# Patient Record
Sex: Female | Born: 1945 | Race: White | Hispanic: No | Marital: Married | Smoking: Never smoker
Health system: Southern US, Community
[De-identification: ages and names within clinical notes are randomized; demographics above are authoritative.]

## PROBLEM LIST (undated history)

## (undated) DIAGNOSIS — M1990 Unspecified osteoarthritis, unspecified site: Secondary | ICD-10-CM

## (undated) DIAGNOSIS — C801 Malignant (primary) neoplasm, unspecified: Secondary | ICD-10-CM

## (undated) DIAGNOSIS — I6529 Occlusion and stenosis of unspecified carotid artery: Secondary | ICD-10-CM

## (undated) DIAGNOSIS — Z8489 Family history of other specified conditions: Secondary | ICD-10-CM

## (undated) DIAGNOSIS — G4733 Obstructive sleep apnea (adult) (pediatric): Secondary | ICD-10-CM

## (undated) DIAGNOSIS — E876 Hypokalemia: Secondary | ICD-10-CM

## (undated) DIAGNOSIS — I10 Essential (primary) hypertension: Secondary | ICD-10-CM

## (undated) DIAGNOSIS — E662 Morbid (severe) obesity with alveolar hypoventilation: Secondary | ICD-10-CM

## (undated) DIAGNOSIS — F419 Anxiety disorder, unspecified: Secondary | ICD-10-CM

## (undated) DIAGNOSIS — E119 Type 2 diabetes mellitus without complications: Secondary | ICD-10-CM

## (undated) DIAGNOSIS — S0990XA Unspecified injury of head, initial encounter: Secondary | ICD-10-CM

## (undated) DIAGNOSIS — I509 Heart failure, unspecified: Secondary | ICD-10-CM

## (undated) DIAGNOSIS — R112 Nausea with vomiting, unspecified: Secondary | ICD-10-CM

## (undated) DIAGNOSIS — T8859XA Other complications of anesthesia, initial encounter: Secondary | ICD-10-CM

## (undated) DIAGNOSIS — M869 Osteomyelitis, unspecified: Secondary | ICD-10-CM

## (undated) DIAGNOSIS — I80209 Phlebitis and thrombophlebitis of unspecified deep vessels of unspecified lower extremity: Secondary | ICD-10-CM

## (undated) DIAGNOSIS — Z9289 Personal history of other medical treatment: Secondary | ICD-10-CM

## (undated) DIAGNOSIS — I272 Pulmonary hypertension, unspecified: Secondary | ICD-10-CM

## (undated) DIAGNOSIS — Z9889 Other specified postprocedural states: Secondary | ICD-10-CM

## (undated) DIAGNOSIS — T4145XA Adverse effect of unspecified anesthetic, initial encounter: Secondary | ICD-10-CM

## (undated) DIAGNOSIS — M4800 Spinal stenosis, site unspecified: Secondary | ICD-10-CM

## (undated) HISTORY — DX: Heart failure, unspecified: I50.9

## (undated) HISTORY — PX: COLONOSCOPY: SHX174

## (undated) HISTORY — DX: Obstructive sleep apnea (adult) (pediatric): G47.33

## (undated) HISTORY — DX: Occlusion and stenosis of unspecified carotid artery: I65.29

## (undated) HISTORY — DX: Essential (primary) hypertension: I10

## (undated) HISTORY — DX: Hypokalemia: E87.6

## (undated) HISTORY — DX: Pulmonary hypertension, unspecified: I27.20

## (undated) HISTORY — DX: Phlebitis and thrombophlebitis of unspecified deep vessels of unspecified lower extremity: I80.209

## (undated) HISTORY — DX: Morbid (severe) obesity due to excess calories: E66.01

## (undated) HISTORY — DX: Spinal stenosis, site unspecified: M48.00

## (undated) HISTORY — PX: EYE SURGERY: SHX253

## (undated) HISTORY — DX: Type 2 diabetes mellitus without complications: E11.9

## (undated) HISTORY — DX: Morbid (severe) obesity with alveolar hypoventilation: E66.2

## (undated) HISTORY — DX: Osteomyelitis, unspecified: M86.9

---

## 1981-07-20 HISTORY — PX: VAGINAL HYSTERECTOMY: SUR661

## 1998-01-23 ENCOUNTER — Other Ambulatory Visit: Admission: RE | Admit: 1998-01-23 | Discharge: 1998-01-23 | Payer: Self-pay | Admitting: Gynecology

## 1998-07-20 HISTORY — PX: OTHER SURGICAL HISTORY: SHX169

## 1998-09-18 ENCOUNTER — Encounter (HOSPITAL_COMMUNITY): Admission: RE | Admit: 1998-09-18 | Discharge: 1998-12-17 | Payer: Self-pay | Admitting: Internal Medicine

## 1998-11-06 ENCOUNTER — Inpatient Hospital Stay (HOSPITAL_COMMUNITY): Admission: EM | Admit: 1998-11-06 | Discharge: 1998-11-12 | Payer: Self-pay | Admitting: Gastroenterology

## 1998-11-06 ENCOUNTER — Encounter: Payer: Self-pay | Admitting: Gastroenterology

## 1999-02-21 ENCOUNTER — Encounter: Payer: Self-pay | Admitting: General Surgery

## 1999-02-21 ENCOUNTER — Ambulatory Visit (HOSPITAL_COMMUNITY): Admission: RE | Admit: 1999-02-21 | Discharge: 1999-02-21 | Payer: Self-pay | Admitting: General Surgery

## 1999-04-29 ENCOUNTER — Other Ambulatory Visit: Admission: RE | Admit: 1999-04-29 | Discharge: 1999-04-29 | Payer: Self-pay | Admitting: Gynecology

## 1999-04-29 ENCOUNTER — Encounter (INDEPENDENT_AMBULATORY_CARE_PROVIDER_SITE_OTHER): Payer: Self-pay | Admitting: Specialist

## 1999-06-19 ENCOUNTER — Encounter: Admission: RE | Admit: 1999-06-19 | Discharge: 1999-06-19 | Payer: Self-pay | Admitting: Gynecology

## 1999-06-19 ENCOUNTER — Encounter: Payer: Self-pay | Admitting: Gynecology

## 1999-06-23 ENCOUNTER — Encounter: Payer: Self-pay | Admitting: Gynecology

## 1999-06-23 ENCOUNTER — Encounter: Admission: RE | Admit: 1999-06-23 | Discharge: 1999-06-23 | Payer: Self-pay | Admitting: Gynecology

## 1999-07-10 ENCOUNTER — Encounter: Admission: RE | Admit: 1999-07-10 | Discharge: 1999-07-10 | Payer: Self-pay | Admitting: Hematology and Oncology

## 1999-07-10 ENCOUNTER — Encounter: Payer: Self-pay | Admitting: Hematology and Oncology

## 1999-07-17 ENCOUNTER — Ambulatory Visit (HOSPITAL_COMMUNITY): Admission: RE | Admit: 1999-07-17 | Discharge: 1999-07-17 | Payer: Self-pay | Admitting: General Surgery

## 1999-08-04 ENCOUNTER — Encounter: Admission: RE | Admit: 1999-08-04 | Discharge: 1999-08-04 | Payer: Self-pay | Admitting: General Surgery

## 1999-08-04 ENCOUNTER — Encounter: Payer: Self-pay | Admitting: General Surgery

## 1999-12-03 ENCOUNTER — Encounter: Admission: RE | Admit: 1999-12-03 | Discharge: 2000-03-02 | Payer: Self-pay | Admitting: Cardiology

## 2000-02-02 ENCOUNTER — Ambulatory Visit (HOSPITAL_COMMUNITY): Admission: RE | Admit: 2000-02-02 | Discharge: 2000-02-02 | Payer: Self-pay | Admitting: Gastroenterology

## 2000-03-31 ENCOUNTER — Encounter: Payer: Self-pay | Admitting: Hematology and Oncology

## 2000-03-31 ENCOUNTER — Encounter: Admission: RE | Admit: 2000-03-31 | Discharge: 2000-03-31 | Payer: Self-pay | Admitting: Hematology and Oncology

## 2000-04-29 ENCOUNTER — Other Ambulatory Visit: Admission: RE | Admit: 2000-04-29 | Discharge: 2000-04-29 | Payer: Self-pay | Admitting: Gynecology

## 2000-06-23 ENCOUNTER — Encounter: Payer: Self-pay | Admitting: Gynecology

## 2000-06-23 ENCOUNTER — Encounter: Admission: RE | Admit: 2000-06-23 | Discharge: 2000-06-23 | Payer: Self-pay | Admitting: Gynecology

## 2000-07-20 HISTORY — PX: CHOLECYSTECTOMY: SHX55

## 2000-09-14 ENCOUNTER — Encounter: Admission: RE | Admit: 2000-09-14 | Discharge: 2000-09-14 | Payer: Self-pay | Admitting: Hematology and Oncology

## 2000-09-14 ENCOUNTER — Encounter: Payer: Self-pay | Admitting: Hematology and Oncology

## 2001-04-04 ENCOUNTER — Ambulatory Visit (HOSPITAL_COMMUNITY): Admission: RE | Admit: 2001-04-04 | Discharge: 2001-04-04 | Payer: Self-pay | Admitting: Gastroenterology

## 2001-04-04 ENCOUNTER — Encounter: Payer: Self-pay | Admitting: Gastroenterology

## 2001-04-07 ENCOUNTER — Encounter (INDEPENDENT_AMBULATORY_CARE_PROVIDER_SITE_OTHER): Payer: Self-pay | Admitting: Specialist

## 2001-04-07 ENCOUNTER — Encounter: Payer: Self-pay | Admitting: Gastroenterology

## 2001-04-08 ENCOUNTER — Inpatient Hospital Stay (HOSPITAL_COMMUNITY): Admission: AD | Admit: 2001-04-08 | Discharge: 2001-04-11 | Payer: Self-pay | Admitting: Gastroenterology

## 2001-04-08 ENCOUNTER — Ambulatory Visit (HOSPITAL_COMMUNITY): Admission: RE | Admit: 2001-04-08 | Discharge: 2001-04-08 | Payer: Self-pay | Admitting: General Surgery

## 2001-04-08 ENCOUNTER — Encounter: Payer: Self-pay | Admitting: General Surgery

## 2001-06-08 ENCOUNTER — Other Ambulatory Visit: Admission: RE | Admit: 2001-06-08 | Discharge: 2001-06-08 | Payer: Self-pay | Admitting: Gynecology

## 2001-06-27 ENCOUNTER — Encounter: Admission: RE | Admit: 2001-06-27 | Discharge: 2001-06-27 | Payer: Self-pay | Admitting: Gynecology

## 2001-06-27 ENCOUNTER — Encounter: Payer: Self-pay | Admitting: Gynecology

## 2002-06-13 ENCOUNTER — Other Ambulatory Visit: Admission: RE | Admit: 2002-06-13 | Discharge: 2002-06-13 | Payer: Self-pay | Admitting: Gynecology

## 2002-06-28 ENCOUNTER — Encounter: Payer: Self-pay | Admitting: Gynecology

## 2002-06-28 ENCOUNTER — Encounter: Admission: RE | Admit: 2002-06-28 | Discharge: 2002-06-28 | Payer: Self-pay | Admitting: Gynecology

## 2003-03-16 ENCOUNTER — Ambulatory Visit (HOSPITAL_COMMUNITY): Admission: RE | Admit: 2003-03-16 | Discharge: 2003-03-16 | Payer: Self-pay | Admitting: Gastroenterology

## 2003-07-02 ENCOUNTER — Encounter: Admission: RE | Admit: 2003-07-02 | Discharge: 2003-07-02 | Payer: Self-pay | Admitting: Gynecology

## 2003-07-10 ENCOUNTER — Encounter: Admission: RE | Admit: 2003-07-10 | Discharge: 2003-07-10 | Payer: Self-pay | Admitting: Gynecology

## 2003-12-20 ENCOUNTER — Encounter: Admission: RE | Admit: 2003-12-20 | Discharge: 2003-12-20 | Payer: Self-pay | Admitting: Gynecology

## 2004-06-25 ENCOUNTER — Other Ambulatory Visit: Admission: RE | Admit: 2004-06-25 | Discharge: 2004-06-25 | Payer: Self-pay | Admitting: Gynecology

## 2004-07-15 ENCOUNTER — Encounter: Admission: RE | Admit: 2004-07-15 | Discharge: 2004-07-15 | Payer: Self-pay | Admitting: Gynecology

## 2004-10-14 ENCOUNTER — Encounter: Admission: RE | Admit: 2004-10-14 | Discharge: 2005-01-12 | Payer: Self-pay | Admitting: Family Medicine

## 2005-08-25 ENCOUNTER — Encounter: Admission: RE | Admit: 2005-08-25 | Discharge: 2005-08-25 | Payer: Self-pay | Admitting: Gynecology

## 2006-06-30 ENCOUNTER — Ambulatory Visit (HOSPITAL_BASED_OUTPATIENT_CLINIC_OR_DEPARTMENT_OTHER): Admission: RE | Admit: 2006-06-30 | Discharge: 2006-06-30 | Payer: Self-pay | Admitting: Family Medicine

## 2006-08-26 ENCOUNTER — Encounter: Admission: RE | Admit: 2006-08-26 | Discharge: 2006-08-26 | Payer: Self-pay | Admitting: Gynecology

## 2006-09-15 ENCOUNTER — Ambulatory Visit: Payer: Self-pay | Admitting: Pulmonary Disease

## 2006-10-06 ENCOUNTER — Ambulatory Visit: Payer: Self-pay | Admitting: Pulmonary Disease

## 2006-10-06 ENCOUNTER — Ambulatory Visit: Admission: RE | Admit: 2006-10-06 | Discharge: 2006-10-06 | Payer: Self-pay | Admitting: Pulmonary Disease

## 2006-10-06 LAB — CONVERTED CEMR LAB
Calcium: 9.4 mg/dL (ref 8.4–10.5)
Chloride: 99 meq/L (ref 96–112)
GFR calc non Af Amer: 48 mL/min
Glucose, Bld: 299 mg/dL — ABNORMAL HIGH (ref 70–99)

## 2006-10-19 ENCOUNTER — Ambulatory Visit: Admission: RE | Admit: 2006-10-19 | Discharge: 2006-10-19 | Payer: Self-pay | Admitting: Pulmonary Disease

## 2006-10-19 ENCOUNTER — Encounter (INDEPENDENT_AMBULATORY_CARE_PROVIDER_SITE_OTHER): Payer: Self-pay | Admitting: Cardiology

## 2006-12-14 ENCOUNTER — Ambulatory Visit: Payer: Self-pay | Admitting: Pulmonary Disease

## 2006-12-16 ENCOUNTER — Ambulatory Visit: Payer: Self-pay | Admitting: Internal Medicine

## 2006-12-17 ENCOUNTER — Ambulatory Visit: Payer: Self-pay | Admitting: Internal Medicine

## 2006-12-17 LAB — CONVERTED CEMR LAB
Basophils Relative: 0.1 % (ref 0.0–1.0)
CO2: 36 meq/L — ABNORMAL HIGH (ref 19–32)
Chloride: 109 meq/L (ref 96–112)
Creatinine, Ser: 0.9 mg/dL (ref 0.4–1.2)
Eosinophils Relative: 4.1 % (ref 0.0–5.0)
Glucose, Bld: 83 mg/dL (ref 70–99)
HCT: 38.3 % (ref 36.0–46.0)
INR: 1.2 (ref 0.9–2.0)
MCV: 87.8 fL (ref 78.0–100.0)
Neutrophils Relative %: 69.9 % (ref 43.0–77.0)
RBC: 4.36 M/uL (ref 3.87–5.11)
RDW: 15.9 % — ABNORMAL HIGH (ref 11.5–14.6)
Sodium: 149 meq/L — ABNORMAL HIGH (ref 135–145)
WBC: 10.2 10*3/uL (ref 4.5–10.5)

## 2006-12-20 ENCOUNTER — Ambulatory Visit: Payer: Self-pay | Admitting: Internal Medicine

## 2006-12-20 ENCOUNTER — Inpatient Hospital Stay (HOSPITAL_BASED_OUTPATIENT_CLINIC_OR_DEPARTMENT_OTHER): Admission: RE | Admit: 2006-12-20 | Discharge: 2006-12-20 | Payer: Self-pay | Admitting: Cardiology

## 2007-01-04 ENCOUNTER — Ambulatory Visit: Payer: Self-pay | Admitting: Cardiology

## 2007-02-08 ENCOUNTER — Ambulatory Visit: Payer: Self-pay | Admitting: Pulmonary Disease

## 2007-02-08 LAB — CONVERTED CEMR LAB
Chloride: 97 meq/L (ref 96–112)
Creatinine, Ser: 1 mg/dL (ref 0.4–1.2)
Glucose, Bld: 106 mg/dL — ABNORMAL HIGH (ref 70–99)
Sodium: 138 meq/L (ref 135–145)

## 2007-04-05 ENCOUNTER — Ambulatory Visit: Payer: Self-pay | Admitting: Internal Medicine

## 2007-04-25 ENCOUNTER — Ambulatory Visit: Payer: Self-pay

## 2007-04-25 ENCOUNTER — Encounter: Payer: Self-pay | Admitting: Internal Medicine

## 2007-05-02 ENCOUNTER — Ambulatory Visit: Payer: Self-pay | Admitting: Pulmonary Disease

## 2007-05-02 LAB — CONVERTED CEMR LAB
BUN: 28 mg/dL — ABNORMAL HIGH (ref 6–23)
CO2: 30 meq/L (ref 19–32)
Chloride: 98 meq/L (ref 96–112)
Creatinine, Ser: 1.2 mg/dL (ref 0.4–1.2)
Potassium: 3.3 meq/L — ABNORMAL LOW (ref 3.5–5.1)
Sodium: 139 meq/L (ref 135–145)

## 2007-05-05 DIAGNOSIS — I82409 Acute embolism and thrombosis of unspecified deep veins of unspecified lower extremity: Secondary | ICD-10-CM | POA: Insufficient documentation

## 2007-05-05 DIAGNOSIS — G4733 Obstructive sleep apnea (adult) (pediatric): Secondary | ICD-10-CM

## 2007-05-05 DIAGNOSIS — I509 Heart failure, unspecified: Secondary | ICD-10-CM | POA: Insufficient documentation

## 2007-05-05 DIAGNOSIS — I10 Essential (primary) hypertension: Secondary | ICD-10-CM | POA: Insufficient documentation

## 2007-05-05 DIAGNOSIS — E119 Type 2 diabetes mellitus without complications: Secondary | ICD-10-CM | POA: Insufficient documentation

## 2007-06-06 ENCOUNTER — Ambulatory Visit: Payer: Self-pay | Admitting: Cardiovascular Disease

## 2007-07-21 HISTORY — PX: CARPAL TUNNEL RELEASE: SHX101

## 2007-08-10 ENCOUNTER — Ambulatory Visit (HOSPITAL_BASED_OUTPATIENT_CLINIC_OR_DEPARTMENT_OTHER): Admission: RE | Admit: 2007-08-10 | Discharge: 2007-08-10 | Payer: Self-pay | Admitting: *Deleted

## 2007-08-29 ENCOUNTER — Encounter: Admission: RE | Admit: 2007-08-29 | Discharge: 2007-08-29 | Payer: Self-pay | Admitting: Gynecology

## 2007-09-13 ENCOUNTER — Ambulatory Visit: Payer: Self-pay | Admitting: Internal Medicine

## 2007-09-28 ENCOUNTER — Ambulatory Visit: Payer: Self-pay

## 2007-09-28 ENCOUNTER — Ambulatory Visit: Payer: Self-pay | Admitting: Internal Medicine

## 2007-09-28 ENCOUNTER — Encounter: Payer: Self-pay | Admitting: Internal Medicine

## 2007-09-28 LAB — CONVERTED CEMR LAB
CO2: 29 meq/L (ref 19–32)
Calcium: 9.8 mg/dL (ref 8.4–10.5)
Creatinine, Ser: 1.5 mg/dL — ABNORMAL HIGH (ref 0.4–1.2)
GFR calc Af Amer: 45 mL/min
Glucose, Bld: 154 mg/dL — ABNORMAL HIGH (ref 70–99)

## 2007-10-20 ENCOUNTER — Ambulatory Visit: Payer: Self-pay | Admitting: Internal Medicine

## 2007-10-20 LAB — CONVERTED CEMR LAB
BUN: 41 mg/dL — ABNORMAL HIGH (ref 6–23)
Chloride: 100 meq/L (ref 96–112)
Creatinine, Ser: 1.5 mg/dL — ABNORMAL HIGH (ref 0.4–1.2)
GFR calc non Af Amer: 37 mL/min
Glucose, Bld: 139 mg/dL — ABNORMAL HIGH (ref 70–99)

## 2007-10-31 ENCOUNTER — Ambulatory Visit: Payer: Self-pay | Admitting: Pulmonary Disease

## 2007-10-31 DIAGNOSIS — E662 Morbid (severe) obesity with alveolar hypoventilation: Secondary | ICD-10-CM

## 2007-11-24 ENCOUNTER — Ambulatory Visit: Payer: Self-pay | Admitting: Internal Medicine

## 2007-11-24 ENCOUNTER — Ambulatory Visit: Payer: Self-pay

## 2007-11-24 LAB — CONVERTED CEMR LAB
BUN: 25 mg/dL — ABNORMAL HIGH (ref 6–23)
CO2: 31 meq/L (ref 19–32)
Calcium: 9.6 mg/dL (ref 8.4–10.5)
Creatinine, Ser: 1.1 mg/dL (ref 0.4–1.2)
Glucose, Bld: 151 mg/dL — ABNORMAL HIGH (ref 70–99)

## 2007-12-05 ENCOUNTER — Ambulatory Visit: Payer: Self-pay | Admitting: Internal Medicine

## 2007-12-05 LAB — CONVERTED CEMR LAB
BUN: 20 mg/dL (ref 6–23)
CO2: 32 meq/L (ref 19–32)
Calcium: 9.3 mg/dL (ref 8.4–10.5)
Creatinine, Ser: 1 mg/dL (ref 0.4–1.2)
GFR calc Af Amer: 72 mL/min
Glucose, Bld: 198 mg/dL — ABNORMAL HIGH (ref 70–99)
Sodium: 144 meq/L (ref 135–145)

## 2007-12-06 ENCOUNTER — Encounter: Payer: Self-pay | Admitting: Pulmonary Disease

## 2007-12-30 ENCOUNTER — Ambulatory Visit: Payer: Self-pay | Admitting: Internal Medicine

## 2007-12-30 LAB — CONVERTED CEMR LAB
BUN: 25 mg/dL — ABNORMAL HIGH (ref 6–23)
Calcium: 9.3 mg/dL (ref 8.4–10.5)
Creatinine, Ser: 0.9 mg/dL (ref 0.4–1.2)
GFR calc Af Amer: 81 mL/min
GFR calc non Af Amer: 67 mL/min
Glucose, Bld: 102 mg/dL — ABNORMAL HIGH (ref 70–99)

## 2008-02-28 ENCOUNTER — Ambulatory Visit: Payer: Self-pay | Admitting: Pulmonary Disease

## 2008-03-05 LAB — CONVERTED CEMR LAB
BUN: 23 mg/dL (ref 6–23)
Calcium: 9.7 mg/dL (ref 8.4–10.5)
Creatinine, Ser: 1.1 mg/dL (ref 0.4–1.2)
GFR calc non Af Amer: 53 mL/min

## 2008-03-29 ENCOUNTER — Ambulatory Visit: Payer: Self-pay | Admitting: Internal Medicine

## 2008-04-09 ENCOUNTER — Ambulatory Visit: Payer: Self-pay | Admitting: Internal Medicine

## 2008-04-09 LAB — CONVERTED CEMR LAB
CO2: 32 meq/L (ref 19–32)
Calcium: 10 mg/dL (ref 8.4–10.5)
Creatinine, Ser: 1 mg/dL (ref 0.4–1.2)
GFR calc Af Amer: 72 mL/min
Glucose, Bld: 160 mg/dL — ABNORMAL HIGH (ref 70–99)
Sodium: 142 meq/L (ref 135–145)

## 2008-04-12 ENCOUNTER — Telehealth: Payer: Self-pay | Admitting: Pulmonary Disease

## 2008-04-18 ENCOUNTER — Encounter: Payer: Self-pay | Admitting: Pulmonary Disease

## 2008-04-18 ENCOUNTER — Ambulatory Visit: Payer: Self-pay | Admitting: Internal Medicine

## 2008-04-18 LAB — CONVERTED CEMR LAB
BUN: 29 mg/dL — ABNORMAL HIGH (ref 6–23)
Calcium: 9.2 mg/dL (ref 8.4–10.5)
GFR calc Af Amer: 72 mL/min
GFR calc non Af Amer: 60 mL/min
Glucose, Bld: 197 mg/dL — ABNORMAL HIGH (ref 70–99)
Potassium: 4 meq/L (ref 3.5–5.1)
Sodium: 140 meq/L (ref 135–145)

## 2008-05-01 ENCOUNTER — Ambulatory Visit (HOSPITAL_COMMUNITY): Admission: RE | Admit: 2008-05-01 | Discharge: 2008-05-01 | Payer: Self-pay | Admitting: Gastroenterology

## 2008-08-28 ENCOUNTER — Ambulatory Visit: Payer: Self-pay | Admitting: Pulmonary Disease

## 2008-08-29 ENCOUNTER — Encounter: Admission: RE | Admit: 2008-08-29 | Discharge: 2008-08-29 | Payer: Self-pay | Admitting: Family Medicine

## 2008-10-29 ENCOUNTER — Telehealth (INDEPENDENT_AMBULATORY_CARE_PROVIDER_SITE_OTHER): Payer: Self-pay | Admitting: *Deleted

## 2008-10-31 ENCOUNTER — Telehealth: Payer: Self-pay | Admitting: Internal Medicine

## 2008-11-02 ENCOUNTER — Telehealth (INDEPENDENT_AMBULATORY_CARE_PROVIDER_SITE_OTHER): Payer: Self-pay | Admitting: *Deleted

## 2008-12-05 ENCOUNTER — Telehealth: Payer: Self-pay | Admitting: Internal Medicine

## 2008-12-31 ENCOUNTER — Ambulatory Visit: Payer: Self-pay | Admitting: Internal Medicine

## 2008-12-31 DIAGNOSIS — E7800 Pure hypercholesterolemia, unspecified: Secondary | ICD-10-CM

## 2009-01-01 LAB — CONVERTED CEMR LAB
ALT: 24 units/L (ref 0–35)
AST: 24 units/L (ref 0–37)
Albumin: 3.5 g/dL (ref 3.5–5.2)
Alkaline Phosphatase: 52 units/L (ref 39–117)
BUN: 31 mg/dL — ABNORMAL HIGH (ref 6–23)
Bilirubin, Direct: 0.1 mg/dL (ref 0.0–0.3)
Chloride: 111 meq/L (ref 96–112)
Cholesterol: 142 mg/dL (ref 0–200)
Creatinine, Ser: 1.1 mg/dL (ref 0.4–1.2)
GFR calc non Af Amer: 53.08 mL/min (ref >60)
Total Protein: 6.9 g/dL (ref 6.0–8.3)
Triglycerides: 89 mg/dL (ref 0.0–149.0)

## 2009-01-02 ENCOUNTER — Encounter: Payer: Self-pay | Admitting: Internal Medicine

## 2009-01-09 ENCOUNTER — Ambulatory Visit: Payer: Self-pay | Admitting: Internal Medicine

## 2009-01-09 DIAGNOSIS — R0989 Other specified symptoms and signs involving the circulatory and respiratory systems: Secondary | ICD-10-CM

## 2009-01-09 DIAGNOSIS — I428 Other cardiomyopathies: Secondary | ICD-10-CM

## 2009-01-09 DIAGNOSIS — I2789 Other specified pulmonary heart diseases: Secondary | ICD-10-CM

## 2009-01-14 ENCOUNTER — Encounter: Payer: Self-pay | Admitting: Internal Medicine

## 2009-01-14 ENCOUNTER — Ambulatory Visit: Payer: Self-pay

## 2009-03-14 ENCOUNTER — Ambulatory Visit (HOSPITAL_COMMUNITY): Admission: RE | Admit: 2009-03-14 | Discharge: 2009-03-14 | Payer: Self-pay | Admitting: Cardiology

## 2009-05-25 ENCOUNTER — Encounter: Payer: Self-pay | Admitting: Pulmonary Disease

## 2009-06-10 ENCOUNTER — Ambulatory Visit: Payer: Self-pay | Admitting: Pulmonary Disease

## 2009-07-17 ENCOUNTER — Encounter: Payer: Self-pay | Admitting: Internal Medicine

## 2009-09-02 ENCOUNTER — Encounter: Admission: RE | Admit: 2009-09-02 | Discharge: 2009-09-02 | Payer: Self-pay | Admitting: Family Medicine

## 2009-09-07 ENCOUNTER — Encounter: Admission: RE | Admit: 2009-09-07 | Discharge: 2009-09-07 | Payer: Self-pay | Admitting: Family Medicine

## 2009-09-16 ENCOUNTER — Encounter: Payer: Self-pay | Admitting: Internal Medicine

## 2009-11-14 ENCOUNTER — Telehealth: Payer: Self-pay | Admitting: Pulmonary Disease

## 2009-11-21 ENCOUNTER — Encounter: Payer: Self-pay | Admitting: Pulmonary Disease

## 2009-11-28 ENCOUNTER — Telehealth: Payer: Self-pay | Admitting: Pulmonary Disease

## 2009-11-28 ENCOUNTER — Telehealth (INDEPENDENT_AMBULATORY_CARE_PROVIDER_SITE_OTHER): Payer: Self-pay | Admitting: *Deleted

## 2009-12-09 ENCOUNTER — Ambulatory Visit: Payer: Self-pay | Admitting: Pulmonary Disease

## 2009-12-24 ENCOUNTER — Ambulatory Visit: Payer: Self-pay | Admitting: Internal Medicine

## 2009-12-24 DIAGNOSIS — I6529 Occlusion and stenosis of unspecified carotid artery: Secondary | ICD-10-CM

## 2009-12-29 ENCOUNTER — Telehealth: Payer: Self-pay | Admitting: Nurse Practitioner

## 2009-12-29 ENCOUNTER — Emergency Department (HOSPITAL_COMMUNITY): Admission: EM | Admit: 2009-12-29 | Discharge: 2009-12-29 | Payer: Self-pay | Admitting: Emergency Medicine

## 2010-01-06 ENCOUNTER — Encounter: Payer: Self-pay | Admitting: Internal Medicine

## 2010-01-10 ENCOUNTER — Ambulatory Visit: Payer: Self-pay

## 2010-01-10 ENCOUNTER — Encounter: Payer: Self-pay | Admitting: Internal Medicine

## 2010-01-11 ENCOUNTER — Encounter: Payer: Self-pay | Admitting: Pulmonary Disease

## 2010-02-21 ENCOUNTER — Telehealth: Payer: Self-pay | Admitting: Internal Medicine

## 2010-03-13 ENCOUNTER — Encounter: Payer: Self-pay | Admitting: Internal Medicine

## 2010-03-13 ENCOUNTER — Telehealth: Payer: Self-pay | Admitting: Internal Medicine

## 2010-03-14 ENCOUNTER — Ambulatory Visit: Payer: Self-pay | Admitting: Cardiology

## 2010-03-14 DIAGNOSIS — I499 Cardiac arrhythmia, unspecified: Secondary | ICD-10-CM | POA: Insufficient documentation

## 2010-03-17 ENCOUNTER — Telehealth: Payer: Self-pay | Admitting: Internal Medicine

## 2010-03-26 ENCOUNTER — Encounter: Payer: Self-pay | Admitting: Internal Medicine

## 2010-03-26 ENCOUNTER — Ambulatory Visit: Payer: Self-pay | Admitting: Cardiology

## 2010-04-22 ENCOUNTER — Ambulatory Visit: Payer: Self-pay | Admitting: Internal Medicine

## 2010-04-22 DIAGNOSIS — R002 Palpitations: Secondary | ICD-10-CM

## 2010-06-05 ENCOUNTER — Telehealth (INDEPENDENT_AMBULATORY_CARE_PROVIDER_SITE_OTHER): Payer: Self-pay | Admitting: *Deleted

## 2010-07-23 ENCOUNTER — Ambulatory Visit
Admission: RE | Admit: 2010-07-23 | Discharge: 2010-07-23 | Payer: Self-pay | Source: Home / Self Care | Attending: Pulmonary Disease | Admitting: Pulmonary Disease

## 2010-08-09 ENCOUNTER — Encounter: Payer: Self-pay | Admitting: Gynecology

## 2010-08-19 ENCOUNTER — Other Ambulatory Visit: Payer: Self-pay | Admitting: Family Medicine

## 2010-08-19 DIAGNOSIS — Z1239 Encounter for other screening for malignant neoplasm of breast: Secondary | ICD-10-CM

## 2010-08-19 NOTE — Progress Notes (Signed)
Summary: calling regarding a heart monitor  Phone Note Call from Patient Call back at Home Phone [phone]   Caller: Patient Summary of Call: Pt sister calling about getting heart monitor Initial call taken by: Judie Grieve,  March 17, 2010 10:54 AM  Follow-up for Phone Call        spoke w/pt she is concerned b/c she hasn't heard about getting monitor and she is going out of town on [place], Windell Moulding contacted she will call pt and arrange Meredith Staggers, RN  March 17, 2010 4:07 PM

## 2010-08-19 NOTE — Consult Note (Signed)
Summary: [facility] Neurosurgical Consult   [facility] Neurosurgical Consult   Imported By: Roderic Ovens 02/12/2010 09:32:16  _____________________________________________________________________  External Attachment:    Type:   Image     Comment:   External Document

## 2010-08-19 NOTE — Assessment & Plan Note (Signed)
Summary: rov for ohs   Primary Provider/Referring Provider:  Aida Puffer  CC:  f/u 6 mth.-c/o nasal congestion, cough-white, green in am after waking, no sob or wheezing, no fcs, uses bipap, and wears mask-doing well.  History of Present Illness: the pt comes in today for f/u of her OHS.  She is wearing bipap compliantly, and having no issues with mask fit or pressure.  Her only complaint is that of nasal dryness with thick nonpurulent mucus in the am's.  It turns out she never got a heated humidifier for her bipap machine!!  She feels that she is sleeping well, and has adequate alertness during the day.  She has been keeping her LE edema under control, and is trying to work on weight loss.    Current Medications (verified): 1)  Januvia 100 Mg  Tabs (Sitagliptin Phosphate) .... Take One Tab By Mouth Once Daily 2)  Lorazepam 1 Mg  Tabs (Lorazepam) .... Take One Tab By Mouth Two Times A Day 3)  Coreg 3.125 Mg  Tabs (Carvedilol) .... Take One Tab By Mouth Two Times A Day 4)  Aceon 8 Mg  Tabs (Perindopril Erbumine) .... Take One Tab By Mouth Once Daily 5)  Zoloft 100 Mg  Tabs (Sertraline Hcl) .... Take 1/2 Tab By Mouth Once Daily 6)  Bayer Aspirin 325 Mg  Tabs (Aspirin) .... Take One Tab By Mouth Once Daily 7)  Lantus 100 Unit/ml  Soln (Insulin Glargine) .... Use As Directed 8)  Furosemide 40 Mg  Tabs (Furosemide) .... Take One Tab By Mouth Once Daily 9)  Apidra Opticlik 100 Unit/ml [place] Soln (Insulin Glulisine) .... Two Times A Day 10)  Hydrochlorothiazide 25 Mg Tabs (Hydrochlorothiazide) .... Take 1 Tablet By Mouth Once A Day 11)  Spironolactone 25 Mg Tabs (Spironolactone) .... Take 1 Tablet By Mouth Once A Day 12)  Simvastatin 20 Mg Tabs (Simvastatin) .... Take One Tablet By Mouth Daily At Bedtime 13)  Vitamin D (Ergocalciferol) 50000 Unit Caps (Ergocalciferol) .... Take 2 Capsules Weekly 14)  Cyanocobalamin 1000 Mcg/ml Soln (Cyanocobalamin) .... Monthly 15)  Ferrous Sulfate 83 Mg Tabs  (Ferrous Sulfate) .... Take 1 Tablet Two Times A Day By Mouth  Allergies (verified): 1)  ! Glucophage 2)  Demerol 3)  Codeine  Past History:  Family History: Last updated: 10/25/2008 Family History of Coronary Artery Disease: father   Social History: Last updated: 10/25/2008 Disabled  Married, has 3 children Tobacco Use - No.   Risk Factors: Smoking Status: never (10/25/2008)  Review of Systems      See HPI  Vital Signs:  Patient profile:   65 year old female Height:      63 inches Weight:      276 pounds O2 Sat:      93 % on Room air Temp:     97.9 degrees F oral Pulse rate:   84 / minute BP sitting:   114 / 60  (left arm) Cuff size:   large  Vitals Entered By: Elray Buba RN (Dec 09, 2009 10:22 AM)  O2 Flow:  Room air CC: f/u 6 mth.-c/o nasal congestion,cough-white,green in am after waking,no sob or wheezing, no fcs,uses bipap,wears mask-doing well Is Patient Diabetic? Yes Comments Medications reviewed with patient  Elray Buba RN  Dec 09, 2009 10:22 AM    Physical Exam  General:  obese female in nad Nose:  no skin breakdown or pressure necrosis from cpap mask Lungs:  totally clear to auscultation Heart:  rrr Extremities:  no edema or cyanosis varicosities present Neurologic:  alert and not sleepy, moves all 4.   Impression & Recommendations:  Problem # 1:  OBESITY HYPOVENTILATION SYNDROME (ICD-278.8) the patient is doing about as well as can be expected with her obesity hypoventilation syndrome. She is wearing BiPAP compliantly, and feels that she sleeps well with the device. She does need a heated humidifier however, and this will help dramatically with her nasal dryness. I have asked her to continue working aggressively on weight reduction, and to keep track of her fluid balance very closely. She will followup with me in 6 months or sooner if there are issues.  Medications Added to Medication List This Visit: 1)  Vitamin D (ergocalciferol) 50000  Unit Caps (Ergocalciferol) .... Take 2 capsules weekly 2)  Ferrous Sulfate 83 Mg Tabs (Ferrous sulfate) .... Take 1 tablet two times a day by mouth  Other Orders: Est. Patient Level III (16109) DME Referral (DME)  Patient Instructions: 1)  will get you a heated humidifier for your bipap machine 2)  continue to work on weight loss 3)  followup with me in 6mos.   Immunization History:  Influenza Immunization History:    Influenza:  historical (04/19/2009)

## 2010-08-19 NOTE — Progress Notes (Signed)
Summary: talo to nurse  Phone Note From Other Clinic   Summary of Call: Per April from Dr Aida Puffer ofc. pt came in ofc today c/o syncope/ dizziness/abn ekg/low BP. Wants to see if pt can be seen sooner that 9/7. ofc  [phone]  Initial call taken by: Edman Circle,  March 13, 2010 1:27 PM  Follow-up for Phone Call        Spoke with April at Dr. Fredirick Maudlin office who said that Dr. Clarene Duke felt the pt needed to see Dr. Gala Romney earlier than her next appt. Office is going to fax the EKG and office note from the patient's appt. to the office for [place] to review before making an earlier appt. Whitney Maeola Sarah RN  March 13, 2010 3:34 PM  Received the fax from Dr. Fredirick Maudlin office which shows the pt. is in a new onset of afib and is currently not on any blood thinner. Appt. made with Dr. Antoine Poche tomorrow (D0D) so patient could be seen asap.  Whitney Maeola Sarah RN  March 13, 2010 3:38 PM

## 2010-08-19 NOTE — Assessment & Plan Note (Signed)
Summary: yearly/sl   Primary Provider:  Aida [name]  CC:  yearly. Pt has many changes she wants to speak with physician about.  History of Present Illness: [place] is a delightful 65 year old with history of morbid obesity complicated by obesity hypoventilation syndrome, hypertension, sleep apnea, diabetes, and mild secondary pulmonary hypertension as well as hypokalemia.   Initially, she had an echocardiogram read as severe LV dysfunction with an EF of 15-20%.  She underwent catheterization in 2008 that showed minimal nonobstructive coronary artery disease with EF 55%.  She had mild pulmonary hypertension with both pulmonary venous and pulmonary arterial components.  Her PVR was 5.1 Wood units. Echo in 3/09 EF 60%.  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 Also found to have spinal stenosis. Has problems walking and standing up. Better if she leans forward. Has cut calorie intake down but not losing weight. Unable to exercise due to back.   Doing well from a cardio-pulmonary standpoint. Using BiPAP regularly. Denies CP or SOB. No edema.   Cardiovascular Risk History:      Positive major cardiovascular risk factors include female age 69 years old or older, diabetes, hyperlipidemia, and hypertension.  Negative major cardiovascular risk factors include non-tobacco-user status.        Positive history for target organ damage include cardiac end organ damage (either CHF or LVH).     Current Medications (verified): 1)  Januvia 100 Mg  Tabs (Sitagliptin Phosphate) .... Take One Tab By Mouth Once Daily 2)  Lorazepam 1 Mg  Tabs (Lorazepam) .... Take One Tab By Mouth Two Times A Day 3)  Coreg 3.125 Mg  Tabs (Carvedilol) .... Take One Tab By Mouth Two Times A Day 4)  Aceon 8 Mg  Tabs (Perindopril Erbumine) .... Take One Tab By Mouth Once Daily 5)  Zoloft 100 Mg  Tabs (Sertraline Hcl) .... Take 1/2 Tab By Mouth Once Daily 6)  Bayer Aspirin 325 Mg  Tabs (Aspirin) .... Take One Tab By Mouth  Once Daily 7)  Lantus 100 Unit/ml  Soln (Insulin Glargine) .... Use As Directed 8)  Furosemide 40 Mg  Tabs (Furosemide) .... Take One Tab By Mouth Once Daily 9)  Apidra Opticlik 100 Unit/ml [place] Soln (Insulin Glulisine) .... Two Times A Day 10)  Hydrochlorothiazide 25 Mg Tabs (Hydrochlorothiazide) .... Take 1 Tablet By Mouth Once A Day 11)  Spironolactone 25 Mg Tabs (Spironolactone) .... Take 1 Tablet By Mouth Once A Day 12)  Simvastatin 20 Mg Tabs (Simvastatin) .... Take One Tablet By Mouth Daily At Bedtime 13)  Vitamin D (Ergocalciferol) 50000 Unit Caps (Ergocalciferol) .... Take 2 Capsules Weekly 14)  Cyanocobalamin 1000 Mcg/ml Soln (Cyanocobalamin) .... Monthly 15)  Ferrous Sulfate 83 Mg Tabs (Ferrous Sulfate) .... Take 1 Tablet Two Times A Day By Mouth  Allergies (verified): 1)  ! 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   Vital Signs:  Patient profile:   65 year old female Height:      63 inches Weight:      278 pounds BMI:     49.42 Pulse rate:   80 / minute Pulse rhythm:   regular BP sitting:   122 / 64  (left arm) Cuff size:   large  Vitals Entered By: Judithe Modest CMA (December 24, 2009 11:09 AM)  Physical Exam  General:  well appearing. no resp difficulty HEENT: normal Neck: supple thick. no obvious JVD. Carotids 2+  + bilat bruit R>L. No lymphadenopathy or thryomegaly appreciated. Cor: PMI nondisplaced. Regular rate  & rhythm. No rubs, gallops. 2/6 sem RSB and LSB Lungs: clear Abdomen: obese. soft, nontender, nondistended. No bruits or masses. Good bowel sounds. Extremities: no cyanosis, clubbing, rash, edema Neuro: alert & orientedx3, cranial nerves grossly intact. moves all 4 extremities w/o difficulty. 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 Compliant with BIPAP. Volume status looks good. Reiterated need for weight loss. If can't walk suggested water aerobices or stationary bike exercises. may need to consider physician supervised weight loss programs.  Problem # 2:  CAROTID ARTERY STENOSIS (ICD-433.10) Due for f/u ultrasound. Asymptomatic. Continue ASA and statin.  Problem # 3:  OBESITY HYPOVENTILATION SYNDROME (ICD-278.8) See pulm HTN section.  Problem # 4:  Spinal stenosis Will refer to Neurosurgery to evaluate.   Other Orders: EKG w/ Interpretation (93000) Carotid Duplex (Carotid Duplex) Orthopedic Referral (Ortho)  Cardiovascular Risk Assessment/Plan:      The patient's hypertensive risk group is category C: Target organ damage and/or diabetes.  Her calculated 10 year risk of coronary heart disease is 15 %.  Today's blood pressure is 122/64.    Patient Instructions: 1)  Your physician has requested that you have a carotid duplex. This test is an ultrasound of the carotid arteries in your neck. It looks at blood flow through these arteries that supply the brain with blood. Allow one hour for this exam. There are no restrictions or special instructions. 2)  You have been referred to [facility] 3)  Your physician wants you to follow-up in:   1 YEAR.  You will receive a reminder letter in the mail two months in advance. If you don't receive a letter, please call our office to schedule the follow-up appointment.  Appended Document: ORDERS    Clinical Lists Changes  Orders: Added new Referral order of Misc.  Referral (Misc. Ref) - Signed

## 2010-08-19 NOTE — Procedures (Signed)
Summary: Summary Report  Summary Report   Imported By: Erle Crocker 04/23/2010 14:30:01  _____________________________________________________________________  External Attachment:    Type:   Image     Comment:   External Document

## 2010-08-19 NOTE — Letter (Signed)
Summary: CMN/[facility]  CMN/[facility]   Imported By: Lester Greensburg 01/16/2010 10:56:27  _____________________________________________________________________  External Attachment:    Type:   Image     Comment:   External Document

## 2010-08-19 NOTE — Progress Notes (Signed)
Summary: letter request//lmtcb [name] .  spoke with [facility] who will call pt  Phone Note Call from Patient Call back at Home Phone [phone]   Caller: Patient Call For: clance Summary of Call: pt states that [facility] requests a letter from [facility] stating that pt is still using her O2 concentrator and bipap. (this is so pt can continue getting supplies/ mask, etc). pt states that she uses these q night. note: pt has a rov scheduled for 07/23/10.  Initial call taken by: Tivis Ringer, CNA,  June 05, 2010 4:08 PM  Follow-up for Phone Call        Pt states that [name] is waiting on [facility] to sign CMN for pt's bipap supplies.  Follow-up by: Vernie Murders,  June 05, 2010 4:31 PM  Additional Follow-up for Phone Call Additional follow up Details #1::        Alida can you find this cmn and f/u with [facility] Additional Follow-up by: Philipp Deputy CMA,  June 05, 2010 5:13 PM    Additional Follow-up for Phone Call Additional follow up Details #2::    Spoke with [facility] after looking for CMN, we do not have requested to have it refaxed. Kandice Hams CMA  June 06, 2010 11:16 AM  called and left msg with [place], we have not received fax yet.Kandice Hams CMA  June 10, 2010 11:53 AM Spoke with Lissa Hoard  they have no request for this.  Sharyn Creamer  @ [phone] where last DME was sent 01/01/10. s/w Darel Hong who has no outstanding request, she will call pt at home to get a clear understanding of her  request.  Last cmn was signed 01/01/10 by Dr Shelle Iron.     Follow-up by: Kandice Hams CMA,  June 11, 2010 2:09 PM

## 2010-08-19 NOTE — Letter (Signed)
Summary: CMN  CMN   Imported By: Lehman Prom 11/21/2009 09:48:48  _____________________________________________________________________  External Attachment:    Type:   Image     Comment:   External Document

## 2010-08-19 NOTE — Assessment & Plan Note (Signed)
Summary: rov   Visit Type:  Follow-up Primary Provider:  Aida Puffer  CC:  arrhythmia/hypotension.  History of Present Illness: The patient presents as an add on to my schedule for the above. She was seen by her primary provider yesterday and felt to be in atrial fibrillation. She has had hypotension and some dizziness. She says her blood pressure often runs in the 90s systolic and the diastolic is in the 30s. She actually has been breathing quite well. She's not had any PND or orthopnea. She's not had any syncope. She's had no chest pressure, neck or arm discomfort. She's had no lower extremity swelling. She actually has lost quite a bit of weight trying to diet. She is down 16 pounds over several months.  I reviewed the EKG from her primary provider and there is frequent atrial ectopy.  However, I did not see atrial fibrillation. She is not feeling any tachycardia palpitations.  Current Medications (verified): 1)  Januvia 100 Mg  Tabs (Sitagliptin Phosphate) .... Take One Tab By Mouth Once Daily 2)  Lorazepam 1 Mg  Tabs (Lorazepam) .... Take One Tab By Mouth Two Times A Day 3)  Coreg 3.125 Mg  Tabs (Carvedilol) .Marland Kitchen.. 1 By Mouth Qam Only 4)  Aceon 8 Mg  Tabs (Perindopril Erbumine) .... Take One Tab By Mouth Once Daily 5)  Zoloft 100 Mg  Tabs (Sertraline Hcl) .... Take 1/2 Tab By Mouth Once Daily 6)  Bayer Aspirin 325 Mg  Tabs (Aspirin) .... Take One Tab By Mouth Once Daily 7)  Lantus 100 Unit/ml  Soln (Insulin Glargine) .... Use As Directed 8)  Furosemide 40 Mg  Tabs (Furosemide) .... Take One Tab By Mouth Once Daily 9)  Apidra Opticlik 100 Unit/ml [place] Soln (Insulin Glulisine) .... Two Times A Day 10)  Hydrochlorothiazide 25 Mg Tabs (Hydrochlorothiazide) .... Take 1 Tablet By Mouth Once A Day 11)  Spironolactone 25 Mg Tabs (Spironolactone) .... Take 1 Tablet By Mouth Once A Day 12)  Simvastatin 40 Mg Tabs (Simvastatin) .... Take One Tablet By Mouth Daily At Bedtime 13)  Vitamin D  (Ergocalciferol) 50000 Unit Caps (Ergocalciferol) .... Take 2 Capsules Weekly 14)  Cyanocobalamin 1000 Mcg/ml Soln (Cyanocobalamin) .... Monthly 15)  Ferrous Sulfate 83 Mg Tabs (Ferrous Sulfate) .... Take 1 Tablet Two Times A Day By Mouth 16)  Victoza 18 Mg/74ml Soln (Liraglutide) .... As Directed  Allergies (verified): 1)  ! Glucophage 2)  Demerol 3)  Codeine  Past History:  Past Medical History: Reviewed history from 12/24/2009 and no changes required. 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 Carpal Tunnel surgery  1/09 Colon Cancer w/resection in 2000 Cholecystectomy 2002 Hysterectomy 1983   Review of Systems       As stated in the HPI and negative for all other systems.   Vital Signs:  Patient profile:   65 year old female Height:      63 inches Weight:      265 pounds BMI:     47.11 Pulse rate:   74 /  minute Pulse (ortho):   68 / minute Resp:     16 per minute BP sitting:   112 / 60  (right arm) BP standing:   108 / 50  Vitals Entered By: Marrion Coy, CNA (March 14, 2010 10:18 AM)  Serial Vital Signs/Assessments:  Time      Position  BP       Pulse  Resp  Temp     By 1120      Lying RA  112/50   60                    Sherri Rad, RN, BSN 1120      Sitting   106/50   64                    Sherri Rad, RN, BSN 1120      Standing  108/50   68                    Sherri Rad, Charity fundraiser, BSN  Comments: standing (2 min)- 108/50 HR- 68 standing (5 min)- 106/50 HR- 68 By: Sherri Rad, RN,  BSN    Physical Exam  General:  Well developed, well nourished, in no acute distress. Head:  normocephalic and atraumatic Mouth:  Teeth, gums and palate normal. Oral mucosa normal. Neck:  Neck supple, no JVD. No masses, thyromegaly or abnormal cervical nodes. Chest Wall:  no deformities or breast masses noted Lungs:  Clear bilaterally to auscultation and percussion. Abdomen:  Bowel sounds positive; abdomen soft and non-tender without masses, organomegaly, or hernias noted. No hepatosplenomegaly, obese Msk:  Back normal, normal gait. Muscle strength and tone normal. Extremities:  No clubbing or cyanosis. Neurologic:  Alert and oriented x 3. Skin:  Intact without lesions or rashes. Cervical Nodes:  no significant adenopathy Inguinal Nodes:  no significant adenopathy Psych:  Normal affect.   Detailed Cardiovascular Exam  Neck    Carotids: Carotids full and equal bilaterally without bruits.      Neck Veins: Normal, no JVD.    Heart    Inspection: no deformities or lifts noted.      Palpation: normal PMI with no thrills palpable.      Auscultation: regular rate and rhythm, S1, S2 without murmurs, rubs, gallops, or clicks.    Vascular    Abdominal Aorta: no palpable masses, pulsations, or audible bruits.      Pedal Pulses: diminished right dorsalis pedis pulse, diminished right posterior tibial pulse, diminished left dorsalis pedis pulse, and diminished left posterior tibial pulse.      Radial Pulses: normal radial pulses bilaterally.      Peripheral Circulation: no clubbing, cyanosis, or edema noted with normal capillary refill.     Impression & Recommendations:  Problem # 1:  ABNORMAL HEART RHYTHMS (ICD-427.9)  She is having sinus rhythm with frequent PACs. I will place a 24-hour Holter monitor to make sure there is no other dysrhythmia. Since she's not particularly symptomatic with this at this point I will not change therapies.  Orders: Holter (Holter)  Problem # 2:   CONGESTIVE HEART FAILURE (ICD-428.0) She seems to be euvolemic. However, she is lightheaded and hypotensive. I will gingerly back off on her medications by reducing her hydrochlorothiazide to 12.5 mg daily.  Problem # 3:  OBESITY HYPOVENTILATION SYNDROME (ICD-278.8) I greatly applied her weight loss and encouraged more of the same.  Patient Instructions: 1)  Your physician recommends that you schedule a  follow-up appointment in: 1 month with Dr. Gala Romney. 2)  Decrease HCTZ to 25mg  1/2 tab by mouth once daily. 3)  Your physician has recommended that you wear a  24 hour holter monitor.  Holter monitors are medical devices that record the heart's electrical activity. Doctors most often use these monitors to diagnose arrhythmias. Arrhythmias are problems with the speed or rhythm of the heartbeat. The monitor is a small, portable device. You can wear one while you do your normal daily activities. This is usually used to diagnose what is causing palpitations/syncope (passing out).

## 2010-08-19 NOTE — Progress Notes (Signed)
Summary: nos appt  Phone Note Call from Patient   Caller: [email or web address]  Call For: [name] Summary of Call: LMTCB x2 to rsc nos from 5/11. Initial call taken by: Darletta Moll,  Nov 28, 2009 4:19 PM     Appended Document: nos appt this is a Clance pt...  SN

## 2010-08-19 NOTE — Progress Notes (Signed)
Summary: Fax CMN  Phone Note Other Incoming Call back at [phone]   Caller: [facility] Summary of Call: Please fax CMN to [facility] Fax # [phone]  Attn: Darel Hong Initial call taken by: Eugene Gavia,  Nov 28, 2009 3:37 PM  Follow-up for Phone Call        Faxed CMN.//Juanita Follow-up by: Darletta Moll,  Nov 28, 2009 3:47 PM

## 2010-08-19 NOTE — Letter (Signed)
Summary: [facility]  [facility]   Imported By: Marylou Mccoy 03/31/2010 14:36:30  _____________________________________________________________________  External Attachment:    Type:   Image     Comment:   External Document

## 2010-08-19 NOTE — Assessment & Plan Note (Signed)
Summary: 1 month @ 9:15 rov from check out 03-14-10/sl   Visit Type:  Follow-up Primary Provider:  Aida Puffer  CC:  no complaints.  History of Present Illness: [name] is a delightful 65 y/o with history of morbid obesity complicated by obesity hypoventilation syndrome, hypertension, sleep apnea, diabetes, and mild secondary pulmonary hypertension as well as hypokalemia.   Initially, she had an echocardiogram read as severe LV dysfunction with an EF of 15-20%.  She underwent catheterization in 2008 that showed minimal nonobstructive coronary artery disease with EF 55%.  She had mild pulmonary hypertension with both pulmonary venous and pulmonary arterial components.  Her PVR was 5.1 Wood units. Echo in 3/09 EF 60%.  Recently saw Dr. Antoine Poche over question of possible AF. ECG with PACs. 24 monitor NSR with occ PACs and PVCS. No AF. BP low and HCTZ and Coreg decreased. No feels better. No palpitations or syncope. No CP. Simva recently increased due to carotid disease which progressed mildly. now 40-59% bilaterally.  Current Medications (verified): 1)  Januvia 100 Mg  Tabs (Sitagliptin Phosphate) .... Take One Tab By Mouth Once Daily 2)  Lorazepam 1 Mg  Tabs (Lorazepam) .... Take One Tab By Mouth Two Times A Day 3)  Coreg 3.125 Mg  Tabs (Carvedilol) .Marland Kitchen.. 1 By Mouth Qam Only 4)  Aceon 8 Mg  Tabs (Perindopril Erbumine) .... Take One Tab By Mouth Once Daily 5)  Zoloft 100 Mg  Tabs (Sertraline Hcl) .... Take 1/2 Tab By Mouth Once Daily 6)  Bayer Aspirin 325 Mg  Tabs (Aspirin) .... Take One Tab By Mouth Once Daily 7)  Lantus 100 Unit/ml  Soln (Insulin Glargine) .... Use As Directed 8)  Furosemide 40 Mg  Tabs (Furosemide) .... Take One Tab By Mouth Once Daily 9)  Apidra Opticlik 100 Unit/ml [place] Soln (Insulin Glulisine) .... Two Times A Day 10)  Hydrochlorothiazide 25 Mg Tabs (Hydrochlorothiazide) .... Take 1/2  Tablet By Mouth Once A Day 11)  Spironolactone 25 Mg Tabs (Spironolactone) .... Take 1  Tablet By Mouth Once A Day 12)  Simvastatin 40 Mg Tabs (Simvastatin) .... Take One Tablet By Mouth Daily At Bedtime 13)  Vitamin D (Ergocalciferol) 50000 Unit Caps (Ergocalciferol) .... Take 2 Capsules Weekly 14)  Cyanocobalamin 1000 Mcg/ml Soln (Cyanocobalamin) .... Monthly 15)  Ferrous Sulfate 83 Mg Tabs (Ferrous Sulfate) .... Take 1 Tablet Two Times A Day By Mouth 16)  Victoza 18 Mg/9ml Soln (Liraglutide) .... As Directed  Allergies (verified): 1)  ! Glucophage 2)  Demerol 3)  Codeine  Review of Systems       As per HPI and past medical history; otherwise all systems negative.   Vital Signs:  Patient profile:   65 year old female Height:      63 inches Weight:      272 pounds BMI:     48.36 Pulse rate:   85 / minute BP sitting:   118 / 66  (left arm) Cuff size:   regular  Vitals Entered By: Hardin Negus, RMA (April 22, 2010 9:45 AM)  Physical Exam  General:  Gen: well appearing. no resp difficulty HEENT: normal Neck: supple. no JVD. Carotids 2+ bilat; no bruits. No lymphadenopathy or thryomegaly appreciated. Cor: PMI nondisplaced. Regular rate & rhythm. No rubs, gallops, murmur. Lungs: clear Abdomen: soft, nontender, nondistended. No hepatosplenomegaly. No bruits or masses. Good bowel sounds. Extremities: no cyanosis, clubbing, rash, edema Neuro: alert & orientedx3, cranial nerves grossly intact. moves all 4 extremities w/o difficulty. affect  pleasant    Impression & Recommendations:  Problem # 1:  PALPITATIONS (ICD-785.1) No evidence of atrial fib on monitor. Reassured her. Will continue low dose b-blocker for PACs and symptomatic relief.   Problem # 2:  CAROTID ARTERY STENOSIS (ICD-433.10) Mildly increased. Asymptomatic. Will repeat u/s in 6  months. Continue ASA and statin. Goal LDL < 70.

## 2010-08-19 NOTE — Progress Notes (Signed)
Summary: cmn  Phone Note Call from Patient Call back at [phone] ex [phone]   Caller: [facility]  judy Call For: clance Summary of Call: calling about cmn that was sent on april 7th for oxygen Initial call taken by: Rickard Patience,  November 14, 2009 9:37 AM  Follow-up for Phone Call        [place], can you tell me if [facility] has received this one? Thanks! Vernie Murders  November 14, 2009 9:42 AM   Additional Follow-up for Phone Call Additional follow up Details #1::        CMN is attached to pt's chart and is on [facility] dictation desk.  Arman Filter LPN  November 14, 2009 2:58 PM  Additional Follow-up by: Barbaraann Share MD,  November 14, 2009 4:45 PM

## 2010-08-19 NOTE — Progress Notes (Signed)
Summary: c/o lightheadness, b/p 98/47  Phone Note Call from Patient Call back at [facility] Phone [phone]   Caller: Other Relative- EMMA Andrey Campanile  [phone] Reason for Call: Talk to Nurse Details for Reason: c/o lightheadness, cloudy eyes, b/p 98/47. no chestpain, no sob.  Summary of Call: would have her stop lasix and only take as needed. would have her see PCP for BMET, CBC Initial call taken by: Lorne Skeens,  February 21, 2010 3:40 PM  Follow-up for Phone Call        Spoke with pt. Patient c/o of being ligntheaded and having blurred vision when standing, for the last 2 to 3 days. Pt's  blood pressure today is 98/47. On the last office visit pt's B/P was 122/64. Pt.  Patient states she has been on a High protein low carbs diet. She has lost  close to 20 lbs in 2 and a 1/2 weeks, and her blood sugar is doing well. Pt. would like to know if she can hold her Coreg 3.125 mg when her b/p IS LOW.  Follow-up by: Ollen Gross, RN, BSN,  February 21, 2010 4:54 PM  Additional Follow-up for Phone Call Additional follow up Details #1::        Dr. Excell Seltzer DOD okay for pt. to hold the coreg  3.125 mg when her B/P is low. RN  Advice pt. to take her B/P prior taken medication tonight. Pt. verbalized understanding. Ollen Gross, RN, BSN  February 21, 2010 5:13 PM

## 2010-08-19 NOTE — Progress Notes (Signed)
Summary: Cardiology Phone Note - n,v,fever,rigors  Phone Note Call from Patient   Caller: sister Summary of Call: received call from ms. Severt's sister.  ms. Harps has been febrile w/ temps to 103F assoc. with rigors, n, v, malaise.  i recommended they present to [facility] for eval and prob. IM admission. Initial call taken by: Creig Hines, ANP-BC,  December 29, 2009 10:43 AM

## 2010-08-21 NOTE — Assessment & Plan Note (Signed)
Summary: rov for OHS   Visit Type:  Follow-up Primary Provider/Referring Provider:  Aida Puffer ([place])  CC:  follow up. pt states she uses her bipap machine everynight x 7-8 hrs a night. pt states she is having no problems out her machine. pt c/o cough w/ yellow to green phlem, pt feels head is stopped up, feet swelling, and a little sore throat x1 week. Marland Kitchen  History of Present Illness: the pt comes in today for f/u of her known OHS.  She is wearing bipap compliantly, and having no issues with her machine or pressure.  She is having mask issues, and is due for a replacement.  She feels she is sleeping well with the device, and that her daytime alertness is satisfactory.  She is c/o head and ear congestion, but no purulence from nares.  She is using her humidifier on the cpap.  Current Medications (verified): 1)  Januvia 100 Mg  Tabs (Sitagliptin Phosphate) .... Take One Tab By Mouth Once Daily 2)  Lorazepam 1 Mg  Tabs (Lorazepam) .... Take One Tab By Mouth Two Times A Day 3)  Coreg 3.125 Mg  Tabs (Carvedilol) .Marland Kitchen.. 1 By Mouth Qam Only 4)  Aceon 8 Mg  Tabs (Perindopril Erbumine) .... Take One Tab By Mouth Once Daily 5)  Zoloft 100 Mg  Tabs (Sertraline Hcl) .... Take 1/2 Tab By Mouth Once Daily 6)  Bayer Aspirin 325 Mg  Tabs (Aspirin) .... Take One Tab By Mouth Once Daily 7)  Lantus 100 Unit/ml  Soln (Insulin Glargine) .... Use As Directed 8)  Furosemide 40 Mg  Tabs (Furosemide) .... Take One Tab By Mouth Once Daily 9)  Apidra Opticlik 100 Unit/ml [place] Soln (Insulin Glulisine) .Marland Kitchen.. 10 Units Three Times A Day 10)  Hydrochlorothiazide 25 Mg Tabs (Hydrochlorothiazide) .... Take 1/2  Tablet By Mouth Once A Day 11)  Spironolactone 25 Mg Tabs (Spironolactone) .... Take 1 Tablet By Mouth Once A Day 12)  Simvastatin 40 Mg Tabs (Simvastatin) .... Take One Tablet By Mouth Daily At Bedtime 13)  Vitamin D (Ergocalciferol) 50000 Unit Caps (Ergocalciferol) .... Take 2 Capsules Weekly 14)  Cyanocobalamin 1000  Mcg/ml Soln (Cyanocobalamin) .... Monthly 15)  Ferrous Sulfate 83 Mg Tabs (Ferrous Sulfate) .... Take 1 Tablet Two Times A Day By Mouth 16)  Victoza 18 Mg/39ml Soln (Liraglutide) .... As Directed  Allergies (verified): 1)  ! Glucophage 2)  Demerol 3)  Codeine  Social History: Disabled  Married, has 3 children Tobacco Use - No.  never smoker  Review of Systems       The patient complains of productive cough, weight change, sore throat, headaches, nasal congestion/difficulty breathing through nose, hand/feet swelling, and joint stiffness or pain.  The patient denies shortness of breath with activity, shortness of breath at rest, non-productive cough, coughing up blood, chest pain, irregular heartbeats, acid heartburn, indigestion, loss of appetite, abdominal pain, difficulty swallowing, tooth/dental problems, sneezing, itching, ear ache, anxiety, depression, rash, change in color of mucus, and fever.    Vital Signs:  Patient profile:   65 year old female Height:      63 inches Weight:      279.13 pounds BMI:     49.62 O2 Sat:      97 % on Room air Temp:     97.5 degrees F oral Pulse rate:   91 / minute BP sitting:   136 / 68  (left arm) Cuff size:   regular  Vitals Entered By: Carver Fila (July 23, 2010 10:30 AM)  O2 Flow:  Room air  CC: follow up. pt states she uses her bipap machine everynight x 7-8 hrs a night. pt states she is having no problems out her machine. pt c/o cough w/ yellow to green phlem, pt feels head is stopped up, feet swelling, a little sore throat x1 week.  Comments meds and allergies updated Phone number updated Mindy Edward Jolly  July 23, 2010 10:31 AM    Physical Exam  General:  obese female in nad Nose:  no skin breakdown or pressure necrosis from cpap mask Lungs:  clear to auscultation  Heart:  sounds regular rate and rhythm Extremities:  2+ edema bilat, no cyanosis  Neurologic:  alert and oriented, does not appear sleepy, moves all  4.   Impression & Recommendations:  Problem # 1:  OBESITY HYPOVENTILATION SYNDROME (ICD-278.8) the pt is doing well with cpap, but will need a new mask.  I have stressed to her again the need to work aggressively on weight loss.  It sounds like she may be having eustachian tube dysfunction, although I can't exclude an early sinus infection.  I have asked her to take a mucolytic, and to try neilmed sinus rinses.    Medications Added to Medication List This Visit: 1)  Apidra Opticlik 100 Unit/ml [place] Soln (Insulin glulisine) .Marland Kitchen.. 10 units three times a day  Other Orders: Est. Patient Level III (04540) DME Referral (DME)  Patient Instructions: 1)  use neilmed sinus rinses am and pm to see if will unplug your ears. 2)  mucinex dm extra strength  one in am and pm until better. 3)  please call if your cough and mucus perists. 4)  will send an order to [facility] to get you a new mask 5)  work on weight reduction. 6)  followup with me in 6mos.   Immunization History:  Influenza Immunization History:    Influenza:  historical (04/19/2010)

## 2010-09-03 ENCOUNTER — Encounter: Payer: Self-pay | Admitting: Pulmonary Disease

## 2010-09-12 ENCOUNTER — Encounter: Payer: Self-pay | Admitting: Pulmonary Disease

## 2010-09-16 ENCOUNTER — Ambulatory Visit: Payer: Self-pay

## 2010-09-16 NOTE — Letter (Signed)
Summary: CMN For Oxygen / [facility]  CMN For Oxygen / [facility]   Imported By: Lennie Odor 09/12/2010 12:08:46  _____________________________________________________________________  External Attachment:    Type:   Image     Comment:   External Document

## 2010-09-16 NOTE — Letter (Signed)
Summary: CMN For Supplies / [employer]  CMN For Supplies / [facility]   Imported By: Lennie Odor 09/12/2010 12:06:40  _____________________________________________________________________  External Attachment:    Type:   Image     Comment:   External Document

## 2010-09-16 NOTE — Letter (Signed)
Summary: CMN For Supplies / [employer]  CMN For Supplies / [facility]   Imported By: Lennie Odor 09/12/2010 12:11:04  _____________________________________________________________________  External Attachment:    Type:   Image     Comment:   External Document

## 2010-09-29 ENCOUNTER — Telehealth: Payer: Self-pay | Admitting: Pulmonary Disease

## 2010-10-02 ENCOUNTER — Ambulatory Visit
Admission: RE | Admit: 2010-10-02 | Discharge: 2010-10-02 | Disposition: A | Discharge status: Home / Self Care | Payer: PRIVATE HEALTH INSURANCE | Source: Ambulatory Visit | Attending: Family Medicine | Admitting: Family Medicine

## 2010-10-02 DIAGNOSIS — Z1239 Encounter for other screening for malignant neoplasm of breast: Secondary | ICD-10-CM

## 2010-10-06 LAB — CULTURE, BLOOD (ROUTINE X 2): Culture: NO GROWTH

## 2010-10-06 LAB — CBC
Hemoglobin: 10.6 g/dL — ABNORMAL LOW (ref 12.0–15.0)
MCHC: 33.7 g/dL (ref 30.0–36.0)
RBC: 3.46 MIL/uL — ABNORMAL LOW (ref 3.87–5.11)
WBC: 10.5 10*3/uL (ref 4.0–10.5)

## 2010-10-06 LAB — COMPREHENSIVE METABOLIC PANEL
ALT: 25 U/L (ref 0–35)
AST: 22 U/L (ref 0–37)
Alkaline Phosphatase: 51 U/L (ref 39–117)
CO2: 24 mEq/L (ref 19–32)
Chloride: 105 mEq/L (ref 96–112)
GFR calc Af Amer: 39 mL/min — ABNORMAL LOW (ref >60)
GFR calc non Af Amer: 32 mL/min — ABNORMAL LOW (ref >60)
Glucose, Bld: 101 mg/dL — ABNORMAL HIGH (ref 70–99)
Potassium: 4.2 mEq/L (ref 3.5–5.1)
Sodium: 138 mEq/L (ref 135–145)
Total Bilirubin: 0.5 mg/dL (ref 0.3–1.2)

## 2010-10-06 LAB — DIFFERENTIAL
Basophils Relative: 0 % (ref 0–1)
Eosinophils Absolute: 0 10*3/uL (ref 0.0–0.7)
Eosinophils Relative: 0 % (ref 0–5)
Lymphs Abs: 1 10*3/uL (ref 0.7–4.0)
Neutrophils Relative %: 81 % — ABNORMAL HIGH (ref 43–77)

## 2010-10-06 LAB — URINALYSIS, ROUTINE W REFLEX MICROSCOPIC
Glucose, UA: NEGATIVE mg/dL
Ketones, ur: NEGATIVE mg/dL
Protein, ur: 30 mg/dL — AB
pH: 5 (ref 5.0–8.0)

## 2010-10-06 LAB — RETICULOCYTES
RBC.: 3.48 MIL/uL — ABNORMAL LOW (ref 3.87–5.11)
Retic Ct Pct: 2.5 % (ref 0.4–3.1)

## 2010-10-06 LAB — URINE CULTURE

## 2010-10-06 LAB — URINE MICROSCOPIC-ADD ON

## 2010-10-07 NOTE — Progress Notes (Signed)
Summary: 02 pt  Phone Note Call from Patient   Caller: [email or web address]  Call For: Kym Fenter Summary of Call: dr Juanell Saffo we checked your dic from the last ov in 1/12 and there was no mention of her on supplemental 02 where as before you mentioned bipap with 02 hs ,pt is now medicare and this is a requirement of medicare for the 02 use to be included in ov note well pt is not due back until 7/12 and her 02 will not be covered by medicare until you doc 02 use do you want to do an append to the 1/12 note or do you want pt to come back in earlier?  we are just acting on the decision YOU  made to let YOU know when these things need to be addressed about your 02 pts thanks for your promptness in this matter Initial call taken by: Oneita Jolly,  September 29, 2010 12:07 PM  Follow-up for Phone Call        I do not need to do either.  It is none of the dme's business what we put in our charts.DME's should have no access to the chart.  We just need to send one of those forms we have documenting to DME that she is on oxygen.  the dme has no input of what we put in our charts...medicare can audit our charts, the dme cannot.  So we will just document to them (dme) in a note the pt wears oxygen at hs. Follow-up by: Barbaraann Share MD,  September 29, 2010 2:45 PM

## 2010-10-27 ENCOUNTER — Other Ambulatory Visit: Payer: Self-pay | Admitting: Internal Medicine

## 2010-10-27 NOTE — Telephone Encounter (Signed)
Pt states she has been taking 1 tab po qd will  Refill this medication until next visit

## 2010-11-15 ENCOUNTER — Encounter: Payer: Self-pay | Admitting: Internal Medicine

## 2010-11-21 ENCOUNTER — Ambulatory Visit: Payer: Self-pay | Admitting: Internal Medicine

## 2010-12-02 NOTE — Assessment & Plan Note (Signed)
[facility]                            CARDIOLOGY OFFICE NOTE   [name], [name]                      MRN:          [id number]  DATE:03/29/2008                            DOB:          09/25/1945    PRIMARY CARE PHYSICIAN:  Dr. Aida [name].   PULMONOLOGIST:  Dana Share, MD, FCCP   INTERVAL HISTORY:  [name] is a delightful 65 year old with history of  morbid obesity complicated by obesity hypoventilation syndrome,  hypertension, sleep apnea, diabetes, and mild secondary pulmonary  hypertension as well as hypokalemia.   Initially, she had an echocardiogram read as severe LV dysfunction with  an EF of 15-20%.  She underwent catheterization last year that showed  minimal nonobstructive coronary artery disease.  She had mild pulmonary  hypertension with both pulmonary venous and pulmonary arterial  components.  Her PVR was 5.1 Wood units.   She returns today for followup.  She states she is doing great.  Denies  any chest pain or significant dyspnea.  She is no longer wearing oxygen  during the day, just BiPAP at night.  She has occasional lower extremity  edema, but no orthopnea or PND.  We have been struggling with her  hypokalemia, and she is currently on high-dose potassium replacement.   Review of systems is negative except for as above.   CURRENT MEDICATIONS:  1. Januvia.  2. Hydrochlorothiazide 50 mg a day.  3. Lorazepam 1 mg b.i.d.  4. Coreg 3.125 b.i.d.  5. Aceon 8 mg a day.  6. Zoloft 50 mg a day.  7. Aspirin 325 a day.  8. Lantus insulin.  9. Lasix 40 a day.  10.BiPAP at night.  11.Potassium 60 mEq b.i.d.   PHYSICAL EXAMINATION:  GENERAL:  She is an obese woman in no acute  distress, ambulatory around the clinic, without any respiratory  difficulty.  VITAL SIGNS:  Blood pressure is 130/86, heart rate 73, weight is 268.  HEENT:  Normal.  NECK:  Supple and thick, hard to assess JVD, does not appear markedly  elevated.   Carotids are 2+ bilaterally without bruits.  There is no  lymphadenopathy or thyromegaly.  CARDIAC:  PMI is nonpalpable.  She has distant heart sounds.  She is  regular with S4, no murmur.  LUNGS:  Clear.  ABDOMEN:  Obese, nontender, nondistended, unable to evaluate for  hepatosplenomegaly, no bruits, no masses.  EXTREMITIES:  Warm with marked calf hypertrophy.  There is no cyanosis  or clubbing.  She has trace edema.  No rash.  NEURO:  Alert and oriented x3.  Cranial nerves II through XII intact.  Moves all 4 extremities without difficulty.  Affect is pleasant.   EKG shows sinus rhythm with PACs.  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  Her T-wave voltage is quite low.   ASSESSMENT AND PLAN:  1. Dyspnea.  This is much improved.  She does have mild-to-moderate      pulmonary hypertension, which is secondary to her obesity and      hypoventilation syndrome.  She is followed by Dr. Shelle Iron.  2. Hyperkalemia.  We will decrease her hydrochlorothiazide to 25 mg a      day, start her on spironolactone 25 a day, and decrease her      potassium to 40 b.i.d.  We will follow up with BMET in 1 week and 3      weeks.  She will follow up with her primary care physician.     Dana Buckles. Bensimhon, MD  Electronically Signed    DRB/MedQ  DD: 03/29/2008  DT: 03/30/2008  Job #: [id number]   cc:   [name]  Dana Share, MD,FCCP

## 2010-12-02 NOTE — Op Note (Signed)
NAME:  [name], [name]               ACCOUNT NO.:  [id number]   MEDICAL RECORD NO.:  [id number]          PATIENT TYPE:  AMB   LOCATION:  ENDO                         FACILITY:  [facility]   PHYSICIAN:  John C. Madilyn Fireman, M.D.    DATE OF BIRTH:  Feb 10, 1946   DATE OF PROCEDURE:  05/01/2008  DATE OF DISCHARGE:                               OPERATIVE REPORT   INDICATIONS FOR PROCEDURE:  History of colon cancer in 2000, last  colonoscopy 2004.   PROCEDURE:  The patient was placed in the left lateral decubitus  position and placed on the pulse monitor with continuous low-flow oxygen  delivered by nasal cannula.  She was sedated with 50 mcg IV fentanyl and  6 mg IV Versed.  The Olympus video colonoscope was inserted into the  rectum and advanced to the ileocolonic anastomosis in the right lower  quadrant and the terminal ileum explored for several centimeters.  The  prep was good.  The terminal ileum as well as the anastomosis appeared  normal with no suggestion of a recurrent neoplasm.  The remaining  proximal colon as well as the descending, sigmoid and rectum appeared  normal with exception of some small sigmoid diverticuli.  Scope was then  withdrawn and the patient returned to the recovery room in stable  condition.  She tolerated the procedure well.  There were no immediate  complications.   IMPRESSION:  Few diverticuli, otherwise normal study, status post right  hemicolectomy.   PLAN:  Repeat colonoscopy in 5 years.           ______________________________  Everardo All Madilyn Fireman, M.D.     JCH/MEDQ  D:  05/01/2008  T:  05/01/2008  Job:  [id number]   cc:   Aida Puffer  Fax: [phone]   Barbaraann Share, MD,FCCP  [address]  [place]  [place] [address]   [name], MD  [address], [place] [address]

## 2010-12-02 NOTE — Assessment & Plan Note (Signed)
[facility]                            CARDIOLOGY OFFICE NOTE   [name], [name]                      MRN:          [id number]  DATE:01/04/2007                            DOB:          1945-11-14    PRIMARY CARE PHYSICIAN:  Aida Puffer, M.D.   PULMONOLOGIST:  Barbaraann Share, M.D.   PRIMARY CARDIOLOGIST:  [name], M.D.   PATIENT PROFILE:  This 65 year old Caucasian female who recently  underwent a right and left cardiac catheterization presents for  followup.   PROBLEM:  1. Pulmonary hypertension      a.     Status post right and left cardiac catheterization on December 20, 2006, revealing an RA of 9, RV of 74/5, PA 70/23 (45), PCWP 21       with a V-wave of 27.  LVEDP was 18.  Cardiac output was 4.7 liters       per minute with a cardiac index of 2.2 liters per minute per sq m.       PVR was 5.1 Wood units.  Coronary angiography revealed non-       obstructive coronary artery disease with an ejection fraction of       55%, with question of mild distal anterior wall hypokinesis.  2. Hypoxemia.  3. Hypertension.  4. History of congestive heart failure.  5. History of superficial lower extremity deep venous thrombosis.  6. Severe sleep apnea, on CPAP.  7. History of colon carcinoma, status post resection.  8. Type 2 diabetes mellitus, diagnosed 10 years ago.   HISTORY OF PRESENT ILLNESS:  This 65 year old Caucasian female recently  seen in the clinic by Dr. Gala Romney on Dec 16, 2006, and subsequently  set up for a right and left cardiac catheterization, which took place on  December 20, 2006.  This revealed elevated right heart pressures with only  minimally elevated left-sided filling pressures and normal coronary  arteries.  She presents for followup and a groin check.  She has not had  any chest pain or significant dyspnea since her catheterization and  reports that her groin has been doing well.  She seems fairly dedicated  to wanting to lose weight and plans on joining [employer].   HOME MEDICATIONS:  1. Januvia 100 mg daily.  2. Hydrochlorothiazide 50 mg daily.  3. K-Dur 10 mEq 2 tabs daily.  4. Lorazepam 1 mg b.i.d.  5. Coreg 3.125 mg b.i.d.  6. Aceon 4 mg daily.  7. Zoloft 100 mg 1/2 tab daily.  8. Aspirin 325 mg daily.  9. CPAP nightly.  10.Lantus as directed.  11.Apringia insulin as directed.  12.NovoLog FlexPen as directed.  13.Lasix 40 mg daily.   PHYSICAL EXAMINATION:  VITAL SIGNS:  Blood pressure 154/70, heart rate  65, respirations 16, weight 261 pounds which is up 2 pounds since her  last visit.  GENERAL:  A pleasant white female, in no acute distress, alert and  oriented x3.  NECK:  No bruits or jugular venous distention.  LUNGS:  Respirations regular and unlabored.  Clear  to auscultation.  HEART:  Regular S1 and S2.  No S3 or S4, no murmurs.  ABDOMEN:  Obese, soft, nontender, non-distended.  Bowel sounds present  x4.  EXTREMITIES:  Warm and dry and pink with 1+ bilateral lower extremity  edema.  She has large calf muscles bilaterally.  The right groin which  was used for the catheterization is clear of bleeding, bruising or  hematoma.  Dorsalis pedis and posterior tibial pulses 2+ and equal  bilaterally.   ACCESSORY CLINICAL FINDINGS:  None.   ASSESSMENT/PLAN:  1. Coronary artery disease, non-obstructive by cardiac      catheterization:  She remains on medical therapy.  2. Pulmonary hypertension:  Followed by Dr. Shelle Iron.  She remains on      treatment for sleep apnea and is potentially a candidate for      Tracleer.  3. Hypertension:  The blood pressure remains elevated today.  We have      increased her Aceon to 8 mg daily.  4. Lower extremity edema, somewhat chronic and likely related to her      elevated right heart pressures.  Her Lasix has been changed from 40      mg q.o.d. to daily by Dr. Shelle Iron.  She has minimal edema today.   DISPOSITION:  The patient will  follow up with Dr. Gala Romney in  approximately three months.      [name], ANP  Electronically Signed      Madolyn Frieze. Jens Som, MD, [facility]  Electronically Signed   CB/MedQ  DD: 01/04/2007  DT: 01/05/2007  Job #: [id number]

## 2010-12-02 NOTE — Assessment & Plan Note (Signed)
[facility]                             PULMONARY OFFICE NOTE   HAANI, BAKULA                      MRN:          [id number]  DATE:12/14/2006                            DOB:          01/11/1946    SUBJECTIVE:  [name] comes in today for followup of her chronic  respiratory failure secondary to sleep apnea, obesity hypoventilation as  well as cor pulmonale.  She has had a recent VQ scan to rule out chronic  thromboembolic disease with totally normal perfusion being noted.  The  patient also underwent an echocardiogram that showed a left ventricular  ejection fraction estimated at 15-25% and diffuse left ventricular  hypokinesis.  There was also moderate dilatation of the right ventricle  and decreased right ventricular systolic function.  The patient overall  has been compliant with her BiPAP ST and has done very, very well with  this.  She has also been compliant on her oxygen.  She has had overnight  oximetry on the BiPAP ST as well as 2 L and still had O2 desaturation as  low as 77%.  She is now on 3 L at bedtime with her BiPAP machine.  Her  fluid status has responded fairly well to the diuretic and she is down 8  pounds from my initial visit with her the end of February.   OBJECTIVE:  GENERAL:  She is an obese, white female in no acute  distress.  VITAL SIGNS:  Blood pressure 140/72, pulse 72, temperature 98, weight  261 pounds, O2 saturations on 2 L 94%.  CHEST:  Fairly clear except for bibasilar crackles.  CARDIAC:  Regular rate and rhythm.  EXTREMITIES:  Lower extremities show no significant edema, but severe  varicosities.   IMPRESSION:  1. Chronic respiratory failure secondary to obesity hypoventilation      syndrome as well as obstructive sleep apnea and cor pulmonale.  She      is clearly better with diuresis.  At this point in time, she needs      to stay on her BiPAP ST as well as her oxygen compliantly.  2. Newly diagnosed  cardiomyopathy.  It is unclear whether this is an      idiopathic cardiomyopathy or whether she could have coronary      disease explaining this.  I really think she needs to have a      cardiology consult at this time to consider coronary disease as      well as medication adjustments for her heart failure.  I have asked      her if she has ever seen a cardiologist before and she has told me      no.  She also states this was the first echocardiogram that she has      had.   PLAN:  1. Will refer to cardiology for evaluation of cardiomyopathy.  2. No change in her current medications.  3. Stay on BiPAP as well as the O2.  4. The patient will follow up in 3-4 months or sooner if there are  problems.     Barbaraann Share, MD,FCCP  Electronically Signed    [facility]/MedQ  DD: 12/14/2006  DT: 12/14/2006  Job #: [id number]   cc:   Aida Puffer

## 2010-12-02 NOTE — Op Note (Signed)
NAME:  [name], [name]               ACCOUNT NO.:  [id number]   MEDICAL RECORD NO.:  [id number]          PATIENT TYPE:  AMB   LOCATION:  DSC                          FACILITY:  [facility]   PHYSICIAN:  Lowell Bouton, M.D.DATE OF BIRTH:  August 31, 1945   DATE OF PROCEDURE:  08/10/2007  DATE OF DISCHARGE:                               OPERATIVE REPORT   PREOPERATIVE DIAGNOSIS:  Right carpal tunnel syndrome.   POSTOPERATIVE DIAGNOSIS:  Right carpal tunnel syndrome.   PROCEDURE:  Decompression, median nerve, right carpal tunnel.   SURGEON:  Lowell Bouton, M.D.   ANESTHESIA:  0.5% Marcaine local with sedation.   OPERATIVE FINDINGS:  The patient had no masses in the carpal canal.  The  motor branch of the nerve was intact.  The nerve did appear to be  significantly compressed.   PROCEDURE:  Under 0.5% Marcaine local anesthesia with a tourniquet on  the right arm, the right hand was prepped and draped in the usual  fashion.  After exsanguinating the limb, the tourniquet was inflated to  250 mmHg.  A 3-cm longitudinal incision was made in the palm just ulnar  to the thenar crease and carried through the subcutaneous tissues.  Bleeding points were coagulated.  Blunt dissection was carried down  through the superficial palmar fascia distal to the transverse carpal  ligament.  A hemostat was then placed in the carpal canal up against the  hook of the hamate.  The transverse carpal ligament was divided on the  ulnar border of the median nerve sharply.  The proximal end of the  ligament was divided with a scissors after dissecting the nerve away  from the undersurface of the ligament.  The carpal canal was then  palpated and was found to be adequately decompressed.  The nerve was  examined and the motor branch was identified.  The wound was then  irrigated with saline.  The skin was closed with 4-0 nylon sutures.  Sterile dressings were applied, followed by a volar wrist splint.   The  patient tolerated the procedure well and went to the recovery room awake  and stable, in good condition.      Lowell Bouton, M.D.  Electronically Signed     EMM/MEDQ  D:  08/10/2007  T:  08/10/2007  Job:  [id number]

## 2010-12-02 NOTE — Assessment & Plan Note (Signed)
[facility]                            CARDIOLOGY OFFICE NOTE   [name], [name]                      MRN:          [id number]  DATE:04/05/2007                            DOB:          1959-10-10    PRIMARY CARE PHYSICIAN:  Aida Puffer, M.D.   PULMONOLOGIST:  Barbaraann Share, M.D., [facility].   INTERVAL HISTORY:  [name] is a very pleasant 65 year old woman with a  history of morbid obesity as well as hypertension, sleep apnea, and  diabetes.  We saw her back in May 2008 for evaluation of an abnormal  echocardiogram which showed an ejection fraction of 15%-20%, and with  evidence of pulmonary hypertension.  She underwent right and left heart  catheterization which showed minimal nonobstructive coronary artery  disease.  PA pressure was 70/23, with a mean of 45.  Her mean wedge  pressure was 21.  LVEDP was 18.  Cardiac output was 4.7 liters/minute,  to give her a PVR, pulmonary vascular resistance make that of 5.1 Woods  Units.  Ejection fraction was 55% by left ventriculogram.  Subsequently,  she was started on increased diuretics, and also she has been focusing  on being compliant with her BiPAP.  She says she feels much better.  She  does have home oxygen.  She says she is able to ambulate without much  difficulty and is really limited more by leg pain.  She denies orthopnea  or PND.  Does have some mild lower extremity edema at times.  No chest  pain.   CURRENT MEDICATIONS:  1. Januvia 100 mg a day.  2. Hydrochlorothiazide 50 a day.  3. Lorazepam 1 mg b.i.d.  4. Coreg 3.125 b.i.d.  5. Aceon 8 mg a day.  6. Zoloft 50 mg a day.  7. Aspirin 325 a day.  8. BiPAP.  9. Lantus insulin.  10.Lasix 40 a day.  11.Potassium 30 b.i.d.   PHYSICAL EXAMINATION:  GENERAL:  She is able to ambulate around the  clinic without any significant respiratory difficulty.  I actually  walked her around one big loop of the clinic.  She is able to walk  fairly  quickly, without any difficulty.  O2 saturations start out at 94%  and are 91% at the end of our walk.  At one point, we did get a reading  of 89%, but I am not sure if this is correct.  She was not on her  oxygen.  VITAL SIGNS:  Blood pressure is 128/60, heart rate is 89, weight is 266.  HEENT:  Normal.  NECK:  Supple.  No obvious JVD.  Carotids are 2+ bilaterally, without  bruits.  There is no lymphadenopathy or thyromegaly.  CARDIAC:  PMI is nonpalpable.  She has a regular rate and rhythm.  I do  not feel an RV lift.  She has a 2/6 systolic ejection murmur at the left  sternal border.  LUNGS:  Clear.  ABDOMEN:  Obese, nontender, nondistended.  No hepatosplenomegaly.  No  bruits.  No masses.  Good bowel sounds.  EXTREMITIES:  Warm, with no cyanosis,  clubbing, or edema.  She has very  prominent calf muscles.  No rash.  NEUROLOGIC:  Alert and oriented x3.  Cranial nerves II-XII are intact.  Moves all four extremities without difficulty.  Affect is very pleasant.   EKG:  Shows normal sinus rhythm at a rage of 89, with a left anterior  fascicular block, and a borderline first-degree AV block, with a PR  interval of 200 msec.   ASSESSMENT:  1. Pulmonary hypertension.  She does have evidence of pulmonary      arterial hypertension.  Her mean transpulmonary gradient is 24,      with a pulmonary vascular resistance of about 5 Woods Units.  I      suspect, as does Dr. Shelle Iron, that much of this is due to her      obesity and sleep apnea.  She does also have concomitant diastolic      heart failure, which seems to be better controlled.  Now that she      is better diuresed, we will go ahead and get an echocardiogram.  If      her pulmonary pressures are still up, I do think she might benefit      from a pulmonary artery vasodilator such as Revatio or Tracleer.  I      will review this with Dr. Shelle Iron.  2. Apparent history of left ventricular dysfunction.  I have actually      never seen her  echocardiogram that reported an EF of 15% or 20%.      At catheterization, her EF appeared normal, with maybe some      anterior wall hypokinesis.  We will get another echocardiogram to      check.  Should there be any evidence of LV dysfunction, I do think      it would be reasonable to continue to titrate her ACE inhibitor and      beta blocker.  3. Hypertension.  It is controlled at this point.  4. Exertional leg pain.  I am not sure if this is just due to fatigue      or if she may have a component of claudication.  We will check ABIs      and lower extremity ultrasound.   DISPOSITION:  We will await the results of her echocardiogram, and I  will discuss with Dr. Shelle Iron.     [name], MD  Electronically Signed    DRB/MedQ  DD: 04/05/2007  DT: 04/06/2007  Job #: [id number]   cc:   Barbaraann Share, MD,FCCP  Aida Puffer

## 2010-12-02 NOTE — Assessment & Plan Note (Signed)
[facility]                             PULMONARY OFFICE NOTE   CASHAE, WEICH                      MRN:          [id number]  DATE:02/08/2007                            DOB:          January 31, 1946    SUBJECTIVE:  [name] comes in today for follow-up of her obesity  hypoventilation syndrome which is being treated with oxygen as well as  BiPAP therapy.  She was referred for a recent heart catheterization  after finding on echocardiogram an ejection fraction of 15-25%.  This  was done by Dr. Gala Romney on December 20, 2006 and showed a pulmonary artery  occlusion pressure of 21 with a PAD of 23.  PPR by my calculations is  approximately 3.8 Wood units.  What is surprising, however, is that when  she had a ventriculogram done this showed an EF of 55% with only a  question of a mild distal anterior wall hypokinesis where the echo  showed diffuse hypokinesis.  The patient returns today where she feels  that she is breathing much better since being on consistent diuretic  therapy as well as her BiPAP.  Her weight is being maintained, although  I told her she needs to try to work on losing weight.   PHYSICAL EXAMINATION:  GENERAL:  She is a morbidly obese female in no  acute distress.  Blood pressure is 140/80, pulse 70, temperature is 97.8, weight is 264  pounds.  Desaturation on O2 is 91-92%.  CHEST:  Reveals minimal basilar crackles, otherwise is clear.  CARDIAC:  Reveals regular rate and rhythm.  EXTREMITIES:  Lower extremities with trace edema.   IMPRESSION:  1. Chronic respiratory failure secondary to obesity hypoventilation      syndrome.  The patient has had a very good response to oxygen,      diuretic therapy, as well as BiPAP.  She is sleeping well through      the night and feels fairly rested in the mornings and also during      the day.  We are at least maintaining her O2 saturations      consistently, both day and night in an optimal range.  2. Questionable cardiomyopathy.  The data currently is somewhat      conflicted with an echo showing a depressed ejection fraction with      wall motion abnormalities and a  ventriculogram which shows an EF      of 55%.  The patient's pulmonary artery occlusion pressure was      fairly elevated at 21 at the time of right heart catheterization,      therefore, this would give some credence to depressed LV function.      At this point in time I am unsure what to make of the conflicting      data and would like to discuss the case again with Dr. Gala Romney.  3. 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  The patient has almost no pulmonary artery diastolic      to pulmonary artery occlusion pressure gradient, and  the PAOP is      definitely in a very elevated range.  The patient does have an      elevated pulmonary vascular resistance; however, I would not even      consider vasodilator therapy until her left-sided pressures have      been decreased appropriately.  She also needs to continue on her      oxygen and BiPAP for a longer period of time with the hope of      improving things.   PLAN:  1. Continue on diuretics, oxygen, and BiPAP q.h.s.  2. Will check BMET today.  3. Will discuss patient's case further with Dr. Gala Romney regarding      her LV function.  4. The patient will follow up three months or sooner if there are      problems.     Barbaraann Share, MD,FCCP  Electronically Signed    [facility]/MedQ  DD: 02/08/2007  DT: 02/08/2007  Job #: [id number]   cc:   Cleone Slim. Bensimhon, MD

## 2010-12-02 NOTE — Cardiovascular Report (Signed)
NAME:  [name], [name]               ACCOUNT NO.:  [id number]   MEDICAL RECORD NO.:  [id number]          PATIENT TYPE:  OIB   LOCATION:  1961                         FACILITY:  [facility]   PHYSICIAN:  [name], MDDATE OF BIRTH:  1945-09-02   DATE OF PROCEDURE:  12/20/2006  DATE OF DISCHARGE:                            CARDIAC CATHETERIZATION   IDENTIFICATION:  [name] is a delightful 65 year old woman with morbid  obesity as well as hypertension, diabetes, severe obstructive sleep  apnea.  She recently underwent a workup for hypoxemia.  Echocardiogram  showed a question of left ventricular dysfunction.  She is referred for  diagnostic right and left heart catheterization.   PROCEDURES PERFORMED:  1. Right heart cath.  2. Left heart cath.  3. Left ventriculogram.  4. Selective coronary angiography.   DESCRIPTION OF PROCEDURE:  The risks and benefits of catheterization  were explained.  Consent was signed and placed on the chart.  A 4-French  arterial sheath was placed in the right femoral artery using the  modified Seldinger technique.  Standard catheters including a JL-4, 3-  DRC and angled pigtail were used for the procedure.  All catheter  exchanges were made over wire with no apparent complications.  A 7-  French venous sheath was placed in the right femoral vein, and a  standard Swan-Ganz catheter was used for the procedure.   HEMODYNAMICS:  Right atrial pressure mean of 9, RV pressure 74/5, PA  pressure 70/23 with a mean of 45.  Pulmonary capillary wedge pressure  with a mean of 21 with a V-wave to 27.  Central aortic pressure is  196/78 with a mean of 128.  LV pressure was 179/80 with an EDP of 18.  Fick cardiac output was 4.7 liters per minute with a Fick cardiac index  of 2.2 liters per minute per meter squared.  Pulmonary vascular  resistance was 5.1 Woods units.   CORONARY ANATOMY:  Left main - angiographically normal.   LAD coursed distally to the apex.  It  gave off a moderate-sized first  diagonal and a very long second diagonal.  There is a 30% tubular  stenosis in the proximal LAD.  In the ostium of the first diagonal was a  40-50% lesion.   The left circumflex was a moderate-sized vessel.  It gave off a small  ramus branch, a small OM-1, a small OM-2, a large OM-3 and a small  posterolateral.  There was a 30% lesion osteal and a 50% lesion in the  mid section of the AV groove circumflex.   The coronary artery disease was a large dominant vessel.  It gave off an  RV branch, a large PDA, a large first and second posterolateral and a  small third posterolateral.  There was a 30% proximal stenosis in the  RCA and a 30% stenosis around the distal bend, as well as a 30% stenosis  in the distal portion after the takeoff of the PDA.   Left ventriculogram done in the RAO position showed an EF of  approximately 55% with a question of mild distal anterior wall  hypokinesis, though it was difficult to tell due to significant  ventricular ectopy.   ASSESSMENT:  1. Mild nonobstructive coronary artery disease as described above.  2. Normal left ventricular function with question of distal anterior      wall hypokinesis.  3. Severe pulmonary arterial hypertension with only minimally elevated      left sided filling pressures.  4. Severe systemic hypertension.   PLAN/DISCUSSION:  [name] will need aggressive medical and behavioral  therapy for her severe pulmonary hypertension to include weight loss,  oxygen supplementation, treatment of her obstructive sleep apnea with  CPAP and possibly Tracleer.  She will follow up with Dr. Shelle Iron to  address this further, and Dr. Delton Coombes and I will be happy to help in the  pulmonary hypertension clinic as needed.  She will also need aggressive  treatment of her blood pressure.      [name], MD  Electronically Signed     DRB/MEDQ  D:  12/20/2006  T:  12/20/2006  Job:  [id number]   cc:   Barbaraann Share, MD,FCCP

## 2010-12-02 NOTE — Assessment & Plan Note (Signed)
[name]                            CARDIOLOGY OFFICE NOTE   Dana, [name]                      MRN:          [id number]  DATE:12/16/2006                            DOB:          [birth year]    PRIMARY CARE PHYSICIAN:  Dr. Aida Puffer.   PULMONOLOGIST AND REFERRING PHYSICIAN:  Dr. Marcelyn Bruins.   REASON FOR CONSULTATION:  LV dysfunction.   HISTORY OF PRESENT ILLNESS:  [name] is a delightful 65 year old woman  with a history of obesity, diabetes, severe obstructive sleep apnea,  hypertension, and previous colon cancer who is referred by Dr. Shelle Iron  for evaluation of LV dysfunction.   She tells me that last year she was diagnosed with congestive heart  failure. She had an episode of leg swelling and severe abdominal  distension, and she was told that she had heart failure. She denies any  shortness of breath at that time. It does not appear that she had an  echocardiogram then, but I do not have those records. She went to see  Dr. Clarene Duke several months ago and felt very fatigued it was noted. He  checked her oxygen saturations and she had some hypoxemia. She was  subsequently referred to Dr. Shelle Iron. Interestingly, she really denies a  sense of dyspnea, but says that she has noticed a significant difference  in her exercise tolerance since starting the home oxygen. She underwent  VQ scan which showed evidence of air trapping, but no evidence of  pulmonary embolus. An echocardiogram was ordered and this was  interpreted as severe LV dysfunction with an EF of 15-20%. It is also  noted that she had some RV and right atrial enlargement.   She denies any chest pain. She does say that she gets fatigued after  just walking a few blocks. She has had some problems with lower  extremity edema, but this comes and goes. She does have sleep apnea and  is compliant with her CPAP. She denies orthopnea or PND.   REVIEW OF SYSTEMS:  Notable for  fatigue, anxiety, and arthritis pain.  The remainder of the review of systems is as per HPI and problem list.   PROBLEM LIST:  1. Hypoxemia, as described above.  2. Hypertension.  3. History of congestive heart failure.  4. History of superficial lower extremity DVT.  5. Severe sleep apnea on CPAP.  6. History of colon cancer, status post resection.  7. Diabetes x10 years.   CURRENT MEDICATIONS:  1. Januvia 100 mg daily.  2. Hydrochlorothiazide 50 mg daily.  3. K-Dur 20 mEq daily.  4. Lorazepam 1 b.i.d.  5. Coreg 3.125 b.i.d.  6. Aceon 4 mg daily.  7. Zoloft 50 mg daily.  8. Aspirin 325 mg daily.  9. Lantus insulin as needed.  10.Lasix 40 mg daily.   ALLERGIES:  DEMEROL and CODEINE.   SOCIAL HISTORY:  She is married and has three children. She has never  smoked. She is currently on disability.   FAMILY HISTORY:  Mother is alive at 3 and is actually present today in  the office. Father  died at 43 due to an acute myocardial infarction.  Sister is alive and well at 34. She is also here. Two brothers, 92 and  33, with no coronary artery disease.   PHYSICAL EXAMINATION:  GENERAL:  She is a morbidly obese in no acute  distress. She ambulates around the clinic without any respiratory  difficulty. She does have her oxygen.  VITAL SIGNS:  Blood pressure 140/78, heart rate 74, weight 259.  HEENT:  Normal.  NECK:  Thick. Difficult to assess for JVD, but appears to be about 7 cm  of water. There is no lymphadenopathy or thyromegaly.  CARDIAC:  Unable to palpate her PMI. She has a regular rate and rhythm,  no murmurs, rubs, or gallops appreciated.  LUNGS:  Decreased breath sounds throughout, but no wheezes or rales.  ABDOMEN:  Obese, nontender, nondistended, no evidence of  hepatosplenomegaly appreciated. No bruits, no masses, good bowel sounds.  EXTREMITIES:  Warm with no cyanosis or clubbing, there is 1+ edema  bilaterally, no cords, she does have marked hypertrophy of her calf   muscles.  NEUROLOGIC:  Awake, alert, and oriented x3, cranial nerves II-XII are  intact, she can use all four extremities without difficulty, affect is  pleasant.   EKG shows normal sinus rhythm with a left anterior fascicular block,  rate of 74 beats-per-minute. There are no significant STT wave  abnormalities.   I have reviewed her echocardiogram personally. This is a very difficult  study to interpret given the poor image quality. However, ejection  fraction appears to be in the 30-35% range and not in 15-20%. There is  evidence of significant right sided enlargement.   ASSESSMENT AND PLAN:  1. Chronic respiratory failure. I agree with Dr. Shelle Iron that given her      hypoxemia, the primary issue here appears to be due to her obesity      hypoventilation syndrome, sleep apnea, and possibly a component of      pulmonary hypertension. We will proceed with right heart cath in      order to evaluate her pressures.  2. Congestive heart failure secondary to LV dysfunction, as above, I      think that her EF is probably more in the 30-35% range. Given her      risk factors of coronary artery disease, I have suggested      proceeding with left heart catheterization, as well as right heart      catheterization. We have discussed the risks and benefits in great      detail and we have agreed to proceed. We will also need to see her      back to continue to titrate her ACE inhibitor and beta blocker as      tolerated. Currently, her volume status looks okay.  3. Hypertension. This is borderline controlled. She will need      aggressive management of this as well.   DISPOSITION:  Pending the results of her catheterization, we will see  her back in several weeks.     [name], MD  Electronically Signed    DRB/MedQ  DD: 12/16/2006  DT: 12/17/2006  Job #: [id number]

## 2010-12-02 NOTE — Letter (Signed)
June 06, 2007    [name], MD  [address], [place] [address]   RE:  SHIANNA, BALLY  MRN:  [id number]  /  DOB:  [birth year]   Dear Jesusita Oka:   Thanks for asking me to evaluate Ms. [place] Dias for her lower  extremity peripheral arterial disease.  Ms. Bruyere is a delightful 65-year-  old woman with morbid obesity, diabetes, hypertension and obstructive  sleep apnea.  She has a nonischemic cardiomyopathy but there is some  question about the degree of LV dysfunction that she had.  She has  complained of exertional leg pain and a lower extremity Duplex scan with  ABIs was performed.  Her left ABI was not calculated due to medial  calcification.  The right ABI appeared normal at 1.  Total brachial  indices are abnormal but they are adequate for healing.  Her Doppler  study showed brisk triphasic flow throughout both lower extremities.  There was a focal area of left popliteal artery stenosis.  Otherwise,  there are no areas of focal stenosis.   From a symptomatic standpoint, Ms. Othman complains of several years of  bilateral leg pain with walking.  She first noticed this 4-5 years ago  and describes her pain as progressive over the time period.  The pain  equally affects the right and left leg.  She describes the pain as sharp  and located over the anterior aspect of the lower legs as well as the  thighs and calves.  She has occasional pain at rest.  She has not had  any ulcerations or areas of skin breakdown.  She has no history of  stroke.   PAST MEDICAL HISTORY:  Pertinent for  1. Essential hypertension.  2. History of congestive heart failure.  3. Diabetes.  4. Left leg DVT.  5. Severe obstructive sleep apnea.  6. Colon cancer status post surgery.  7. Cholecystectomy.  8. Hysterectomy.  9. Appendectomy.   CURRENT MEDICATIONS:  1. Januvia 100 mg daily.  2. Hydrochlorothiazide 50 mg daily.  3. Lorazepam 1 mg twice daily.  4. Coreg 3.125 mg twice  daily.  5. Aceon 8 mg daily.  6. Zoloft 100 mg one half daily.  7. Aspirin 325 mg daily.  8. CPAP at bedtime.  9. Lantus insulin as directed.  10.NovoLog as directed.  11.Lasix 40 mg daily.  12.K-Dur 10 mEq four twice daily.  13.BiPAP used at bedtime.   ALLERGIES:  DEMEROL and CODEINE.   SOCIAL HISTORY:  The patient is married with children.  She is a  nonsmoker.  She is on disability.   FAMILY HISTORY:  Mother is alive and while her father died at 49 of an  acute MI.  She has several siblings who have no significant coronary or  vascular disease.   REVIEW OF SYSTEMS:  A complete 12-point review of systems was performed.  Pertinent positives included generalized fatigue, arthritis pain and  anxiety.   PHYSICAL EXAMINATION:  VITAL SIGNS:  Weight 265, blood pressure 150/60  in the right arm, 152/62 in the left arm.  Heart rate 80, respiratory  rate 16.  GENERAL APPEARANCE:  The patient is alert and oriented, she is in no  acute distress.  She has marked obesity.  HEENT:  Normal.  NECK:  Normal carotid upstrokes without bruits.  Jugular venous pressure  normal. There is no thyromegaly or thyroid nodules.  LUNGS:  Clear to auscultation bilaterally.  CARDIOVASCULAR:  The apex is  not palpable.  Heart is regular rate and  rhythm without murmurs or gallops.  ABDOMEN:  Soft, obese, nontender, no bruits.  EXTREMITIES:  No clubbing, cyanosis, or edema.  Dorsalis pedis pulses  are 2+ bilaterally.  Femoral pulses are 2+ bilaterally without bruits.  SKIN:  Warm and dry without rash.  There are some ant bites on the  dorsum of the right foot.  There is no evidence of surrounding erythema  or drainage.  There are no ischemic ulcerations.  NEUROLOGIC:  Cranial nerves II-XII intact.  Strength intact and equal.   ASSESSMENT:  Ms. Ala Dach is a 65 year old diabetic woman with peripheral  arterial disease as detailed above.  She has predominantly atypical  symptoms.  It does not appear that she  has any focal disease involving  the right leg and her arterial wave forms are brisk in both legs.  She  does have severe focal stenosis in the left popliteal region but with  equal symptoms in both legs, it is difficult to implicate this as a  cause of her symptoms.  I think she would be best served by continued  aggressive medical therapy and secondary risk reduction measures.  I  cannot find a copy of her lipids but she tells me they are closely  followed by her primary care physician.  Her goal LDL would clearly be  less than 100 and ideally less than 70 in the setting of diabetes and  vascular disease.  I do not think she requires any specific follow-up  regarding her peripheral arterial disease but if she develops any change  in symptoms, I would be happy to see her back in clinic.  With  predominantly atypical symptoms, I deferred treatment with Pletal,  especially in the setting of her history of cardiomyopathy even though  her left ventricular function is now normalized.   Jesusita Oka, thanks again for asking me to see Ms. Varnum.  Please feel free to  contact me at any time with questions regarding her care.    Sincerely,      Veverly Fells. Excell Seltzer, MD  Electronically Signed    MDC/MedQ  DD: 06/06/2007  DT: 06/07/2007  Job #: [id number]   CC:   Barbaraann Share, MD,FCCP  Aida Puffer

## 2010-12-02 NOTE — Assessment & Plan Note (Signed)
[facility]                            CARDIOLOGY OFFICE NOTE   CLASSIE, WENG                      MRN:          [id number]  DATE:09/13/2007                            DOB:          01/28/1946    PRIMARY CARE PHYSICIAN:  Dr. Aida Puffer.   PULMONOLOGIST:  Dr. Marcelyn Bruins.   INTERVAL HISTORY:  [name] is a delightful 65 year old woman with a  history of morbid obesity as well as obesity/hypoventilation syndrome,  hypertension, sleep apnea and diabetes.   We saw her last year with a question of LV dysfunction.  Echocardiogram  read as an EF of 15-20%.  She underwent cardiac catheterization which  showed minimal nonobstructive coronary disease with a normal EF. She has  components of both pulmonary venous and pulmonary arterial hypertension  with a pulmonary vascular resistance of 5.1 Woods units.   She returns today for routine follow-up.  She says she is doing great.  She is doing her gardening and other things around the house without any  significant dyspnea.  She feels much better.  She has able to come off  her oxygen at rest during the day but still wears it for exertion and  with her CPAP at night.  She has been very faithful with this.  Unfortunately,  she has not able to lose weight.  She denies any  orthopnea, PND.  She has had some mild lower extremity edema.   She had an echocardiogram in October to reassess her pulmonary  pressures.  EF was 50-55%.  The right side was normal.  There was no  significant tricuspid regurgitation to assess her pulmonary pressures.   CURRENT MEDICATIONS:  1. Januvia 100 a day  2. Hydrochlorothiazide 50 a day  3. Lorazepam 1 mg a day  4. Coreg 3.125 b.i.d.  5. Aceon 8 mg a day  6. Zoloft 50 mg  7. Aspirin 325  8. CPAP  9. Insulin  10.Lasix 40 a day  11.Potassium 40 b.i.d.   PHYSICAL EXAM:  She is an obese woman, in no acute distress, ambulates  around the clinic without any respiratory  difficulty.  Blood pressure is  122/62, heart 74, weight is 270.  HEENT is normal.  NECK is supple and thick, somewhat hard to assess JVD but appears flat.  Carotids are 2+ bilaterally without  bruits.  There is no  lymphadenopathy or thyromegaly.  CARDIAC:  She has distant heart sounds.  She is regular, she has an S4,  no murmur.  PMI is hard to palpate  LUNGS:  Clear.  ABDOMEN is obese, nontender, nondistended.  Unable to evaluate for  hepatosplenomegaly, no bruits, no masses.  EXTREMITIES:  Warm with a marked calf hypertrophy.  There is no cyanosis  or clubbing.  She has 2+ a pitting edema bilaterally.  No rash.  NEURO:  Alert and oriented x3.  Cranial nerves II-XII are intact.  Moves  all four extremities without difficulty.  Affect is pleasant.   EKG shows normal sinus rhythm with right superior axis rate of 74.  No  significant ST-T wave abnormalities.  She does have a interventricular  conduction delay at 114 milliseconds.   ASSESSMENT/PLAN:  1. Pulmonary hypertension.  I suspect this is due primarily to her      obesity/hypoventilation syndrome.  It would be nice to reassess her      pressures at some point, however, I do not think we absolutely need      to go back to the cath at this time.  Will go ahead and get another      echocardiogram.  I do think her wedge pressure is probably still a      bit elevated.  We will add spironolactone 25 mg a day.  We will      also decrease her potassium to 40 in the morning and 20 at night.      Next week she will get the lab work including a BMET and a BNP to      further assess.  I did talk to her about the possibility of      repeating her right heart catheterization some time in the future      to evaluate.  It do not think she is a candidate for pulmonary      vasodilators at this point.  2. Hypertension, well-controlled.  3. Obesity. I once again reinforced the need for a weight loss which      would be crucial to managing her  pulmonary hypertension as well.   DISPOSITION:  We will see her back in 6 months for followup.  I will  discuss with Dr. Shelle Iron.     [name], MD  Electronically Signed    DRB/MedQ  DD: 09/13/2007  DT: 09/13/2007  Job #: [id number]   cc:   Barbaraann Share, MD,FCCP  Aida Puffer

## 2010-12-05 NOTE — H&P (Signed)
[place]  Patient:    [name], [name] Visit Number: [id number] MRN: [id number]          Service Type: END Location: ENDO Attending Physician:  Louie Bun Dictated by:   Everardo All Madilyn Fireman, M.D. Admit Date:  04/07/2001   CC:         Gita Kudo, M.D.   History and Physical  CHIEF COMPLAINT:  Abdominal pain, nausea and vomiting.  HISTORY OF PRESENT ILLNESS:  The patient is a 65 year old white female admitted for elective cholecystectomy and preoperative ERCP after presenting a week and a half ago with an episode of abdominal pain, nausea and vomiting, accompanied by jaundice and elevated liver function tests with a peak bilirubin of 7.2.  Ultrasound showed small gallstones, no dilated duct, no gallbladder wall thickening or pericholecystic fluid. She has had resolution of her presenting symptoms and gradual fall in liver function tests with last bilirubin 2.0 yesterday and all other liver function tests improving.  Amylase and lipase were normal.  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 She is admitted for cholecystectomy and operative cholangiogram tomorrow.  PAST MEDICAL HISTORY: 1. Colon cancer, status post right hemicolectomy two years ago. 2. Diabetes. 3. Anxiety. 4. Hypertension.  PAST SURGICAL HISTORY:  Deep venous thrombosis with thrombectomy in 1991. Hysterectomy in 1981.  ALLERGIES:  DEMEROL and CODEINE.  FAMILY HISTORY:  Negative for GI malignancy.  SOCIAL HISTORY:  The patient is a bookkeeper.  She is married.  She denies alcohol or tobacco use.  PHYSICAL EXAMINATION:  GENERAL APPEARANCE:  A well-developed, well-nourished, moderately overweight white female in no acute distress.  LUNGS:  Clear.  CARDIOVASCULAR:  Regular rate and rhythm without murmur.  ABDOMEN:  Soft, nondistended with normal active bowel sounds, no hepatosplenomegaly, mass  or guarding. There is a well-healed midline surgical scar without herniation.  IMPRESSION:  Symptomatic gallstones with possible common bile duct stones.  PLAN:  Admit, recheck liver function tests tomorrow, cholecystectomy either laparoscopically or open tomorrow, depending on status of adhesions from previous surgery and presence or absence of common bile duct stones. Dictated by:   Everardo All Madilyn Fireman, M.D. Attending Physician:  Louie Bun DD:  04/07/01 TD:  04/07/01 Job: [id number]

## 2010-12-05 NOTE — Procedures (Signed)
NAME:  [name], [name]               ACCOUNT NO.:  [id number]   MEDICAL RECORD NO.:  [id number]          PATIENT TYPE:  OUT   LOCATION:  SLEEP CENTER                 FACILITY:  [facility]   PHYSICIAN:  Clinton D. Maple Hudson, MD, FCCP, FACPDATE OF BIRTH:  July 03, 1945   DATE OF STUDY:  06/30/2006                            NOCTURNAL POLYSOMNOGRAM   INDICATIONS FOR PROCEDURE:  Hypersomnia with sleep apnea.   EPWORTH SLEEPINESS SCORE:  9/24. BMI 42.6. Weight 250 pounds.   HOME MEDICATIONS:  List is reviewed. The patient uses oxygen at 2 liters  per minute at night and came with signed note, specifying the diagnostic  study to be done with 2 liters of oxygen. The technician interpreted  this as an order for a diagnostic study.   SLEEP ARCHITECTURE:  Short total sleep time 273 minutes with sleep  efficiency 72%. Stage 1 was 8%; Stage 2 was 92%; Stage 3, 4, and REM  were absent. Sleep latency 51 minutes. Awake after sleep onset 53  minutes. Arousal index increased at 66.2, indicating increased sleep  fragmentation. No bedtime medication was reported. The patient described  difficulty sleeping supine, preferring to sleep on her sides.   RESPIRATORY DATA:  Diagnostic NP SG is requested, apnea hypopnea index  (AHI, RDI) 118.7 obstructive events per hour, indicating severe  obstructive sleep apnea/hypopnea syndrome. There were 202 obstructive  apnea's, 338 hypopnea's. The events were not positional, equally  associated with left and right sleep positions. REM AHI NA.   OXYGEN DATA:  The patient slept wearing oxygen at 2 liters per minute.  There was moderate to loud snoring with oxygen desaturation to a nadir  of 80%. The oxygen saturation through the study was 91% on 2 liter nasal  prongs.   CARDIAC DATA:  Sinus rhythm with frequent PVC's.   MOVEMENT/PARASOMNIA:  No significant limb jerk or unusual movement.   IMPRESSION/RECOMMENDATION:  1. Short fragmented sleep time, deficient in REM, probably  reflecting      sleep disturbance from sleep apnea.  2. Severe obstructive sleep apnea/hypopnea syndrome, AHI 118.7 per      hour with significant events in both lateral sleep positions. Loud      snoring with oxygen desaturation to a nadir of 80% despite wearing      2 liter prongs through the study.  3. The technician performed this study as a diagnostic NP SG, based on      her interpretation of the written request. Consider return for CPAP      titration or evaluate for alternative therapies as appropriate.      Clinton D. Maple Hudson, MD, [facility], FACP  Diplomate, Biomedical engineer of Sleep Medicine  Electronically Signed     CDY/MEDQ  D:  07/04/2006 11:02:33  T:  07/04/2006 20:11:25  Job:  [id number]

## 2010-12-05 NOTE — Discharge Summary (Signed)
[name]. [facility]  Patient:    [name], [name] Visit Number: [id number] MRN: [id number]          Service Type: SUR Location: [id number] Attending Physician:  Louie Bun Dictated by:   Gita Kudo, M.D. Admit Date:  04/07/2001 Discharge Date: 04/11/2001   CC:         John C. Madilyn Fireman, M.D.   Discharge Summary  CHIEF COMPLAINT:  Abdominal pain, jaundice.  HISTORY OF PRESENT ILLNESS:  This 65 year old female is admitted following ERCP.  She underwent colon resection for a right colon cancer by myself approximately two years ago.  At a recent trip to [facility] she became jaundiced and had abdominal pain, and was seen in their ER and admitted.  The workup of gallbladder ultrasound did not reveal any abnormalities and her liver function studies and bilirubin were markedly elevated in the range of 7.2.  She came back to [facility] and Dr. Madilyn Fireman worked her up, and did an ERCP on September 19 and was unable to get into the duct because of a large duodenal diverticulum.  LABORATORY STUDIES:  Pathology - report not back, gallbladder subacutely inflamed.  An operative cholangiogram showed filling of the cystic duct with a little extravasation.  The common bile duct is partially filled.  I did not fill the intrahepatic ducts.  There was a diverticulum of the duodenum, although could have been a choledochal cyst of the common bile duct.  There was good contrast entering the duodenum and no common duct stones.  Chest x-ray showed no evidence of acute disease.  Ultrasound showed multiple small gallstones in the gallbladder.  EKG was interpreted as normal rhythm with a possible left anterior block.  Laboratory studies also showed initial enzymes of SGOT 127 that dropped to 76 and then to 56.  SGPT 297, dropping to 294 and then down to 145.  Alk phos initially 297, dropping to 249 and then down to 130.  Bilirubin originally 3.5 then dropped  down to 2.0 and 2.1.  HOSPITAL COURSE:  After the patients ERCP, she was admitted overnight and the next day underwent surgery.  I attempted laparoscopic approach but this was not successful because of the previous surgery and she was converted to an open cholecystectomy.  An operative cholangiogram was also performed.  She tolerated the procedure quite well.  Postoperatively she had no complications. She was ambulatory, her wound looked fine, she regained bowel function, and she was discharged on postoperative day #3.  She was allowed home on a regular diet - with diabetic precautions, she is prescribed Maxidone for pain, and will be followed in the office in seven to ten days for staple removal.  DISCHARGE DIAGNOSIS:  Cholecystitis, passage of common duct stone.  OPERATIONS: 1. Cholecystectomy with operative cholangiogram on April 08, 2001. 2. On April 07, 2001 ERCP - Dr. Dorena Cookey.  COMPLICATIONS, INFECTIONS, CONSULTATIONS:  None.  CONDITION AT DISCHARGE:  Good. Dictated by:   Gita Kudo, M.D. Attending Physician:  Louie Bun DD:  04/11/01 TD:  04/11/01 Job: [id number]

## 2010-12-05 NOTE — Op Note (Signed)
   NAME:  [name], [name]                         ACCOUNT NO.:  [id number]   MEDICAL RECORD NO.:  [id number]                   PATIENT TYPE:  AMB   LOCATION:  ENDO                                 FACILITY:  [facility]   PHYSICIAN:  John C. Madilyn Fireman, M.D.                 DATE OF BIRTH:  July 13, 1946   DATE OF PROCEDURE:  03/16/2003  DATE OF DISCHARGE:                                 OPERATIVE REPORT   PROCEDURE:  Colonoscopy.   INDICATIONS FOR PROCEDURE:  History of cecal cancer status post right  hemicolectomy in 2000 with unrevealing surveillance colonoscopy in 2001.   DESCRIPTION OF PROCEDURE:  The patient was placed in the left lateral  decubitus position then placed on the pulse monitor with continuous low flow  oxygen delivered by nasal cannula. She was sedated with 50 mcg IV fentanyl  and 5 mg IV Versed. The Olympus video colonoscope was inserted into the  rectum and advanced to the ileocolonic anastomosis. There was no stenosis at  the anastomosis and no visible suspicion of neoplasm. No biopsies were taken  of the remaining transverse colon. Distally the anastomosis appeared normal  with no masses, polyps, diverticula or other mucosal abnormalities.  In the  descending and sigmoid colon, there were several scattered diverticula and  no other abnormalities. The rectum appeared normal and retroflexed view of  the anus revealed no obviously enlarged internal hemorrhoids. The scope was  then withdrawn and the patient returned to the recovery room in stable  condition. She tolerated the procedure well and there were no immediate  complications.   IMPRESSION:  Diverticulosis otherwise normal colonoscopy status post right  hemicolectomy.   PLAN:  Repeat study in five years.                                               John C. Madilyn Fireman, M.D.    JCH/MEDQ  D:  03/16/2003  T:  03/16/2003  Job:  [id number]   cc:   Gita Kudo, M.D.  [address]  [place]  [place] [address]  Fax: [phone]   Rose Phi. Myna Hidalgo, M.D.  [address] [facility]  [place], [place] [address]  Fax: [phone]   Othelia Pulling, M.D.  [address]  [place]  [place] [address]  Fax: [phone]   Gretta Cool, M.D.  [address] [place]  [place] [address]  Fax: [phone]

## 2010-12-05 NOTE — Procedures (Signed)
[facility]  Patient:    [name], [name]                      MRN: [id number] Proc. Date: 02/02/00 Adm. Date:  [id number] Attending:  Louie Bun CC:         Miquel Dunn. Catha Gosselin, M.D.             Gita Kudo, M.D.             Antony Madura, M.D.                           Procedure Report  PROCEDURE:  Colonoscopy.  INDICATION FOR PROCEDURE:  Cecal cancer removed one year ago.  DESCRIPTION OF PROCEDURE:  The patient was placed in the left lateral decubitus position and placed on the pulse monitor with continuous low-flow oxygen delivered by nasal cannula.  She was sedated with 62.5 mcg IV fentanyl and 8 mg IV Versed.  The Olympus video colonoscope was inserted into the rectum and advanced to the ileocolonic anastomosis, confirmed by suture material.  The prep was generally good but was suboptimal in the proximal colon and in the rectum, and I could not rule out small lesions less than 1 or 2 cm in diameter.  Otherwise, the area of the ileocolonic anastomosis as well as the remaining transverse and descending colon appeared normal with no masses, polyps, diverticula, or other mucosal abnormalities.  There were scattered diverticula seen in the sigmoid colon and no other abnormalities noted.  The rectum appeared normal, and the retroflexed view of the anus revealed no obvious internal hemorrhoids.  The colonoscope was then withdrawn and the patient returned to the recovery room in stable condition.  She tolerated the procedure well, and there were no immediate complications.  IMPRESSION:  Diverticulosis, otherwise normal colon status post right hemicolectomy, with slightly suboptimal prep in areas.  PLAN:  Continue surveillance by [facility] and will plan on repeating colonoscopy in three years. DD:  02/02/00 TD:  02/02/00 Job: [id number]

## 2010-12-05 NOTE — Procedures (Signed)
[place]. [facility]  Patient:    [name], VINAL Visit Number: [id number] MRN: [id number]          Service Type: END Location: ENDO Attending Physician:  Louie Bun Dictated by:   Everardo All Madilyn Fireman, M.D. Proc. Date: 04/07/01 Admit Date:  04/07/2001   CC:         Gita Kudo, M.D.   Procedure Report  PROCEDURE PERFORMED:  Attempted endoscopic retrograde cholangiopancreatography.  ENDOSCOPIST:  Everardo All. Madilyn Fireman, M.D.  INDICATIONS FOR PROCEDURE:  Suspected common bile duct stone with small gallstones seen on ultrasound.  Recent attack of abdominal pain, nausea and vomiting, associated with elevated liver function tests.  DESCRIPTION OF PROCEDURE:  The patient was placed in the prone position and placed on the pulse monitor with continuous low-flow oxygen delivered by nasal cannula.  She was sedated with 100 mcg of IV fentanyl and 10 mg IV Versed. The Olympus side-viewing endoscope was advanced blindly into the oropharynx, esophagus and stomach.  Cursory inspection of the stomach revealed no abnormality. The pylorus was traversed and the ampulla of Vater located on the medial duodenal wall.  It was recessed inside a deep diverticulum and the papilla was angled toward the right and I could not achieved a good en face approach with the side-viewing scope.  I attempted to cannulate the ampulla with a Montez Morita sphinctertome but due to difficult lie was unable to do so, I tried manipulating the patients position, put her in the left lateral decubitus position and supine position, tried abdominal pressure maneuvers and tried using a reversed sphinctertome but could not achieve an adequate line up for cannulation.  After multiple attempts were made, it was decided to abort the procedure.  The scope was then withdrawn and the patient returned to the recovery room in stable condition.  She tolerated the procedure well and there were no immediate  complications.  IMPRESSION: 1. Unsuccessful endoscopic retrograde cholangiopancreatography attempt due to    anatomic aberration with papilla recessed within the deep diverticulum and    angled to one side.  PLAN:  Admission for laparoscopic cholecystectomy as planned with intraoperative cholangiogram to rule out common bile duct stones. Dictated by:   Everardo All Madilyn Fireman, M.D. Attending Physician:  Louie Bun DD:  04/07/01 TD:  04/07/01 Job: [id number]

## 2010-12-05 NOTE — Op Note (Signed)
[place]  Patient:    [name], [name] Visit Number: [id number] MRN: [id number]          Service Type: SUR Location: [id number] Attending Physician:  Louie Bun Dictated by:   Gita Kudo, M.D. Proc. Date: 04/08/01 Admit Date:  04/07/2001   CC:         John C. Madilyn Fireman, M.D.  Dr. Claudie Revering, Department of Surgery, [facility], [place], [place]                           Operative Report  PROCEDURE:  Cholecystectomy with operative cholangiogram.  SURGEON:  Gita Kudo, M.D.  ASSISTANT:  Nelida Meuse, M.D.  ANESTHESIA:  General endotracheal anesthesia.  PREOPERATIVE DIAGNOSIS:  Cholecystitis, cholelithiasis, passed stones through common duct.  POSTOPERATIVE DIAGNOSIS:  Cholecystitis, cholelithiasis, passed stones through common duct.  CLINICAL SUMMARY:  The patient is a 65 year old female approximately 2 weeks after acute episode of right upper quadrant abdominal pain.  Two years ago she had right colectomy for cancer and her CEAs and CAT scans had been normal following that.  In [facility], she was admitted with an elevated bilirubin of approximately 7, and abnormal liver function studies.  Ultrasound did not show any abnormalities of the liver and did not show any stones.  She came back to [facility] for further treatment.  She was evaluated by Dr. Dorena Cookey and a repeat ultrasound here confirmed the presence of small stones but no evidence of an acute problem.  Her liver function studies began dropping and her bilirubin dropped slowly down to 2.1.  On April 07, 2001, a ERCP was done to clear the common duct preoperatively and it was unsuccessful because of technical difficulties with the patient having a duodenal diverticulum.  OPERATIVE FINDINGS:  A attempted laparoscopic approach was aborted when I could not obtain good access through an umbilical port.  The gallbladder  was distended and had patches of chronic inflammation.  There were several tiny stones in her cystic duct.  The operative cholangiogram showed no obstruction.  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  OPERATIVE PROCEDURE:  Under satisfactory general endotracheal anesthesia, having received 1.0 gram Ancef preoperative, the patients abdomen was prepped and draped in a standard fashion.  A transverse incision made above the umbilicus and the midline opened carefully into the peritoneum.  Finger dissection was used to be sure that I could feel the undersurface of the peritoneal cavity and operating Hasson port inserted, secured, and CO2 pneumoperitoneum established.  Upon attempting to place the camera, I was unable despite multiple manipulations, to find a free space.  The port was removed and gentle finger dissection used again to try and develop a plane. The port was replaced and again the camera placed, and I did not feel that we had accomplished anything and that it would be unwise to try pursue this further.  Accordingly CO2 was released and the midline closed with the figure-of-eight sutures and a second widely placed interrupted 0 Vicryl suture.  The skin was stapled.  A right subcostal incision was made from the midline to the AAL.  The patient was quite obese and I needed a long incision.  This was carried down into the peritoneal cavity, dividing the rectus muscle.  Bleeders were coagulated or tied with 2-0 silk.  Then the gallbladder was identified and the bowel packed away.  The superior portion  of the peritoneal surface was free but there was some adhesions to the inferior portion and these were freed to allow closure later and also for good exposure.  A self-retaining retractor was placed and packing used for good exposure. Because of the length of the gallbladder, I felt that it would be best to start from below upward and did so by placing a clamp on the  gallbladder and incising the peritoneum over the gallbladder and dissecting gallbladder from the liver bed sharply, with finger dissection and cautery.  In so doing, clips were placed on vessels as we encountered them, and then the cystic artery was identified, and controlled with multiple metal clips and divided.  By so doing, we gradually delineated the cyst duct as being the only structure left.  A clip was placed close to the gallbladder and a small incision made in the cystic duct.  Several tiny fragments of stone were milked out.  Then it was noted that the duct was quite small but a cook cholangiocath was placed and secured and good films obtained showing good emptying of the common duct into the duodenum and a diverticulum.  I could not really see the bifurcation proximally but I could see the common duct, cystic duct - hepatic duct junction, and felt it would not be necessary to try to do further study. The catheter was withdrawn and the gallbladder amputated.  The cystic duct stump was controlled with metal clips.  Then the abdomen was lavaged with saline.  The liver bed was checked for hemostasis by cautery.  The wound was closed without drainage in layers with running number 1 PDS.  Marcaine was used to infiltrate the subcutaneous and muscle and the skin edges approximated with staples. Sterile absorbent compressive dressings were applied.  The patient tolerated the procedure well.  Blood loss was negligible.  Total operation time was approximately 1 hour and 45 minutes. She went to the recovery room from the operating room in good condition. Dictated by:   Gita Kudo, M.D. Attending Physician:  Louie Bun DD:  04/08/01 TD:  04/09/01 Job: [id number]

## 2010-12-05 NOTE — Assessment & Plan Note (Signed)
[facility]                             PULMONARY OFFICE NOTE   Dana, [name]                      MRN:          [id number]  DATE:09/15/2006                            DOB:          1946/02/24    HISTORY OF PRESENT ILLNESS:  The patient is a very pleasant 65 year old  female who I have been asked to see for hypoxemia. The patient was found  to have decreasing O2 saturations for the last one year, and has noted  that it has been more difficult to control recently. She has been  diagnosed with very severe obstructive sleep apnea with a respiratory  disturbance index of 118 events per hour and her CPAP at night. She has  gotten to the point where she has less than one block dyspnea on  exertion and will get very winded making a bed or even bringing in one  bag of groceries from the car. She also has a history of congestive  heart failure, especially over the last 2 to 3 months with increasing  lower extremity edema. She feels that she has gained a lot of fluid  weight. She denies any chest pains and has had no shortness of breath at  rest. She has had no recent cardiac workup. Of note, she has never  smoked.   PAST MEDICAL HISTORY:  Significant for:  1. Hypertension.  2. History of congestive heart failure.  3. History of diabetes.  4. History of superficial left lower extremity deep vein thrombosis by      her description.  5. History of very severe obstructive sleep apnea for which she is on      CPAP.  6. Status post cholecystectomy, hysterectomy, appendectomy, and tubal      ligation.  7. History of colon surgery for cancer.   CURRENT MEDICATIONS:  1. Januvia 100 mg daily.  2. Hydrochlorothiazide 50 mg daily.  3. K-Dur 10 mEq two daily.  4. Lorazepam 1 mg b.i.d.  5. Coreg 3.125 b.i.d.  6. Aceon 4 mg daily.  7. Zoloft 100 mg one-half daily.  8. Aspirin 325 daily.  9. Lantus insulin as needed.  10.Furosemide 40 mg daily p.r.n.  only.   ALLERGIES:  DEMEROL AND CODEINE.   SOCIAL HISTORY:  She is married and has children. She has never smoked.  She is currently on disability.   FAMILY HISTORY:  Is non-contributory in first-degree relatives.   REVIEW OF SYSTEMS:  As per History of Present Illness and also see  Patient Intake Form documented in the chart.   PHYSICAL EXAMINATION:  In general, she is a morbidly obese white female  in no acute distress. Blood pressure is 142/82, pulse 70, temperature  97.7, weight 269 pounds. O2 saturation on room air is 88%; on 2 liters  it is 96%.  HEENT: Pupils equal, round and reactive to light and accommodation.  Extraocular muscles are intact. Nares: She has mild septal deviation to  the left. Oropharynx does show significant soft-tissue redundancy with  elongation of the soft palate and  uvula.  NECK: Is large and difficult to  assess for  JVD or lymphadenopathy.  There is no thyromegaly.  CHEST: Reveals decreased breath sounds, but no wheezes or crackles.  CARDIAC: Reveals regular rate and rhythm.  ABDOMEN: Is large, but soft and nontender with good bowel sounds.  GENITAL, RECTAL, BREASTS: Was not done and not indicated.  LOWER EXTREMITIES: Shows 1+ edema bilaterally. Pulses intact distally,  but decreased.  NEUROLOGIC: She was alert and oriented with no obvious deficits noted.   IMPRESSION:  Chronic respiratory failure that I suspect is secondary to  her obesity hypoventilation syndrome and element of sleep apnea and core  pulmonale. With her history of questionable DVT, I certainly cannot  exclude the possibility of chronic thromboembolic disease and this will  need to be worked out. Also, need to see if she is a chronic retainer  and whether or not there may be some superimposed airflow obstruction.  Obviously, she is going to need more aggressive diuresis.   PLAN:  1. Will obtain Helios portable oxygen through [facility] so that she      can wear it 24 hours a  day.  2. Will get her to take Lasix 40 mg q a.m. She is to have a BMET with      Dr. Clarene Duke next week and then followup with me approximately 2 to 3      weeks thereafter so we can make sure renal function is normal and      her potassium is not worsening.  3. Schedule for full PFTs as well as arterial blood gas. She is      ultimately going to need a VQ scan as well as an echo for further      workup.     Barbaraann Share, MD,FCCP  Electronically Signed    [facility]  DD: 10/13/2006  DT: 10/13/2006  Job #: [id number]   cc:   Aida Puffer

## 2010-12-22 ENCOUNTER — Other Ambulatory Visit: Payer: Self-pay | Admitting: Internal Medicine

## 2010-12-29 ENCOUNTER — Ambulatory Visit (INDEPENDENT_AMBULATORY_CARE_PROVIDER_SITE_OTHER): Payer: Medicare Other | Admitting: Internal Medicine

## 2010-12-29 ENCOUNTER — Encounter: Payer: Self-pay | Admitting: Internal Medicine

## 2010-12-29 VITALS — BP 136/80 | HR 71 | Ht 63.0 in | Wt 288.4 lb

## 2010-12-29 DIAGNOSIS — I6529 Occlusion and stenosis of unspecified carotid artery: Secondary | ICD-10-CM

## 2010-12-29 DIAGNOSIS — R609 Edema, unspecified: Secondary | ICD-10-CM | POA: Insufficient documentation

## 2010-12-29 DIAGNOSIS — R0602 Shortness of breath: Secondary | ICD-10-CM

## 2010-12-29 LAB — BASIC METABOLIC PANEL
CO2: 28 mEq/L (ref 19–32)
Calcium: 9.3 mg/dL (ref 8.4–10.5)
GFR: 57.56 mL/min — ABNORMAL LOW (ref >60.00)
Potassium: 5 mEq/L (ref 3.5–5.1)
Sodium: 140 mEq/L (ref 135–145)

## 2010-12-29 MED ORDER — POTASSIUM CHLORIDE CRYS ER 20 MEQ PO TBCR: 20.0000 meq | EXTENDED_RELEASE_TABLET | Freq: Every day | ORAL | Status: DC

## 2010-12-29 MED ORDER — METOLAZONE 2.5 MG PO TABS: 2.5000 mg | ORAL_TABLET | Freq: Every day | ORAL | Status: DC

## 2010-12-29 NOTE — Patient Instructions (Signed)
Zaroxolyn 2.5 mg take 1 tab today and 1 tom and then as needed Take Potassium 20 meq when you take EchoStar today and in 1 week (bmet, bnp 782.3) Your physician has requested that you have an echocardiogram. Echocardiography is a painless test that uses sound waves to create images of your heart. It provides your doctor with information about the size and shape of your heart and how well your heart's chambers and valves are working. This procedure takes approximately one hour. There are no restrictions for this procedure. Your physician has requested that you have a carotid duplex. This test is an ultrasound of the carotid arteries in your neck. It looks at blood flow through these arteries that supply the brain with blood. Allow one hour for this exam. There are no restrictions or special instructions. Your physician recommends that you schedule a follow-up appointment in: 3-4 weeks with Lorin Picket

## 2010-12-29 NOTE — Assessment & Plan Note (Signed)
Asymptomatic. Due for repeat u/s.  

## 2010-12-29 NOTE — Progress Notes (Signed)
HPI:  [place] is a delightful 65 y/o with history of morbid obesity complicated by obesity hypoventilation syndrome, hypertension, sleep apnea, diabetes, CRI and mild secondary pulmonary hypertension as well as hypokalemia.Carotid stenosis 40-59% bilaterally.   Initially, she had an echocardiogram read as severe LV dysfunction with an EF of 15-20%.  She underwent catheterization in 2008 that showed minimal nonobstructive coronary artery disease with EF 55%.  She had mild pulmonary hypertension with both pulmonary venous and pulmonary arterial components.  Her PVR was 5.1 Wood units.Echo in 3/09 EF 60%.  Saw Dr. Antoine Poche in 8/11 over question of possible AF. ECG with PACs. 24 monitor NSR with occ PACs and PVCS. No AF.  Here for routine f/u. Saw Dr. Clarene Duke 3 weeks ago with increasing edema and weight gain. Weight up 10-15 pounds. Lasix doubled to 40 bid without much benefit. Having problems with her balance. No neuro sx.  ROS: All systems negative except as listed in HPI, PMH and Problem List.  Past Medical History  Diagnosis Date  . OSA (obstructive sleep apnea)   . Thrombophlebitis of deep vein of leg   . DM (diabetes mellitus)   . CHF (congestive heart failure)     hx of EF 15-20% in 4/08,  echo 3/09 EF 60-65%  . Carotid stenosis     u/s 6/10: R 0-39%Ll 40-59%  . HTN (hypertension)   . Morbid obesity   . Pulmonary HTN     mild,  cath 6/08 with PVR 5.1  . Hypokalemia   . 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 Spinal stenosis     Current Outpatient Prescriptions  Medication Sig Dispense Refill  . amoxicillin (AMOXIL) 500 MG capsule Take 500 mg by mouth 2 (two) times daily. 3 days left       . aspirin (BAYER ASPIRIN) 325 MG tablet Take 325 mg by mouth daily.        . carvedilol (COREG) 3.125 MG tablet 1 by mouth qam only       . cyanocobalamin (,VITAMIN B-12,) 1000 MCG/ML injection Inject 1,000 mcg into the muscle once.        . Ferrous Sulfate 83 MG TABS  Take 1 tablet by mouth 2 (two) times daily.        . furosemide (LASIX) 40 MG tablet Take 40 mg by mouth 2 (two) times daily.       . hydrochlorothiazide 25 MG tablet Take 0.5 tablets (12.5 mg total) by mouth daily.  15 tablet  6  . insulin glargine (LANTUS) 100 UNIT/ML injection Use as directed 42 UNITS in the morning and 80 units at night      . insulin glulisine (APIDRA OPTICLIK) 100 UNIT/ML injection Inject 10 Units into the skin 3 (three) times daily before meals.        Marland Kitchen LORazepam (ATIVAN) 1 MG tablet Take 1 mg by mouth 2 (two) times daily.        . perindopril (ACEON) 8 MG tablet Take 8 mg by mouth daily.        . sertraline (ZOLOFT) 100 MG tablet Take 1/2 tab by mouth once daily       . simvastatin (ZOCOR) 40 MG tablet Take 40 mg by mouth at bedtime.        Marland Kitchen spironolactone (ALDACTONE) 25 MG tablet Take 25 mg by mouth daily.        . Vitamin D, Ergocalciferol, (DRISDOL) 50000 UNITS CAPS Take by  mouth. Take 2 capsules weekly       . DISCONTD: Cyanocobalamin 1000 MCG CAPS Take by mouth. monthly       . DISCONTD: Liraglutide (VICTOZA) 18 MG/3ML SOLN Inject into the skin. Use as directed       . DISCONTD: sitaGLIPtan (JANUVIA) 100 MG tablet Take 100 mg by mouth daily.           PHYSICAL EXAM: Filed Vitals:   12/29/10 1119  BP: 136/80  Pulse: 71   Gen: well appearing. no resp difficulty HEENT: normal Neck: supple. no JVD. Carotids 2+ bilat; no bruits. No lymphadenopathy or thryomegaly appreciated. Cor: PMI nondisplaced. Regular rate & rhythm. No rubs, gallops, murmur. Lungs: clear Abdomen: soft, nontender, nondistended. No hepatosplenomegaly. No bruits or masses. Good bowel sounds. Extremities: no cyanosis, clubbing, rash, 3+ edema R > L Neuro: alert & orientedx3, cranial nerves grossly intact. moves all 4 extremities w/o difficulty. affect pleasant   ECG: Sinus 71 + PACs iRBBB. Septal Qs. No ST-T wave abnormalities.     ASSESSMENT & PLAN:

## 2010-12-29 NOTE — Assessment & Plan Note (Signed)
Has significant LE edema. Probably multifactorial. Will add metolazone 2.5 for today and tomorrow and then prn. Check labs today and next week. Counseled her on not to overuse metolazone. Need to keep legs elevated. Doesn't think she can use compression hose. Repeat echo.

## 2011-01-06 ENCOUNTER — Other Ambulatory Visit (INDEPENDENT_AMBULATORY_CARE_PROVIDER_SITE_OTHER): Payer: Medicare Other | Admitting: *Deleted

## 2011-01-06 DIAGNOSIS — R0602 Shortness of breath: Secondary | ICD-10-CM

## 2011-01-06 DIAGNOSIS — R609 Edema, unspecified: Secondary | ICD-10-CM

## 2011-01-06 LAB — BASIC METABOLIC PANEL
CO2: 29 mEq/L (ref 19–32)
Calcium: 9.2 mg/dL (ref 8.4–10.5)
Chloride: 105 mEq/L (ref 96–112)
Creatinine, Ser: 1.1 mg/dL (ref 0.4–1.2)
Glucose, Bld: 225 mg/dL — ABNORMAL HIGH (ref 70–99)
Sodium: 140 mEq/L (ref 135–145)

## 2011-01-14 ENCOUNTER — Other Ambulatory Visit (HOSPITAL_COMMUNITY): Payer: Medicare Other | Admitting: Radiology

## 2011-01-14 ENCOUNTER — Encounter: Payer: Medicare Other | Admitting: Cardiology

## 2011-01-14 ENCOUNTER — Encounter: Payer: Medicare Other | Admitting: *Deleted

## 2011-01-26 ENCOUNTER — Ambulatory Visit: Payer: Medicare Other | Admitting: Physician Assistant

## 2011-01-27 ENCOUNTER — Ambulatory Visit (INDEPENDENT_AMBULATORY_CARE_PROVIDER_SITE_OTHER): Payer: Medicare Other | Admitting: Pulmonary Disease

## 2011-01-27 ENCOUNTER — Encounter: Payer: Self-pay | Admitting: Pulmonary Disease

## 2011-01-27 ENCOUNTER — Ambulatory Visit (HOSPITAL_COMMUNITY)
Admission: RE | Admit: 2011-01-27 | Discharge: 2011-01-27 | Disposition: A | Discharge status: Home / Self Care | Payer: Medicare Other | Source: Ambulatory Visit | Attending: Pulmonary Disease | Admitting: Pulmonary Disease

## 2011-01-27 DIAGNOSIS — M7989 Other specified soft tissue disorders: Secondary | ICD-10-CM | POA: Insufficient documentation

## 2011-01-27 DIAGNOSIS — I82409 Acute embolism and thrombosis of unspecified deep veins of unspecified lower extremity: Secondary | ICD-10-CM

## 2011-01-27 DIAGNOSIS — E678 Other specified hyperalimentation: Secondary | ICD-10-CM

## 2011-01-27 DIAGNOSIS — G4733 Obstructive sleep apnea (adult) (pediatric): Secondary | ICD-10-CM

## 2011-01-27 NOTE — Patient Instructions (Signed)
Will check ultrasound of your legs to make sure no clots Continue with bipap Work on weight loss followup with me in 6mos

## 2011-01-27 NOTE — Progress Notes (Signed)
  Subjective:    Patient ID: [name], female    DOB: Oct 31, 1945, 65 y.o.   MRN: [id number]  HPI The pt comes in today for f/u of her known OHS/osa.  She is wearing bipap compliantly with her oxygen, and feels she sleeps well at HS with adequate daytime alertness.  She denies any mask or pressure issues.  She is having increased swelling LE, and is working with cardiology on diuresis.  Her right LE is much worse than left, and she has not had recent U/S LE.     Review of Systems  Constitutional: Negative for fever and unexpected weight change.  HENT: Positive for rhinorrhea, sneezing and postnasal drip. Negative for ear pain, nosebleeds, congestion, sore throat, trouble swallowing, dental problem and sinus pressure.   Eyes: Positive for itching. Negative for redness.  Respiratory: Positive for cough and shortness of breath. Negative for chest tightness and wheezing.   Cardiovascular: Positive for leg swelling. Negative for palpitations.  Gastrointestinal: Negative for nausea and vomiting.  Genitourinary: Negative for dysuria.  Musculoskeletal: Positive for joint swelling.  Skin: Negative for rash.  Neurological: Negative for headaches.  Hematological: Bruises/bleeds easily.  Psychiatric/Behavioral: Negative for dysphoric mood. The patient is not nervous/anxious.        Objective:   Physical Exam Morbidly obese female in nad No skin breakdown or pressure necrosis from cpap mask Chest with bibasilar crackles, no wheezing Cor with rrr but distance LE with 3+ edema, Right >>>left, tender in calf Alert and oriented, moves all four       Assessment & Plan:

## 2011-01-27 NOTE — Assessment & Plan Note (Signed)
The pt is doing well with her bipap and oxygen at hs, and denies any worsening of her breathing.  She is working with cardiology regarding her diuresis.  I am concerned about the sudden swelling of RLE>>>LLE.  Will check venous dopplers for completeness.

## 2011-01-29 ENCOUNTER — Other Ambulatory Visit (HOSPITAL_COMMUNITY): Payer: Self-pay | Admitting: Internal Medicine

## 2011-01-29 DIAGNOSIS — R609 Edema, unspecified: Secondary | ICD-10-CM

## 2011-01-30 ENCOUNTER — Ambulatory Visit (HOSPITAL_COMMUNITY): Payer: Medicare Other | Attending: Family Medicine | Admitting: Radiology

## 2011-01-30 ENCOUNTER — Other Ambulatory Visit (HOSPITAL_COMMUNITY): Payer: Self-pay | Admitting: Family Medicine

## 2011-01-30 ENCOUNTER — Encounter (INDEPENDENT_AMBULATORY_CARE_PROVIDER_SITE_OTHER): Payer: Medicare Other | Admitting: *Deleted

## 2011-01-30 DIAGNOSIS — I6529 Occlusion and stenosis of unspecified carotid artery: Secondary | ICD-10-CM

## 2011-01-30 DIAGNOSIS — I059 Rheumatic mitral valve disease, unspecified: Secondary | ICD-10-CM | POA: Insufficient documentation

## 2011-01-30 DIAGNOSIS — R609 Edema, unspecified: Secondary | ICD-10-CM

## 2011-01-30 DIAGNOSIS — I079 Rheumatic tricuspid valve disease, unspecified: Secondary | ICD-10-CM | POA: Insufficient documentation

## 2011-01-30 DIAGNOSIS — R0609 Other forms of dyspnea: Secondary | ICD-10-CM | POA: Insufficient documentation

## 2011-01-30 DIAGNOSIS — I379 Nonrheumatic pulmonary valve disorder, unspecified: Secondary | ICD-10-CM | POA: Insufficient documentation

## 2011-01-30 DIAGNOSIS — R0989 Other specified symptoms and signs involving the circulatory and respiratory systems: Secondary | ICD-10-CM | POA: Insufficient documentation

## 2011-01-30 MED ORDER — PERFLUTREN PROTEIN A MICROSPH IV SUSP
0.5000 mL | Freq: Once | INTRAVENOUS | Status: AC
  Administered 2011-01-30: 0.5 mL via INTRAVENOUS

## 2011-02-02 ENCOUNTER — Ambulatory Visit (INDEPENDENT_AMBULATORY_CARE_PROVIDER_SITE_OTHER): Payer: Medicare Other | Admitting: Physician Assistant

## 2011-02-02 ENCOUNTER — Encounter: Payer: Self-pay | Admitting: Internal Medicine

## 2011-02-02 ENCOUNTER — Encounter: Payer: Self-pay | Admitting: Physician Assistant

## 2011-02-02 DIAGNOSIS — L0390 Cellulitis, unspecified: Secondary | ICD-10-CM | POA: Insufficient documentation

## 2011-02-02 DIAGNOSIS — E119 Type 2 diabetes mellitus without complications: Secondary | ICD-10-CM

## 2011-02-02 DIAGNOSIS — R609 Edema, unspecified: Secondary | ICD-10-CM

## 2011-02-02 LAB — BASIC METABOLIC PANEL
BUN: 34 mg/dL — ABNORMAL HIGH (ref 6–23)
CO2: 29 mEq/L (ref 19–32)
Chloride: 93 mEq/L — ABNORMAL LOW (ref 96–112)
Creatinine, Ser: 1.2 mg/dL (ref 0.4–1.2)
Glucose, Bld: 189 mg/dL — ABNORMAL HIGH (ref 70–99)

## 2011-02-02 MED ORDER — AMOXICILLIN-POT CLAVULANATE 875-125 MG PO TABS: ORAL_TABLET | ORAL | Status: DC

## 2011-02-02 MED ORDER — TORSEMIDE 20 MG PO TABS: ORAL_TABLET | ORAL | Status: DC

## 2011-02-02 NOTE — Assessment & Plan Note (Addendum)
Right lower extremity.  Will treat with augmentin 875 mg bid x 10 days.  Follow up with me on Friday.  Patient to go to hospital if feeling worse.

## 2011-02-02 NOTE — Assessment & Plan Note (Signed)
No improvement.  Probably multifactorial.  Will try changing lasix to demadex.  Will give demadex 40 mg in A and 20 mg in P on day 1, then 20 mg bid.  Also, take K+ 20 every other day.  Check bmet today.  Treat cellulitis.  See me on Friday with repeat bmet.

## 2011-02-02 NOTE — Progress Notes (Signed)
History of Present Illness: Primary Cardiologist:  Dr. Kathe Mariner Carol is a 65 y.o. female with a history of morbid obesity complicated by obesity hypoventilation syndrome, hypertension, sleep apnea, diabetes, CRI and mild secondary pulmonary hypertension as well as hypokalemia. Carotid stenosis 40-59% bilaterally.   Initially, she had an echocardiogram read as severe LV dysfunction with an EF of 15-20%. She underwent catheterization in 2008 that showed minimal nonobstructive coronary artery disease with EF 55%. She had mild pulmonary hypertension with both pulmonary venous and pulmonary arterial components. Her PVR was 5.1 Wood units. Echo in 3/09 EF 60%.   Saw Dr. Antoine Poche in 8/11 over question of possible AF. ECG with PACs. 24 monitor NSR with occ PACs and PVCS. No AF.   She saw Dr. Gala Romney recently with increased edema.  He added metolazone.  She had an echo last month:  EF 55-60%, mild LAE.  Carotid dopplers:  40-59% bilateral - needs follow up in one year.  She saw Dr. Shelle Iron and venous dopplers were negative for DVT bilaterally.  Labs:  (12/29/10):  K 5, creat 1.0 Labs:  (01/06/11):  K 4.5, creat 1.1  Legs are still swollen.  She notes redness as well.  Right leg is always bigger.  She notes pain.  Has had some fevers and chills.  No lightheadedness, nausea or vomiting.  Has pain in feet chronically.  Probably has neuropathy.  Metolazone has not helped any.  Her weights are the same.  No chest pain.  No significant dyspnea.  No PND.   Past Medical History  Diagnosis Date  . OSA (obstructive sleep apnea)   . Thrombophlebitis of deep vein of leg   . DM (diabetes mellitus)   . CHF (congestive heart failure)     hx of EF 15-20% in 4/08,  echo 3/09 EF 60-65%;   echo 6/12: EF 55-60%, mild LAE  . Carotid stenosis     u/s 6/10: R 0-39%Ll 40-59%;  u/s 7/12: 40-59% bilateral (repeat in 7/13)  . HTN (hypertension)   . Morbid obesity   . Pulmonary HTN     mild,  cath 6/08  with PVR 5.1  . Hypokalemia   . 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 Spinal stenosis   . Obesity hypoventilation syndrome     Current Outpatient Prescriptions  Medication Sig Dispense Refill  . APIDRA 100 UNIT/ML injection       . aspirin (BAYER ASPIRIN) 325 MG tablet Take 325 mg by mouth daily.        . carvedilol (COREG) 3.125 MG tablet 1 by mouth qam only       . cyanocobalamin (,VITAMIN B-12,) 1000 MCG/ML injection Inject 1,000 mcg into the muscle every 30 (thirty) days.       . Ferrous Sulfate 83 MG TABS Take 1 tablet by mouth 2 (two) times daily.        . furosemide (LASIX) 40 MG tablet Take 40 mg by mouth 2 (two) times daily.       . hydrochlorothiazide 25 MG tablet Take 0.5 tablets (12.5 mg total) by mouth daily.  15 tablet  6  . insulin glargine (LANTUS) 100 UNIT/ML injection Use as directed 42 UNITS in the morning and 80 units at night      . LORazepam (ATIVAN) 1 MG tablet Take 1 mg by mouth 2 (two) times daily.        . metolazone (ZAROXOLYN) 2.5 MG  tablet Take 1 tablet (2.5 mg total) by mouth daily. As needed  15 tablet  3  . perindopril (ACEON) 8 MG tablet Take 8 mg by mouth daily.        . sertraline (ZOLOFT) 100 MG tablet Take 1/2 tab by mouth once daily       . simvastatin (ZOCOR) 40 MG tablet Take 40 mg by mouth at bedtime.        Marland Kitchen spironolactone (ALDACTONE) 25 MG tablet Take 25 mg by mouth daily.        . Vitamin D, Ergocalciferol, (DRISDOL) 50000 UNITS CAPS Take by mouth. Take 2 capsules weekly       . DISCONTD: potassium chloride SA (K-DUR,KLOR-CON) 20 MEQ tablet       . amoxicillin-clavulanate (AUGMENTIN) 875-125 MG per tablet Take one twice a day for 10 days  20 tablet  0  . potassium chloride SA (K-DUR,KLOR-CON) 20 MEQ tablet Take one tablet every other day      . torsemide (DEMADEX) 20 MG tablet Take one tablet twice a day  60 tablet  6    Allergies: Allergies  Allergen Reactions  . Codeine   . Meperidine Hcl   . Metformin      Vital Signs: BP 132/62  Pulse 48  Ht 5\' 3"  (1.6 m)  Wt 289 lb 12.8 oz (131.452 kg)  BMI 51.34 kg/m2  PHYSICAL EXAM: Well nourished, well developed, in no acute distress HEENT: normal Neck: no JVD at 90 degrees Cardiac:  normal S1, S2; RRR; no murmur Lungs:  clear to auscultation bilaterally, no wheezing, rhonchi or rales Abd: soft, nontender, no hepatomegaly Ext: 1-2+ bilateral edema, right > left, weeping noted on right Skin: 2-3 open wounds noted on lower right leg with surrounding erythema, ankle with erythema with + calor Neuro:  CNs 2-12 intact, no focal abnormalities noted   ASSESSMENT AND PLAN:

## 2011-02-02 NOTE — Patient Instructions (Addendum)
Stop lasix(furosemide).  Start Demadex(torsemide) 20mg  twice a day. Take two 20mg  tablets the first time you take this, then take one twice a day.  Take KCL(potassium) 20 mEq every other day.  Do not take Metolazone,  Take Augmentin 875mg  twice a day for 10 days.  Lab today--BMP 782.3  Schedule an appt to see Brynda Rim on Friday.  Schedule lab on Friday when you see Tereso Newcomer.--BMP 782.3  Ask your primary care doctor about taking neurontin for your feet.

## 2011-02-02 NOTE — Assessment & Plan Note (Signed)
She has symptoms of neuropathy.  I have asked her to follow up with her PCP and inquire if she is a candidate for neurontin.

## 2011-02-03 ENCOUNTER — Telehealth: Payer: Self-pay | Admitting: *Deleted

## 2011-02-03 NOTE — Telephone Encounter (Signed)
Reviewing labs--per scott weaver--BMET reviwed with scott weaver--pt comimg in on Friday to see scott again--scott will address at this time--nt

## 2011-02-06 ENCOUNTER — Other Ambulatory Visit (INDEPENDENT_AMBULATORY_CARE_PROVIDER_SITE_OTHER): Payer: Medicare Other | Admitting: *Deleted

## 2011-02-06 ENCOUNTER — Ambulatory Visit (INDEPENDENT_AMBULATORY_CARE_PROVIDER_SITE_OTHER): Payer: Medicare Other | Admitting: Physician Assistant

## 2011-02-06 ENCOUNTER — Encounter: Payer: Self-pay | Admitting: Physician Assistant

## 2011-02-06 VITALS — BP 122/66 | HR 62 | Ht 63.0 in | Wt 283.1 lb

## 2011-02-06 DIAGNOSIS — L0291 Cutaneous abscess, unspecified: Secondary | ICD-10-CM

## 2011-02-06 DIAGNOSIS — M869 Osteomyelitis, unspecified: Secondary | ICD-10-CM

## 2011-02-06 DIAGNOSIS — L0390 Cellulitis, unspecified: Secondary | ICD-10-CM

## 2011-02-06 DIAGNOSIS — R609 Edema, unspecified: Secondary | ICD-10-CM

## 2011-02-06 LAB — CBC WITH DIFFERENTIAL/PLATELET
Basophils Absolute: 0 10*3/uL (ref 0.0–0.1)
HCT: 35.3 % — ABNORMAL LOW (ref 36.0–46.0)
Lymphocytes Relative: 14.9 % (ref 12.0–46.0)
Lymphs Abs: 1.7 10*3/uL (ref 0.7–4.0)
Monocytes Relative: 5.9 % (ref 3.0–12.0)
Platelets: 205 10*3/uL (ref 150.0–400.0)
RDW: 15 % — ABNORMAL HIGH (ref 11.5–14.6)

## 2011-02-06 LAB — BASIC METABOLIC PANEL
BUN: 39 mg/dL — ABNORMAL HIGH (ref 6–23)
Calcium: 9.3 mg/dL (ref 8.4–10.5)
Creatinine, Ser: 1.2 mg/dL (ref 0.4–1.2)
GFR: 46.8 mL/min — ABNORMAL LOW (ref >60.00)
Glucose, Bld: 265 mg/dL — ABNORMAL HIGH (ref 70–99)

## 2011-02-06 MED ORDER — TORSEMIDE 20 MG PO TABS: ORAL_TABLET | ORAL | Status: DC

## 2011-02-06 MED ORDER — CIPROFLOXACIN HCL 500 MG PO TABS: 500.0000 mg | ORAL_TABLET | Freq: Two times a day (BID) | ORAL | Status: DC

## 2011-02-06 NOTE — Assessment & Plan Note (Signed)
She has a prior h/o osteomyelitis in the other foot.  She has diabetes and it sounds like it is uncontrolled.  Her last A1C was 9.  She has been dealing with increased edema in the right leg for several weeks.  As noted, dopplers were negative for a DVT recently.  Given her hx., I am concerned she may have developed osteomyelitis in the right ankle.  She has multiple open wounds on her shin that would be an entry for infection.  She has not really responded as well as I would have expected to the Augmentin.  I have spoken to a radiologist who suggested MRI to evaluate for osteomyelitis.  We will arrange this ASAP. I also spoke with Dr. Ninetta Lights of ID over the phone.  I will keep her on augmentin and add Cipro 500 BID to her meds.  She will also need to eventually be set up for ABIs.  If her MRI is +, she will need referral to orthopedics for evaluation.  She knows to go to the ED if she feels worse.

## 2011-02-06 NOTE — Assessment & Plan Note (Signed)
She has multifactorial edema.  I suspect her infection is driving her edema currently.  I will increase her demadex to 40 mg in the AM and 20 mg in the PM.  Repeat BMET in several days and follow up with me again next week.

## 2011-02-06 NOTE — Progress Notes (Signed)
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 female with a history of morbid obesity complicated by obesity hypoventilation syndrome, hypertension, sleep apnea, diabetes, CRI and mild secondary pulmonary hypertension as well as hypokalemia. Carotid stenosis 40-59% bilaterally.   Initially, she had an echocardiogram read as severe LV dysfunction with an EF of 15-20%. She underwent catheterization in 2008 that showed minimal nonobstructive coronary artery disease with EF 55%. She had mild pulmonary hypertension with both pulmonary venous and pulmonary arterial components. Her PVR was 5.1 Wood units. Echo in 3/09 EF 60%.   Saw Dr. Antoine Poche in 8/11 over question of possible AF. ECG with PACs. 24 monitor NSR with occ PACs and PVCS. No AF.   She saw Dr. Gala Romney recently with increased edema.  He added metolazone.  She had an echo last month:  EF 55-60%, mild LAE.  Carotid dopplers:  40-59% bilateral - needs follow up in one year.  She saw Dr. Shelle Iron and venous dopplers were negative for DVT bilaterally.  Labs:  (12/29/10):  K 5, creat 1.0 Labs:  (01/06/11):  K 4.5, creat 1.1 Labs:  (02/02/11):  Na 134, K 4.1, creat 1.2  I saw her on Monday of this week.  Her edema was no better.  She also had some erythema and I was concerned she had developed cellulitis.  I switched her to demadex and also put her on Augmentin.  She returns for follow up.  Her edema is not better.  She notes some chills and fevers with temps of 99-100 degrees.  She notes pain in her leg.  She has diuresed more with the demadex.  Her weight is down.  She denies chest pain, dyspnea or syncope.  Of note, she follows chronically with a podiatrist. She had osteomyelitis in the past in her left great toe that required partial amputation of the toe.  She also had a partial achilles tendon rupture on the right a few months ago that resolved with immobilization.  She does have a h/o severe DVT  on the left in the past requiring evacuation.     Past Medical History  Diagnosis Date  . OSA (obstructive sleep apnea)   . Thrombophlebitis of deep vein of leg   . DM (diabetes mellitus)   . CHF (congestive heart failure)     hx of EF 15-20% in 4/08,  echo 3/09 EF 60-65%;   echo 6/12: EF 55-60%, mild LAE  . Carotid stenosis     u/s 6/10: R 0-39%Ll 40-59%;  u/s 7/12: 40-59% bilateral (repeat in 7/13)  . HTN (hypertension)   . Morbid obesity   . Pulmonary HTN     mild,  cath 6/08 with PVR 5.1  . Hypokalemia   . 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 Spinal stenosis   . Obesity hypoventilation syndrome     Current Outpatient Prescriptions  Medication Sig Dispense Refill  . amoxicillin-clavulanate (AUGMENTIN) 875-125 MG per tablet Take one twice a day for 10 days  20 tablet  0  . APIDRA 100 UNIT/ML injection       . aspirin (BAYER ASPIRIN) 325 MG tablet Take 325 mg by mouth daily.        . carvedilol (COREG) 3.125 MG tablet 1 by mouth qam only       . cyanocobalamin (,VITAMIN B-12,) 1000 MCG/ML injection Inject 1,000 mcg into the muscle every 30 (thirty)  days.       . Ferrous Sulfate 83 MG TABS Take 1 tablet by mouth 2 (two) times daily.        . hydrochlorothiazide 25 MG tablet Take 0.5 tablets (12.5 mg total) by mouth daily.  15 tablet  6  . insulin glargine (LANTUS) 100 UNIT/ML injection Use as directed 42 UNITS in the morning and 80 units at night      . LORazepam (ATIVAN) 1 MG tablet Take 1 mg by mouth 2 (two) times daily.        . metolazone (ZAROXOLYN) 2.5 MG tablet Take 1 tablet (2.5 mg total) by mouth daily. As needed  15 tablet  3  . naproxen (NAPROSYN) 375 MG tablet       . perindopril (ACEON) 8 MG tablet Take 8 mg by mouth daily.        . potassium chloride SA (K-DUR,KLOR-CON) 20 MEQ tablet Take one tablet every other day      . sertraline (ZOLOFT) 100 MG tablet Take 1/2 tab by mouth once daily       . simvastatin (ZOCOR) 40 MG tablet Take 40 mg  by mouth at bedtime.        Marland Kitchen spironolactone (ALDACTONE) 25 MG tablet Take 25 mg by mouth daily.        Marland Kitchen torsemide (DEMADEX) 20 MG tablet Take one tablet twice a day  60 tablet  6  . Vitamin D, Ergocalciferol, (DRISDOL) 50000 UNITS CAPS Take by mouth. Take 2 capsules weekly         Allergies: Allergies  Allergen Reactions  . Codeine   . Meperidine Hcl   . Metformin     Vital Signs: BP 122/66  Pulse 62  Ht 5\' 3"  (1.6 m)  Wt 283 lb 1.9 oz (128.422 kg)  BMI 50.15 kg/m2  PHYSICAL EXAM: Well nourished, well developed, in no acute distress HEENT: normal Neck: no JVD  Cardiac:  normal S1, S2; RRR; no murmur Lungs:  clear to auscultation bilaterally, no wheezing, rhonchi or rales Abd: soft, nontender, no hepatomegaly Ext: 1+ edema on left and 2+ on right, weeping noted on right Skin: 2-3 open wounds noted on lower right leg with surrounding erythema, ankle with erythema with + calor, slightly improved from last visit Neuro:  CNs 2-12 intact, no focal abnormalities noted   ASSESSMENT AND PLAN:

## 2011-02-06 NOTE — Patient Instructions (Addendum)
Your physician recommends that you schedule a follow-up appointment in: 02/16/11 @ 10 am to see Tereso Newcomer, PA-C   Your physician recommends that you return for lab work in: TODAY BMET, CBC W/DIFF; REPEAT BMET 02/09/11  Your physician has recommended you make the following change in your medication: START CIPRO 500 MG 1 TABLET TWICE DAILY; INCREASE DEMADEX 40 MG IN THE MORNING AND 20 MG IN THE EVENING; STAY ON THE CURRENT DOSE OF POTASSIUM AT THIS TIME.  YOU NEED TO HAVE AN MRI OF YOUR RIGHT ANKLE TODAY FOR CELLULITIS/OSTEOMYELITIS AS PER SCOTT WEAVER, PA-C.

## 2011-02-07 ENCOUNTER — Ambulatory Visit (HOSPITAL_COMMUNITY)
Admission: RE | Admit: 2011-02-07 | Discharge: 2011-02-07 | Disposition: A | Discharge status: Home / Self Care | Payer: Medicare Other | Source: Ambulatory Visit | Attending: Physician Assistant | Admitting: Physician Assistant

## 2011-02-07 DIAGNOSIS — X58XXXA Exposure to other specified factors, initial encounter: Secondary | ICD-10-CM | POA: Insufficient documentation

## 2011-02-07 DIAGNOSIS — L02419 Cutaneous abscess of limb, unspecified: Secondary | ICD-10-CM | POA: Insufficient documentation

## 2011-02-07 DIAGNOSIS — S93499A Sprain of other ligament of unspecified ankle, initial encounter: Secondary | ICD-10-CM | POA: Insufficient documentation

## 2011-02-07 DIAGNOSIS — M7989 Other specified soft tissue disorders: Secondary | ICD-10-CM | POA: Insufficient documentation

## 2011-02-07 DIAGNOSIS — M25579 Pain in unspecified ankle and joints of unspecified foot: Secondary | ICD-10-CM | POA: Insufficient documentation

## 2011-02-09 ENCOUNTER — Other Ambulatory Visit (INDEPENDENT_AMBULATORY_CARE_PROVIDER_SITE_OTHER): Payer: Medicare Other | Admitting: *Deleted

## 2011-02-09 DIAGNOSIS — R609 Edema, unspecified: Secondary | ICD-10-CM

## 2011-02-09 LAB — BASIC METABOLIC PANEL
BUN: 41 mg/dL — ABNORMAL HIGH (ref 6–23)
CO2: 30 mEq/L (ref 19–32)
Calcium: 9.2 mg/dL (ref 8.4–10.5)
Creatinine, Ser: 1.5 mg/dL — ABNORMAL HIGH (ref 0.4–1.2)
Glucose, Bld: 170 mg/dL — ABNORMAL HIGH (ref 70–99)

## 2011-02-10 ENCOUNTER — Telehealth: Payer: Self-pay | Admitting: *Deleted

## 2011-02-10 DIAGNOSIS — I509 Heart failure, unspecified: Secondary | ICD-10-CM

## 2011-02-10 NOTE — Telephone Encounter (Signed)
Pt aware of lab results and repeat bmet 02/16/11 and to decrease demadex. Danielle Rankin

## 2011-02-11 ENCOUNTER — Telehealth: Payer: Self-pay | Admitting: Internal Medicine

## 2011-02-11 MED ORDER — AMOXICILLIN-POT CLAVULANATE 875-125 MG PO TABS: 1.0000 | ORAL_TABLET | Freq: Two times a day (BID) | ORAL | Status: DC

## 2011-02-11 NOTE — Telephone Encounter (Signed)
I talked with pt. Pt states she is due to take her last dose of Augmentin tonight. She is concerned because she still has some redness on her foot, although it is improving. I discussed with Tereso Newcomer. Scott recommended 4 more days of Augmentin 875 mg bid so that she would be on it a total of 2 weeks. Pt is aware.

## 2011-02-11 NOTE — Telephone Encounter (Signed)
Pt saw Scott and he gave her Amoxicillin for foot infection.  She only has one left.  Still has redness and swelling.  Does she need refill or wait for appointment on Monday.  Please call her with this info.

## 2011-02-16 ENCOUNTER — Ambulatory Visit (INDEPENDENT_AMBULATORY_CARE_PROVIDER_SITE_OTHER): Payer: Medicare Other | Admitting: Physician Assistant

## 2011-02-16 ENCOUNTER — Encounter: Payer: Self-pay | Admitting: Physician Assistant

## 2011-02-16 ENCOUNTER — Other Ambulatory Visit (INDEPENDENT_AMBULATORY_CARE_PROVIDER_SITE_OTHER): Payer: Medicare Other | Admitting: *Deleted

## 2011-02-16 VITALS — BP 118/52 | HR 85 | Resp 18 | Ht 63.0 in | Wt 240.1 lb

## 2011-02-16 DIAGNOSIS — R609 Edema, unspecified: Secondary | ICD-10-CM

## 2011-02-16 DIAGNOSIS — L0291 Cutaneous abscess, unspecified: Secondary | ICD-10-CM

## 2011-02-16 DIAGNOSIS — I509 Heart failure, unspecified: Secondary | ICD-10-CM

## 2011-02-16 DIAGNOSIS — L0390 Cellulitis, unspecified: Secondary | ICD-10-CM

## 2011-02-16 LAB — BASIC METABOLIC PANEL
BUN: 46 mg/dL — ABNORMAL HIGH (ref 6–23)
Calcium: 9.6 mg/dL (ref 8.4–10.5)
Creatinine, Ser: 1.7 mg/dL — ABNORMAL HIGH (ref 0.4–1.2)
GFR: 32.13 mL/min — ABNORMAL LOW (ref >60.00)

## 2011-02-16 MED ORDER — SULFAMETHOXAZOLE-TRIMETHOPRIM 800-160 MG PO TABS: 2.0000 | ORAL_TABLET | Freq: Two times a day (BID) | ORAL | Status: AC

## 2011-02-16 NOTE — Assessment & Plan Note (Addendum)
Slow improvement.  Exam is not all that impressive.  However, she had evidence of diffuse cellulitis on MRI.  And, she is still having low grade temperatures. I am somewhat concerned she may have community acquired MRSA.  Therefore, d/c cipro. Start on Bactrim DS to cover for MRSA.  I spoke with Dr. Gala Romney who agreed and suggested I send her to ID.  I arranged an appt this week for her.  I also spoke to Dr. Luciana Axe.  I will place her on Bactrim 2 tabs bid and keep a close eye on renal fxn and K+.  She is due for a bmet today and will get another later this week.  I will defer length of treatment to ID.

## 2011-02-16 NOTE — Progress Notes (Signed)
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 female with a history of morbid obesity complicated by obesity hypoventilation syndrome, hypertension, sleep apnea, diabetes, CRI and mild secondary pulmonary hypertension as well as hypokalemia. Carotid stenosis 40-59% bilaterally.  Initially, she had an echocardiogram read as severe LV dysfunction with an EF of 15-20%. She underwent catheterization in 2008 that showed minimal nonobstructive coronary artery disease with EF 55%. She had mild pulmonary hypertension with both pulmonary venous and pulmonary arterial components. Her PVR was 5.1 Wood units. Echo in 3/09 EF 60%.  Saw Dr. Antoine Poche in 8/11 over question of possible AF. ECG with PACs. 24 monitor NSR with occ PACs and PVCS. No AF.    She saw Dr. Gala Romney recently with increased edema.  He added metolazone.  She had an echo last month:  EF 55-60%, mild LAE.  Carotid dopplers:  40-59% bilateral - needs follow up in one year.  She saw Dr. Shelle Iron and venous dopplers were negative for DVT bilaterally.  I saw her recently with probable right leg cellulitis.  I placed her on Augmentin and torsemide.  Her swelling did not improve and she continued to have low-grade fevers.  I was concerned she was developing osteomyelitis.  MRI was negative for osteomyelitis but did confirm cellulitis.  Cipro had been added to her medical regimen.  I had also increased her torsemide.  Her creatinine began to go up and I cut back on her torsemide.  When I last talked to the patient over the phone, her symptoms were improved.  Labs:  (12/29/10):  K 5, creat 1.0 Labs:  (01/06/11):  K 4.5, creat 1.1 Labs:  (02/02/11):  Na 134, K 4.1, creat 1.2 Labs:  (02/06/11):  Na 140, K 4.6, BUN 39, creat 1.2, WBC 11.6, Hgb 11.8 Labs:  (02/09/11):  Na 137, K 3.9, BUN 41, creat 1.5  She is still having low grade temperatures.  She also notes chills.  The redness and swelling in her right leg  is improved.  But, it is not completely resolved.  She denies chest pain, dyspnea or syncope.  Past Medical History  Diagnosis Date  . OSA (obstructive sleep apnea)   . Thrombophlebitis of deep vein of leg   . DM (diabetes mellitus)   . CHF (congestive heart failure)     hx of EF 15-20% in 4/08,  echo 3/09 EF 60-65%;   echo 6/12: EF 55-60%, mild LAE  . Carotid stenosis     u/s 6/10: R 0-39%Ll 40-59%;  u/s 7/12: 40-59% bilateral (repeat in 7/13)  . HTN (hypertension)   . Morbid obesity   . Pulmonary HTN     mild,  cath 6/08 with PVR 5.1  . Hypokalemia   . 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 Spinal stenosis   . Obesity hypoventilation syndrome     Current Outpatient Prescriptions  Medication Sig Dispense Refill  . APIDRA 100 UNIT/ML injection       . aspirin (BAYER ASPIRIN) 325 MG tablet Take 325 mg by mouth daily.        . carvedilol (COREG) 3.125 MG tablet 1 by mouth qam only       . ciprofloxacin (CIPRO) 500 MG tablet Take 1 tablet (500 mg total) by mouth 2 (two) times daily.  28 tablet  0  . cyanocobalamin (,VITAMIN B-12,) 1000 MCG/ML injection Inject 1,000 mcg into the  muscle every 30 (thirty) days.       . Ferrous Sulfate 83 MG TABS Take 1 tablet by mouth 2 (two) times daily.        . hydrochlorothiazide 25 MG tablet Take 0.5 tablets (12.5 mg total) by mouth daily.  15 tablet  6  . insulin glargine (LANTUS) 100 UNIT/ML injection Use as directed 42 UNITS in the morning and 80 units at night      . LORazepam (ATIVAN) 1 MG tablet Take 1 mg by mouth 2 (two) times daily.        . metolazone (ZAROXOLYN) 2.5 MG tablet Take 1 tablet (2.5 mg total) by mouth daily. As needed  15 tablet  3  . naproxen (NAPROSYN) 375 MG tablet       . perindopril (ACEON) 8 MG tablet Take 8 mg by mouth daily.        . potassium chloride SA (K-DUR,KLOR-CON) 20 MEQ tablet Take one tablet every other day      . sertraline (ZOLOFT) 100 MG tablet Take 1/2 tab by mouth once daily        . simvastatin (ZOCOR) 40 MG tablet Take 40 mg by mouth at bedtime.        Marland Kitchen spironolactone (ALDACTONE) 25 MG tablet Take 25 mg by mouth daily.        Marland Kitchen torsemide (DEMADEX) 20 MG tablet Take 1 TABLET IN THE MORNING AND 1 TABLET IN THE EVENING       . Vitamin D, Ergocalciferol, (DRISDOL) 50000 UNITS CAPS Take by mouth. Take 2 capsules weekly       . DISCONTD: torsemide (DEMADEX) 20 MG tablet Take 2 TABLETS IN THE MORNING AND 1 TABLET IN THE EVENING  60 tablet  6    Allergies: Allergies  Allergen Reactions  . Codeine   . Meperidine Hcl   . Metformin     Vital Signs: BP 118/52  Pulse 85  Resp 18  Ht 5\' 3"  (1.6 m)  Wt 240 lb 1.9 oz (108.918 kg)  BMI 42.54 kg/m2  PHYSICAL EXAM: Well nourished, well developed, in no acute distress HEENT: normal Neck: no JVD at 90 degrees  Cardiac:  normal S1, S2; RRR; no murmur Lungs:  clear to auscultation bilaterally, no wheezing, rhonchi or rales Abd: soft, nontender Ext: trace to 1+ edema on left and 2+ on right, with some weeping noted on right Skin: 2-3 open wounds noted on lower right leg with trace surrounding erythema, ankle with erythema mostly resolved with skin desquamation; 2nd toe on right now red Neuro:  CNs 2-12 intact, no focal abnormalities noted   ASSESSMENT AND PLAN:

## 2011-02-16 NOTE — Assessment & Plan Note (Addendum)
Right still > left.  Still suspect infection playing a role.  Creatinine went up with increased dose of torsemide.  Continue to keep legs elevated and continue same dose of torsemide.  Repeat BMET today.  As noted, check again later this week.  Follow up with Dr. Gala Romney in 1-2 months.

## 2011-02-16 NOTE — Patient Instructions (Addendum)
You have an appointment with Dr. Staci Righter of Infectious Disease on: Wednesday February 18, 2011 at 11:15am.  Please arrive 15 minutes early. Infectious Disease Clinic is located: [facility] across from [facility]. [address] [place] Phone number:  [phone]  Your physician recommends that you return for lab work in: TODAY BMET 782.3;         REPEAT BMET 02/19/11  Your physician has recommended you make the following change in your medication: STOP CIPRO; START BACTRIM DS 2 TABS TWICE DAILY X 7 DAYS THEN STOP  Your physician recommends that you schedule a follow-up appointment in: 03/26/11 @ 2:00 TO SEE DR. Gala Romney AS PER SCOTT WEAVER, PA-C AND HEATHER SCHUB, RN.

## 2011-02-17 ENCOUNTER — Telehealth: Payer: Self-pay | Admitting: *Deleted

## 2011-02-17 DIAGNOSIS — I509 Heart failure, unspecified: Secondary | ICD-10-CM

## 2011-02-17 NOTE — Telephone Encounter (Signed)
Pt aware of lab results and med changes at this time. Repeat bmet 02/19/11 as per SW... Danielle Rankin

## 2011-02-18 ENCOUNTER — Telehealth: Payer: Self-pay | Admitting: *Deleted

## 2011-02-18 ENCOUNTER — Encounter: Payer: Self-pay | Admitting: Internal Medicine

## 2011-02-18 ENCOUNTER — Ambulatory Visit (INDEPENDENT_AMBULATORY_CARE_PROVIDER_SITE_OTHER): Payer: Medicare Other | Admitting: Internal Medicine

## 2011-02-18 ENCOUNTER — Other Ambulatory Visit (INDEPENDENT_AMBULATORY_CARE_PROVIDER_SITE_OTHER): Payer: Medicare Other | Admitting: *Deleted

## 2011-02-18 VITALS — BP 177/65 | HR 99 | Temp 98.1°F | Ht 63.0 in | Wt 286.0 lb

## 2011-02-18 DIAGNOSIS — I509 Heart failure, unspecified: Secondary | ICD-10-CM

## 2011-02-18 DIAGNOSIS — L0390 Cellulitis, unspecified: Secondary | ICD-10-CM

## 2011-02-18 LAB — BASIC METABOLIC PANEL
BUN: 41 mg/dL — ABNORMAL HIGH (ref 6–23)
Calcium: 9.2 mg/dL (ref 8.4–10.5)
GFR: 26.92 mL/min — ABNORMAL LOW (ref >60.00)
Glucose, Bld: 256 mg/dL — ABNORMAL HIGH (ref 70–99)
Sodium: 138 mEq/L (ref 135–145)

## 2011-02-18 MED ORDER — DOXYCYCLINE HYCLATE 100 MG PO TABS: 100.0000 mg | ORAL_TABLET | Freq: Two times a day (BID) | ORAL | Status: AC

## 2011-02-18 NOTE — Assessment & Plan Note (Signed)
DDx is likely staph or strep infection.  She has been covered for strep and had no improvement.  I am concerned for CA-MRSA.  She was started on Bactrim for this but due to increase in creat this has been stopped.  I will start her on doxycyline today and follow up in 2 weeks.  She was told to call if it worsens, she develops significant fever or other concerns.   She also does report to me that she had a negative lower ext doppler.

## 2011-02-18 NOTE — Progress Notes (Signed)
  Subjective:    Patient ID: [name], female    DOB: 04-01-1946, 65 y.o.   MRN: [id number]  HPI 65 yo female with multiple medical issues who was seen by her cardiologist and noted to have erythematous, edematous legs.  She reports that she had a low grade fever and that this started approx 2-3 weeks prior to presentation.  She otherwise denies chills, has had some mild headache.  She was initially started on Augmentin by cardiology but failed to have significant improvement.  She was then seen 2 days prior to this presentation and started on Bactrim 2 DS BID.  However this was not continued due to and increase in her creat.  She states that her leg has been painful, though still able to walk on it.  She is here for further antibiotic recs.  She had an MRI evaluation which was negative for abscess or osteomyelitis.      Review of Systems  All other systems reviewed and are negative.       Objective:   Physical Exam  Constitutional: She is oriented to person, place, and time. She appears well-developed and well-nourished.       Morbidly obese  Cardiovascular: Regular rhythm and normal heart sounds.   No murmur heard. Pulmonary/Chest: Effort normal and breath sounds normal. No respiratory distress.  Abdominal: Soft. Bowel sounds are normal. She exhibits no distension.  Musculoskeletal:       Right leg with notable erythema just past ankle, multiple lesions that have been drained.  Edematous.  DP pulses faint but good cap refill.  No calf tenderness.   Neurological: She is alert and oriented to person, place, and time.          Assessment & Plan:

## 2011-02-18 NOTE — Telephone Encounter (Signed)
x4 busy signal. [name]

## 2011-02-19 ENCOUNTER — Other Ambulatory Visit: Payer: Self-pay | Admitting: *Deleted

## 2011-02-19 ENCOUNTER — Other Ambulatory Visit: Payer: Medicare Other | Admitting: *Deleted

## 2011-02-19 DIAGNOSIS — Z79899 Other long term (current) drug therapy: Secondary | ICD-10-CM

## 2011-02-19 DIAGNOSIS — N289 Disorder of kidney and ureter, unspecified: Secondary | ICD-10-CM

## 2011-02-23 ENCOUNTER — Other Ambulatory Visit (INDEPENDENT_AMBULATORY_CARE_PROVIDER_SITE_OTHER): Payer: Medicare Other | Admitting: *Deleted

## 2011-02-23 DIAGNOSIS — Z79899 Other long term (current) drug therapy: Secondary | ICD-10-CM

## 2011-02-23 DIAGNOSIS — N289 Disorder of kidney and ureter, unspecified: Secondary | ICD-10-CM

## 2011-02-23 LAB — BASIC METABOLIC PANEL
Calcium: 9.1 mg/dL (ref 8.4–10.5)
Creatinine, Ser: 1.3 mg/dL — ABNORMAL HIGH (ref 0.4–1.2)

## 2011-02-24 ENCOUNTER — Other Ambulatory Visit: Payer: Self-pay | Admitting: *Deleted

## 2011-02-24 DIAGNOSIS — Z79899 Other long term (current) drug therapy: Secondary | ICD-10-CM

## 2011-02-24 DIAGNOSIS — N289 Disorder of kidney and ureter, unspecified: Secondary | ICD-10-CM

## 2011-02-26 ENCOUNTER — Other Ambulatory Visit (INDEPENDENT_AMBULATORY_CARE_PROVIDER_SITE_OTHER): Payer: Medicare Other | Admitting: *Deleted

## 2011-02-26 DIAGNOSIS — Z79899 Other long term (current) drug therapy: Secondary | ICD-10-CM

## 2011-02-26 DIAGNOSIS — N289 Disorder of kidney and ureter, unspecified: Secondary | ICD-10-CM

## 2011-02-26 LAB — BASIC METABOLIC PANEL
GFR: 55.03 mL/min — ABNORMAL LOW (ref >60.00)
Potassium: 4.4 mEq/L (ref 3.5–5.1)
Sodium: 141 mEq/L (ref 135–145)

## 2011-02-27 ENCOUNTER — Telehealth: Payer: Self-pay | Admitting: *Deleted

## 2011-02-27 DIAGNOSIS — I509 Heart failure, unspecified: Secondary | ICD-10-CM

## 2011-02-27 NOTE — Telephone Encounter (Signed)
Pt aware of lab results today and med changes and repeat bmet 03/06/11. Danielle Rankin

## 2011-03-03 ENCOUNTER — Ambulatory Visit (INDEPENDENT_AMBULATORY_CARE_PROVIDER_SITE_OTHER): Payer: Medicare Other | Admitting: Adult Health

## 2011-03-03 ENCOUNTER — Encounter: Payer: Self-pay | Admitting: Adult Health

## 2011-03-03 ENCOUNTER — Telehealth: Payer: Self-pay | Admitting: Adult Health

## 2011-03-03 DIAGNOSIS — R05 Cough: Secondary | ICD-10-CM | POA: Insufficient documentation

## 2011-03-03 DIAGNOSIS — J209 Acute bronchitis, unspecified: Secondary | ICD-10-CM

## 2011-03-03 DIAGNOSIS — R059 Cough, unspecified: Secondary | ICD-10-CM | POA: Insufficient documentation

## 2011-03-03 MED ORDER — AMOXICILLIN-POT CLAVULANATE 875-125 MG PO TABS: 1.0000 | ORAL_TABLET | Freq: Two times a day (BID) | ORAL | Status: AC

## 2011-03-03 NOTE — Progress Notes (Signed)
  Subjective:    Patient ID: [name], female    DOB: Feb 27, 1946, 65 y.o.   MRN: [id number]  HPI 65 yo WF with known hx of OHS/OSA on nocturnal BIPAP   03/03/2011 Acute OV  Pt presents for work in visit. Complains of 1 week of cough, congestion, green mucus . Has had increased dyspnea for last week. No otc used. Currently being followed by ID for chronic venous insufficiency /cellulitis on Doxycycline x 6 weeks.  Also seen recently by Cardiology for CHF follow up -diurectics adjusted w/ discontinuation of aldactone, zaroxlyn and HCTZ  due to renal insufficiency. She is now on torsemide 20mg  daily. Scr has decreased from 2.0 >1.1.      Review of Systems Constitutional:   No  weight loss, night sweats,  Fevers, chills,  +fatigue, or  lassitude.  HEENT:   No headaches,  Difficulty swallowing,  Tooth/dental problems, or  Sore throat,                No sneezing, itching, ear ache, nasal congestion, post nasal drip,   CV:  No chest pain,  Orthopnea, PND, swelling in lower extremities, anasarca, dizziness, palpitations, syncope.   GI  No heartburn, indigestion, abdominal pain, nausea, vomiting, diarrhea, change in bowel habits, loss of appetite, bloody stools.   Resp:No coughing up of blood.   No chest wall deformity  Skin: no rash or lesions.  GU: no dysuria, change in color of urine, no urgency or frequency.  No flank pain, no hematuria   MS:  No joint pain or swelling.  No decreased range of motion.   Psych:  No change in mood or affect. No depression or anxiety.           Objective:   Physical Exam GEN: A/Ox3; pleasant , NAD, morbidly obese   HEENT:  [place]/AT,  EACs-clear, TMs-wnl, NOSE-clear, THROAT-clear, no lesions, no postnasal drip or exudate noted.   NECK:  Supple w/ fair ROM; no JVD; normal carotid impulses w/o bruits; no thyromegaly or nodules palpated; no lymphadenopathy.  RESP  Clear  P & A; w/o, wheezes/ rales/ or rhonchi.no accessory muscle use, no dullness to  percussion  CARD:  RRR, no m/r/g  , 2+ peripheral edema, pulses intact, no cyanosis or clubbing., stasis dermatatic changes   GI:   Soft & nt; nml bowel sounds; no organomegaly or masses detected.  Musco: Warm bil, no deformities or joint swelling noted.   Neuro: alert, no focal deficits noted.    Skin: Warm, no lesions or rashes         Assessment & Plan:

## 2011-03-03 NOTE — Assessment & Plan Note (Signed)
Acute bronchitis with underlying OHS/OSA w/ hx of CHF/fluid overload  Plan: Augmentin 875mg  Twice daily  For 7 days -take with food Extra Torsemide today x 1 .  Mucinex DM Twice daily  As needed  Cough/congestion  follow up 1 week Dr. Shelle Iron and As needed   Please contact office for sooner follow up if symptoms do not improve or worsen or seek emergency care  Wear BIPAP with naps and At bedtime

## 2011-03-03 NOTE — Telephone Encounter (Signed)
Pt needs to follow up with [facility] rather than [facility].  Meg, can you advise on an appt for pt?  Thanks!

## 2011-03-03 NOTE — Patient Instructions (Addendum)
Augmentin 875mg  Twice daily  For 7 days -take with food Extra Torsemide today x 1 .  Mucinex DM Twice daily  As needed  Cough/congestion  follow up 1 week Dr. Shelle Iron and As needed   Please contact office for sooner follow up if symptoms do not improve or worsen or seek emergency care  Wear BIPAP with naps and At bedtime

## 2011-03-04 NOTE — Telephone Encounter (Signed)
Called and spoke with pt.  Pt scheduled to see [facility] 03/10/11 at 4:15pm.

## 2011-03-04 NOTE — Progress Notes (Signed)
Note and plans reviewed. Agree with plan as outlined.

## 2011-03-05 ENCOUNTER — Encounter: Payer: Self-pay | Admitting: Physician Assistant

## 2011-03-06 ENCOUNTER — Ambulatory Visit (INDEPENDENT_AMBULATORY_CARE_PROVIDER_SITE_OTHER): Payer: Medicare Other | Admitting: Internal Medicine

## 2011-03-06 ENCOUNTER — Other Ambulatory Visit (INDEPENDENT_AMBULATORY_CARE_PROVIDER_SITE_OTHER): Payer: Medicare Other | Admitting: *Deleted

## 2011-03-06 ENCOUNTER — Encounter: Payer: Self-pay | Admitting: Internal Medicine

## 2011-03-06 DIAGNOSIS — L0390 Cellulitis, unspecified: Secondary | ICD-10-CM

## 2011-03-06 DIAGNOSIS — I509 Heart failure, unspecified: Secondary | ICD-10-CM

## 2011-03-06 LAB — BASIC METABOLIC PANEL
BUN: 30 mg/dL — ABNORMAL HIGH (ref 6–23)
Calcium: 8.8 mg/dL (ref 8.4–10.5)
GFR: 47.23 mL/min — ABNORMAL LOW (ref >60.00)
Glucose, Bld: 93 mg/dL (ref 70–99)
Sodium: 145 mEq/L (ref 135–145)

## 2011-03-06 NOTE — Progress Notes (Signed)
  Subjective:    Patient ID: [name], female    DOB: 12-02-1945, 65 y.o.   MRN: [id number]  HPI here for follow up.  Diagnosed with cellulitis by cardiology and started initially on Augmentin with no improvement and switched to Bactrim, but had renal insufficiency and then to Doxycyline for presumed cellulitis.  Her biggest complaint has been the edema of her leg which have been notable for swelling and inability to put on her shoes.  Also has a 20 lb weight gain likely fluid.  No fever, no chills. Tolerated the doxy well.  Recently started on Augmentin for bronchitis.  She has noted some blistering over her legs that are non tender and clear fluid filled.     She does have a history of second hand smoke exposure and cardiac disease.      Review of Systems  Constitutional: Positive for activity change.       Difficulty walking  HENT: Negative.   Respiratory: Positive for cough and wheezing.        Has bronchitis  Cardiovascular: Negative.   Gastrointestinal: Negative.   Genitourinary: Negative.   Musculoskeletal:       Leg swelling, weeping  Skin: Negative.   Neurological: Negative.   Hematological: Negative.   Psychiatric/Behavioral: Negative.        Objective:   Physical Exam  Constitutional: She is oriented to person, place, and time.       Morbidly obese  Cardiovascular: Normal rate, regular rhythm and normal heart sounds.   No murmur heard. Pulmonary/Chest: Effort normal and breath sounds normal. No respiratory distress.  Abdominal: Soft. Bowel sounds are normal. She exhibits no distension.       obese  Musculoskeletal:       Bilateral legs with 2+ pitting edema, multiple non-infectious lesions, weeping skin with clear fluid   Lymphadenopathy:    She has no cervical adenopathy.  Neurological: She is alert and oriented to person, place, and time.          Assessment & Plan:

## 2011-03-06 NOTE — Assessment & Plan Note (Signed)
At this time, there is no further active infection.  Her biggest problem is likely venous insufficiency now.  She has significant pitting edema but no pulmonary edema by exam.   I suspect most of her problem is that.  I have instructed her to stop her antibiotics for the cellulitis.  I have suggested to discuss with her cardiologist or primary physician further interventions that might be needed.

## 2011-03-09 ENCOUNTER — Telehealth: Payer: Self-pay | Admitting: Internal Medicine

## 2011-03-09 NOTE — Telephone Encounter (Signed)
Spoke w/pt she has been on antibiotics for weeks and she states she saw infectious disease on Fri adn they told her the cellulitis has healed and the remaining swelling is probably a vascular issue adn advised her to f/u with [place].  She states her lower extremities swell bad during the day and even develop sores which open up and drain, at night the swelling goes down.  Will discuss with Dr Gala Romney and review infectious disease note

## 2011-03-09 NOTE — Telephone Encounter (Signed)
Pt wants to talk about her feet and legs because it is not a Iinfec. Dx problem it's a pv issue

## 2011-03-09 NOTE — Telephone Encounter (Signed)
Per DB needs appt, sch for Mon 8/27 at heart failure clinic

## 2011-03-10 ENCOUNTER — Encounter: Payer: Self-pay | Admitting: Pulmonary Disease

## 2011-03-10 ENCOUNTER — Ambulatory Visit (INDEPENDENT_AMBULATORY_CARE_PROVIDER_SITE_OTHER): Payer: Medicare Other | Admitting: Pulmonary Disease

## 2011-03-10 ENCOUNTER — Ambulatory Visit: Payer: Medicare Other | Admitting: Adult Health

## 2011-03-10 VITALS — BP 128/60 | HR 91 | Temp 98.2°F | Ht 63.0 in | Wt 304.6 lb

## 2011-03-10 DIAGNOSIS — E678 Other specified hyperalimentation: Secondary | ICD-10-CM

## 2011-03-10 DIAGNOSIS — R05 Cough: Secondary | ICD-10-CM

## 2011-03-10 MED ORDER — BENZONATATE 100 MG PO CAPS: 200.0000 mg | ORAL_CAPSULE | Freq: Four times a day (QID) | ORAL | Status: AC

## 2011-03-10 MED ORDER — HYDROCODONE-HOMATROPINE 5-1.5 MG/5ML PO SYRP: 5.0000 mL | ORAL_SOLUTION | Freq: Every evening | ORAL | Status: AC | PRN

## 2011-03-10 NOTE — Progress Notes (Signed)
  Subjective:    Patient ID: [name], female    DOB: Jul 06, 1946, 65 y.o.   MRN: [id number]  HPI The patient comes in today for an acute sick visit for cough.  She was recently seen by her nurse practitioner for possible bronchitis, and was treated with a short course of antibiotics at that time.  Despite this she is continued to have a barking cough with very little mucus production.  She does not feel overly congested in her chest.  She has mild postnasal drip but has not been having any purulence from her nose.  She denies any reflux symptoms, but certainly is at significant risk for this.  She has known congestive heart failure, and fluid retention has been a big issue for her.  Her weight is up 10 pounds from her last visit with me the middle of July.  She is scheduled to see cardiology in one week.   Review of Systems  Constitutional: Positive for fever, chills, appetite change and unexpected weight change.  HENT: Positive for congestion and sneezing. Negative for ear pain, nosebleeds, sore throat, rhinorrhea, trouble swallowing, dental problem, postnasal drip and sinus pressure.   Eyes: Positive for itching. Negative for redness.  Respiratory: Positive for cough, shortness of breath and wheezing. Negative for chest tightness.   Cardiovascular: Positive for leg swelling. Negative for palpitations.  Gastrointestinal: Negative for nausea and vomiting.  Genitourinary: Negative for dysuria.  Musculoskeletal: Negative for joint swelling.  Skin: Negative for rash.  Neurological: Positive for headaches.  Hematological: Bruises/bleeds easily.  Psychiatric/Behavioral: Negative for dysphoric mood. The patient is not nervous/anxious.        Objective:   Physical Exam Morbidly obese female in no acute distress The skin breakdown or pressure necrosis from the CPAP mask Chest with decreased depth of inspiration due to morbid obesity, a few basal crackles, no wheezes or rhonchi Cardiac exam  with regular rate and rhythm Lower extremities with 3+ edema, no cyanosis noted Alert, does not appear sleepy, moves all 4 extremities.       Assessment & Plan:

## 2011-03-10 NOTE — Assessment & Plan Note (Signed)
The patient's cough is barking in nature, and clearly upper airway in origin.  She is at significant risk for laryngopharyngeal reflux, and also is currently taking an ACE inhibitor.  At this point, I would like to substitute an ARB for her ACE, and will also treat her more aggressively for reflux.  I have gone over the various behavioral therapies which will also help upper airway cough.  I really do not think she has an infection at this point, but would consider imaging her sinuses if cough persist.

## 2011-03-10 NOTE — Patient Instructions (Addendum)
Stop aceon.  In its place take benicar 20mg  once a day.  Please discuss this med change with cardiology at your upcoming apptm. Take dexilant 60mg  one each am for possible reflux. Use hard candy during the day to bathe the back of your throat and limit cough.  No mint or menthol. No throat clearing. Use tessalon pearls during the day as directed for cough, but can use hydromet just at night if needed to prevent cough during sleep Keep your upcoming apptm with cardiology to work on fluid removal followup with me in 4mos.

## 2011-03-11 ENCOUNTER — Inpatient Hospital Stay (HOSPITAL_COMMUNITY): Payer: Medicare Other

## 2011-03-11 ENCOUNTER — Inpatient Hospital Stay (HOSPITAL_COMMUNITY)
Admission: RE | Admit: 2011-03-11 | Discharge: 2011-03-14 | DRG: 291 | Disposition: A | Discharge status: Home / Self Care | Payer: Medicare Other | Source: Ambulatory Visit | Attending: Internal Medicine | Admitting: Internal Medicine

## 2011-03-11 ENCOUNTER — Encounter (HOSPITAL_COMMUNITY): Payer: Self-pay | Admitting: Internal Medicine

## 2011-03-11 ENCOUNTER — Telehealth (HOSPITAL_COMMUNITY): Payer: Self-pay | Admitting: *Deleted

## 2011-03-11 DIAGNOSIS — I2789 Other specified pulmonary heart diseases: Secondary | ICD-10-CM | POA: Diagnosis present

## 2011-03-11 DIAGNOSIS — Z23 Encounter for immunization: Secondary | ICD-10-CM

## 2011-03-11 DIAGNOSIS — E119 Type 2 diabetes mellitus without complications: Secondary | ICD-10-CM | POA: Diagnosis present

## 2011-03-11 DIAGNOSIS — N189 Chronic kidney disease, unspecified: Secondary | ICD-10-CM | POA: Diagnosis present

## 2011-03-11 DIAGNOSIS — I129 Hypertensive chronic kidney disease with stage 1 through stage 4 chronic kidney disease, or unspecified chronic kidney disease: Secondary | ICD-10-CM | POA: Diagnosis present

## 2011-03-11 DIAGNOSIS — E876 Hypokalemia: Secondary | ICD-10-CM | POA: Diagnosis not present

## 2011-03-11 DIAGNOSIS — I509 Heart failure, unspecified: Secondary | ICD-10-CM | POA: Diagnosis present

## 2011-03-11 DIAGNOSIS — J9620 Acute and chronic respiratory failure, unspecified whether with hypoxia or hypercapnia: Secondary | ICD-10-CM | POA: Diagnosis present

## 2011-03-11 DIAGNOSIS — D649 Anemia, unspecified: Secondary | ICD-10-CM | POA: Diagnosis present

## 2011-03-11 DIAGNOSIS — I5033 Acute on chronic diastolic (congestive) heart failure: Secondary | ICD-10-CM

## 2011-03-11 DIAGNOSIS — N179 Acute kidney failure, unspecified: Secondary | ICD-10-CM | POA: Diagnosis not present

## 2011-03-11 DIAGNOSIS — Z794 Long term (current) use of insulin: Secondary | ICD-10-CM

## 2011-03-11 DIAGNOSIS — Z7982 Long term (current) use of aspirin: Secondary | ICD-10-CM

## 2011-03-11 DIAGNOSIS — Z79899 Other long term (current) drug therapy: Secondary | ICD-10-CM

## 2011-03-11 LAB — PRO B NATRIURETIC PEPTIDE: Pro B Natriuretic peptide (BNP): 1426 pg/mL — ABNORMAL HIGH (ref 0–125)

## 2011-03-11 LAB — DIFFERENTIAL
Basophils Absolute: 0 10*3/uL (ref 0.0–0.1)
Eosinophils Absolute: 0.6 10*3/uL (ref 0.0–0.7)
Eosinophils Relative: 4 % (ref 0–5)
Lymphocytes Relative: 19 % (ref 12–46)
Neutrophils Relative %: 70 % (ref 43–77)

## 2011-03-11 LAB — CBC
Platelets: 207 10*3/uL (ref 150–400)
RBC: 3.51 MIL/uL — ABNORMAL LOW (ref 3.87–5.11)
RDW: 14.2 % (ref 11.5–15.5)
WBC: 14.5 10*3/uL — ABNORMAL HIGH (ref 4.0–10.5)

## 2011-03-11 LAB — PROTIME-INR: Prothrombin Time: 14.3 seconds (ref 11.6–15.2)

## 2011-03-11 LAB — COMPREHENSIVE METABOLIC PANEL
ALT: 18 U/L (ref 0–35)
AST: 21 U/L (ref 0–37)
CO2: 34 mEq/L — ABNORMAL HIGH (ref 19–32)
Calcium: 9.7 mg/dL (ref 8.4–10.5)
Chloride: 102 mEq/L (ref 96–112)
Creatinine, Ser: 0.85 mg/dL (ref 0.50–1.10)
GFR calc Af Amer: >60 mL/min (ref >60)
GFR calc non Af Amer: >60 mL/min (ref >60)
Glucose, Bld: 84 mg/dL (ref 70–99)
Total Bilirubin: 0.2 mg/dL — ABNORMAL LOW (ref 0.3–1.2)

## 2011-03-11 LAB — MRSA PCR SCREENING: MRSA by PCR: NEGATIVE

## 2011-03-11 LAB — HEPARIN LEVEL (UNFRACTIONATED): Heparin Unfractionated: <0.1 IU/mL — ABNORMAL LOW (ref 0.30–0.70)

## 2011-03-11 NOTE — Progress Notes (Signed)
History of Present Illness: Primary Cardiologist:  Dr. [name] is a 65 y.o. female with a history of morbid obesity complicated by obesity hypoventilation syndrome, hypertension, sleep apnea, diabetes, CRI and mild secondary pulmonary hypertension as well as hypokalemia. Carotid stenosis 40-59% bilaterally.  Initially, she had an echocardiogram read as severe LV dysfunction with an EF of 15-20%. She underwent catheterization in 2008 that showed minimal nonobstructive coronary artery disease with EF 55%. She had mild pulmonary hypertension with both pulmonary venous and pulmonary arterial components. Her PVR was 5.1 Wood units. Echo in 3/09 EF 60%.  Saw Dr. Antoine Poche in 8/11 over question of possible AF. ECG with PACs. 24 monitor NSR with occ PACs and PVCS. No AF.    She saw Dr. Gala Romney in 6/12 with increased edema.  He added metolazone.  Repeat echo 01/30/11, EF 55-60%, mild LAE.  Carotid dopplers:  40-59% bilateral - needs follow up in one year.  She saw Dr. Shelle Iron and venous dopplers were negative for DVT bilaterally.  Has had multiple I saw her recently with probable right leg cellulitis.  I placed her on Augmentin and torsemide.  Her swelling did not improve and she continued to have low-grade fevers.  I was concerned she was developing osteomyelitis.  MRI was negative for osteomyelitis but did confirm cellulitis.  Cipro had been added to her medical regimen.  I had also increased her torsemide.  Her creatinine began to go up and I cut back on her torsemide.  When I last talked to the patient over the phone, her symptoms were improved.  Her redness and swelling in the right leg had improved but not completely resolved.  Dr. Luciana Axe with infectious disease evaluated her on 8/1 and changed her abx to doxycycline for 2 weeks, she completed this course.  Lab work on 8/17 showed improvement on Cr/BUN, as below.    Labs:  (12/29/10):  K 5, creat 1.0 Labs:  (01/06/11):  K 4.5, creat  1.1 Labs:  (02/02/11):  Na 134, K 4.1, creat 1.2 Labs:  (02/06/11):  Na 140, K 4.6, BUN 39, creat 1.2, WBC 11.6, Hgb 11.8 Labs:  (02/09/11):  Na 137, K 3.9, BUN 41, creat 1.5 Labs:  (03/06/11):  Na 145, K 3.4, BUN 30, Cr 1.2  Over the last week she has been complaining of increased nonproductive cough, weight gain, dyspnea with minimal exertion.  Denies CP.  Was evaluated by Dr. Shelle Iron yesterday for chronic cough.  He felt with the barking nature of the cough it was upper airway in origin.  He also discontinued her ACE inhibitor and placed her on Benicar 20mg  daily.  He also felt her volume status was up and asked her to follow up with cardiology.  She returns for follow up today.  Her weight is up ~15 pounds per our records but pt feels that she is ~30 pounds from her baseline weight.  She continues with the above symptoms but denies orthopnea and PND, uses BiPAP at night.  She is unable to walk from the kitchen table to the counter without dyspnea and lower extremity pain.  Her cellulitis has improved but she complains of increased lower extremity edema.     Past Medical History  Diagnosis Date  . OSA (obstructive sleep apnea)   . Thrombophlebitis of deep vein of leg   . DM (diabetes mellitus)   . CHF (congestive heart failure)     hx of EF 15-20% in 4/08,  echo 3/09  EF 60-65%;   echo 6/12: EF 55-60%, mild LAE  . Carotid stenosis     u/s 6/10: R 0-39%Ll 40-59%;  u/s 7/12: 40-59% bilateral (repeat in 7/13)  . HTN (hypertension)   . Morbid obesity   . Pulmonary HTN     mild,  cath 6/08 with PVR 5.1  . Hypokalemia   . 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 Spinal stenosis   . Obesity hypoventilation syndrome     Current Outpatient Prescriptions  Medication Sig Dispense Refill  . amoxicillin-clavulanate (AUGMENTIN) 875-125 MG per tablet Take 1 tablet by mouth 2 (two) times daily.  14 tablet  0  . APIDRA 100 UNIT/ML injection       . aspirin (BAYER ASPIRIN) 325 MG  tablet Take 325 mg by mouth daily.        . benzonatate (TESSALON PERLES) 100 MG capsule Take 2 capsules (200 mg total) by mouth every 6 (six) hours.  30 capsule  0  . carvedilol (COREG) 3.125 MG tablet 1 by mouth qam only       . cyanocobalamin (,VITAMIN B-12,) 1000 MCG/ML injection Inject 1,000 mcg into the muscle every 30 (thirty) days.       . Ferrous Sulfate 83 MG TABS Take 1 tablet by mouth 2 (two) times daily.        . hydrochlorothiazide 25 MG tablet Take 12.5 mg by mouth daily. -hold       . HYDROcodone-homatropine (HYDROMET) 5-1.5 MG/5ML syrup Take 5 mLs by mouth at bedtime as needed for cough.  120 mL  0  . insulin glargine (LANTUS) 100 UNIT/ML injection Use as directed 42 UNITS in the morning and 80 units at night      . LORazepam (ATIVAN) 1 MG tablet Take 1 mg by mouth 2 (two) times daily.        . metolazone (ZAROXOLYN) 2.5 MG tablet Take 2.5 mg by mouth daily. As needed -hold      . naproxen (NAPROSYN) 375 MG tablet -discontinue      . perindopril (ACEON) 8 MG tablet Take 8 mg by mouth daily.        . potassium chloride SA (K-DUR,KLOR-CON) 20 MEQ tablet Take one tablet every other day -HOLD      . sertraline (ZOLOFT) 100 MG tablet Take 1/2 tab by mouth once daily       . simvastatin (ZOCOR) 40 MG tablet Take 40 mg by mouth at bedtime.        Marland Kitchen spironolactone (ALDACTONE) 25 MG tablet Take 25 mg by mouth daily.       Marland Kitchen torsemide (DEMADEX) 20 MG tablet Take 1 TABLET IN THE MORNING      . Vitamin D, Ergocalciferol, (DRISDOL) 50000 UNITS CAPS Take by mouth. Take 2 capsules weekly         Allergies: Allergies  Allergen Reactions  . Codeine   . Meperidine Hcl     Increase in heart rate  . Metformin     Vital Signs: BP 150/65  Pulse 93  Wt 300 lb (136.079 kg)  PHYSICAL EXAM: Well nourished, well developed, obese female, mildl dyspnic  HEENT: normal Neck: JVP difficult to access secondary to neck size but appears elevated  Cardiac:  normal S1, S2; RRR; no murmur Lungs:   Bibasilar crackles, moving air bilaterally Abd: soft, nontender Ext: bilateral lower extremity 3+ edema, healing wounds bilaterally with trace surrounding erythema, weeping wounds on Rt,  neg cyanosis or clubbing Neuro:  CNs 2-12 intact, no focal abnormalities noted   ASSESSMENT AND PLAN:

## 2011-03-11 NOTE — Progress Notes (Signed)
Patient admitted for diastolic HF flare.   See note created under Notes tab (encounter type = documentation for full details)

## 2011-03-11 NOTE — Assessment & Plan Note (Signed)
She has marked volume overload not responsive to increase in oral diuretics. Will admit for IV diuresis and possible ultrafiltration.

## 2011-03-11 NOTE — Telephone Encounter (Signed)
Pt states she saw Dr Shelle Iron yesterday and he stated her cough was from extra fluid, she states she has gained about 40 lbs over past several months and about 13 lbs in last week, she feels swollen all over, appt sch for today at 11 am with Dr Gala Romney

## 2011-03-12 ENCOUNTER — Inpatient Hospital Stay (HOSPITAL_COMMUNITY): Payer: Medicare Other

## 2011-03-12 LAB — CBC
HCT: 31.6 % — ABNORMAL LOW (ref 36.0–46.0)
Hemoglobin: 10 g/dL — ABNORMAL LOW (ref 12.0–15.0)
MCH: 29.2 pg (ref 26.0–34.0)
MCHC: 31.6 g/dL (ref 30.0–36.0)
RDW: 14.4 % (ref 11.5–15.5)

## 2011-03-12 LAB — BASIC METABOLIC PANEL
BUN: 19 mg/dL (ref 6–23)
CO2: 33 mEq/L — ABNORMAL HIGH (ref 19–32)
CO2: 32 mEq/L (ref 19–32)
Calcium: 9.4 mg/dL (ref 8.4–10.5)
Chloride: 100 mEq/L (ref 96–112)
Creatinine, Ser: 0.98 mg/dL (ref 0.50–1.10)
GFR calc non Af Amer: 57 mL/min — ABNORMAL LOW (ref >60)
Glucose, Bld: 107 mg/dL — ABNORMAL HIGH (ref 70–99)
Glucose, Bld: 139 mg/dL — ABNORMAL HIGH (ref 70–99)
Potassium: 3.6 mEq/L (ref 3.5–5.1)

## 2011-03-12 LAB — CARBOXYHEMOGLOBIN
Carboxyhemoglobin: 1.3 % (ref 0.5–1.5)
Methemoglobin: 1 % (ref 0.0–1.5)
O2 Saturation: 59.4 %
Total hemoglobin: 13.4 g/dL (ref 12.5–16.0)

## 2011-03-13 LAB — HEPARIN LEVEL (UNFRACTIONATED): Heparin Unfractionated: 0.57 IU/mL (ref 0.30–0.70)

## 2011-03-13 LAB — BASIC METABOLIC PANEL
BUN: 19 mg/dL (ref 6–23)
BUN: 25 mg/dL — ABNORMAL HIGH (ref 6–23)
CO2: 32 mEq/L (ref 19–32)
Calcium: 9.5 mg/dL (ref 8.4–10.5)
Calcium: 9.1 mg/dL (ref 8.4–10.5)
Chloride: 100 mEq/L (ref 96–112)
Creatinine, Ser: 0.99 mg/dL (ref 0.50–1.10)
Creatinine, Ser: 1.4 mg/dL — ABNORMAL HIGH (ref 0.50–1.10)
GFR calc Af Amer: >60 mL/min (ref >60)
GFR calc Af Amer: 39 mL/min — ABNORMAL LOW (ref >60)
GFR calc non Af Amer: 38 mL/min — ABNORMAL LOW (ref >60)
GFR calc non Af Amer: 32 mL/min — ABNORMAL LOW (ref >60)
Glucose, Bld: 177 mg/dL — ABNORMAL HIGH (ref 70–99)
Glucose, Bld: 281 mg/dL — ABNORMAL HIGH (ref 70–99)
Sodium: 135 mEq/L (ref 135–145)

## 2011-03-13 LAB — CBC
HCT: 30.4 % — ABNORMAL LOW (ref 36.0–46.0)
MCH: 29.6 pg (ref 26.0–34.0)
MCH: 29.5 pg (ref 26.0–34.0)
MCHC: 31.9 g/dL (ref 30.0–36.0)
MCHC: 32.1 g/dL (ref 30.0–36.0)
MCV: 92.7 fL (ref 78.0–100.0)
MCV: 91.8 fL (ref 78.0–100.0)
Platelets: 203 10*3/uL (ref 150–400)
RBC: 3.66 MIL/uL — ABNORMAL LOW (ref 3.87–5.11)
RDW: 14.2 % (ref 11.5–15.5)
WBC: 11.4 10*3/uL — ABNORMAL HIGH (ref 4.0–10.5)

## 2011-03-13 LAB — GLUCOSE, CAPILLARY: Glucose-Capillary: 240 mg/dL — ABNORMAL HIGH (ref 70–99)

## 2011-03-13 LAB — SODIUM, URINE, RANDOM: Sodium, Ur: 122 mEq/L

## 2011-03-14 LAB — CBC
HCT: 28.8 % — ABNORMAL LOW (ref 36.0–46.0)
MCH: 29.2 pg (ref 26.0–34.0)
MCHC: 31.9 g/dL (ref 30.0–36.0)
MCV: 91.4 fL (ref 78.0–100.0)
RDW: 14.3 % (ref 11.5–15.5)

## 2011-03-14 LAB — BASIC METABOLIC PANEL
BUN: 28 mg/dL — ABNORMAL HIGH (ref 6–23)
Calcium: 9.1 mg/dL (ref 8.4–10.5)
Creatinine, Ser: 1.57 mg/dL — ABNORMAL HIGH (ref 0.50–1.10)
GFR calc Af Amer: 40 mL/min — ABNORMAL LOW (ref >60)
GFR calc non Af Amer: 33 mL/min — ABNORMAL LOW (ref >60)

## 2011-03-14 LAB — PRO B NATRIURETIC PEPTIDE: Pro B Natriuretic peptide (BNP): 465.3 pg/mL — ABNORMAL HIGH (ref 0–125)

## 2011-03-16 ENCOUNTER — Ambulatory Visit (HOSPITAL_COMMUNITY): Payer: Medicare Other

## 2011-03-17 ENCOUNTER — Other Ambulatory Visit: Payer: Self-pay | Admitting: Pulmonary Disease

## 2011-03-17 ENCOUNTER — Ambulatory Visit (HOSPITAL_COMMUNITY)
Admission: RE | Admit: 2011-03-17 | Discharge: 2011-03-17 | Disposition: A | Discharge status: Home / Self Care | Payer: Medicare Other | Source: Ambulatory Visit | Attending: Internal Medicine | Admitting: Internal Medicine

## 2011-03-17 VITALS — BP 118/68 | HR 83 | Wt 281.0 lb

## 2011-03-17 DIAGNOSIS — I428 Other cardiomyopathies: Secondary | ICD-10-CM

## 2011-03-17 DIAGNOSIS — I5033 Acute on chronic diastolic (congestive) heart failure: Secondary | ICD-10-CM | POA: Insufficient documentation

## 2011-03-17 DIAGNOSIS — I5032 Chronic diastolic (congestive) heart failure: Secondary | ICD-10-CM

## 2011-03-17 DIAGNOSIS — I509 Heart failure, unspecified: Secondary | ICD-10-CM

## 2011-03-17 LAB — BASIC METABOLIC PANEL
BUN: 29 mg/dL — ABNORMAL HIGH (ref 6–23)
CO2: 32 mEq/L (ref 19–32)
Chloride: 98 mEq/L (ref 96–112)
Creatinine, Ser: 1.1 mg/dL (ref 0.50–1.10)
Glucose, Bld: 83 mg/dL (ref 70–99)

## 2011-03-17 NOTE — Discharge Summary (Signed)
[name], [name]               ACCOUNT NO.:  [id number]  MEDICAL RECORD NO.:  [id number]  LOCATION:  2915                         FACILITY:  [facility]  PHYSICIAN:  [name]. Sorina Derrig, MDDATE OF BIRTH:  May 26, 1946  DATE OF ADMISSION:  03/11/2011 DATE OF DISCHARGE:  03/14/2011                              DISCHARGE SUMMARY   PRIMARY CARE PHYSICIAN:  Caryn Bee L. Little, M.D.  DISCHARGE DIAGNOSES: 1. Acute on chronic diastolic heart failure. 2. Pulmonary hypertension. 3. Cardiorenal syndrome. 4. Acute renal insufficiency. 5. Morbid obesity. 6. Obstructive sleep apnea. 7. Anemia.  BRIEF HOSPITAL COURSE:  [name] is a very pleasant 65 year old woman with history of morbid obesity, diabetes, diastolic heart failure, and pulmonary hypertension.  We have been struggling with her as an outpatient with significant volume overload.  She reported to clinic on August 22, she was markedly dyspneic and her weight was up about 15 or 20 pounds, which was not responding to outpatient titration of her diuretics.  We admitted her for further evaluation and treatment.  At the time of admission, BNP was 1426.  We started her on IV Lasix with sluggish response.  We then made the decision to proceed with ultrafiltration.  We placed a right subclavian central line.  We were able to ultrafilter her about 4 liters and she did diurese some with IV Lasix.  Her initial weight was 136.8 kg, which then got down to 128.2 kg.  Her creatinine then started to bump from baseline of around 1-1.6. We stopped ultrafiltration and her creatinine came back to 1.4.  Her CVP at the time of discharge was in the 10-11 range.  This was felt to be optimized for her.  Discharge BNP is currently pending.  I suspect her baseline weight will run approximately around 130 kg.  While she was in the hospital, I stressed to her the absolute need to lose weight to improve her cardiorespiratory status.  I do suspect she has some  component of pulmonary arterial hypertension, and we discussed possibility of repeating her right heart cath, which has not been done since 2008 to further evaluate this.  We have decided to defer at this time.  We will continue to follow her oxygenation closely.  She will continue with her CPAP for obstructive sleep apnea.  She did remain anemic while in the hospital and I have suggested a possible outpatient hematology workup.  With regard to her diuretic regimen, we will try her on Demadex 20 b.i.d. and spironolactone 25 once a day.  We will have to follow her renal function very closely.  MEDICINES ON DISCHARGE: 1. Demadex 20 b.i.d. 2. Dexilant 60 mg a day. 3. Tessalon Perles 100 mg p.o. 1 tablet at bedtime. 4. Vitamin D2 50,000 units 1 capsule on Monday and Thursday. 5. Simvastatin 40 mg a day. 6. Sertraline 50 mg a day. 7. Lorazepam 1 mg b.i.d. 8. Lantus insulin 42-80 units twice daily. 9. Ferrous sulfate 83 mg 1 tablet b.i.d. 10.Vitamin B12 injections monthly. 11.Coreg 3.125 mg b.i.d. 12.Aspirin 325 once a day. 13.Apidra 10 units every morning. 14.Spironolactone 25 mg a day.  FOLLOWUP: 1. After discharge will be in the [facility]  next     week.  We will call her with the appointment. 2. With Dr. Clarene Duke in the next couple of weeks. 3. I have suggested a possible outpatient anemia workup with     Hematology.  Total time for discharge encounter including HF education = 50 minutes.   [name]. Abigayl Hor, MD     DRB/MEDQ  D:  03/14/2011  T:  03/14/2011  Job:  [id number]  cc:   Caryn Bee L. Little, M.D.  Electronically Signed by Arvilla Meres MD on 03/17/2011 05:11:13 PM

## 2011-03-17 NOTE — Patient Instructions (Signed)
Labs today  Your physician recommends that you schedule a follow-up appointment in: 3-4 weeks  

## 2011-03-17 NOTE — Assessment & Plan Note (Signed)
NYHA II volume status stable. Prescription for knee high compression stockings. Check BMET today. Instructed to weigh daily and record results. If weight is increased 3 pounds she is instructed to call.Increase activity and consider weight watchers.  Follow up 1 month.

## 2011-03-18 NOTE — Telephone Encounter (Signed)
KC, please advise if ok to refill or not?  Pt last saw you on 03/10/11.  We gave her a refill on tessalon perles that day for # 30 x 0 refills.

## 2011-03-23 NOTE — Progress Notes (Signed)
Reviewed with pt at OV.

## 2011-03-24 NOTE — Telephone Encounter (Signed)
Denied per [facility].

## 2011-03-26 ENCOUNTER — Ambulatory Visit (HOSPITAL_COMMUNITY): Payer: Medicare Other

## 2011-04-03 ENCOUNTER — Telehealth: Payer: Self-pay | Admitting: *Deleted

## 2011-04-03 NOTE — Telephone Encounter (Signed)
Per Dr. Luciana Axe patient needs new referral from Dr. Rikki Spearing office, due to this being an entirely new problem.  Dr. Rikki Spearing' office notified and given phone number for our referral consultant, Samule Ohm. Wendall Mola CMA

## 2011-04-03 NOTE — Telephone Encounter (Signed)
May need oral treatment for MRSA.  She should be sent to her primary physician for cellulitis management.

## 2011-04-03 NOTE — Telephone Encounter (Signed)
Received call from [facility] Dr. Elinor Dodge office requesting patient be seen for worsening infection in her legs, believed to be caused by a brown recluse spider bite.  Patient was given Augmentin and Dapsone and has been on for 3 days with no improvement.  Dr. Raynald Kemp says the wounds have worsened.  Wants her seen ASAP.  Dr. Luciana Axe first available appt. 04/30/11 and Dr. Daiva Eves 04/08/11.  Please advise. Wendall Mola CMA

## 2011-04-06 ENCOUNTER — Telehealth: Payer: Self-pay | Admitting: *Deleted

## 2011-04-06 NOTE — Telephone Encounter (Signed)
She is concerned about the area on her r foot. States she is taking oral antibiotics and using cremes on it as ordered. Wanted to come in earlier than wed. We do not have anything earlier. Told her to use ED if foot becomes cold or blue or pain increases. If an appt openes sooner, I will call her

## 2011-04-08 ENCOUNTER — Encounter: Payer: Self-pay | Admitting: Infectious Disease

## 2011-04-08 ENCOUNTER — Other Ambulatory Visit: Payer: Self-pay | Admitting: Infectious Disease

## 2011-04-08 ENCOUNTER — Ambulatory Visit (INDEPENDENT_AMBULATORY_CARE_PROVIDER_SITE_OTHER): Payer: Medicare Other | Admitting: Infectious Disease

## 2011-04-08 VITALS — BP 121/50 | HR 80 | Temp 97.9°F | Wt 287.0 lb

## 2011-04-08 DIAGNOSIS — E1169 Type 2 diabetes mellitus with other specified complication: Secondary | ICD-10-CM

## 2011-04-08 DIAGNOSIS — L0390 Cellulitis, unspecified: Secondary | ICD-10-CM

## 2011-04-08 DIAGNOSIS — L97311 Non-pressure chronic ulcer of right ankle limited to breakdown of skin: Secondary | ICD-10-CM | POA: Insufficient documentation

## 2011-04-08 DIAGNOSIS — L97509 Non-pressure chronic ulcer of other part of unspecified foot with unspecified severity: Secondary | ICD-10-CM | POA: Insufficient documentation

## 2011-04-08 DIAGNOSIS — L97309 Non-pressure chronic ulcer of unspecified ankle with unspecified severity: Secondary | ICD-10-CM

## 2011-04-08 LAB — CBC WITH DIFFERENTIAL/PLATELET
Basophils Absolute: 0 10*3/uL (ref 0.0–0.1)
Eosinophils Relative: 5 % (ref 0–5)
HCT: 29.8 % — ABNORMAL LOW (ref 36.0–46.0)
Lymphocytes Relative: 18 % (ref 12–46)
MCH: 29.3 pg (ref 26.0–34.0)
MCV: 94 fL (ref 78.0–100.0)
Monocytes Absolute: 0.9 10*3/uL (ref 0.1–1.0)
RDW: 14.7 % (ref 11.5–15.5)
WBC: 10.4 10*3/uL (ref 4.0–10.5)

## 2011-04-08 MED ORDER — DOXYCYCLINE HYCLATE 100 MG PO TABS: 100.0000 mg | ORAL_TABLET | Freq: Two times a day (BID) | ORAL | Status: DC

## 2011-04-08 NOTE — Progress Notes (Signed)
Subjective:    Patient ID: [name], female    DOB: [birth year], 65 y.o.   MRN: [id number]  HPI 65 year old Caucasian female with multiple medical problems including severe congestive heart failure that has required ultrafiltration during most recent hospitalization. She was seen by my partner Dr., her after she had had severe cellulitis that had not responded to Augmentin. She then was treated successfully with doxycycline with resolution of her cellulitis. However in the interval she developed an area on the medial aspect of her right foot along the malleolus that she describes as being like a "spider bite". In fact one of her physicians apparently told her that it was a stat motor bike. Neck is became erythematous and draining purulent material from it. She also developed new hyperpigmented lesions on her toes, not dishonest not dissimilar from embolic phenomena. Finally she developed a an area on the anterior and posterior shin that have are black and is surrounded by a swollen and quite tender soft tissue. She has been treated by her physician at the local foot center with dapsone orally as well as Augmentin without improvement and if her symptoms of pain in her calves and pain specifically around the-like lesions on her left calf. She is return to clinic for evaluation of these lesions. She she does endorse fevers up to 100 denies nausea vomiting malaise  lightheadedness with standing. She also Dr. [facility]). We spent greater than 45 minutes with the pt including greater than 50% of time inf face to face counselling of the patient and in coordination of care Review of Systems  Constitutional: Negative for fever, chills, diaphoresis, activity change, appetite change, fatigue and unexpected weight change.  HENT: Negative for congestion, sore throat, rhinorrhea, sneezing, trouble swallowing and sinus pressure.   Eyes: Negative for photophobia and visual disturbance.    Respiratory: Negative for cough, chest tightness, shortness of breath, wheezing and stridor.   Cardiovascular: Positive for leg swelling. Negative for chest pain and palpitations.  Gastrointestinal: Negative for nausea, vomiting, abdominal pain, diarrhea, constipation, blood in stool, abdominal distention and anal bleeding.  Genitourinary: Negative for dysuria, hematuria, flank pain and difficulty urinating.  Musculoskeletal: Negative for myalgias, back pain, joint swelling, arthralgias and gait problem.  Skin: Positive for color change, rash and wound. Negative for pallor.  Neurological: Negative for dizziness, tremors, weakness and light-headedness.  Hematological: Negative for adenopathy. Does not bruise/bleed easily.  Psychiatric/Behavioral: Negative for behavioral problems, confusion, sleep disturbance, dysphoric mood, decreased concentration and agitation.       Objective:   Physical Exam  Constitutional: She is oriented to person, place, and time. She appears well-developed and well-nourished. No distress.  HENT:  Head: Normocephalic and atraumatic.  Mouth/Throat: Oropharynx is clear and moist. No oropharyngeal exudate.  Eyes: Conjunctivae and EOM are normal. Pupils are equal, round, and reactive to light. No scleral icterus.  Neck: Normal range of motion. Neck supple. No JVD present.  Cardiovascular: Normal rate, regular rhythm and normal heart sounds.  Exam reveals no gallop and no friction rub.   No murmur heard. Pulmonary/Chest: Effort normal and breath sounds normal. No respiratory distress. She has no wheezes. She has no rales. She exhibits no tenderness.  Abdominal: She exhibits no distension and no mass. There is no tenderness. There is no rebound and no guarding.  Musculoskeletal: She exhibits no edema and no tenderness.       Legs:      Large dried black eschar her left shin  a similar lesion on the posterior left shin as well as smaller black lesions. She is extremely  tender around the larger lesions on the anterior shin and posterior shin. The opposite leg and the right ankle along the lateral medial malleolus reveals an ulcerated lesion that is that is has some clear material from it. Her toes on this foot the right foot have some black areas on the distal toes concerning for possible embolic phenomenon she also has some chronic drainage in the second digit here.  Lymphadenopathy:    She has no cervical adenopathy.  Neurological: She is alert and oriented to person, place, and time. She has normal reflexes. She exhibits normal muscle tone. Coordination normal.  Skin: Skin is warm and dry. She is not diaphoretic. There is erythema. No pallor.  Psychiatric: She has a normal mood and affect. Her behavior is normal. Judgment and thought content normal.          Assessment & Plan:  Diabetic foot ulcer The left shin is concerning for a more severe infection given the eschars that we are seeing in the tenderness that she has is present. I think that she does warrant an MRI of this the tib-fib on the side. The opposite leg has an ulcer on the medial malleolus that is reportedly improving. However she also has some areas in her distal toes concerning for possible possible emboli. I will perform an MRI of the left tib-fib as well as an MRI of the right foot. Change her antibiotics to doxycycline for now. Certainly if her MRI imaging is concerning he may have to bring bring her in for debridement by either general or orthopedic surgeon. Also check blood cultures CBC and metabolic panel  Cellulitis See above also check arterial brachial indices  Non-pressure chronic ulcer of right ankle, limited to breakdown of skin See above

## 2011-04-08 NOTE — Assessment & Plan Note (Signed)
See above

## 2011-04-08 NOTE — Assessment & Plan Note (Signed)
The left shin is concerning for a more severe infection given the eschars that we are seeing in the tenderness that she has is present. I think that she does warrant an MRI of this the tib-fib on the side. The opposite leg has an ulcer on the medial malleolus that is reportedly improving. However she also has some areas in her distal toes concerning for possible possible emboli. I will perform an MRI of the left tib-fib as well as an MRI of the right foot. Change her antibiotics to doxycycline for now. Certainly if her MRI imaging is concerning he may have to bring bring her in for debridement by either general or orthopedic surgeon. Also check blood cultures CBC and metabolic panel

## 2011-04-08 NOTE — Assessment & Plan Note (Signed)
See above also check arterial brachial indices

## 2011-04-09 ENCOUNTER — Encounter (HOSPITAL_COMMUNITY): Payer: Medicare Other

## 2011-04-09 LAB — BASIC METABOLIC PANEL WITH GFR
CO2: 24 mEq/L (ref 19–32)
Calcium: 8.9 mg/dL (ref 8.4–10.5)
Glucose, Bld: 164 mg/dL — ABNORMAL HIGH (ref 70–99)
Potassium: 5.2 mEq/L (ref 3.5–5.3)
Sodium: 141 mEq/L (ref 135–145)

## 2011-04-09 LAB — BASIC METABOLIC PANEL
CO2: 32
Chloride: 102
GFR calc Af Amer: >60
Sodium: 141

## 2011-04-13 ENCOUNTER — Telehealth: Payer: Self-pay | Admitting: *Deleted

## 2011-04-13 NOTE — Telephone Encounter (Signed)
Pt called asking when appt is. I called radiology. Next week 04/21/11 at 3pm. Told pt & asked that she get there at 2:45. She agreed with day & time

## 2011-04-15 LAB — CULTURE, BLOOD (SINGLE): Organism ID, Bacteria: NO GROWTH

## 2011-04-21 ENCOUNTER — Other Ambulatory Visit: Payer: Self-pay | Admitting: Infectious Disease

## 2011-04-21 ENCOUNTER — Ambulatory Visit (HOSPITAL_COMMUNITY)
Admission: RE | Admit: 2011-04-21 | Discharge: 2011-04-21 | Disposition: A | Discharge status: Home / Self Care | Payer: Medicare Other | Source: Ambulatory Visit | Attending: Infectious Disease | Admitting: Infectious Disease

## 2011-04-21 DIAGNOSIS — S81009A Unspecified open wound, unspecified knee, initial encounter: Secondary | ICD-10-CM | POA: Insufficient documentation

## 2011-04-21 DIAGNOSIS — S91009A Unspecified open wound, unspecified ankle, initial encounter: Secondary | ICD-10-CM | POA: Insufficient documentation

## 2011-04-21 DIAGNOSIS — L97509 Non-pressure chronic ulcer of other part of unspecified foot with unspecified severity: Secondary | ICD-10-CM

## 2011-04-21 DIAGNOSIS — E11621 Type 2 diabetes mellitus with foot ulcer: Secondary | ICD-10-CM

## 2011-04-21 DIAGNOSIS — L97809 Non-pressure chronic ulcer of other part of unspecified lower leg with unspecified severity: Secondary | ICD-10-CM | POA: Insufficient documentation

## 2011-04-21 DIAGNOSIS — X58XXXA Exposure to other specified factors, initial encounter: Secondary | ICD-10-CM | POA: Insufficient documentation

## 2011-04-22 ENCOUNTER — Encounter: Payer: Self-pay | Admitting: Infectious Disease

## 2011-04-22 ENCOUNTER — Ambulatory Visit (INDEPENDENT_AMBULATORY_CARE_PROVIDER_SITE_OTHER): Payer: Medicare Other | Admitting: Infectious Disease

## 2011-04-22 VITALS — BP 149/73 | HR 91 | Temp 97.9°F | Wt 288.0 lb

## 2011-04-22 DIAGNOSIS — L97509 Non-pressure chronic ulcer of other part of unspecified foot with unspecified severity: Secondary | ICD-10-CM

## 2011-04-22 DIAGNOSIS — G600 Hereditary motor and sensory neuropathy: Secondary | ICD-10-CM

## 2011-04-22 DIAGNOSIS — E1169 Type 2 diabetes mellitus with other specified complication: Secondary | ICD-10-CM

## 2011-04-22 DIAGNOSIS — E11621 Type 2 diabetes mellitus with foot ulcer: Secondary | ICD-10-CM

## 2011-04-22 DIAGNOSIS — I10 Essential (primary) hypertension: Secondary | ICD-10-CM

## 2011-04-22 DIAGNOSIS — M6250 Muscle wasting and atrophy, not elsewhere classified, unspecified site: Secondary | ICD-10-CM

## 2011-04-22 DIAGNOSIS — IMO0001 Reserved for inherently not codable concepts without codable children: Secondary | ICD-10-CM

## 2011-04-22 DIAGNOSIS — M609 Myositis, unspecified: Secondary | ICD-10-CM

## 2011-04-22 LAB — C-REACTIVE PROTEIN: CRP: 2.27 mg/dL — ABNORMAL HIGH (ref <0.60)

## 2011-04-22 LAB — BASIC METABOLIC PANEL WITH GFR
BUN: 36 mg/dL — ABNORMAL HIGH (ref 6–23)
Creat: 1.19 mg/dL — ABNORMAL HIGH (ref 0.50–1.10)
GFR, Est African American: 55 mL/min — ABNORMAL LOW (ref >60)
GFR, Est Non African American: 45 mL/min — ABNORMAL LOW (ref >60)
Potassium: 4.7 mEq/L (ref 3.5–5.3)

## 2011-04-22 LAB — CBC WITH DIFFERENTIAL/PLATELET
Eosinophils Absolute: 0.6 10*3/uL (ref 0.0–0.7)
Hemoglobin: 9.7 g/dL — ABNORMAL LOW (ref 12.0–15.0)
Lymphs Abs: 2 10*3/uL (ref 0.7–4.0)
MCH: 28.4 pg (ref 26.0–34.0)
Neutro Abs: 6.1 10*3/uL (ref 1.7–7.7)
Neutrophils Relative %: 65 % (ref 43–77)
Platelets: 191 10*3/uL (ref 150–400)
RBC: 3.41 MIL/uL — ABNORMAL LOW (ref 3.87–5.11)
WBC: 9.4 10*3/uL (ref 4.0–10.5)

## 2011-04-22 LAB — SEDIMENTATION RATE: Sed Rate: 130 mm/hr — ABNORMAL HIGH (ref 0–22)

## 2011-04-22 LAB — CK: Total CK: 102 U/L (ref 7–177)

## 2011-04-22 MED ORDER — DOXYCYCLINE HYCLATE 100 MG PO TABS: 100.0000 mg | ORAL_TABLET | Freq: Two times a day (BID) | ORAL | Status: AC

## 2011-04-22 NOTE — Assessment & Plan Note (Signed)
If this is what is causing her pain, not much to do except supportive care

## 2011-04-22 NOTE — Progress Notes (Signed)
Subjective:    Patient ID: [name], female    DOB: [birth year], 65 y.o.   MRN: [id number]  HPI  65 year old Caucasian female with multiple medical problems including severe congestive heart failure that has required ultrafiltration during most recent hospitalization. She was seen by my partner Dr., her after she had had severe cellulitis that had not responded to Augmentin. She then was treated successfully with doxycycline with resolution of her cellulitis. However in the interval she developed an area on the medial aspect of her right foot along the malleolus that she describes as being like a "spider bite". In fact one of her physicians apparently told her that it was a stat motor bike. Neck is became erythematous and draining purulent material from it. She also developed new hyperpigmented lesions on her toes, not dishonest not dissimilar from embolic phenomena. Finally she developed a an area on the anterior and posterior shin that have are black and is surrounded by a swollen and quite tender soft tissue. She has been treated by her physician at the local foot center with dapsone orally as well as Augmentin without improvement and if her symptoms of pain in her calves and pain specifically around the-like lesions on her left calf. I saw her two weeks ago for evluation of  f these lesions. We obtained MRI of the right foot, right ankle and left tib/fib Results showed:  MRI RIGHT FOOT:  Findings: There is subcutaneous edema about the right foot. Edema  about the ankle and midfoot appears improved since the comparison  study. No focal fluid collection to suggest abscess is identified.  No evidence of osteomyelitis is identified. Intrinsic musculature  and tendons of the foot appear intact. There is some midfoot  degenerative disease.  IMPRESSION:  Subcutaneous edema about the foot and ankle. Changes about the  ankle appear improved since the comparison study. Negative for  abscess or  osteomyelitis.  MRI right ankle: There is no bone marrow signal abnormality to suggest  osteomyelitis. The patient has diffuse and intense subcutaneous  edema without a focal collection identified throughout the right  lower leg. The coronal sequences incidentally include the right  lower leg. There is edema in the gastrocnemius musculature  bilaterally which appears more intense on the right. Also seen is  some fatty replacement the gastrocnemius musculature bilaterally,  much worse on the right. No focal fluid collection within muscle  is identified. No tear is seen.   IMPRESSION:  1. Negative for osteomyelitis.  2. Intense, diffuse subcutaneous edema compatible with cellulitis  and/or dependent change throughout the right lower leg.  3. Edema within the gastrocnemius musculature bilaterally appears  worse on the right. Finding is most likely due to denervation  atrophy given fatty replacement musculature. Coexistent myositis  cannot be excluded.  Mri TIB FIB LEFT:  There is no bone marrow signal abnormality to suggest  osteomyelitis. The patient has diffuse and intense subcutaneous  edema without a focal collection identified throughout the right  lower leg. The coronal sequences incidentally include the right  lower leg. There is edema in the gastrocnemius musculature  bilaterally which appears more intense on the right. Also seen is  some fatty replacement the gastrocnemius musculature bilaterally,  much worse on the right. No focal fluid collection within muscle  is identified. No tear is seen.  IMPRESSION:  1. Negative for osteomyelitis.  2. Intense, diffuse subcutaneous edema compatible with cellulitis  and/or dependent change throughout the right lower leg.  3. Edema  within the gastrocnemius musculature bilaterally appears  worse on the right. Finding is most likely due to denervation  atrophy given fatty replacement musculature. Coexistent myositis  cannot be  excluded.  Her ESR was also elevated above 100 on check here. Since I saw her she continues to suffer from severe pain in her left calf. She continues to have drainage in ulcers in left foot esp after one eschar came off a few days ago. She denies fevers or chills or nausea. Review of Systems  Constitutional: Negative for fever, chills, diaphoresis, activity change, appetite change, fatigue and unexpected weight change.  HENT: Negative for congestion, sore throat, rhinorrhea, sneezing, trouble swallowing and sinus pressure.   Eyes: Negative for photophobia and visual disturbance.  Respiratory: Negative for cough, chest tightness, shortness of breath, wheezing and stridor.   Cardiovascular: Negative for chest pain, palpitations and leg swelling.  Gastrointestinal: Negative for nausea, vomiting, abdominal pain, diarrhea, constipation, blood in stool, abdominal distention and anal bleeding.  Genitourinary: Negative for dysuria, hematuria, flank pain and difficulty urinating.  Musculoskeletal: Positive for myalgias, joint swelling and arthralgias. Negative for back pain and gait problem.  Skin: Positive for color change and wound. Negative for pallor and rash.  Neurological: Negative for dizziness, tremors, weakness and light-headedness.  Hematological: Negative for adenopathy. Does not bruise/bleed easily.  Psychiatric/Behavioral: Negative for behavioral problems, confusion, sleep disturbance, dysphoric mood, decreased concentration and agitation.       Objective:   Physical Exam  Constitutional: She is oriented to person, place, and time. She appears well-developed and well-nourished. No distress.  HENT:  Head: Normocephalic and atraumatic.  Mouth/Throat: Oropharynx is clear and moist. No oropharyngeal exudate.  Eyes: Conjunctivae and EOM are normal. Pupils are equal, round, and reactive to light. No scleral icterus.  Neck: Normal range of motion. Neck supple. No JVD present.    Cardiovascular: Normal rate, regular rhythm and normal heart sounds.  Exam reveals no gallop and no friction rub.   No murmur heard. Pulmonary/Chest: Effort normal and breath sounds normal. No respiratory distress. She has no wheezes. She has no rales. She exhibits no tenderness.  Abdominal: She exhibits no distension and no mass. There is no tenderness. There is no rebound and no guarding.  Musculoskeletal: She exhibits no edema and no tenderness.       Feet:  Lymphadenopathy:    She has no cervical adenopathy.  Neurological: She is alert and oriented to person, place, and time. She has normal reflexes. She exhibits normal muscle tone. Coordination normal.  Skin: Skin is warm and dry. She is not diaphoretic. There is erythema. No pallor.          Several eschars with blackened scar tissue on toes with one that has been unroofed draining clear material, legs themselves with chronic erythema and edema. Left calf tender  Psychiatric: She has a normal mood and affect. Her behavior is normal. Judgment and thought content normal.          Assessment & Plan:

## 2011-04-22 NOTE — Assessment & Plan Note (Signed)
?   If this is myositis vs denervation atrophy (radiology thought latter). Will check cpk and continue prolonged course of doxy.Doubt autoimmune disease

## 2011-04-22 NOTE — Assessment & Plan Note (Signed)
Continue doxycyline, fu with podiatrist for local care, fu with me in one month. Recheck esr, crp labs

## 2011-04-30 ENCOUNTER — Ambulatory Visit (HOSPITAL_COMMUNITY)
Admission: RE | Admit: 2011-04-30 | Discharge: 2011-04-30 | Disposition: A | Discharge status: Home / Self Care | Payer: Medicare Other | Source: Ambulatory Visit | Attending: Internal Medicine | Admitting: Internal Medicine

## 2011-04-30 VITALS — BP 130/76 | HR 77 | Wt 291.8 lb

## 2011-04-30 DIAGNOSIS — E1169 Type 2 diabetes mellitus with other specified complication: Secondary | ICD-10-CM

## 2011-04-30 DIAGNOSIS — E11621 Type 2 diabetes mellitus with foot ulcer: Secondary | ICD-10-CM

## 2011-04-30 DIAGNOSIS — L97509 Non-pressure chronic ulcer of other part of unspecified foot with unspecified severity: Secondary | ICD-10-CM

## 2011-04-30 DIAGNOSIS — I5032 Chronic diastolic (congestive) heart failure: Secondary | ICD-10-CM | POA: Insufficient documentation

## 2011-04-30 DIAGNOSIS — I509 Heart failure, unspecified: Secondary | ICD-10-CM

## 2011-04-30 NOTE — Patient Instructions (Signed)
Increase Demadex to 20 mg twice a day for the next 3 days.    Follow up with podiatrist tomorrow.  Please return for follow up in 1 month.  Call for increase weight of 3 lbs in 24 hours.

## 2011-04-30 NOTE — Progress Notes (Signed)
Patient seen and examined with Nicki Bradley PA-C. We discussed all aspects of the encounter. I agree with the assessment and plan as stated above.   

## 2011-04-30 NOTE — Assessment & Plan Note (Addendum)
Volume status mildly elevated.  NYHA II-III but mostly limited by back pain.  Will increase demadex to 20 mg BID x 3days then return to 20 mg daily.  Sliding scale demadex discussed.    Patient seen and examined with Ulyess Blossom PA-C. We discussed all aspects of the encounter. I agree with the assessment and plan as stated above.

## 2011-04-30 NOTE — Progress Notes (Signed)
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 female with a history of morbid obesity complicated by obesity hypoventilation syndrome, hypertension, sleep apnea, diabetes, CRI and mild secondary pulmonary hypertension as well as hypokalemia. Carotid stenosis 40-59% bilaterally.  Initially, she had an echocardiogram read as severe LV dysfunction with an EF of 15-20%. She underwent catheterization in 2008 that showed minimal nonobstructive coronary artery disease with EF 55%. She had mild pulmonary hypertension with both pulmonary venous and pulmonary arterial components. Her PVR was 5.1 Wood units. Echo in 3/09 EF 60%.  Saw Dr. Antoine Poche in 8/11 over question of possible AF. ECG with PACs. 24 monitor NSR with occ PACs and PVCS. No AF.    She saw Dr. Gala Romney in 6/12 with increased edema.  He added metolazone.  Repeat echo 01/30/11, EF 55-60%, mild LAE.  Carotid dopplers:  40-59% bilateral - needs follow up in one year.  She saw Dr. Shelle Iron and venous dopplers were negative for DVT bilaterally.  Has had multiple I saw her recently with probable right leg cellulitis.  I placed her on Augmentin and torsemide.  Her swelling did not improve and she continued to have low-grade fevers.  I was concerned she was developing osteomyelitis.  MRI was negative for osteomyelitis but did confirm cellulitis.  Cipro had been added to her medical regimen.  I had also increased her torsemide.  Her creatinine began to go up and I cut back on her torsemide.  When I last talked to the patient over the phone, her symptoms were improved.  Her redness and swelling in the right leg had improved but not completely resolved.  Dr. Luciana Axe with infectious disease evaluated her on 8/1 and changed her abx to doxycycline for 2 weeks, she completed this course.  Lab work on 8/17 showed improvement on Cr/BUN, as below.    Labs:  (12/29/10):  K 5, creat 1.0 Labs:   (01/06/11):  K 4.5, creat 1.1 Labs:  (02/02/11):  Na 134, K 4.1, creat 1.2 Labs:  (02/06/11):  Na 140, K 4.6, BUN 39, creat 1.2, WBC 11.6, Hgb 11.8 Labs:  (02/09/11):  Na 137, K 3.9, BUN 41, creat 1.5 Labs:  (03/06/11):  Na 145, K 3.4, BUN 30, Cr 1.2  Over the last week she has been complaining of increased nonproductive cough, weight gain, dyspnea with minimal exertion.  Denies CP.  Was evaluated by Dr. Shelle Iron yesterday for chronic cough.  He felt with the barking nature of the cough it was upper airway in origin.  He also discontinued her ACE inhibitor and placed her on Benicar 20mg  daily.  Pt was hospitalized at the end of August 2012 for acute on chronic diastolic heart failure.  Diuresed ~17 lbs with discharge weight of 282 lbs.    Since discharge she has been evaluated by Dr. Daiva Eves and is being treated diabetic foot ulcers and ? Myositis on doxycycline.  She is having pain in her lower legs and back.  She denies dyspnea, orthopnea or PND currently.  Continues to wear BiPap at night.  Weight slowly increasing since diagnosed with infection.  Increase in lower extremity edema noted.  Continues to have a dry cough.  Taking demadex 20 mg daily.  Has not taken extra doses.  No CP/dizziness.     ROS: All systems negative except as listed in HPI, PMH and Problem List.  Past Medical History  Diagnosis Date  .  OSA (obstructive sleep apnea)   . Thrombophlebitis of deep vein of leg   . DM (diabetes mellitus)   . CHF (congestive heart failure)     hx of EF 15-20% in 4/08,  echo 3/09 EF 60-65%;   echo 6/12: EF 55-60%, mild LAE  . Carotid stenosis     u/s 6/10: R 0-39%Ll 40-59%;  u/s 7/12: 40-59% bilateral (repeat in 7/13)  . HTN (hypertension)   . Morbid obesity   . Pulmonary HTN     mild,  cath 6/08 with PVR 5.1  . Hypokalemia   . 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 Spinal stenosis   . Obesity hypoventilation syndrome     Current Outpatient Prescriptions    Medication Sig Dispense Refill  . aspirin (BAYER ASPIRIN) 325 MG tablet Take 325 mg by mouth daily.        . carvedilol (COREG) 3.125 MG tablet Take 3.125 mg by mouth 2 (two) times daily with a meal.       . cyanocobalamin (,VITAMIN B-12,) 1000 MCG/ML injection Inject 1,000 mcg into the muscle every 30 (thirty) days.       Marland Kitchen doxycycline (VIBRA-TABS) 100 MG tablet Take 1 tablet (100 mg total) by mouth 2 (two) times daily.  60 tablet  2  . Ferrous Sulfate 83 MG TABS Take 1 tablet by mouth 2 (two) times daily.        . insulin glargine (LANTUS) 100 UNIT/ML injection Use as directed 42 UNITS in the morning and 80 units at night      . insulin glulisine (APIDRA) 100 UNIT/ML injection Inject 10 Units into the skin daily.       Marland Kitchen LORazepam (ATIVAN) 1 MG tablet Take 1 mg by mouth 2 (two) times daily.        . sertraline (ZOLOFT) 100 MG tablet Take 1/2 tab by mouth once daily       . simvastatin (ZOCOR) 40 MG tablet Take 40 mg by mouth at bedtime.        Marland Kitchen spironolactone (ALDACTONE) 25 MG tablet Take 25 mg by mouth daily.        Marland Kitchen torsemide (DEMADEX) 20 MG tablet Take 20 mg by mouth daily.       . Vitamin D, Ergocalciferol, (DRISDOL) 50000 UNITS CAPS Take by mouth. Take 2 capsules weekly       . perindopril (ACEON) 8 MG tablet Take 8 mg by mouth daily.           PHYSICAL EXAM: Filed Vitals:   04/30/11 1224  BP: 130/76  Pulse: 77  Wt 291lbs  General:  Well appearing. No resp difficulty HEENT: normal Neck: supple. JVP difficult to assess secondary to body habitus.  flat. Carotids 2+ bilaterally; no bruits. No lymphadenopathy or thryomegaly appreciated. Cor: PMI normal. Regular rate & rhythm. No rubs, gallops or murmurs. Lungs: course breath sounds throughout  Abdomen: soft, nontender, nondistended. No hepatosplenomegaly. No bruits or masses. Good bowel sounds. Extremities: Rt 3-5 toes with eschar, Rt medial foot with open wound and pus visible. Lt calf with wound.  Multiple healing areas of  bilateral calves.  Edema 1+ below knee bilaterally.   Neuro: alert & orientedx3, cranial nerves grossly intact. Moves all 4 extremities w/o difficulty. Affect pleasant.     ASSESSMENT & PLAN:

## 2011-04-30 NOTE — Assessment & Plan Note (Signed)
This has been managed by Dr. Daiva Eves and her podiatrist. Pending ABIs. In clinic today, seen by Tonye Becket, NP for wound care. Wounds were debrided and dressed. Have recommended wrapping with ACE wrap for mild compression. Avoid tight compression as there may be arterial component.

## 2011-05-04 ENCOUNTER — Other Ambulatory Visit: Payer: Self-pay | Admitting: Internal Medicine

## 2011-05-07 LAB — POCT I-STAT 3, VENOUS BLOOD GAS (G3P V)
Operator id: 194801
Operator id: 194801
pCO2, Ven: 48.9
pH, Ven: 7.357 — ABNORMAL HIGH
pH, Ven: 7.373 — ABNORMAL HIGH

## 2011-05-07 LAB — POCT I-STAT 3, ART BLOOD GAS (G3+)
O2 Saturation: 90
TCO2: 28
pH, Arterial: 7.419 — ABNORMAL HIGH

## 2011-05-14 ENCOUNTER — Telehealth (HOSPITAL_COMMUNITY): Payer: Self-pay | Admitting: *Deleted

## 2011-05-14 NOTE — Telephone Encounter (Signed)
Pt is not sure if she is supposed to be on Aceon or not, she has not been taking it, according to records she should be on 8 mg daily, however she thinks it was stopped when she was d/c'd from hospital and has not been taking it, her BP is good 110-120s she will continue to monitor and bring all meds to next appt to review

## 2011-05-14 NOTE — Telephone Encounter (Signed)
Ms Wards sister called this am,  They are not sure as to which bp pills she should be taking, and they would like a call back.

## 2011-05-18 ENCOUNTER — Telehealth: Payer: Self-pay | Admitting: Pulmonary Disease

## 2011-05-18 NOTE — Telephone Encounter (Signed)
Called and spoke with pt.  Pt aware of ONO results.  Pt verbalized understanding and denied any questions.

## 2011-05-18 NOTE — Telephone Encounter (Signed)
ONO on RA and bipap shows low sat of 77%, and spent 4 hrs less than 88%  Megan, please call pt and make sure she is currently wearing oxygen with her bipap at night.  Thanks

## 2011-05-21 ENCOUNTER — Encounter (HOSPITAL_BASED_OUTPATIENT_CLINIC_OR_DEPARTMENT_OTHER): Payer: Medicare Other | Attending: Internal Medicine

## 2011-05-21 DIAGNOSIS — Z79899 Other long term (current) drug therapy: Secondary | ICD-10-CM | POA: Insufficient documentation

## 2011-05-21 DIAGNOSIS — I739 Peripheral vascular disease, unspecified: Secondary | ICD-10-CM | POA: Insufficient documentation

## 2011-05-21 DIAGNOSIS — L97809 Non-pressure chronic ulcer of other part of unspecified lower leg with unspecified severity: Secondary | ICD-10-CM | POA: Insufficient documentation

## 2011-05-21 DIAGNOSIS — I5032 Chronic diastolic (congestive) heart failure: Secondary | ICD-10-CM | POA: Insufficient documentation

## 2011-05-21 DIAGNOSIS — F329 Major depressive disorder, single episode, unspecified: Secondary | ICD-10-CM | POA: Insufficient documentation

## 2011-05-21 DIAGNOSIS — E538 Deficiency of other specified B group vitamins: Secondary | ICD-10-CM | POA: Insufficient documentation

## 2011-05-21 DIAGNOSIS — E119 Type 2 diabetes mellitus without complications: Secondary | ICD-10-CM | POA: Insufficient documentation

## 2011-05-21 DIAGNOSIS — Z794 Long term (current) use of insulin: Secondary | ICD-10-CM | POA: Insufficient documentation

## 2011-05-21 DIAGNOSIS — I2789 Other specified pulmonary heart diseases: Secondary | ICD-10-CM | POA: Insufficient documentation

## 2011-05-21 DIAGNOSIS — F3289 Other specified depressive episodes: Secondary | ICD-10-CM | POA: Insufficient documentation

## 2011-05-21 DIAGNOSIS — I872 Venous insufficiency (chronic) (peripheral): Secondary | ICD-10-CM | POA: Insufficient documentation

## 2011-05-21 DIAGNOSIS — I130 Hypertensive heart and chronic kidney disease with heart failure and stage 1 through stage 4 chronic kidney disease, or unspecified chronic kidney disease: Secondary | ICD-10-CM | POA: Insufficient documentation

## 2011-05-21 DIAGNOSIS — N189 Chronic kidney disease, unspecified: Secondary | ICD-10-CM | POA: Insufficient documentation

## 2011-05-21 NOTE — Progress Notes (Unsigned)
[facility]  NAME:  [name], [name] NO.:  [id number]  MEDICAL RECORD NO.:  [id number]      DATE OF BIRTH:  1945-12-08  PHYSICIAN:  Maxwell Caul, M.D.      VISIT DATE:                                  OFFICE VISIT   [name] is a 65 year old woman who arrived today accompanied by her sister for a bilateral lower extremity wounds, which she has been dealing with since June/July of this year.  There is no obvious history of trauma.  I note that the patient was admitted to hospital from March 11, 2011, through March 14, 2011, by Dr. Gala Romney with acute-on- chronic diastolic heart failure in the setting of pulmonary hypertension and cardiorenal syndrome.  Her BNP at that time was 1426.  She was discharged on Demadex 20 mg b.i.d.  There is an MRI of her leg from April 21, 2011, that was negative for osteomyelitis.  She had an intense diffuse subcutaneous edema of the left leg compatible with cellulitis and edema of the gastrocnemius musculature worse on the right.  Her wounds have been largely followed by [facility].  They actually I think referred her here.  They noted deteriorating wounds on the left anterior shin as well as the nonhealing right ankle lesion. There is a culture from their office on March 31, 2011, which showed methicillin sensitive staph aureus, and she has been on longstanding doxycycline.  PAST MEDICAL HISTORY: 1. Diastolic heart failure, and echocardiogram that was present does     not show left ventricular dysfunction.  Surprisingly, her right     ventricular function and pulmonary artery pressures were good. 2. Pulmonary hypertension. 3. Cardiorenal syndrome with a baseline creatinine of 1.4. 4. Morbid obesity.  Question obesity hypoventilation syndrome.  She     uses nocturnal CPAP. 5. Anemia. 6. Type 2 diabetes, on Lantus. 7. Depression, on Zoloft. 8. Vitamin B12 deficiency.  MEDICATION  LISTS:  Reviewed.  The patient tells me she is on Demadex 20 mg 2 times a day And Aldactone.  As mentioned, she takes doxycycline 100 twice a day and has been on this for several weeks.  SOCIAL HISTORY:  She apparently lives at home with her husband and her sister who came with her has been responsible for the wound care.  PHYSICAL EXAMINATION:  VITAL SIGNS:  Her temperature is 99.2, pulse 72, respirations 18, blood pressure 122/74.  Blood glucose 142. RESPIRATORY:  Shallow bilaterally.  There is mild expiatory wheeze. CARDIAC:  Heart sounds are distant. NECK:  Her JVP is difficult to discern, however, due to soft tissue. However, I think it is elevated. ABDOMEN:  Morbidly obese.  There is no gross tenderness. EXTREMITIES:  Severe bilateral venous stasis without evidence of an acute DVT.  There is edema extending up her posterior calf on the left greater than right.  WOUND EXAMINATION:  There is actually a multitude of wounds here. Predominantly, the wound is on the right.  There are wounds on the left lateral lower extremity and left posterior lower extremity, and a over the right medial ankle.  All of these are in a similar state with an adherent tan-to-black eschar.  The one on the left lower extremity has an overt  surrounding cellulitis of about 5-6 times the wound diameter. There are also wounds on her right second toe and right lateral foot. All of these wounds underwent debridement.  Her peripheral pulses are palpable, and ABIs done in this clinic showed noncompressible vessels on the right at 1.41.  IMPRESSIONS: 1. Complicated set of wounds on her bilateral legs.  These are     obviously a combination of things, which include probably severe     venous stasis, some degree of systemic volume overload, PAD and     probable cellulitis on the left anterior leg.  The wound on the     left anterior leg was cultured as mentioned all of the wounds     present were debrided.  She  will be sent for arterial Doppler     studies.  We used Santyl dressings to all of the wounds that are     present.  We used a Kerlix and Coban wrap bilaterally, although if     ABIs are satisfactory she may actually tolerate more aggressive     compression. 2. Systemic volume overload.  I think this lady is in least some     degree of right heart failure or perhaps this is renal     insufficiency I am not sure, and her jugular venous pressure is     elevated.  She has edema above her knees, which suggest this is not     all venous stasis.  I would wonder if we can increase her Demadex     from current values, and we will call Dr. Prescott Gum office about     this.  As mentioned, we will get arterial Dopplers and consider     more aggressive compression.          ______________________________ Maxwell Caul, M.D.     MGR/MEDQ  D:  05/21/2011  T:  05/21/2011  Job:  [id number]

## 2011-05-25 ENCOUNTER — Ambulatory Visit (INDEPENDENT_AMBULATORY_CARE_PROVIDER_SITE_OTHER): Payer: Medicare Other

## 2011-05-25 DIAGNOSIS — L97509 Non-pressure chronic ulcer of other part of unspecified foot with unspecified severity: Secondary | ICD-10-CM

## 2011-05-25 DIAGNOSIS — L97209 Non-pressure chronic ulcer of unspecified calf with unspecified severity: Secondary | ICD-10-CM

## 2011-06-02 ENCOUNTER — Ambulatory Visit (HOSPITAL_COMMUNITY)
Admission: RE | Admit: 2011-06-02 | Discharge: 2011-06-02 | Disposition: A | Discharge status: Home / Self Care | Payer: Medicare Other | Source: Ambulatory Visit | Attending: Internal Medicine | Admitting: Internal Medicine

## 2011-06-02 ENCOUNTER — Encounter (HOSPITAL_COMMUNITY): Payer: Self-pay

## 2011-06-02 ENCOUNTER — Telehealth (HOSPITAL_COMMUNITY): Payer: Self-pay | Admitting: *Deleted

## 2011-06-02 VITALS — BP 136/68 | HR 68 | Wt 294.4 lb

## 2011-06-02 DIAGNOSIS — I5032 Chronic diastolic (congestive) heart failure: Secondary | ICD-10-CM

## 2011-06-02 DIAGNOSIS — I509 Heart failure, unspecified: Secondary | ICD-10-CM

## 2011-06-02 NOTE — Telephone Encounter (Signed)
Heather from [facility] called, they cannot do the labs for [name] and she would like you to call her back.  Thanks

## 2011-06-02 NOTE — Telephone Encounter (Signed)
Left mess for [name] that pt doesn't infact use [facility] but another home care agency so she can disregard the lab order

## 2011-06-02 NOTE — Progress Notes (Signed)
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 female with a history of morbid obesity complicated by obesity hypoventilation syndrome, hypertension, sleep apnea, diabetes, CRI and mild secondary pulmonary hypertension as well as hypokalemia. Carotid stenosis 40-59% bilaterally.  Initially, she had an echocardiogram read as severe LV dysfunction with an EF of 15-20%. She underwent catheterization in 2008 that showed minimal nonobstructive coronary artery disease with EF 55%. She had mild pulmonary hypertension with both pulmonary venous and pulmonary arterial components. Her PVR was 5.1 Wood units. Echo in 3/09 EF 60%.  Saw Dr. Antoine Poche in 8/11 over question of possible AF. ECG with PACs. 24 monitor NSR with occ PACs and PVCS. No AF.    She saw Dr. Gala Romney in 6/12 with increased edema.  He added metolazone.  Repeat echo 01/30/11, EF 55-60%, mild LAE.  Carotid dopplers:  40-59% bilateral - needs follow up in one year.  She saw Dr. Shelle Iron and venous dopplers were negative for DVT bilaterally.  Has had multiple I saw her recently with probable right leg cellulitis.  I placed her on Augmentin and torsemide.  Her swelling did not improve and she continued to have low-grade fevers.  I was concerned she was developing osteomyelitis.  MRI was negative for osteomyelitis but did confirm cellulitis.  Cipro had been added to her medical regimen.  I had also increased her torsemide.  Her creatinine began to go up and I cut back on her torsemide.  When I last talked to the patient over the phone, her symptoms were improved.  Her redness and swelling in the right leg had improved but not completely resolved.  Dr. Luciana Axe with infectious disease evaluated her on 8/1 and changed her abx to doxycycline for 2 weeks, she completed this course.  Lab work on 8/17 showed improvement on Cr/BUN, as below.    Labs:  (12/29/10):  K 5, creat  1.0 Labs:  (01/06/11):  K 4.5, creat 1.1 Labs:  (02/02/11):  Na 134, K 4.1, creat 1.2 Labs:  (02/06/11):  Na 140, K 4.6, BUN 39, creat 1.2, WBC 11.6, Hgb 11.8 Labs:  (02/09/11):  Na 137, K 3.9, BUN 41, creat 1.5 Labs:  (03/06/11):  Na 145, K 3.4, BUN 30, Cr 1.2  Over the last week she has been complaining of increased nonproductive cough, weight gain, dyspnea with minimal exertion.  Denies CP.  Was evaluated by Dr. Shelle Iron yesterday for chronic cough.  He felt with the barking nature of the cough it was upper airway in origin.  He also discontinued her ACE inhibitor and placed her on Benicar 20mg  daily.  Pt was hospitalized at the end of August 2012 for acute on chronic diastolic heart failure.  Diuresed ~17 lbs with discharge weight of 282 lbs.    She is here for follow up. Weight at home has not been accurate but she thinks her weight is going up. She isup 13 pounds.  SOB on exertion. Denies CP/dizziness/ PND/Orthopnea. [facility] on a weekly basis. [facility] following every week for compression wraps. She continues on Cipro and doxycycline. Complains of lower extremity edema. She is drinking more than 2 liters per day.    ROS: All systems negative except as listed in HPI, PMH and Problem List.  Past Medical History  Diagnosis Date  . OSA (obstructive sleep apnea)   . Thrombophlebitis of deep vein  of leg   . DM (diabetes mellitus)   . CHF (congestive heart failure)     hx of EF 15-20% in 4/08,  echo 3/09 EF 60-65%;   echo 6/12: EF 55-60%, mild LAE  . Carotid stenosis     u/s 6/10: R 0-39%Ll 40-59%;  u/s 7/12: 40-59% bilateral (repeat in 7/13)  . HTN (hypertension)   . Morbid obesity   . Pulmonary HTN     mild,  cath 6/08 with PVR 5.1  . Hypokalemia   . 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 Spinal stenosis   . Obesity hypoventilation syndrome     Current Outpatient Prescriptions  Medication Sig Dispense Refill  . aspirin  (BAYER ASPIRIN) 325 MG tablet Take 325 mg by mouth daily.        . carvedilol (COREG) 3.125 MG tablet Take 3.125 mg by mouth 2 (two) times daily with a meal.       . ciprofloxacin (CIPRO) 500 MG tablet Take 500 mg by mouth 2 (two) times daily.        . cyanocobalamin (,VITAMIN B-12,) 1000 MCG/ML injection Inject 1,000 mcg into the muscle every 30 (thirty) days.       Marland Kitchen doxycycline (DORYX) 100 MG DR capsule Take 100 mg by mouth 2 (two) times daily.        . ferrous sulfate 325 (65 FE) MG tablet Take 325 mg by mouth 2 (two) times daily.        . insulin glargine (LANTUS) 100 UNIT/ML injection Use as directed 42 UNITS in the morning and 80 units at night      . insulin glulisine (APIDRA) 100 UNIT/ML injection Inject 10 Units into the skin 3 (three) times daily before meals.       Marland Kitchen LORazepam (ATIVAN) 1 MG tablet Take 1 mg by mouth 2 (two) times daily.        . naproxen sodium (ANAPROX) 220 MG tablet Take 220 mg by mouth as needed. As needed for leg pain       . sertraline (ZOLOFT) 100 MG tablet Take 1/2 tab by mouth once daily       . simvastatin (ZOCOR) 40 MG tablet TAKE 1 TABLET BY MOUTH DAILY  90 tablet  1  . spironolactone (ALDACTONE) 25 MG tablet Take 25 mg by mouth daily.        Marland Kitchen torsemide (DEMADEX) 20 MG tablet Take 20 mg by mouth daily.       . Vitamin D, Ergocalciferol, (DRISDOL) 50000 UNITS CAPS Take by mouth. Take 2 capsules weekly       . perindopril (ACEON) 8 MG tablet Take 8 mg by mouth daily.           PHYSICAL EXAM: Filed Vitals:   06/02/11 1047  BP: 136/68  Pulse: 68  Wt 294  (291lbs)  General:  Well appearing. No resp difficulty HEENT: normal Neck: supple. JVP difficult to assess appears to be 10-11 Carotids 2+ bilaterally; no bruits. No lymphadenopathy or thryomegaly appreciated. Cor: PMI normal. Regular rate & rhythm. No rubs, gallops or murmurs. Lungs:RLL crackles  Abdomen: obese,  nontender, distended. No hepatosplenomegaly. No bruits or masses. Good bowel  sounds. Extremities:RLE and LLE compression wraps in place.  Neuro: alert & orientedx3, cranial nerves grossly intact. Moves all 4 extremities w/o difficulty. Affect pleasant.     ASSESSMENT & PLAN:

## 2011-06-02 NOTE — Patient Instructions (Addendum)
Take torsemide 20 mg BID   [facility] to check BMET Monday  Follow up in two weeks.

## 2011-06-02 NOTE — Assessment & Plan Note (Addendum)
NYHA III. Volume status elevated. Progressive weight gain over the last two months. Weight increased 13 pounds now since UF. Will increase torsemide to 20  mg BID. Re-educated on limiting fluids to 2 liters per day. Will ask [facility] to check BMET on Monday. Follow up next week.   Patient seen and examined with Tonye Becket, NP. We discussed all aspects of the encounter. I agree with the assessment and plan as stated above. Her weight continues to fluctuate. I had a very frank talk with her about the need for improved compliance with her fluid intake. Agree with increasing demadex but will have to watch renal fx closely.

## 2011-06-03 ENCOUNTER — Telehealth (HOSPITAL_COMMUNITY): Payer: Self-pay | Admitting: *Deleted

## 2011-06-03 NOTE — Telephone Encounter (Signed)
Pt needs bmet drawn on Mon when her nurse goes out to wrap her legs.  Called and gave order to Maureen Ralphs at [facility] at [phone]

## 2011-06-11 NOTE — Progress Notes (Signed)
Patient seen and examined with Amy Clegg, NP. We discussed all aspects of the encounter. I agree with the assessment and plan as stated below. See my notes.   

## 2011-06-18 ENCOUNTER — Encounter (HOSPITAL_COMMUNITY): Payer: Medicare Other

## 2011-06-25 ENCOUNTER — Encounter (HOSPITAL_BASED_OUTPATIENT_CLINIC_OR_DEPARTMENT_OTHER): Payer: Medicare Other | Attending: Internal Medicine

## 2011-06-25 DIAGNOSIS — I5032 Chronic diastolic (congestive) heart failure: Secondary | ICD-10-CM | POA: Insufficient documentation

## 2011-06-25 DIAGNOSIS — I2789 Other specified pulmonary heart diseases: Secondary | ICD-10-CM | POA: Insufficient documentation

## 2011-06-25 DIAGNOSIS — N189 Chronic kidney disease, unspecified: Secondary | ICD-10-CM | POA: Insufficient documentation

## 2011-06-25 DIAGNOSIS — L97809 Non-pressure chronic ulcer of other part of unspecified lower leg with unspecified severity: Secondary | ICD-10-CM | POA: Insufficient documentation

## 2011-06-25 DIAGNOSIS — Z79899 Other long term (current) drug therapy: Secondary | ICD-10-CM | POA: Insufficient documentation

## 2011-06-25 DIAGNOSIS — I130 Hypertensive heart and chronic kidney disease with heart failure and stage 1 through stage 4 chronic kidney disease, or unspecified chronic kidney disease: Secondary | ICD-10-CM | POA: Insufficient documentation

## 2011-06-25 DIAGNOSIS — I739 Peripheral vascular disease, unspecified: Secondary | ICD-10-CM | POA: Insufficient documentation

## 2011-06-25 DIAGNOSIS — I872 Venous insufficiency (chronic) (peripheral): Secondary | ICD-10-CM | POA: Insufficient documentation

## 2011-06-25 DIAGNOSIS — Z794 Long term (current) use of insulin: Secondary | ICD-10-CM | POA: Insufficient documentation

## 2011-06-25 DIAGNOSIS — E119 Type 2 diabetes mellitus without complications: Secondary | ICD-10-CM | POA: Insufficient documentation

## 2011-06-25 DIAGNOSIS — E538 Deficiency of other specified B group vitamins: Secondary | ICD-10-CM | POA: Insufficient documentation

## 2011-06-25 DIAGNOSIS — F3289 Other specified depressive episodes: Secondary | ICD-10-CM | POA: Insufficient documentation

## 2011-06-25 DIAGNOSIS — F329 Major depressive disorder, single episode, unspecified: Secondary | ICD-10-CM | POA: Insufficient documentation

## 2011-06-26 ENCOUNTER — Ambulatory Visit (HOSPITAL_COMMUNITY)
Admission: RE | Admit: 2011-06-26 | Discharge: 2011-06-26 | Disposition: A | Discharge status: Home / Self Care | Payer: Medicare Other | Source: Ambulatory Visit | Attending: Internal Medicine | Admitting: Internal Medicine

## 2011-06-26 DIAGNOSIS — I509 Heart failure, unspecified: Secondary | ICD-10-CM

## 2011-06-26 DIAGNOSIS — I2789 Other specified pulmonary heart diseases: Secondary | ICD-10-CM | POA: Insufficient documentation

## 2011-06-26 DIAGNOSIS — I5032 Chronic diastolic (congestive) heart failure: Secondary | ICD-10-CM | POA: Insufficient documentation

## 2011-06-26 LAB — CBC
HCT: 32 % — ABNORMAL LOW (ref 36.0–46.0)
Hemoglobin: 10.1 g/dL — ABNORMAL LOW (ref 12.0–15.0)
MCV: 85.3 fL (ref 78.0–100.0)
Platelets: 158 10*3/uL (ref 150–400)
RBC: 3.75 MIL/uL — ABNORMAL LOW (ref 3.87–5.11)
WBC: 11.8 10*3/uL — ABNORMAL HIGH (ref 4.0–10.5)

## 2011-06-26 LAB — BASIC METABOLIC PANEL
CO2: 32 mEq/L (ref 19–32)
Calcium: 9.2 mg/dL (ref 8.4–10.5)
Chloride: 104 mEq/L (ref 96–112)
Potassium: 3.5 mEq/L (ref 3.5–5.1)
Sodium: 145 mEq/L (ref 135–145)

## 2011-06-26 NOTE — Patient Instructions (Signed)
Follow-up 6 weeks.

## 2011-06-30 NOTE — Progress Notes (Signed)
Patient ID: [name], female   DOB: 16-Mar-1946, 65 y.o.   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 Carotid stenosis 40-59% bilaterally.  Initially, she had an echocardiogram read as severe LV dysfunction with an EF of 15-20%. She underwent catheterization in 2008 that showed minimal nonobstructive coronary artery disease with EF 55%. She had mild pulmonary hypertension with both pulmonary venous and pulmonary arterial components. Her PVR was 5.1 Wood units. Echo in 3/09 EF 60%.  Saw Dr. Antoine Poche in 8/11 over question of possible AF. ECG with PACs. 24 monitor NSR with occ PACs and PVCS. No AF.    Pt was hospitalized at the end of August 2012 for acute on chronic diastolic heart failure.  Diuresed ~17 lbs with discharge weight of 282 lbs.  Switched from ACE-I to ARB due to cough.  She is here for follow up. Feels better. Weight at home has been 283-285. Denies SOB on exertion. Able to walk longer distances without dyspnea.  Denies CP/dizziness/ PND/Orthopnea. She is followed [facility] on a weekly basis. [facility] following every week for compression wraps. She continues on  Cipro/Doxycycline for cellulitis. MRI negative for osteo. Uses BiPAP at night. Complains of lower extremity edema. Limiting fluids. Follows low salt diet. Trying to lose weight.   ROS: All systems negative except as listed in HPI, PMH and Problem List.  Past Medical History  Diagnosis Date  . OSA (obstructive sleep apnea)   . Thrombophlebitis of deep vein of leg   . DM (diabetes mellitus)   . CHF (congestive heart failure)     hx of EF 15-20% in 4/08,  echo 3/09 EF 60-65%;   echo 6/12: EF 55-60%,  mild LAE  . Carotid stenosis     u/s 6/10: R 0-39%Ll 40-59%;  u/s 7/12: 40-59% bilateral (repeat in 7/13)  . HTN (hypertension)   . Morbid obesity   . Pulmonary HTN     mild,  cath 6/08 with PVR 5.1  . Hypokalemia   . 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 Spinal stenosis   . Obesity hypoventilation syndrome     Current Outpatient Prescriptions  Medication Sig Dispense Refill  . aspirin (BAYER ASPIRIN) 325 MG tablet Take 325 mg by mouth daily.        . carvedilol (COREG) 3.125 MG tablet Take 3.125 mg by mouth 2 (two) times daily with a meal.       . ciprofloxacin (CIPRO) 500 MG tablet Take 500 mg by mouth 2 (two) times daily.        . cyanocobalamin (,VITAMIN B-12,) 1000 MCG/ML injection Inject 1,000 mcg into the muscle every 30 (thirty) days.       Marland Kitchen doxycycline (DORYX) 100 MG DR capsule Take 100 mg by mouth 2 (two) times daily.        . ferrous sulfate 325 (65 FE) MG tablet Take 325 mg by mouth 2 (two) times daily.        . insulin glargine (LANTUS) 100 UNIT/ML injection Use as directed 42 UNITS in the morning and 80 units at night      . insulin glulisine (APIDRA) 100 UNIT/ML injection  Inject 10 Units into the skin 3 (three) times daily before meals.       Marland Kitchen LORazepam (ATIVAN) 1 MG tablet Take 1 mg by mouth 2 (two) times daily.        . naproxen sodium (ANAPROX) 220 MG tablet Take 220 mg by mouth as needed. As needed for leg pain       . sertraline (ZOLOFT) 100 MG tablet Take 1/2 tab by mouth once daily       . simvastatin (ZOCOR) 40 MG tablet TAKE 1 TABLET BY MOUTH DAILY  90 tablet  1  . spironolactone (ALDACTONE) 25 MG tablet Take 25 mg by mouth daily.        Marland Kitchen torsemide (DEMADEX) 20 MG tablet Take 20 mg by mouth 2 (two) times daily.       . Vitamin D, Ergocalciferol, (DRISDOL) 50000 UNITS CAPS Take by mouth. Take 2 capsules weekly          PHYSICAL EXAM: Filed Vitals:   06/26/11 1059  BP: 126/64  Pulse: 77  Wt 284  (294lbs)  General:  Well  appearing. No resp difficulty HEENT: normal Neck: supple. JVP difficult to assess appears to be 10-11 Carotids 2+ bilaterally; no bruits. No lymphadenopathy or thryomegaly appreciated. Cor: PMI normal. Regular rate & rhythm. No rubs, gallops or murmurs. Lungs:RLL crackles  Abdomen: obese,  nontender, distended. No hepatosplenomegaly. No bruits or masses. Good bowel sounds. Extremities:RLE and LLE compression wraps in place.  Neuro: alert & orientedx3, cranial nerves grossly intact. Moves all 4 extremities w/o difficulty. Affect pleasant.     ASSESSMENT & PLAN:

## 2011-06-30 NOTE — Assessment & Plan Note (Signed)
Doing well. Weight continues to decrease. Volume status looks good. Stressed need for vigilance in restricting fluid intake and also need to be more active. We also reviewed use of sliding scale diuretic regimen. Cellulitis appears to be healing very nicely. We will continue current regimen. Total time spent 25 minutes.

## 2011-07-07 ENCOUNTER — Ambulatory Visit: Payer: Medicare Other | Admitting: Pulmonary Disease

## 2011-07-23 ENCOUNTER — Encounter (HOSPITAL_BASED_OUTPATIENT_CLINIC_OR_DEPARTMENT_OTHER): Payer: Medicare Other | Attending: Internal Medicine

## 2011-07-23 DIAGNOSIS — L97309 Non-pressure chronic ulcer of unspecified ankle with unspecified severity: Secondary | ICD-10-CM | POA: Diagnosis not present

## 2011-07-23 DIAGNOSIS — I872 Venous insufficiency (chronic) (peripheral): Secondary | ICD-10-CM | POA: Insufficient documentation

## 2011-07-23 DIAGNOSIS — E119 Type 2 diabetes mellitus without complications: Secondary | ICD-10-CM | POA: Diagnosis not present

## 2011-07-23 DIAGNOSIS — Z794 Long term (current) use of insulin: Secondary | ICD-10-CM | POA: Diagnosis not present

## 2011-07-23 DIAGNOSIS — I509 Heart failure, unspecified: Secondary | ICD-10-CM | POA: Diagnosis not present

## 2011-07-23 DIAGNOSIS — Z79899 Other long term (current) drug therapy: Secondary | ICD-10-CM | POA: Insufficient documentation

## 2011-07-23 DIAGNOSIS — I10 Essential (primary) hypertension: Secondary | ICD-10-CM | POA: Insufficient documentation

## 2011-07-29 ENCOUNTER — Ambulatory Visit (INDEPENDENT_AMBULATORY_CARE_PROVIDER_SITE_OTHER): Payer: Medicare Other | Admitting: Pulmonary Disease

## 2011-07-29 ENCOUNTER — Encounter: Payer: Self-pay | Admitting: Pulmonary Disease

## 2011-07-29 DIAGNOSIS — G4733 Obstructive sleep apnea (adult) (pediatric): Secondary | ICD-10-CM

## 2011-07-29 DIAGNOSIS — E678 Other specified hyperalimentation: Secondary | ICD-10-CM

## 2011-07-29 NOTE — Patient Instructions (Signed)
Continue with weight loss Stay on bipap, and call if having issues with device followup with me in 6mos.

## 2011-07-29 NOTE — Assessment & Plan Note (Signed)
The patient has been compliant with her bilevel and oxygen, and feels that she sleeps well with the device.  She has lost significant weight since the last visit, and I've encouraged her to continue.  This is really her most important treatment modality at this time.

## 2011-07-29 NOTE — Progress Notes (Signed)
  Subjective:    Patient ID: [name], female    DOB: July 04, 1946, 66 y.o.   MRN: [id number]  HPI Patient comes in today for followup of her known obesity hypoventilation syndrome.  She also has chronic heart failure and is being diuresed effectively.  She has lost significant weight since her last visit, and I have congratulated her on this.  She is wearing her BiPAP compliantly, and is having no issues with the mask fit or pressure.  She feels that she sleeps well with the device, and has adequate daytime alertness.  She does have a little bit of a cough, but this sounds like she is having postnasal drip.  She has a history of reflux, but has stopped her proton pump inhibitor.  I have asked her to go back on this, and also try chlorpheniramine at bedtime.   Review of Systems  Constitutional: Negative for fever and unexpected weight change.  HENT: Positive for ear pain, sore throat and rhinorrhea. Negative for nosebleeds, congestion, sneezing, trouble swallowing, dental problem, postnasal drip and sinus pressure.   Eyes: Negative for redness and itching.  Respiratory: Positive for cough. Negative for chest tightness, shortness of breath and wheezing.   Cardiovascular: Positive for leg swelling. Negative for palpitations.  Gastrointestinal: Negative for nausea and vomiting.  Genitourinary: Negative for dysuria.  Musculoskeletal: Negative for joint swelling.  Skin: Negative for rash.  Neurological: Negative for headaches.  Hematological: Bruises/bleeds easily.  Psychiatric/Behavioral: Negative for dysphoric mood. The patient is not nervous/anxious.        Objective:   Physical Exam Obese female in no acute distress No skin breakdown or pressure necrosis from the CPAP mask Chest with a few basilar crackles, otherwise clear Heart exam with regular rate and rhythm Lower extremities with 2+ edema, no cyanosis Alert and oriented, does not appear to be sleepy, moves all 4  extremities.       Assessment & Plan:

## 2011-07-30 DIAGNOSIS — E119 Type 2 diabetes mellitus without complications: Secondary | ICD-10-CM | POA: Diagnosis not present

## 2011-07-30 DIAGNOSIS — L97309 Non-pressure chronic ulcer of unspecified ankle with unspecified severity: Secondary | ICD-10-CM | POA: Diagnosis not present

## 2011-07-30 DIAGNOSIS — I872 Venous insufficiency (chronic) (peripheral): Secondary | ICD-10-CM | POA: Diagnosis not present

## 2011-07-30 DIAGNOSIS — I509 Heart failure, unspecified: Secondary | ICD-10-CM | POA: Diagnosis not present

## 2011-08-04 ENCOUNTER — Ambulatory Visit (HOSPITAL_COMMUNITY)
Admission: RE | Admit: 2011-08-04 | Discharge: 2011-08-04 | Disposition: A | Discharge status: Home / Self Care | Payer: Medicare Other | Source: Ambulatory Visit | Attending: Internal Medicine | Admitting: Internal Medicine

## 2011-08-04 VITALS — BP 130/62 | HR 74 | Wt 270.2 lb

## 2011-08-04 DIAGNOSIS — E678 Other specified hyperalimentation: Secondary | ICD-10-CM

## 2011-08-04 DIAGNOSIS — I5032 Chronic diastolic (congestive) heart failure: Secondary | ICD-10-CM | POA: Diagnosis not present

## 2011-08-04 DIAGNOSIS — I509 Heart failure, unspecified: Secondary | ICD-10-CM | POA: Diagnosis not present

## 2011-08-04 DIAGNOSIS — E662 Morbid (severe) obesity with alveolar hypoventilation: Secondary | ICD-10-CM | POA: Insufficient documentation

## 2011-08-04 NOTE — Progress Notes (Signed)
Patient ID: [name], female   DOB: February 08, 1946, 66 y.o.   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 female with a history of morbid obesity complicated by obesity hypoventilation syndrome, hypertension, sleep apnea, diabetes, CRI and mild secondary pulmonary hypertension as well as hypokalemia. Carotid stenosis 40-59% bilaterally.  Initially, she had an echocardiogram read as severe LV dysfunction with an EF of 15-20%. She underwent catheterization in 2008 that showed minimal nonobstructive coronary artery disease with EF 55%. She had mild pulmonary hypertension with both pulmonary venous and pulmonary arterial components. Her PVR was 5.1 Wood units. Echo in 3/09 EF 60%.  Saw Dr. Antoine Poche in 8/11 over question of possible AF. ECG with PACs. 24 monitor NSR with occ PACs and PVCS. No AF.    Pt was hospitalized at the end of August 2012 for acute on chronic diastolic heart failure.  Diuresed ~17 lbs with discharge weight of 282 lbs.  Switched from ACE-I to ARB due to cough.  She returns for f/u today.  Weight continues to come down, down 14 lbs since last visit.  She is eating better and watching her fluids.  She is now ambulating as much as she can, her strength is coming back.  She is c/o Coughing and hoarseness.   Saw Dr. Shelle Iron last week for this and felt to be a cold.  Her LE wounds have healed well and she is now using compression hose and not wrapping her legs.  Continues to use Bipap at night.  Her husband is currently in rehab for a fall.     ROS: All systems negative except as listed in HPI, PMH and Problem List.  Past Medical History  Diagnosis Date  . OSA (obstructive sleep apnea)   . Thrombophlebitis of deep vein of leg   . DM (diabetes mellitus)   . CHF (congestive heart failure)     hx of EF 15-20% in 4/08,  echo 3/09 EF 60-65%;   echo 6/12: EF 55-60%, mild LAE  . Carotid  stenosis     u/s 6/10: R 0-39%Ll 40-59%;  u/s 7/12: 40-59% bilateral (repeat in 7/13)  . HTN (hypertension)   . Morbid obesity   . Pulmonary HTN     mild,  cath 6/08 with PVR 5.1  . Hypokalemia   . 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 Spinal stenosis   . Obesity hypoventilation syndrome     Current Outpatient Prescriptions on File Prior to Encounter  Medication Sig Dispense Refill  . aspirin (BAYER ASPIRIN) 325 MG tablet Take 325 mg by mouth daily.        . carvedilol (COREG) 3.125 MG tablet Take 3.125 mg by mouth 2 (two) times daily with a meal.       . cyanocobalamin (,VITAMIN B-12,) 1000 MCG/ML injection Inject 1,000 mcg into the muscle every 30 (thirty) days.       Marland Kitchen doxycycline (DORYX) 100 MG DR capsule Take 100 mg by mouth 2 (two) times daily.        . ferrous sulfate 325 (65 FE) MG tablet Take 325 mg by mouth 2 (two) times daily.        . insulin glargine (LANTUS) 100 UNIT/ML injection Use as directed 42 UNITS in the morning and 80 units at night      . insulin glulisine (  APIDRA) 100 UNIT/ML injection Inject 10 Units into the skin 3 (three) times daily before meals.       Marland Kitchen LORazepam (ATIVAN) 1 MG tablet Take 1 mg by mouth 2 (two) times daily.        . sertraline (ZOLOFT) 100 MG tablet Take 1/2 tab by mouth once daily       . simvastatin (ZOCOR) 40 MG tablet TAKE 1 TABLET BY MOUTH DAILY  90 tablet  1  . spironolactone (ALDACTONE) 25 MG tablet Take 25 mg by mouth daily.        Marland Kitchen torsemide (DEMADEX) 20 MG tablet Take 20 mg by mouth 2 (two) times daily.       . Vitamin D, Ergocalciferol, (DRISDOL) 50000 UNITS CAPS Take by mouth. Take 2 capsules weekly          PHYSICAL EXAM: Filed Vitals:   08/04/11 1142  BP: 130/62  Pulse: 74  Weight: 270 lb 4 oz (122.585 kg)  SpO2: 95%    General:  Well appearing. No resp difficulty HEENT: normal Neck: supple. JVP difficult to assess appears to be down Carotids 2+ bilaterally; no bruits. No lymphadenopathy or  thryomegaly appreciated. Cor: PMI normal. Regular rate & rhythm. No rubs, gallops or murmurs. Lungs: clear Abdomen: obese,  nontender, distended. No hepatosplenomegaly. No bruits or masses. Good bowel sounds. Extremities: no c/c. Tr-1+ edema. +compression stockings. Wounds healed Neuro: alert & orientedx3, cranial nerves grossly intact. Moves all 4 extremities w/o difficulty. Affect pleasant.     ASSESSMENT & PLAN:

## 2011-08-04 NOTE — Assessment & Plan Note (Signed)
Volume status is much improved! Congratulated her on her weight loss and diet efforts. Will continue current regimen.

## 2011-08-04 NOTE — Assessment & Plan Note (Signed)
Congratulated her on her weight loss and diet efforts. Urged her to continue.

## 2011-08-04 NOTE — Patient Instructions (Addendum)
Continue the GREAT work!!!!  Follow up with Dr. Gala Romney in 6-8 weeks.    Do the following things EVERYDAY: 1) Weigh yourself in the morning before breakfast. Write it down and keep it in a log. 2) Take your medicines as prescribed 3) Eat low salt foods-Limit salt (sodium) to 2000mg  per day.  4) Stay as active as you can everyday

## 2011-08-06 DIAGNOSIS — I10 Essential (primary) hypertension: Secondary | ICD-10-CM | POA: Diagnosis not present

## 2011-08-06 DIAGNOSIS — D518 Other vitamin B12 deficiency anemias: Secondary | ICD-10-CM | POA: Diagnosis not present

## 2011-08-06 DIAGNOSIS — M7040 Prepatellar bursitis, unspecified knee: Secondary | ICD-10-CM | POA: Diagnosis not present

## 2011-08-06 DIAGNOSIS — E109 Type 1 diabetes mellitus without complications: Secondary | ICD-10-CM | POA: Diagnosis not present

## 2011-08-20 DIAGNOSIS — M47817 Spondylosis without myelopathy or radiculopathy, lumbosacral region: Secondary | ICD-10-CM | POA: Diagnosis not present

## 2011-08-20 DIAGNOSIS — M5137 Other intervertebral disc degeneration, lumbosacral region: Secondary | ICD-10-CM | POA: Diagnosis not present

## 2011-08-27 ENCOUNTER — Encounter (HOSPITAL_BASED_OUTPATIENT_CLINIC_OR_DEPARTMENT_OTHER): Payer: Medicare Other

## 2011-08-31 DIAGNOSIS — I10 Essential (primary) hypertension: Secondary | ICD-10-CM | POA: Diagnosis not present

## 2011-08-31 DIAGNOSIS — G4736 Sleep related hypoventilation in conditions classified elsewhere: Secondary | ICD-10-CM | POA: Diagnosis not present

## 2011-08-31 DIAGNOSIS — D518 Other vitamin B12 deficiency anemias: Secondary | ICD-10-CM | POA: Diagnosis not present

## 2011-08-31 DIAGNOSIS — E109 Type 1 diabetes mellitus without complications: Secondary | ICD-10-CM | POA: Diagnosis not present

## 2011-09-08 ENCOUNTER — Other Ambulatory Visit: Payer: Self-pay | Admitting: Family Medicine

## 2011-09-08 DIAGNOSIS — Z1231 Encounter for screening mammogram for malignant neoplasm of breast: Secondary | ICD-10-CM

## 2011-09-14 DIAGNOSIS — N390 Urinary tract infection, site not specified: Secondary | ICD-10-CM | POA: Diagnosis not present

## 2011-09-21 ENCOUNTER — Ambulatory Visit (HOSPITAL_COMMUNITY)
Admission: RE | Admit: 2011-09-21 | Discharge: 2011-09-21 | Disposition: A | Discharge status: Home / Self Care | Payer: Medicare Other | Source: Ambulatory Visit | Attending: Internal Medicine | Admitting: Internal Medicine

## 2011-09-21 VITALS — BP 150/52 | HR 86 | Wt 266.5 lb

## 2011-09-21 DIAGNOSIS — I10 Essential (primary) hypertension: Secondary | ICD-10-CM

## 2011-09-21 DIAGNOSIS — N189 Chronic kidney disease, unspecified: Secondary | ICD-10-CM | POA: Diagnosis not present

## 2011-09-21 DIAGNOSIS — I129 Hypertensive chronic kidney disease with stage 1 through stage 4 chronic kidney disease, or unspecified chronic kidney disease: Secondary | ICD-10-CM | POA: Insufficient documentation

## 2011-09-21 DIAGNOSIS — Z7982 Long term (current) use of aspirin: Secondary | ICD-10-CM | POA: Diagnosis not present

## 2011-09-21 DIAGNOSIS — I5032 Chronic diastolic (congestive) heart failure: Secondary | ICD-10-CM | POA: Diagnosis not present

## 2011-09-21 DIAGNOSIS — E119 Type 2 diabetes mellitus without complications: Secondary | ICD-10-CM | POA: Diagnosis not present

## 2011-09-21 DIAGNOSIS — I2789 Other specified pulmonary heart diseases: Secondary | ICD-10-CM | POA: Diagnosis not present

## 2011-09-21 DIAGNOSIS — G4733 Obstructive sleep apnea (adult) (pediatric): Secondary | ICD-10-CM | POA: Insufficient documentation

## 2011-09-21 DIAGNOSIS — Z794 Long term (current) use of insulin: Secondary | ICD-10-CM | POA: Diagnosis not present

## 2011-09-21 DIAGNOSIS — E876 Hypokalemia: Secondary | ICD-10-CM | POA: Diagnosis not present

## 2011-09-21 DIAGNOSIS — I509 Heart failure, unspecified: Secondary | ICD-10-CM | POA: Insufficient documentation

## 2011-09-21 DIAGNOSIS — I6529 Occlusion and stenosis of unspecified carotid artery: Secondary | ICD-10-CM | POA: Diagnosis not present

## 2011-09-21 MED ORDER — AMLODIPINE BESYLATE 2.5 MG PO TABS: 2.5000 mg | ORAL_TABLET | Freq: Every day | ORAL | Status: DC

## 2011-09-21 NOTE — Progress Notes (Signed)
Patient ID: [name], female   DOB: 1945/10/31, 66 y.o.   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 Carotid stenosis 40-59% bilaterally.  Initially, she had an echocardiogram read as severe LV dysfunction with an EF of 15-20%. She underwent catheterization in 2008 that showed minimal nonobstructive coronary artery disease with EF 55%. She had mild pulmonary hypertension with both pulmonary venous and pulmonary arterial components. Her PVR was 5.1 Wood units. Echo in 3/09 EF 60%.  Saw Dr. Antoine Poche in 8/11 over question of possible AF. ECG with PACs. 24 monitor NSR with occ PACs and PVCS. No AF.    Pt was hospitalized at the end of August 2012 for acute on chronic diastolic heart failure.  Diuresed ~17 lbs with discharge weight of 282 lbs.  Switched from ACE-I to ARB due to cough.  She returns for f/u today.  Denies SOB/PND/Orthopnea. Weight down at home 33 pounds. Continues to use BI-PAP and oxygen at night. Walking at least 3-4 days. Decreased lower extremity edema.   ROS: All systems negative except as listed in HPI, PMH and Problem List.  Past Medical History  Diagnosis Date  . OSA (obstructive sleep apnea)   . Thrombophlebitis of deep vein of leg   . DM (diabetes mellitus)   . CHF (congestive heart failure)     hx of EF 15-20% in 4/08,  echo 3/09 EF 60-65%;   echo 6/12: EF 55-60%, mild LAE  . Carotid stenosis     u/s 6/10: R 0-39%Ll 40-59%;  u/s 7/12: 40-59% bilateral (repeat in 7/13)  . HTN (hypertension)   . Morbid obesity   . Pulmonary HTN     mild,  cath 6/08 with PVR 5.1  . Hypokalemia   . 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 Spinal stenosis   .  Obesity hypoventilation syndrome     Current Outpatient Prescriptions on File Prior to Encounter  Medication Sig Dispense Refill  . aspirin (BAYER ASPIRIN) 325 MG tablet Take 325 mg by mouth daily.        . carvedilol (COREG) 3.125 MG tablet Take 3.125 mg by mouth 2 (two) times daily with a meal.       . cyanocobalamin (,VITAMIN B-12,) 1000 MCG/ML injection Inject 1,000 mcg into the muscle every 30 (thirty) days.       . insulin glargine (LANTUS) 100 UNIT/ML injection Use as directed 42 UNITS in the morning and 80 units at night      . insulin glulisine (APIDRA) 100 UNIT/ML injection Inject 10 Units into the skin 3 (three) times daily before meals.       Marland Kitchen LORazepam (ATIVAN) 1 MG tablet Take 1 mg by mouth 2 (two) times daily.        . sertraline (ZOLOFT) 100 MG tablet Take 1/2 tab by mouth once daily       . simvastatin (ZOCOR) 40 MG tablet TAKE 1 TABLET BY MOUTH DAILY  90 tablet  1  . spironolactone (ALDACTONE) 25 MG tablet Take 25 mg by mouth daily.        Marland Kitchen torsemide (DEMADEX) 20 MG tablet Take 20 mg by mouth 2 (two) times daily.       Marland Kitchen  Vitamin D, Ergocalciferol, (DRISDOL) 50000 UNITS CAPS Take 50,000 Units by mouth 2 (two) times a week. Take on Mon and Thurs.         PHYSICAL EXAM: Filed Vitals:   09/21/11 1003  BP: 150/52  Pulse: 86  Weight: 266 lb 8 oz (120.884 kg)  SpO2: 92%    General:  Well appearing. No resp difficulty HEENT: normal Neck: supple. JVP 6-7  Carotids 2+ bilaterally; no bruits. No lymphadenopathy or thryomegaly appreciated. Cor: PMI normal. Regular rate & rhythm. No rubs, gallops or murmurs. Lungs: clear Abdomen: obese,  nontender, distended. No hepatosplenomegaly. No bruits or masses. Good bowel sounds. Extremities: tr edema. No cyanosis or clubbing Neuro: alert & orientedx3, cranial nerves grossly intact. Moves all 4 extremities w/o difficulty. Affect pleasant.     ASSESSMENT & PLAN:

## 2011-09-21 NOTE — Patient Instructions (Addendum)
We will contact you in 3 months to schedule your next appointment.  Do the following things EVERYDAY: 1) Weigh yourself in the morning before breakfast. Write it down and keep it in a log. 2) Take your medicines as prescribed 3) Eat low salt foods--Limit salt (sodium) to 2000mg  per day.  4) Stay as active as you can everyday

## 2011-09-21 NOTE — Assessment & Plan Note (Addendum)
SBP elevated. Offered Norvasc 2.5 mg daily however she declines at this time as she says she is under a lot of stress at home. She will follow BPs and f/u with [place].

## 2011-09-21 NOTE — Assessment & Plan Note (Addendum)
Volume status looks great.. She continues to to lose weight - now down 30 pounds. Continue current medications. Reinforced need for daily weights and reviewed use of sliding scale diuretics.

## 2011-09-28 DIAGNOSIS — N3020 Other chronic cystitis without hematuria: Secondary | ICD-10-CM | POA: Diagnosis not present

## 2011-09-28 DIAGNOSIS — N3000 Acute cystitis without hematuria: Secondary | ICD-10-CM | POA: Diagnosis not present

## 2011-10-07 ENCOUNTER — Ambulatory Visit
Admission: RE | Admit: 2011-10-07 | Discharge: 2011-10-07 | Disposition: A | Discharge status: Home / Self Care | Payer: Medicare Other | Source: Ambulatory Visit | Attending: Family Medicine | Admitting: Family Medicine

## 2011-10-07 DIAGNOSIS — Z1231 Encounter for screening mammogram for malignant neoplasm of breast: Secondary | ICD-10-CM

## 2011-10-14 DIAGNOSIS — G4733 Obstructive sleep apnea (adult) (pediatric): Secondary | ICD-10-CM | POA: Diagnosis not present

## 2011-10-15 ENCOUNTER — Telehealth: Payer: Self-pay | Admitting: Internal Medicine

## 2011-10-15 NOTE — Telephone Encounter (Signed)
Received 2 pages. Faxed to Dr. Gala Romney at [facility] [place] 10/15/11.

## 2011-10-26 ENCOUNTER — Other Ambulatory Visit: Payer: Self-pay

## 2011-10-26 DIAGNOSIS — N3000 Acute cystitis without hematuria: Secondary | ICD-10-CM | POA: Diagnosis not present

## 2011-10-26 DIAGNOSIS — N3020 Other chronic cystitis without hematuria: Secondary | ICD-10-CM | POA: Diagnosis not present

## 2011-10-28 ENCOUNTER — Other Ambulatory Visit: Payer: Self-pay | Admitting: Internal Medicine

## 2011-10-28 MED ORDER — TORSEMIDE 20 MG PO TABS: 20.0000 mg | ORAL_TABLET | Freq: Two times a day (BID) | ORAL | Status: DC

## 2011-10-28 MED ORDER — SIMVASTATIN 40 MG PO TABS: 40.0000 mg | ORAL_TABLET | Freq: Every day | ORAL | Status: DC

## 2011-11-05 DIAGNOSIS — N3090 Cystitis, unspecified without hematuria: Secondary | ICD-10-CM | POA: Diagnosis not present

## 2011-11-05 DIAGNOSIS — N3020 Other chronic cystitis without hematuria: Secondary | ICD-10-CM | POA: Diagnosis not present

## 2011-11-05 DIAGNOSIS — R3129 Other microscopic hematuria: Secondary | ICD-10-CM | POA: Diagnosis not present

## 2011-11-10 ENCOUNTER — Other Ambulatory Visit: Payer: Self-pay | Admitting: Pulmonary Disease

## 2011-11-10 DIAGNOSIS — E678 Other specified hyperalimentation: Secondary | ICD-10-CM

## 2011-11-17 DIAGNOSIS — N3020 Other chronic cystitis without hematuria: Secondary | ICD-10-CM | POA: Diagnosis not present

## 2011-11-23 DIAGNOSIS — Z961 Presence of intraocular lens: Secondary | ICD-10-CM | POA: Diagnosis not present

## 2011-11-23 DIAGNOSIS — E119 Type 2 diabetes mellitus without complications: Secondary | ICD-10-CM | POA: Diagnosis not present

## 2011-12-21 ENCOUNTER — Encounter (HOSPITAL_COMMUNITY): Payer: Medicare Other

## 2011-12-25 DIAGNOSIS — M9981 Other biomechanical lesions of cervical region: Secondary | ICD-10-CM | POA: Diagnosis not present

## 2011-12-25 DIAGNOSIS — M542 Cervicalgia: Secondary | ICD-10-CM | POA: Diagnosis not present

## 2011-12-25 DIAGNOSIS — M546 Pain in thoracic spine: Secondary | ICD-10-CM | POA: Diagnosis not present

## 2011-12-25 DIAGNOSIS — M999 Biomechanical lesion, unspecified: Secondary | ICD-10-CM | POA: Diagnosis not present

## 2011-12-28 DIAGNOSIS — M9981 Other biomechanical lesions of cervical region: Secondary | ICD-10-CM | POA: Diagnosis not present

## 2011-12-28 DIAGNOSIS — M542 Cervicalgia: Secondary | ICD-10-CM | POA: Diagnosis not present

## 2011-12-28 DIAGNOSIS — M999 Biomechanical lesion, unspecified: Secondary | ICD-10-CM | POA: Diagnosis not present

## 2011-12-28 DIAGNOSIS — M546 Pain in thoracic spine: Secondary | ICD-10-CM | POA: Diagnosis not present

## 2011-12-29 ENCOUNTER — Other Ambulatory Visit: Payer: Self-pay | Admitting: Internal Medicine

## 2011-12-29 MED ORDER — SIMVASTATIN 40 MG PO TABS: 40.0000 mg | ORAL_TABLET | Freq: Every day | ORAL | Status: DC

## 2011-12-30 DIAGNOSIS — M9981 Other biomechanical lesions of cervical region: Secondary | ICD-10-CM | POA: Diagnosis not present

## 2011-12-30 DIAGNOSIS — M546 Pain in thoracic spine: Secondary | ICD-10-CM | POA: Diagnosis not present

## 2011-12-30 DIAGNOSIS — M999 Biomechanical lesion, unspecified: Secondary | ICD-10-CM | POA: Diagnosis not present

## 2011-12-30 DIAGNOSIS — M542 Cervicalgia: Secondary | ICD-10-CM | POA: Diagnosis not present

## 2012-01-07 DIAGNOSIS — E669 Obesity, unspecified: Secondary | ICD-10-CM | POA: Diagnosis not present

## 2012-01-07 DIAGNOSIS — M25569 Pain in unspecified knee: Secondary | ICD-10-CM | POA: Diagnosis not present

## 2012-01-07 DIAGNOSIS — M48061 Spinal stenosis, lumbar region without neurogenic claudication: Secondary | ICD-10-CM | POA: Diagnosis not present

## 2012-01-07 DIAGNOSIS — M1710 Unilateral primary osteoarthritis, unspecified knee: Secondary | ICD-10-CM | POA: Diagnosis not present

## 2012-01-12 ENCOUNTER — Telehealth (HOSPITAL_COMMUNITY): Payer: Self-pay | Admitting: *Deleted

## 2012-01-12 MED ORDER — SIMVASTATIN 40 MG PO TABS: 40.0000 mg | ORAL_TABLET | Freq: Every day | ORAL | Status: DC

## 2012-01-12 NOTE — Telephone Encounter (Signed)
[name] called today. She needs a refill on simvastatin called into her pharmacy asap, she is out of it completely and there was a problem at the pharmacy last week and it was not reordered. Thanks.

## 2012-01-12 NOTE — Telephone Encounter (Signed)
Pt aware prescription was sent in 

## 2012-01-20 DIAGNOSIS — M5137 Other intervertebral disc degeneration, lumbosacral region: Secondary | ICD-10-CM | POA: Diagnosis not present

## 2012-01-20 DIAGNOSIS — M47817 Spondylosis without myelopathy or radiculopathy, lumbosacral region: Secondary | ICD-10-CM | POA: Diagnosis not present

## 2012-01-27 ENCOUNTER — Ambulatory Visit: Payer: Medicare Other | Admitting: Pulmonary Disease

## 2012-02-04 ENCOUNTER — Encounter: Payer: Self-pay | Admitting: Pulmonary Disease

## 2012-02-04 ENCOUNTER — Ambulatory Visit (INDEPENDENT_AMBULATORY_CARE_PROVIDER_SITE_OTHER): Payer: Medicare Other | Admitting: Pulmonary Disease

## 2012-02-04 VITALS — BP 120/76 | HR 59 | Temp 98.0°F | Ht 63.0 in | Wt 269.6 lb

## 2012-02-04 DIAGNOSIS — E678 Other specified hyperalimentation: Secondary | ICD-10-CM

## 2012-02-04 NOTE — Assessment & Plan Note (Signed)
The patient is doing well on her current BiPAP settings, he is having no issues with tolerance.  She is sleeping well with the device, is satisfied with her daytime alertness, and currently her fluid balance is under control.  I've asked her to continue with her BiPAP device at night, and to continue working on weight loss.

## 2012-02-04 NOTE — Progress Notes (Signed)
  Subjective:    Patient ID: [name], female    DOB: 06-09-1946, 66 y.o.   MRN: [id number]  HPI The patient comes in today for followup of her known obesity hypoventilation syndrome.  She is wearing her bilevel compliantly, and is having no significant issue with her mask fit or pressure.  She feels that she is sleeping well, and has excellent daytime alertness.  She has been keeping her fluid balance under good control, and her weight is actually down 5 pounds from last visit.   Review of Systems  Constitutional: Negative for fever and unexpected weight change.  HENT: Negative for ear pain, nosebleeds, congestion, sore throat, rhinorrhea, sneezing, trouble swallowing, dental problem, postnasal drip and sinus pressure.   Eyes: Negative for redness and itching.  Respiratory: Positive for cough. Negative for chest tightness, shortness of breath and wheezing.   Cardiovascular: Negative for palpitations and leg swelling.  Gastrointestinal: Negative for nausea and vomiting.  Genitourinary: Positive for dysuria.  Musculoskeletal: Positive for arthralgias. Negative for joint swelling.  Skin: Negative for rash.  Neurological: Negative for headaches.  Hematological: Does not bruise/bleed easily.  Psychiatric/Behavioral: Negative for dysphoric mood. The patient is not nervous/anxious.   All other systems reviewed and are negative.       Objective:   Physical Exam Obese female in no acute distress Nose without purulence or discharge noted No skin breakdown or pressure necrosis from the CPAP mask Lungs are clear to auscultation with no wheezing Cardiac exam was regular rate and rhythm Lower extremities with minimal edema, no cyanosis alert and oriented, does not appear to be sleepy, moves all 4 extremities.       Assessment & Plan:

## 2012-02-04 NOTE — Patient Instructions (Addendum)
Stay on bipap, and adjust humidity as needed. Keep up with mask changes and supplies. Work on weight loss followup with me in 6mos.

## 2012-02-15 ENCOUNTER — Encounter (HOSPITAL_COMMUNITY): Payer: Self-pay

## 2012-02-15 ENCOUNTER — Ambulatory Visit (HOSPITAL_COMMUNITY)
Admission: RE | Admit: 2012-02-15 | Discharge: 2012-02-15 | Disposition: A | Discharge status: Home / Self Care | Payer: Medicare Other | Source: Ambulatory Visit | Attending: Internal Medicine | Admitting: Internal Medicine

## 2012-02-15 VITALS — BP 122/62 | HR 69 | Ht 63.0 in | Wt 276.1 lb

## 2012-02-15 DIAGNOSIS — I5032 Chronic diastolic (congestive) heart failure: Secondary | ICD-10-CM

## 2012-02-15 LAB — BASIC METABOLIC PANEL
CO2: 31 mEq/L (ref 19–32)
Chloride: 103 mEq/L (ref 96–112)
Creatinine, Ser: 1.12 mg/dL — ABNORMAL HIGH (ref 0.50–1.10)

## 2012-02-15 NOTE — Progress Notes (Signed)
HPI:  Primary Cardiologist:  Dr. Kathe Mariner Hrivnak is a 66 y.o. female with a history of morbid obesity complicated by obesity hypoventilation syndrome, hypertension, sleep apnea, diabetes, CRI and mild secondary pulmonary hypertension as well as hypokalemia. Carotid stenosis 40-59% bilaterally.  Initially, she had an echocardiogram read as severe LV dysfunction with an EF of 15-20%. She underwent catheterization in 2008 that showed minimal nonobstructive coronary artery disease with EF 55%. She had mild pulmonary hypertension with both pulmonary venous and pulmonary arterial components. Her PVR was 5.1 Wood units. Echo in 3/09 EF 60%.  Saw Dr. Antoine Poche in 8/11 over question of possible AF. ECG with PACs. 24 monitor NSR with occ PACs and PVCS. No AF.    Pt was hospitalized at the end of August 2012 for acute on chronic diastolic heart failure.  Diuresed ~17 lbs with discharge weight of 282 lbs.  Switched from ACE-I to ARB due to cough.  She returns for f/u today.  She feels pretty good today.  Weight going up at home from 265-271 pounds.  No SOB/orthopnea. Dr. Shelle Iron took her off her O2 in the spring, doing well without it.  Still wears Bi-pap at night.  Lower extremity ankle edema.  Walking 15-20 minutes several times a day.  When she stands up she looses her balance.  Larey Seat last week because she lost her balance.     ROS: All systems negative except as listed in HPI, PMH and Problem List.  Past Medical History  Diagnosis Date  . OSA (obstructive sleep apnea)   . Thrombophlebitis of deep vein of leg   . DM (diabetes mellitus)   . CHF (congestive heart failure)     hx of EF 15-20% in 4/08,  echo 3/09 EF 60-65%;   echo 6/12: EF 55-60%, mild LAE  . Carotid stenosis     u/s 6/10: R 0-39%Ll 40-59%;  u/s 7/12: 40-59% bilateral (repeat in 7/13)  . HTN (hypertension)   . Morbid obesity   . Pulmonary HTN     mild,  cath 6/08 with PVR 5.1  . Hypokalemia   . 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 Spinal stenosis   . Obesity hypoventilation syndrome      Current Outpatient Prescriptions on File Prior to Encounter  Medication Sig Dispense Refill  . aspirin (BAYER ASPIRIN) 325 MG tablet Take 325 mg by mouth daily.        . carvedilol (COREG) 3.125 MG tablet Take 3.125 mg by mouth 2 (two) times daily with a meal.       . cyanocobalamin (,VITAMIN B-12,) 1000 MCG/ML injection Inject 1,000 mcg into the muscle every 30 (thirty) days.       . insulin glargine (LANTUS) 100 UNIT/ML injection Use as directed 42 UNITS in the morning and 80 units at night      . insulin glulisine (APIDRA) 100 UNIT/ML injection Inject 10 Units into the skin 3 (three) times daily before meals.       Marland Kitchen LORazepam (ATIVAN) 1 MG tablet Take 1 mg by mouth 2 (two) times daily.        . sertraline (ZOLOFT) 100 MG tablet Take 1/2 tab by mouth once daily       . simvastatin (ZOCOR) 40 MG tablet Take 1 tablet (40 mg total) by mouth at bedtime.  90 tablet  3  . spironolactone (ALDACTONE) 25 MG tablet Take 25 mg by mouth daily.        Marland Kitchen  torsemide (DEMADEX) 20 MG tablet Take 20 mg by mouth daily.      . Vitamin D, Ergocalciferol, (DRISDOL) 50000 UNITS CAPS Take 50,000 Units by mouth 2 (two) times a week. Take on Mon and Thurs.       Allergies  Allergen Reactions  . Codeine   . Meperidine Hcl     Increase in heart rate  . Metformin      PHYSICAL EXAM: Filed Vitals:   02/15/12 1003  BP: 122/62  Pulse: 69  Height: 5\' 3"  (1.6 m)  Weight: 276 lb 1.9 oz (125.247 kg)    General:  Well appearing. No resp difficulty HEENT: normal Neck: supple. JVP 8-9.  Carotids 2+ bilaterally; no bruits. No lymphadenopathy or thryomegaly appreciated. Cor: PMI normal. Regular rate & rhythm. No rubs, gallops or murmurs. Lungs: clear Abdomen: obese,  nontender, distended. No hepatosplenomegaly. No bruits or masses. Good bowel sounds. Extremities: tr edema. No cyanosis or clubbing Neuro: alert &  orientedx3, cranial nerves grossly intact. Moves all 4 extremities w/o difficulty. Affect pleasant.     ASSESSMENT & PLAN:

## 2012-02-15 NOTE — Assessment & Plan Note (Signed)
Doing relatively well but volume status up about 6-7 pounds. Will have her double demadex for 2-4 days. Reinforced need for daily weights and reviewed use of sliding scale diuretics. Will likely need low-dose potassium. Check labs today. Will check ANA to exclude leukocytoclastic vasculitis as cause of rash.

## 2012-02-16 LAB — ANA: Anti Nuclear Antibody(ANA): NEGATIVE

## 2012-02-22 DIAGNOSIS — I10 Essential (primary) hypertension: Secondary | ICD-10-CM | POA: Diagnosis not present

## 2012-02-22 DIAGNOSIS — N189 Chronic kidney disease, unspecified: Secondary | ICD-10-CM | POA: Diagnosis not present

## 2012-02-23 DIAGNOSIS — N390 Urinary tract infection, site not specified: Secondary | ICD-10-CM | POA: Diagnosis not present

## 2012-03-17 ENCOUNTER — Other Ambulatory Visit (HOSPITAL_BASED_OUTPATIENT_CLINIC_OR_DEPARTMENT_OTHER): Payer: Self-pay | Admitting: Internal Medicine

## 2012-03-17 ENCOUNTER — Encounter (HOSPITAL_BASED_OUTPATIENT_CLINIC_OR_DEPARTMENT_OTHER): Payer: Medicare Other | Attending: Internal Medicine

## 2012-03-17 ENCOUNTER — Ambulatory Visit (HOSPITAL_COMMUNITY)
Admission: RE | Admit: 2012-03-17 | Discharge: 2012-03-17 | Disposition: A | Discharge status: Home / Self Care | Payer: Medicare Other | Source: Ambulatory Visit | Attending: Internal Medicine | Admitting: Internal Medicine

## 2012-03-17 DIAGNOSIS — M79676 Pain in unspecified toe(s): Secondary | ICD-10-CM

## 2012-03-17 DIAGNOSIS — S91109A Unspecified open wound of unspecified toe(s) without damage to nail, initial encounter: Secondary | ICD-10-CM | POA: Diagnosis not present

## 2012-03-17 DIAGNOSIS — E1169 Type 2 diabetes mellitus with other specified complication: Secondary | ICD-10-CM | POA: Insufficient documentation

## 2012-03-17 DIAGNOSIS — T07XXXA Unspecified multiple injuries, initial encounter: Secondary | ICD-10-CM | POA: Diagnosis not present

## 2012-03-17 DIAGNOSIS — L97509 Non-pressure chronic ulcer of other part of unspecified foot with unspecified severity: Secondary | ICD-10-CM | POA: Insufficient documentation

## 2012-03-17 DIAGNOSIS — X58XXXA Exposure to other specified factors, initial encounter: Secondary | ICD-10-CM | POA: Insufficient documentation

## 2012-03-17 DIAGNOSIS — Z794 Long term (current) use of insulin: Secondary | ICD-10-CM | POA: Diagnosis not present

## 2012-03-17 DIAGNOSIS — M869 Osteomyelitis, unspecified: Secondary | ICD-10-CM | POA: Diagnosis not present

## 2012-03-17 NOTE — Progress Notes (Signed)
[facility]  NAME:  [name], SHUTTERS NO.:  [id number]  MEDICAL RECORD NO.:  [id number]      DATE OF BIRTH:  02/12/1946  PHYSICIAN:  Maxwell Caul, M.D. VISIT DATE:  03/17/2012                                  OFFICE VISIT   LOCATION:  [facility].  [name] is now 66 year old woman that we actually cared for in the late part of 2012 until January 2013.  At that point in time, she had presented with wounds on her bilateral legs.  She is a type 2 diabetic. Vascular studies done on May 25, 2011, showed triphasic waveforms bilaterally with reasonably good ABIs on the right 1.09, on the left 1.17.  She had an MRI which did not demonstrate osteomyelitis. Ultimately, we were able to get the wounds to close over.  The wounds are mostly in her legs and ankle at that time as well as her right second toe and right lateral foot.  She tells me that she was reasonably well up until about 6 weeks ago. She noted a "blood blister" on the plantar aspect of her right 1st toe. Eventually this opened.  She has been using Silvadene cream on this and wrapping at.  This, however, has not closed over.  She has not been on antibiotics.  I am not aware that there has been any cultures of this wound.  PAST MEDICAL HISTORY: 1. Diastolically-mediated heart failure with a previous echocardiogram     that does show good left ventricular function.  Her right     ventricular function was also adequate. 2. History of pulmonary hypertension. 3. Cardiorenal syndrome. 4. Obesity hypoventilation syndrome. 5. Anemia. 6. Type 2 diabetes, on Lantus. 7. Depression. 8. Vitamin B12 deficiency. 9. Colectomy in year 2000 for cancer. 10.Appendectomy, hysterectomy and cholecystectomy.  PHYSICAL EXAMINATION:  VITAL SIGNS:  Temperature is 98.5, pulse 78, respirations 18, blood pressure is elevated at 189/73. LUNGS:  Respiratory shallow but otherwise clear  air entry. CARDIAC:  Heart sounds are normal.  There are no murmurs. NECK:  JVP was not obviously elevated.  There was no S3 and no other signs of congestive heart failure. EXTREMITIES:  She has venous stasis, some edema.  Peripheral pulses were palpable.  Her ABI on the left was 0.96.  WOUND EXAM:  This was on the plantar aspect of her left great toe. Granulation here looked reasonably healthy, however there was thick circumferential callus.  This was manually debrided.  Underneath the callus with some nonvitalized subcutaneous tissue, this was also removed.  Afterwards, the wound cleaned up quite nicely.  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  We applied silver alginate, Kerlix and a toe sock. She will have a CBC and diff sedimentation rate and a plain x-ray of the plantar surface of the toe.  We will see her back in a week's time.          ______________________________ Maxwell Caul, M.D.     MGR/MEDQ  D:  03/17/2012  T:  03/17/2012  Job:  [id number]

## 2012-03-23 ENCOUNTER — Encounter (HOSPITAL_BASED_OUTPATIENT_CLINIC_OR_DEPARTMENT_OTHER): Payer: Medicare Other | Attending: Internal Medicine

## 2012-03-23 DIAGNOSIS — M869 Osteomyelitis, unspecified: Secondary | ICD-10-CM | POA: Insufficient documentation

## 2012-03-23 DIAGNOSIS — L97509 Non-pressure chronic ulcer of other part of unspecified foot with unspecified severity: Secondary | ICD-10-CM | POA: Insufficient documentation

## 2012-03-23 DIAGNOSIS — E1169 Type 2 diabetes mellitus with other specified complication: Secondary | ICD-10-CM | POA: Insufficient documentation

## 2012-03-24 ENCOUNTER — Telehealth: Payer: Self-pay | Admitting: Family Medicine

## 2012-03-24 ENCOUNTER — Ambulatory Visit (HOSPITAL_COMMUNITY)
Admission: RE | Admit: 2012-03-24 | Discharge: 2012-03-24 | Disposition: A | Discharge status: Home / Self Care | Payer: Medicare Other | Source: Ambulatory Visit | Attending: Internal Medicine | Admitting: Internal Medicine

## 2012-03-24 ENCOUNTER — Other Ambulatory Visit (HOSPITAL_BASED_OUTPATIENT_CLINIC_OR_DEPARTMENT_OTHER): Payer: Self-pay | Admitting: Internal Medicine

## 2012-03-24 DIAGNOSIS — T148XXA Other injury of unspecified body region, initial encounter: Secondary | ICD-10-CM | POA: Diagnosis not present

## 2012-03-24 DIAGNOSIS — X58XXXA Exposure to other specified factors, initial encounter: Secondary | ICD-10-CM | POA: Insufficient documentation

## 2012-03-24 DIAGNOSIS — I509 Heart failure, unspecified: Secondary | ICD-10-CM | POA: Diagnosis not present

## 2012-03-24 DIAGNOSIS — R0989 Other specified symptoms and signs involving the circulatory and respiratory systems: Secondary | ICD-10-CM | POA: Diagnosis not present

## 2012-03-24 DIAGNOSIS — T8189XA Other complications of procedures, not elsewhere classified, initial encounter: Secondary | ICD-10-CM | POA: Diagnosis not present

## 2012-03-24 DIAGNOSIS — E119 Type 2 diabetes mellitus without complications: Secondary | ICD-10-CM | POA: Diagnosis not present

## 2012-03-24 DIAGNOSIS — L97509 Non-pressure chronic ulcer of other part of unspecified foot with unspecified severity: Secondary | ICD-10-CM | POA: Diagnosis not present

## 2012-03-24 DIAGNOSIS — I10 Essential (primary) hypertension: Secondary | ICD-10-CM | POA: Insufficient documentation

## 2012-03-24 DIAGNOSIS — C189 Malignant neoplasm of colon, unspecified: Secondary | ICD-10-CM | POA: Insufficient documentation

## 2012-03-24 DIAGNOSIS — R0602 Shortness of breath: Secondary | ICD-10-CM | POA: Diagnosis not present

## 2012-03-24 DIAGNOSIS — M869 Osteomyelitis, unspecified: Secondary | ICD-10-CM | POA: Diagnosis not present

## 2012-03-24 DIAGNOSIS — E1169 Type 2 diabetes mellitus with other specified complication: Secondary | ICD-10-CM | POA: Diagnosis not present

## 2012-03-24 NOTE — Telephone Encounter (Signed)
New Problem:    I spoke with Lawanna Kobus and before this patient will be able to have an EKG on the date she is scheduled to have her ECHO here, she would need to receive an ok form the DOD that day.  Please let me know if Dr. [place] has given the ok for that so I can call the patient and let them know.

## 2012-03-24 NOTE — Telephone Encounter (Signed)
Nurse room visit scheduled for EKG same day as ECHO Mylo Red RN

## 2012-03-30 ENCOUNTER — Encounter: Payer: Self-pay | Admitting: *Deleted

## 2012-03-30 ENCOUNTER — Other Ambulatory Visit: Payer: Self-pay | Admitting: *Deleted

## 2012-03-30 ENCOUNTER — Other Ambulatory Visit (HOSPITAL_COMMUNITY): Payer: Self-pay | Admitting: Internal Medicine

## 2012-03-30 ENCOUNTER — Ambulatory Visit (HOSPITAL_COMMUNITY): Payer: Medicare Other | Attending: Cardiology | Admitting: Radiology

## 2012-03-30 ENCOUNTER — Ambulatory Visit (INDEPENDENT_AMBULATORY_CARE_PROVIDER_SITE_OTHER): Payer: Medicare Other | Admitting: *Deleted

## 2012-03-30 ENCOUNTER — Telehealth: Payer: Self-pay | Admitting: *Deleted

## 2012-03-30 ENCOUNTER — Encounter (HOSPITAL_COMMUNITY): Payer: Self-pay

## 2012-03-30 ENCOUNTER — Encounter (HOSPITAL_COMMUNITY): Payer: Self-pay | Admitting: Internal Medicine

## 2012-03-30 DIAGNOSIS — I509 Heart failure, unspecified: Secondary | ICD-10-CM

## 2012-03-30 DIAGNOSIS — E119 Type 2 diabetes mellitus without complications: Secondary | ICD-10-CM | POA: Insufficient documentation

## 2012-03-30 DIAGNOSIS — R0989 Other specified symptoms and signs involving the circulatory and respiratory systems: Secondary | ICD-10-CM

## 2012-03-30 DIAGNOSIS — E662 Morbid (severe) obesity with alveolar hypoventilation: Secondary | ICD-10-CM | POA: Diagnosis not present

## 2012-03-30 DIAGNOSIS — I10 Essential (primary) hypertension: Secondary | ICD-10-CM | POA: Diagnosis not present

## 2012-03-30 DIAGNOSIS — I270 Primary pulmonary hypertension: Secondary | ICD-10-CM | POA: Insufficient documentation

## 2012-03-30 DIAGNOSIS — I272 Pulmonary hypertension, unspecified: Secondary | ICD-10-CM

## 2012-03-30 NOTE — Progress Notes (Signed)
This encounter was created in error - please disregard.

## 2012-03-30 NOTE — Progress Notes (Signed)
Echocardiogram performed.  

## 2012-03-30 NOTE — Telephone Encounter (Signed)
Opened in error

## 2012-03-30 NOTE — Progress Notes (Signed)
EKG completed for pt to have [facility]--pt needs ECHO before hyperbaric procedure--results shared with dr [place], who [facility] EKG

## 2012-03-31 DIAGNOSIS — E1169 Type 2 diabetes mellitus with other specified complication: Secondary | ICD-10-CM | POA: Diagnosis not present

## 2012-03-31 DIAGNOSIS — L97509 Non-pressure chronic ulcer of other part of unspecified foot with unspecified severity: Secondary | ICD-10-CM | POA: Diagnosis not present

## 2012-03-31 DIAGNOSIS — M869 Osteomyelitis, unspecified: Secondary | ICD-10-CM | POA: Diagnosis not present

## 2012-03-31 NOTE — Progress Notes (Signed)
Error

## 2012-04-07 DIAGNOSIS — M869 Osteomyelitis, unspecified: Secondary | ICD-10-CM | POA: Diagnosis not present

## 2012-04-07 DIAGNOSIS — E1169 Type 2 diabetes mellitus with other specified complication: Secondary | ICD-10-CM | POA: Diagnosis not present

## 2012-04-07 DIAGNOSIS — L97509 Non-pressure chronic ulcer of other part of unspecified foot with unspecified severity: Secondary | ICD-10-CM | POA: Diagnosis not present

## 2012-04-13 ENCOUNTER — Other Ambulatory Visit (HOSPITAL_COMMUNITY): Payer: Self-pay

## 2012-04-13 ENCOUNTER — Other Ambulatory Visit: Payer: Self-pay

## 2012-04-14 ENCOUNTER — Encounter (HOSPITAL_BASED_OUTPATIENT_CLINIC_OR_DEPARTMENT_OTHER): Payer: Medicare Other

## 2012-04-14 ENCOUNTER — Other Ambulatory Visit (HOSPITAL_COMMUNITY): Payer: Self-pay | Admitting: Cardiology

## 2012-04-14 DIAGNOSIS — L97509 Non-pressure chronic ulcer of other part of unspecified foot with unspecified severity: Secondary | ICD-10-CM | POA: Diagnosis not present

## 2012-04-14 DIAGNOSIS — E1169 Type 2 diabetes mellitus with other specified complication: Secondary | ICD-10-CM | POA: Diagnosis not present

## 2012-04-14 DIAGNOSIS — T07XXXA Unspecified multiple injuries, initial encounter: Secondary | ICD-10-CM | POA: Diagnosis not present

## 2012-04-14 DIAGNOSIS — M869 Osteomyelitis, unspecified: Secondary | ICD-10-CM | POA: Diagnosis not present

## 2012-04-14 MED ORDER — SPIRONOLACTONE 25 MG PO TABS: 25.0000 mg | ORAL_TABLET | Freq: Two times a day (BID) | ORAL | Status: DC

## 2012-04-14 NOTE — Telephone Encounter (Signed)
PT CALLED FOR REFILL ON SPIRO SHE STATES SHE TAKES 25 MG TWICE A DAY, OUR RECORDS INDICATE 25 MG DAILY, PER PT AND HER SISTER SHE HAS BEEN ON 25 MG TWICE DAILY FOR A LONG TIME, PER HEATHER SCHUB, RN PRESCRIPTION CHANGED TO 25 MG BID AND NEW SENT IN

## 2012-04-21 ENCOUNTER — Inpatient Hospital Stay (HOSPITAL_COMMUNITY)
Admission: AD | Admit: 2012-04-21 | Discharge: 2012-04-28 | DRG: 291 | Disposition: A | Discharge status: Home / Self Care | Payer: Medicare Other | Source: Ambulatory Visit | Attending: Internal Medicine | Admitting: Internal Medicine

## 2012-04-21 ENCOUNTER — Encounter (HOSPITAL_BASED_OUTPATIENT_CLINIC_OR_DEPARTMENT_OTHER): Payer: Medicare Other | Attending: Internal Medicine

## 2012-04-21 ENCOUNTER — Ambulatory Visit (HOSPITAL_BASED_OUTPATIENT_CLINIC_OR_DEPARTMENT_OTHER)
Admission: RE | Admit: 2012-04-21 | Discharge: 2012-04-21 | Disposition: A | Discharge status: Home / Self Care | Payer: Medicare Other | Source: Ambulatory Visit | Attending: Internal Medicine | Admitting: Internal Medicine

## 2012-04-21 ENCOUNTER — Encounter (HOSPITAL_COMMUNITY): Payer: Self-pay | Admitting: General Practice

## 2012-04-21 VITALS — BP 140/68 | HR 84 | Wt 294.5 lb

## 2012-04-21 DIAGNOSIS — N179 Acute kidney failure, unspecified: Secondary | ICD-10-CM | POA: Diagnosis present

## 2012-04-21 DIAGNOSIS — G4733 Obstructive sleep apnea (adult) (pediatric): Secondary | ICD-10-CM | POA: Diagnosis present

## 2012-04-21 DIAGNOSIS — Z7982 Long term (current) use of aspirin: Secondary | ICD-10-CM | POA: Diagnosis not present

## 2012-04-21 DIAGNOSIS — L97509 Non-pressure chronic ulcer of other part of unspecified foot with unspecified severity: Secondary | ICD-10-CM | POA: Diagnosis present

## 2012-04-21 DIAGNOSIS — E1169 Type 2 diabetes mellitus with other specified complication: Secondary | ICD-10-CM | POA: Diagnosis not present

## 2012-04-21 DIAGNOSIS — I5033 Acute on chronic diastolic (congestive) heart failure: Secondary | ICD-10-CM | POA: Diagnosis present

## 2012-04-21 DIAGNOSIS — I1310 Hypertensive heart and chronic kidney disease without heart failure, with stage 1 through stage 4 chronic kidney disease, or unspecified chronic kidney disease: Secondary | ICD-10-CM

## 2012-04-21 DIAGNOSIS — E876 Hypokalemia: Secondary | ICD-10-CM

## 2012-04-21 DIAGNOSIS — E662 Morbid (severe) obesity with alveolar hypoventilation: Secondary | ICD-10-CM | POA: Diagnosis present

## 2012-04-21 DIAGNOSIS — M869 Osteomyelitis, unspecified: Secondary | ICD-10-CM | POA: Insufficient documentation

## 2012-04-21 DIAGNOSIS — I130 Hypertensive heart and chronic kidney disease with heart failure and stage 1 through stage 4 chronic kidney disease, or unspecified chronic kidney disease: Principal | ICD-10-CM | POA: Diagnosis present

## 2012-04-21 DIAGNOSIS — Z794 Long term (current) use of insulin: Secondary | ICD-10-CM

## 2012-04-21 DIAGNOSIS — Z23 Encounter for immunization: Secondary | ICD-10-CM | POA: Diagnosis not present

## 2012-04-21 DIAGNOSIS — Z6841 Body Mass Index (BMI) 40.0 and over, adult: Secondary | ICD-10-CM

## 2012-04-21 DIAGNOSIS — N390 Urinary tract infection, site not specified: Secondary | ICD-10-CM

## 2012-04-21 DIAGNOSIS — I509 Heart failure, unspecified: Secondary | ICD-10-CM | POA: Diagnosis present

## 2012-04-21 DIAGNOSIS — N189 Chronic kidney disease, unspecified: Secondary | ICD-10-CM | POA: Diagnosis not present

## 2012-04-21 DIAGNOSIS — M9080 Osteopathy in diseases classified elsewhere, unspecified site: Secondary | ICD-10-CM | POA: Diagnosis not present

## 2012-04-21 DIAGNOSIS — I5032 Chronic diastolic (congestive) heart failure: Secondary | ICD-10-CM

## 2012-04-21 DIAGNOSIS — R52 Pain, unspecified: Secondary | ICD-10-CM | POA: Diagnosis not present

## 2012-04-21 DIAGNOSIS — E669 Obesity, unspecified: Secondary | ICD-10-CM | POA: Insufficient documentation

## 2012-04-21 DIAGNOSIS — R0989 Other specified symptoms and signs involving the circulatory and respiratory systems: Secondary | ICD-10-CM

## 2012-04-21 HISTORY — DX: Malignant (primary) neoplasm, unspecified: C80.1

## 2012-04-21 HISTORY — DX: Unspecified osteoarthritis, unspecified site: M19.90

## 2012-04-21 LAB — BASIC METABOLIC PANEL
CO2: 30 mEq/L (ref 19–32)
Calcium: 9.7 mg/dL (ref 8.4–10.5)
GFR calc non Af Amer: 55 mL/min — ABNORMAL LOW (ref >90)
Sodium: 143 mEq/L (ref 135–145)

## 2012-04-21 LAB — URINALYSIS, ROUTINE W REFLEX MICROSCOPIC
Bilirubin Urine: NEGATIVE
Ketones, ur: NEGATIVE mg/dL
Nitrite: POSITIVE — AB
Specific Gravity, Urine: 1.011 (ref 1.005–1.030)
Urobilinogen, UA: 0.2 mg/dL (ref 0.0–1.0)
pH: 5.5 (ref 5.0–8.0)

## 2012-04-21 LAB — CBC
MCH: 29.8 pg (ref 26.0–34.0)
MCHC: 33.3 g/dL (ref 30.0–36.0)
Platelets: 163 10*3/uL (ref 150–400)
RBC: 4.03 MIL/uL (ref 3.87–5.11)

## 2012-04-21 LAB — CREATININE, SERUM
Creatinine, Ser: 1.09 mg/dL (ref 0.50–1.10)
GFR calc non Af Amer: 51 mL/min — ABNORMAL LOW (ref >90)

## 2012-04-21 LAB — GLUCOSE, CAPILLARY: Glucose-Capillary: 192 mg/dL — ABNORMAL HIGH (ref 70–99)

## 2012-04-21 LAB — PRO B NATRIURETIC PEPTIDE: Pro B Natriuretic peptide (BNP): 1055 pg/mL — ABNORMAL HIGH (ref 0–125)

## 2012-04-21 MED ORDER — INFLUENZA VIRUS VACC SPLIT PF IM SUSP
0.5000 mL | INTRAMUSCULAR | Status: AC
  Administered 2012-04-22: 0.5 mL via INTRAMUSCULAR
  Filled 2012-04-21: qty 0.5

## 2012-04-21 MED ORDER — SIMVASTATIN 40 MG PO TABS
40.0000 mg | ORAL_TABLET | Freq: Every day | ORAL | Status: DC
  Administered 2012-04-21 – 2012-04-27 (×7): 40 mg via ORAL
  Filled 2012-04-21 (×8): qty 1

## 2012-04-21 MED ORDER — FUROSEMIDE 10 MG/ML IJ SOLN
80.0000 mg | Freq: Two times a day (BID) | INTRAMUSCULAR | Status: DC
  Administered 2012-04-21 – 2012-04-22 (×2): 80 mg via INTRAVENOUS
  Filled 2012-04-21 (×2): qty 8

## 2012-04-21 MED ORDER — ONDANSETRON HCL 4 MG/2ML IJ SOLN: 4.0000 mg | Freq: Four times a day (QID) | INTRAMUSCULAR | Status: DC | PRN

## 2012-04-21 MED ORDER — SODIUM CHLORIDE 0.9 % IJ SOLN: 3.0000 mL | INTRAMUSCULAR | Status: DC | PRN

## 2012-04-21 MED ORDER — FUROSEMIDE 10 MG/ML IJ SOLN
INTRAMUSCULAR | Status: AC
  Filled 2012-04-21: qty 8

## 2012-04-21 MED ORDER — ASPIRIN EC 325 MG PO TBEC
325.0000 mg | DELAYED_RELEASE_TABLET | Freq: Every day | ORAL | Status: DC
  Administered 2012-04-21 – 2012-04-28 (×8): 325 mg via ORAL
  Filled 2012-04-21 (×8): qty 1

## 2012-04-21 MED ORDER — LORAZEPAM 0.5 MG PO TABS
1.0000 mg | ORAL_TABLET | Freq: Two times a day (BID) | ORAL | Status: DC
  Administered 2012-04-21 – 2012-04-28 (×13): 1 mg via ORAL
  Filled 2012-04-21: qty 1
  Filled 2012-04-21 (×10): qty 2
  Filled 2012-04-21: qty 1
  Filled 2012-04-21 (×2): qty 2

## 2012-04-21 MED ORDER — ACETAMINOPHEN 325 MG PO TABS
650.0000 mg | ORAL_TABLET | ORAL | Status: DC | PRN
  Administered 2012-04-21 – 2012-04-26 (×5): 650 mg via ORAL
  Filled 2012-04-21 (×4): qty 2

## 2012-04-21 MED ORDER — DOXYCYCLINE HYCLATE 100 MG PO TABS
100.0000 mg | ORAL_TABLET | Freq: Two times a day (BID) | ORAL | Status: DC
  Administered 2012-04-21 – 2012-04-28 (×15): 100 mg via ORAL
  Filled 2012-04-21 (×17): qty 1

## 2012-04-21 MED ORDER — SODIUM CHLORIDE 0.9 % IV SOLN: 250.0000 mL | INTRAVENOUS | Status: DC | PRN

## 2012-04-21 MED ORDER — SODIUM CHLORIDE 0.9 % IJ SOLN
3.0000 mL | Freq: Two times a day (BID) | INTRAMUSCULAR | Status: DC
  Administered 2012-04-21 – 2012-04-28 (×12): 3 mL via INTRAVENOUS

## 2012-04-21 MED ORDER — POTASSIUM CHLORIDE CRYS ER 20 MEQ PO TBCR
40.0000 meq | EXTENDED_RELEASE_TABLET | Freq: Once | ORAL | Status: AC
  Administered 2012-04-21: 40 meq via ORAL

## 2012-04-21 MED ORDER — INSULIN GLARGINE 100 UNIT/ML ~~LOC~~ SOLN
42.0000 [IU] | Freq: Every day | SUBCUTANEOUS | Status: DC
  Administered 2012-04-22 – 2012-04-28 (×7): 42 [IU] via SUBCUTANEOUS

## 2012-04-21 MED ORDER — POTASSIUM CHLORIDE CRYS ER 20 MEQ PO TBCR
40.0000 meq | EXTENDED_RELEASE_TABLET | Freq: Every day | ORAL | Status: DC
  Filled 2012-04-21: qty 2

## 2012-04-21 MED ORDER — CARVEDILOL 3.125 MG PO TABS
3.1250 mg | ORAL_TABLET | Freq: Two times a day (BID) | ORAL | Status: DC
  Administered 2012-04-21 – 2012-04-28 (×14): 3.125 mg via ORAL
  Filled 2012-04-21 (×18): qty 1

## 2012-04-21 MED ORDER — SERTRALINE HCL 100 MG PO TABS
100.0000 mg | ORAL_TABLET | Freq: Every day | ORAL | Status: DC
  Administered 2012-04-21 – 2012-04-28 (×8): 100 mg via ORAL
  Filled 2012-04-21 (×8): qty 1

## 2012-04-21 MED ORDER — SPIRONOLACTONE 25 MG PO TABS
25.0000 mg | ORAL_TABLET | Freq: Two times a day (BID) | ORAL | Status: DC
  Administered 2012-04-21 – 2012-04-28 (×14): 25 mg via ORAL
  Filled 2012-04-21 (×18): qty 1

## 2012-04-21 MED ORDER — POTASSIUM CHLORIDE CRYS ER 20 MEQ PO TBCR
EXTENDED_RELEASE_TABLET | ORAL | Status: AC
  Filled 2012-04-21: qty 2

## 2012-04-21 MED ORDER — ENOXAPARIN SODIUM 40 MG/0.4ML ~~LOC~~ SOLN
40.0000 mg | SUBCUTANEOUS | Status: DC
  Administered 2012-04-21: 40 mg via SUBCUTANEOUS
  Filled 2012-04-21 (×2): qty 0.4

## 2012-04-21 MED ORDER — INSULIN GLARGINE 100 UNIT/ML ~~LOC~~ SOLN
80.0000 [IU] | Freq: Every day | SUBCUTANEOUS | Status: DC
  Administered 2012-04-21 – 2012-04-27 (×7): 80 [IU] via SUBCUTANEOUS

## 2012-04-21 NOTE — Plan of Care (Signed)
Problem: Phase I Progression Outcomes Goal: Dyspnea controlled at rest (HF) Outcome: Completed/Met Date Met:  04/21/12 Pt has not had any c/o dyspnea while at rest, pt on RA O2 WNL, goal met, will continue to monitor  Goal: Pain controlled with appropriate interventions Outcome: Completed/Met Date Met:  04/21/12 Pt has had no c/o pain since admission, goal met Goal: Voiding-avoid urinary catheter unless indicated Outcome: Completed/Met Date Met:  04/21/12 Pt oob to bathroom, voiding appropriately, no s/s of retention, pt receiving IV lasix, goal met

## 2012-04-21 NOTE — Assessment & Plan Note (Signed)
Continue doxy and dressings. Wound care to follow in house as needed.

## 2012-04-21 NOTE — Progress Notes (Signed)
Patient placed on CPAP with Full Face Mask per home settings. She is on Auto mode and tolerates it very well at this time.

## 2012-04-21 NOTE — Assessment & Plan Note (Addendum)
She is markedly volume overloaded with R>L HF symptoms. Has not had much response to increase in oral diuretics. Will admit for IV diuresis. Given h/o cardiorenal syndrome need to watch renal function closely.

## 2012-04-21 NOTE — Progress Notes (Signed)
Utilization Review Completed.  

## 2012-04-21 NOTE — Progress Notes (Signed)
Patient ID: [name], female   DOB: 1945-10-05, 66 y.o.   MRN: [id number] HPI:  Primary Cardiologist:  Dr. [name] is a 66 y.o. female with a history of morbid obesity complicated by obesity hypoventilation syndrome, hypertension, sleep apnea, diabetes, CRI and mild secondary pulmonary hypertension as well as hypokalemia. Carotid stenosis 40-59% bilaterally.  Initially, she had an echocardiogram read as severe LV dysfunction with an EF of 15-20%. She underwent catheterization in 2008 that showed minimal nonobstructive coronary artery disease with EF 55%. She had mild pulmonary hypertension with both pulmonary venous and pulmonary arterial components. Her PVR was 5.1 Wood units. Echo in 3/09 EF 60%.  Saw Dr. Antoine [name] in 8/11 over question of possible AF. ECG with PACs. 24 monitor NSR with occ PACs and PVCS. No AF.    Pt was hospitalized at the end of August 2012 for acute on chronic diastolic heart failure.  Diuresed ~17 lbs with discharge weight of 282 lbs.  Switched from ACE-I to ARB due to cough.  She returns for an acute work in due to increase lower extremity edema. She is unable to to weigh due to balance problems. Weight here up almost 25 pounds from baseline of 270. She increased Torsemide 20 mg twice a day for the last 2 days.  SOB with exertion. ++edema. Denies orthopnea/CP/PND. Complains of fatigue. Uses Bipap nightly. Treated for UTI 1 month  Ago.  Developed R great toe ulcer about 5 weeks ago and she is followed at the wound center weekly. She has been on doxycycline for 1 month. Drinks less than  2liters per day.    ROS: All systems negative except as listed in HPI, PMH and Problem List.  Past Medical History  Diagnosis Date  . OSA (obstructive sleep apnea)   . Thrombophlebitis of deep vein of leg   . DM (diabetes mellitus)   . CHF (congestive heart failure)     hx of EF 15-20% in 4/08,  echo 3/09 EF 60-65%;   echo 6/12: EF 55-60%, mild LAE  . Carotid  stenosis     u/s 6/10: R 0-39%Ll 40-59%;  u/s 7/12: 40-59% bilateral (repeat in 7/13)  . HTN (hypertension)   . Morbid obesity   . Pulmonary HTN     mild,  cath 6/08 with PVR 5.1  . Hypokalemia   . 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 Spinal stenosis   . Obesity hypoventilation syndrome      Current Outpatient Prescriptions on File Prior to Encounter  Medication Sig Dispense Refill  . aspirin (BAYER ASPIRIN) 325 MG tablet Take 325 mg by mouth daily.        . carvedilol (COREG) 3.125 MG tablet Take 3.125 mg by mouth 2 (two) times daily with a meal.       . cyanocobalamin (,VITAMIN B-12,) 1000 MCG/ML injection Inject 1,000 mcg into the muscle every 30 (thirty) days.       . insulin glargine (LANTUS) 100 UNIT/ML injection Use as directed 42 UNITS in the morning and 80 units at night      . insulin glulisine (APIDRA) 100 UNIT/ML injection Inject 10 Units into the skin 3 (three) times daily before meals.       Marland Kitchen LORazepam (ATIVAN) 1 MG tablet Take 1 mg by mouth 2 (two) times daily.        . sertraline (ZOLOFT) 100 MG tablet Take 1/2 tab by mouth  once daily       . simvastatin (ZOCOR) 40 MG tablet Take 1 tablet (40 mg total) by mouth at bedtime.  90 tablet  3  . spironolactone (ALDACTONE) 25 MG tablet Take 1 tablet (25 mg total) by mouth 2 (two) times daily.  60 tablet  6  . torsemide (DEMADEX) 20 MG tablet Take 20 mg by mouth daily.      . Vitamin D, Ergocalciferol, (DRISDOL) 50000 UNITS CAPS Take 50,000 Units by mouth 2 (two) times a week. Take on Mon and Thurs.       Allergies  Allergen Reactions  . Codeine   . Meperidine Hcl     Increase in heart rate  . Metformin      PHYSICAL EXAM: Filed Vitals:   04/21/12 1130  BP: 140/68  Pulse: 84  Weight: 294 lb 8 oz (133.584 kg)  SpO2: 95%    General: Chronically ill appearing appearing. No resp difficulty HEENT: normal Neck: supple. JVP hard to see.  Carotids 2+ bilaterally; no bruits. No lymphadenopathy  or thryomegaly appreciated. Cor: PMI normal. Regular rate & rhythm. No rubs, gallops or murmurs. Lungs: clear Abdomen: obese,  nontender, mildly distended. No hepatosplenomegaly. No bruits or masses. Good bowel sounds. Extremities: 3+ edema. +erythema No cyanosis or clubbing R great toe wrapped Neuro: alert & orientedx3, cranial nerves grossly intact. Moves all 4 extremities w/o difficulty. Affect pleasant.     ASSESSMENT & PLAN:

## 2012-04-21 NOTE — H&P (Signed)
Advanced Heart Failure Team History and Physical Note   Primary Physician:  Primary Cardiologist: Dr. Gala Romney  Reason for Admission: Massive volume overload  Baseline proBNP: 54 Weight Range: 266-270 pounds  HPI:    Sou is a 66 y.o. female with a history of morbid obesity complicated by obesity hypoventilation syndrome, hypertension, sleep apnea, diabetes, CRI and mild secondary pulmonary hypertension as well as hypokalemia. Carotid stenosis 40-59% bilaterally. Initially, she had an echocardiogram read as severe LV dysfunction with an EF of 15-20%. She underwent catheterization in 2008 that showed minimal nonobstructive coronary artery disease with EF 55%. She had mild pulmonary hypertension with both pulmonary venous and pulmonary arterial components. Her PVR was 5.1 Wood units. Echo in 3/09 EF 60%. Saw Dr. Antoine Poche in 8/11 over question of possible AF. ECG with PACs. 24 monitor NSR with occ PACs and PVCS. No AF.   She returned for an acute work in due to increase lower extremity edema. She is unable to to weigh due to balance problems. Weight here up almost 25 pounds from baseline of 270. She increased Torsemide 20 mg twice a day for the last 2 days. SOB with exertion. ++edema. Denies orthopnea/CP/PND. Complains of fatigue. Uses Bipap nightly. Treated for UTI 1 month Ago. Developed R great toe ulcer about 5 weeks ago and she is followed at the wound center weekly. She has been on doxycycline for 1 month. Drinks less than 2liters per day.    Review of Systems: [y] = yes, [ ]  = no   General: Weight gain Cove.Etienne ]; Weight loss [ ] ; Anorexia [ ] ; Fatigue Cove.Etienne ]; Fever [ ] ; Chills [ ] ; Weakness [ ]   Cardiac: Chest pain/pressure [ ] ; Resting SOB [ ] ; Exertional SOB [ ] ; Orthopnea [ ] ; Pedal Edema [ ] ; Palpitations [ ] ; Syncope [ ] ; Presyncope [ ] ; Paroxysmal nocturnal dyspnea[ ]   Pulmonary: Cough [ ] ; Wheezing[ ] ; Hemoptysis[ ] ; Sputum [ ] ; Snoring [ ]   GI: Vomiting[ ] ; Dysphagia[ ] ; Melena[ ] ;  Hematochezia [ ] ; Heartburn[ ] ; Abdominal pain [ ] ; Constipation [ ] ; Diarrhea [ ] ; BRBPR [ ]   GU: Hematuria[ ] ; Dysuria [ ] ; Nocturia[ ]   Vascular: Pain in legs with walking [ ] ; Pain in feet with lying flat [ ] ; Non-healing sores [ ] ; Stroke [ ] ; TIA [ ] ; Slurred speech [ ] ;  Neuro: Headaches[ ] ; Vertigo[ ] ; Seizures[ ] ; Paresthesias[ ] ;Blurred vision [ ] ; Diplopia [ ] ; Vision changes [ ]   Ortho/Skin: Arthritis [ ] ; Joint pain [ ] ; Muscle pain [ ] ; Joint swelling [ ] ; Back Pain [ ] ; Rash [ ]   Psych: Depression[ ] ; Anxiety[ ]   Heme: Bleeding problems [ ] ; Clotting disorders [ ] ; Anemia [ ]   Endocrine: Diabetes [ y]; Thyroid dysfunction[ ]   Home Medications Prior to Admission medications   Medication Sig Start Date End Date Taking? Authorizing Provider  aspirin (BAYER ASPIRIN) 325 MG tablet Take 325 mg by mouth daily.      Historical Provider, MD  carvedilol (COREG) 3.125 MG tablet Take 3.125 mg by mouth 2 (two) times daily with a meal.     Historical Provider, MD  cyanocobalamin (,VITAMIN B-12,) 1000 MCG/ML injection Inject 1,000 mcg into the muscle every 30 (thirty) days.     Historical Provider, MD  doxycycline (VIBRAMYCIN) 100 MG capsule Take 100 mg by mouth 2 (two) times daily.    Historical Provider, MD  insulin glargine (LANTUS) 100 UNIT/ML injection Use as directed 42 UNITS in the morning and 80 units  at night    Historical Provider, MD  insulin glulisine (APIDRA) 100 UNIT/ML injection Inject 10 Units into the skin 3 (three) times daily before meals.     Historical Provider, MD  LORazepam (ATIVAN) 1 MG tablet Take 1 mg by mouth 2 (two) times daily.      Historical Provider, MD  sertraline (ZOLOFT) 100 MG tablet Take 1/2 tab by mouth once daily     Historical Provider, MD  simvastatin (ZOCOR) 40 MG tablet Take 1 tablet (40 mg total) by mouth at bedtime. 01/12/12   Dolores Patty, MD  spironolactone (ALDACTONE) 25 MG tablet Take 1 tablet (25 mg total) by mouth 2 (two) times daily.  04/14/12   Dolores Patty, MD  torsemide (DEMADEX) 20 MG tablet Take 20 mg by mouth daily. 10/28/11   Dolores Patty, MD  Vitamin D, Ergocalciferol, (DRISDOL) 50000 UNITS CAPS Take 50,000 Units by mouth 2 (two) times a week. Take on Mon and Thurs.    Historical Provider, MD    Past Medical History: Past Medical History  Diagnosis Date  . OSA (obstructive sleep apnea)   . Thrombophlebitis of deep vein of leg   . DM (diabetes mellitus)   . CHF (congestive heart failure)     hx of EF 15-20% in 4/08,  echo 3/09 EF 60-65%;   echo 6/12: EF 55-60%, mild LAE  . Carotid stenosis     u/s 6/10: R 0-39%Ll 40-59%;  u/s 7/12: 40-59% bilateral (repeat in 7/13)  . HTN (hypertension)   . Morbid obesity   . Pulmonary HTN     mild,  cath 6/08 with PVR 5.1  . Hypokalemia   . 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 Spinal stenosis   . Obesity hypoventilation syndrome     Past Surgical History: Past Surgical History  Procedure Date  . Carpal tunnel release 07/2007  . Colon cancer with resection 2000  . Cholecystectomy 2002  . Vaginal hysterectomy 1983    Family History: Family History  Problem Relation Age of Onset  . Coronary artery disease Father     Social History: History   Social History  . Marital Status: Married    Spouse Name: N/A    Number of Children: 3  . Years of Education: N/A   Occupational History  . DISABLED    Social History Main Topics  . Smoking status: Never Smoker   . Smokeless tobacco: Never Used  . Alcohol Use: No  . Drug Use: No  . Sexually Active: Not on file   Other Topics Concern  . Not on file   Social History Narrative  . No narrative on file    Allergies:  Allergies  Allergen Reactions  . Codeine     sleepy  . Meperidine Hcl     Increase in heart rate  . Metformin     Makes me sick    Objective:    Vital Signs:   Temp:  [97.5 F (36.4 C)] 97.5 F (36.4 C) (10/03 1414) Pulse Rate:  [80-84] 80   (10/03 1414) Resp:  [18] 18  (10/03 1414) BP: (140-167)/(68-69) 167/69 mmHg (10/03 1414) SpO2:  [94 %-95 %] 94 % (10/03 1414) Weight:  [132.4 kg (291 lb 14.2 oz)-133.584 kg (294 lb 8 oz)] 132.4 kg (291 lb 14.2 oz) (10/03 1414) Last BM Date: 04/21/12 Filed Weights   04/21/12 1414  Weight: 132.4 kg (291 lb 14.2 oz)  Physical Exam: General: Chronically ill appearing appearing. No resp difficulty  HEENT: normal  Neck: supple. JVP hard to see. Carotids 2+ bilaterally; no bruits. No lymphadenopathy or thryomegaly appreciated.  Cor: PMI normal. Regular rate & rhythm. No rubs, gallops or murmurs.  Lungs: clear  Abdomen: obese, nontender, mildly distended. No hepatosplenomegaly. No bruits or masses. Good bowel sounds.  Extremities: 3+ edema. +erythema No cyanosis or clubbing R great toe wrapped  Neuro: alert & orientedx3, cranial nerves grossly intact. Moves all 4 extremities w/o difficulty. Affect pleasant.    Assessment:   1. Acute on chronic diastolic heart failure 2. Diabetic foot ulcer 3. DM 4. OSA with Bipap use 5. Morbid obesity  Plan/Discussion:    Ms. Hopp is markedly volume overloaded and has failed outpatient therapy.  Will admit to the telemetry unit for IV diuresis with lasix 80 mg IV BID.  Will monitor renal function closely as she has a history of cardiorenal syndrome.    Will also continue doxycycline and dressings for diabetic foot ulcer.  Will check u/a with recent UTI.    England Greb,MD 5:27 PM

## 2012-04-22 DIAGNOSIS — N390 Urinary tract infection, site not specified: Secondary | ICD-10-CM

## 2012-04-22 DIAGNOSIS — E876 Hypokalemia: Secondary | ICD-10-CM

## 2012-04-22 DIAGNOSIS — I1310 Hypertensive heart and chronic kidney disease without heart failure, with stage 1 through stage 4 chronic kidney disease, or unspecified chronic kidney disease: Secondary | ICD-10-CM

## 2012-04-22 DIAGNOSIS — N189 Chronic kidney disease, unspecified: Secondary | ICD-10-CM

## 2012-04-22 LAB — BASIC METABOLIC PANEL
CO2: 30 mEq/L (ref 19–32)
Calcium: 9.2 mg/dL (ref 8.4–10.5)
Creatinine, Ser: 0.88 mg/dL (ref 0.50–1.10)
GFR calc non Af Amer: 66 mL/min — ABNORMAL LOW (ref >90)
Glucose, Bld: 122 mg/dL — ABNORMAL HIGH (ref 70–99)

## 2012-04-22 LAB — GLUCOSE, CAPILLARY
Glucose-Capillary: 110 mg/dL — ABNORMAL HIGH (ref 70–99)
Glucose-Capillary: 193 mg/dL — ABNORMAL HIGH (ref 70–99)
Glucose-Capillary: 366 mg/dL — ABNORMAL HIGH (ref 70–99)

## 2012-04-22 MED ORDER — CIPROFLOXACIN HCL 250 MG PO TABS
250.0000 mg | ORAL_TABLET | Freq: Two times a day (BID) | ORAL | Status: DC
  Administered 2012-04-22 – 2012-04-28 (×13): 250 mg via ORAL
  Filled 2012-04-22 (×15): qty 1

## 2012-04-22 MED ORDER — ENOXAPARIN SODIUM 60 MG/0.6ML ~~LOC~~ SOLN
60.0000 mg | SUBCUTANEOUS | Status: DC
  Administered 2012-04-22 – 2012-04-27 (×6): 60 mg via SUBCUTANEOUS
  Filled 2012-04-22 (×7): qty 0.6

## 2012-04-22 MED ORDER — INSULIN ASPART 100 UNIT/ML ~~LOC~~ SOLN
0.0000 [IU] | Freq: Three times a day (TID) | SUBCUTANEOUS | Status: DC
  Administered 2012-04-22: 2 [IU] via SUBCUTANEOUS
  Administered 2012-04-23: 7 [IU] via SUBCUTANEOUS
  Administered 2012-04-23: 5 [IU] via SUBCUTANEOUS
  Administered 2012-04-24: 2 [IU] via SUBCUTANEOUS
  Administered 2012-04-24: 3 [IU] via SUBCUTANEOUS
  Administered 2012-04-24: 1 [IU] via SUBCUTANEOUS
  Administered 2012-04-25: 3 [IU] via SUBCUTANEOUS
  Administered 2012-04-25: 2 [IU] via SUBCUTANEOUS
  Administered 2012-04-26: 5 [IU] via SUBCUTANEOUS
  Administered 2012-04-26: 1 [IU] via SUBCUTANEOUS
  Administered 2012-04-26: 2 [IU] via SUBCUTANEOUS
  Administered 2012-04-27: 3 [IU] via SUBCUTANEOUS
  Administered 2012-04-27: 5 [IU] via SUBCUTANEOUS
  Administered 2012-04-27: 2 [IU] via SUBCUTANEOUS
  Administered 2012-04-28: 3 [IU] via SUBCUTANEOUS

## 2012-04-22 MED ORDER — METOLAZONE 2.5 MG PO TABS
2.5000 mg | ORAL_TABLET | Freq: Every day | ORAL | Status: DC
  Administered 2012-04-22 – 2012-04-26 (×5): 2.5 mg via ORAL
  Filled 2012-04-22 (×6): qty 1

## 2012-04-22 MED ORDER — FUROSEMIDE 10 MG/ML IJ SOLN
80.0000 mg | Freq: Three times a day (TID) | INTRAMUSCULAR | Status: DC
  Administered 2012-04-22 – 2012-04-27 (×15): 80 mg via INTRAVENOUS
  Filled 2012-04-22 (×18): qty 8

## 2012-04-22 MED ORDER — POTASSIUM CHLORIDE CRYS ER 20 MEQ PO TBCR
40.0000 meq | EXTENDED_RELEASE_TABLET | Freq: Three times a day (TID) | ORAL | Status: DC
  Administered 2012-04-22 – 2012-04-25 (×11): 40 meq via ORAL
  Filled 2012-04-22 (×11): qty 2

## 2012-04-22 MED ORDER — INSULIN ASPART 100 UNIT/ML ~~LOC~~ SOLN
0.0000 [IU] | Freq: Every day | SUBCUTANEOUS | Status: DC
Start: 2012-04-22 — End: 2012-04-28
  Administered 2012-04-22: 5 [IU] via SUBCUTANEOUS
  Administered 2012-04-24: 3 [IU] via SUBCUTANEOUS
  Administered 2012-04-25: 4 [IU] via SUBCUTANEOUS
  Administered 2012-04-26: 5 [IU] via SUBCUTANEOUS
  Administered 2012-04-27: 4 [IU] via SUBCUTANEOUS

## 2012-04-22 NOTE — Plan of Care (Signed)
Problem: Phase II Progression Outcomes Goal: Pain controlled Outcome: Completed/Met Date Met:  04/22/12 Pt has not had any c/o pain or SOB, goal met Goal: Dyspnea controlled with activity Outcome: Completed/Met Date Met:  04/22/12 Pt oob to bathroom, no dyspnea noted from pt, goal met Goal: Tolerating diet Outcome: Completed/Met Date Met:  04/22/12 Pt on heart healthy diet, no c/o n/v, pt tolerating diet well, goal met

## 2012-04-22 NOTE — Progress Notes (Signed)
Advanced Heart Failure Rounding Note   Subjective:     Gafford is a 66 y.o. female with a history of morbid obesity complicated by obesity hypoventilation syndrome, hypertension, sleep apnea, diabetes, CRI and mild secondary pulmonary hypertension as well as hypokalemia. Carotid stenosis 40-59% bilaterally. Initially, she had an echocardiogram read as severe LV dysfunction with an EF of 15-20%. She underwent catheterization in 2008 that showed minimal nonobstructive coronary artery disease with EF 55%. She had mild pulmonary hypertension with both pulmonary venous and pulmonary arterial components. Her PVR was 5.1 Wood units. Echo in 3/09 EF 60%. Saw Dr. Antoine Poche in 8/11 over question of possible AF. ECG with PACs. 24 monitor NSR with occ PACs and PVCS. No AF.   She returned for an acute work in due to increase lower extremity edema. She is unable to to weigh due to balance problems. Weight here up almost 25 pounds from baseline of 270. She increased Torsemide 20 mg twice a day for the last 2 days. SOB with exertion. ++edema. Denies orthopnea/CP/PND. Complains of fatigue. Uses Bipap nightly. Treated for UTI 1 month Ago. Developed R great toe ulcer about 5 weeks ago and she is followed at the wound center weekly. She has been on doxycycline for 1 month. Drinks less than 2liters per day.     Objective:   Weight Range:  Vital Signs:   Temp:  [97.5 F (36.4 C)-98.6 F (37 C)] 98.6 F (37 C) (10/04 0612) Pulse Rate:  [69-84] 82  (10/04 0612) Resp:  [18-19] 18  (10/04 0612) BP: (135-167)/(45-98) 155/65 mmHg (10/04 0612) SpO2:  [94 %-98 %] 95 % (10/04 0612) Weight:  [131.951 kg (290 lb 14.4 oz)-133.584 kg (294 lb 8 oz)] 131.951 kg (290 lb 14.4 oz) (10/04 0612) Last BM Date: 04/21/12  Weight change: Filed Weights   04/21/12 1414 04/22/12 0612  Weight: 132.4 kg (291 lb 14.2 oz) 131.951 kg (290 lb 14.4 oz)    Intake/Output:   Intake/Output Summary (Last 24 hours) at 04/22/12 0853 Last data  filed at 04/22/12 0833  Gross per 24 hour  Intake    720 ml  Output   1500 ml  Net   -780 ml     Physical Exam:  General: Chronically ill appearing appearing. No resp difficulty  HEENT: normal  Neck: supple. JVP hard to see. Carotids 2+ bilaterally; no bruits. No lymphadenopathy or thryomegaly appreciated.  Cor: PMI normal. Regular rate & rhythm. No rubs, gallops or murmurs.  Lungs: clear  Abdomen: obese, nontender, mildly distended. No hepatosplenomegaly. No bruits or masses. Good bowel sounds.  Extremities: 3+ edema. +erythema No cyanosis or clubbing R great toe wrapped  Neuro: alert & orientedx3, cranial nerves grossly intact. Moves all 4 extremities w/o difficulty. Affect pleasant.   Telemetry:  SR  Labs: Basic Metabolic Panel:  Lab 04/22/12 3086 04/21/12 1448 04/21/12 1200  NA 140 -- 143  K 3.4* -- 3.3*  CL 102 -- 104  CO2 30 -- 30  GLUCOSE 122* -- 121*  BUN 27* -- 29*  CREATININE 0.88 1.09 1.03  CALCIUM 9.2 -- 9.7  MG -- -- --  PHOS -- -- --    Liver Function Tests: No results found for this basename: AST:5,ALT:5,ALKPHOS:5,BILITOT:5,PROT:5,ALBUMIN:5 in the last 168 hours No results found for this basename: LIPASE:5,AMYLASE:5 in the last 168 hours No results found for this basename: AMMONIA:3 in the last 168 hours  CBC:  Lab 04/21/12 1448  WBC 10.0  NEUTROABS --  HGB 12.0  HCT 36.0  MCV 89.3  PLT 163    Cardiac Enzymes: No results found for this basename: CKTOTAL:5,CKMB:5,CKMBINDEX:5,TROPONINI:5 in the last 168 hours  BNP: BNP (last 3 results)  Basename 04/21/12 1200  PROBNP 1055.0*     Other results:  EKG:   Imaging:  No results found.   Medications:     Scheduled Medications:    . aspirin EC  325 mg Oral Daily  . carvedilol  3.125 mg Oral BID WC  . doxycycline  100 mg Oral Q12H  . enoxaparin  40 mg Subcutaneous Q24H  . furosemide  80 mg Intravenous BID  . influenza  inactive virus vaccine  0.5 mL Intramuscular Tomorrow-1000  .  insulin glargine  42 Units Subcutaneous Daily  . insulin glargine  80 Units Subcutaneous QHS  . LORazepam  1 mg Oral BID  . potassium chloride  40 mEq Oral Once  . potassium chloride  40 mEq Oral Daily  . sertraline  100 mg Oral Daily  . simvastatin  40 mg Oral q1800  . sodium chloride  3 mL Intravenous Q12H  . spironolactone  25 mg Oral BID     Infusions:     PRN Medications:  sodium chloride, acetaminophen, ondansetron (ZOFRAN) IV, sodium chloride   Assessment:   1. Acute on chronic diastolic heart failure  2. Diabetic foot ulcer  3. DM  4. OSA with Bipap use  5. Morbid obesity 6. Hypokalemia 7. Cardiorenal syndrome 8. UTI   Plan/Discussion:    Diuresing sluggishly on lasix 80mg  IV bid. Suspect she has significant cardio-renal syndrome. Will increase lasix to 80 IV tid and add metoalzone 2.5 daily.  Start cipro for UTI. Await cx. Supp K+   Length of Stay: 1  Yvan Dority 04/22/2012, 8:53 AM

## 2012-04-22 NOTE — Progress Notes (Signed)
Placed pt. On cpap as per order. Pt. Is on auto titrate mode. Pt. Is tolerating well at this time.

## 2012-04-22 NOTE — Progress Notes (Signed)
Utilization review completed.  

## 2012-04-22 NOTE — Progress Notes (Signed)
Patient c/o pain 2/10 to head.  Tylenol given x1. Patient appears in NAD.  Family at bedside.  RN will continue to monitor. Louretta Parma, RN

## 2012-04-22 NOTE — Plan of Care (Signed)
Problem: Phase I Progression Outcomes Goal: EF % per last Echo/documented,Core Reminder form on chart Outcome: Completed/Met Date Met:  04/22/12 Echo done 9/13 showed EF 50%

## 2012-04-23 DIAGNOSIS — E1169 Type 2 diabetes mellitus with other specified complication: Secondary | ICD-10-CM

## 2012-04-23 DIAGNOSIS — L97509 Non-pressure chronic ulcer of other part of unspecified foot with unspecified severity: Secondary | ICD-10-CM

## 2012-04-23 LAB — GLUCOSE, CAPILLARY
Glucose-Capillary: 265 mg/dL — ABNORMAL HIGH (ref 70–99)
Glucose-Capillary: 314 mg/dL — ABNORMAL HIGH (ref 70–99)
Glucose-Capillary: 198 mg/dL — ABNORMAL HIGH (ref 70–99)

## 2012-04-23 LAB — BASIC METABOLIC PANEL
Chloride: 103 mEq/L (ref 96–112)
GFR calc Af Amer: 73 mL/min — ABNORMAL LOW (ref >90)
Potassium: 4.1 mEq/L (ref 3.5–5.1)

## 2012-04-23 NOTE — Progress Notes (Signed)
Advanced Heart Failure Rounding Note   Subjective:     [name] is a 66 y.o. female with a history of morbid obesity complicated by obesity hypoventilation syndrome, hypertension, sleep apnea, diabetes, CRI and mild secondary pulmonary hypertension as well as hypokalemia. Carotid stenosis 40-59% bilaterally. Initially, she had an echocardiogram read as severe LV dysfunction with an EF of 15-20%. She underwent catheterization in 2008 that showed minimal nonobstructive coronary artery disease with EF 55%. She had mild pulmonary hypertension with both pulmonary venous and pulmonary arterial components. Her PVR was 5.1 Wood units. Echo in 3/09 EF 60%.   Admitted recently for 25-30 pound weight gain. Lasix increased and metolazone added as she was diuresing sluggishly. Much better today. Down 4 pounds. No orthopnea or PND. Renal function stable. Being treated for UTI with cipro and doxy for foot ulcer.     Objective:   Weight Range:  Vital Signs:   Temp:  [97.1 F (36.2 C)-97.9 F (36.6 C)] 97.8 F (36.6 C) (10/05 0541) Pulse Rate:  [73-82] 82  (10/05 0541) Resp:  [16-20] 20  (10/05 0541) BP: (133-154)/(46-82) 147/75 mmHg (10/05 0541) SpO2:  [94 %-96 %] 96 % (10/05 0541) Weight:  [129.8 kg (286 lb 2.5 oz)] 129.8 kg (286 lb 2.5 oz) (10/05 0541) Last BM Date: 04/22/12  Weight change: Filed Weights   04/21/12 1414 04/22/12 0612 04/23/12 0541  Weight: 132.4 kg (291 lb 14.2 oz) 131.951 kg (290 lb 14.4 oz) 129.8 kg (286 lb 2.5 oz)    Intake/Output:   Intake/Output Summary (Last 24 hours) at 04/23/12 0750 Last data filed at 04/23/12 1610  Gross per 24 hour  Intake   1200 ml  Output   3225 ml  Net  -2025 ml     Physical Exam:  General: Chronically ill appearing appearing. No resp difficulty  HEENT: normal  Neck: supple. JVP hard to see. Carotids 2+ bilaterally; no bruits. No lymphadenopathy or thryomegaly appreciated.  Cor: PMI normal. Regular rate & rhythm. No rubs, gallops or murmurs.   Lungs: clear  Abdomen: obese, nontender, mildly distended. No hepatosplenomegaly. No bruits or masses. Good bowel sounds.  Extremities: 3+ edema. +erythema No cyanosis or clubbing R great toe wrapped  Neuro: alert & orientedx3, cranial nerves grossly intact. Moves all 4 extremities w/o difficulty. Affect pleasant.   Telemetry:  SR  Labs: Basic Metabolic Panel:  Lab 04/23/12 9604 04/22/12 0650 04/21/12 1448 04/21/12 1200  NA 142 140 -- 143  K 4.1 3.4* -- 3.3*  CL 103 102 -- 104  CO2 30 30 -- 30  GLUCOSE 115* 122* -- 121*  BUN 25* 27* -- 29*  CREATININE 0.92 0.88 1.09 1.03  CALCIUM 9.7 9.2 -- 9.7  MG -- -- -- --  PHOS -- -- -- --    Liver Function Tests: No results found for this basename: AST:5,ALT:5,ALKPHOS:5,BILITOT:5,PROT:5,ALBUMIN:5 in the last 168 hours No results found for this basename: LIPASE:5,AMYLASE:5 in the last 168 hours No results found for this basename: AMMONIA:3 in the last 168 hours  CBC:  Lab 04/21/12 1448  WBC 10.0  NEUTROABS --  HGB 12.0  HCT 36.0  MCV 89.3  PLT 163    Cardiac Enzymes: No results found for this basename: CKTOTAL:5,CKMB:5,CKMBINDEX:5,TROPONINI:5 in the last 168 hours  BNP: BNP (last 3 results)  Basename 04/21/12 1200  PROBNP 1055.0*     Other results:  EKG:   Imaging: No results found.   Medications:     Scheduled Medications:    . aspirin EC  325 mg Oral Daily  . carvedilol  3.125 mg Oral BID WC  . ciprofloxacin  250 mg Oral BID  . doxycycline  100 mg Oral Q12H  . enoxaparin  60 mg Subcutaneous Q24H  . furosemide  80 mg Intravenous Q8H  . influenza  inactive virus vaccine  0.5 mL Intramuscular Tomorrow-1000  . insulin aspart  0-5 Units Subcutaneous QHS  . insulin aspart  0-9 Units Subcutaneous TID WC  . insulin glargine  42 Units Subcutaneous Daily  . insulin glargine  80 Units Subcutaneous QHS  . LORazepam  1 mg Oral BID  . metolazone  2.5 mg Oral Daily  . potassium chloride  40 mEq Oral TID  .  sertraline  100 mg Oral Daily  . simvastatin  40 mg Oral q1800  . sodium chloride  3 mL Intravenous Q12H  . spironolactone  25 mg Oral BID  . DISCONTD: enoxaparin  40 mg Subcutaneous Q24H  . DISCONTD: furosemide  80 mg Intravenous BID  . DISCONTD: potassium chloride  40 mEq Oral Daily    Infusions:    PRN Medications: sodium chloride, acetaminophen, ondansetron (ZOFRAN) IV, sodium chloride   Assessment:   1. Acute on chronic diastolic heart failure, R>>L symptoms 2. Diabetic foot ulcer  3. DM  4. OSA with Bipap use  5. Morbid obesity 6. Hypokalemia 7. Cardiorenal syndrome 8. UTI   Plan/Discussion:    Diuresis improving on current regimen but still with marked volume overload. If diuresis slows down may need a little dopamine support or UF. Reinforced need to keep legs up.   Length of Stay: 2  Arvilla Meres 04/23/2012, 7:50 AM

## 2012-04-24 LAB — BASIC METABOLIC PANEL
Calcium: 9.6 mg/dL (ref 8.4–10.5)
Creatinine, Ser: 1.04 mg/dL (ref 0.50–1.10)
GFR calc Af Amer: 63 mL/min — ABNORMAL LOW (ref >90)

## 2012-04-24 LAB — GLUCOSE, CAPILLARY

## 2012-04-24 NOTE — Progress Notes (Signed)
Advanced Heart Failure Rounding Note   Subjective:     [name] is a 66 y.o. female with a history of morbid obesity complicated by obesity hypoventilation syndrome, hypertension, sleep apnea, diabetes, CRI and mild secondary pulmonary hypertension as well as hypokalemia. Carotid stenosis 40-59% bilaterally. Initially, she had an echocardiogram read as severe LV dysfunction with an EF of 15-20%. She underwent catheterization in 2008 that showed minimal nonobstructive coronary artery disease with EF 55%. She had mild pulmonary hypertension with both pulmonary venous and pulmonary arterial components. Her PVR was 5.1 Wood units. Echo in 3/09 EF 60%.   Admitted recently for 25-30 pound weight gain. Lasix increased and metolazone added as she was diuresing sluggishly.    Continues to diurese. Down another 6 pounds. Now 280. (Baseline 265-270) No orthopnea or PND. Renal function stable. Being treated for UTI with cipro and doxy for foot ulcer.     Objective:   Weight Range:  Vital Signs:   Temp:  [97.5 F (36.4 C)-97.6 F (36.4 C)] 97.5 F (36.4 C) (10/06 0534) Pulse Rate:  [67-72] 67  (10/06 0534) Resp:  [18-19] 18  (10/06 0534) BP: (121-159)/(59-78) 121/59 mmHg (10/06 0534) SpO2:  [95 %-99 %] 97 % (10/06 0534) Weight:  [127.415 kg (280 lb 14.4 oz)] 127.415 kg (280 lb 14.4 oz) (10/06 0534) Last BM Date: 04/23/12  Weight change: Filed Weights   04/22/12 0612 04/23/12 0541 04/24/12 0534  Weight: 131.951 kg (290 lb 14.4 oz) 129.8 kg (286 lb 2.5 oz) 127.415 kg (280 lb 14.4 oz)    Intake/Output:   Intake/Output Summary (Last 24 hours) at 04/24/12 0811 Last data filed at 04/24/12 0535  Gross per 24 hour  Intake   1163 ml  Output   2550 ml  Net  -1387 ml     Physical Exam:  General: Chronically ill appearing appearing. No resp difficulty  HEENT: normal  Neck: supple. JVP hard to see. Carotids 2+ bilaterally; no bruits. No lymphadenopathy or thryomegaly appreciated.  Cor: PMI  normal. Regular rate & rhythm. No rubs, gallops or murmurs.  Lungs: clear  Abdomen: obese, nontender, mildly distended. No hepatosplenomegaly. No bruits or masses. Good bowel sounds.  Extremities: 3+ firm edema. +erythema No cyanosis or clubbing R great toe wrapped  Neuro: alert & orientedx3, cranial nerves grossly intact. Moves all 4 extremities w/o difficulty. Affect pleasant.   Telemetry:  SR  Labs: Basic Metabolic Panel:  Lab 04/24/12 0981 04/23/12 0600 04/22/12 0650 04/21/12 1448 04/21/12 1200  NA 140 142 140 -- 143  K 4.1 4.1 3.4* -- 3.3*  CL 100 103 102 -- 104  CO2 29 30 30  -- 30  GLUCOSE 139* 115* 122* -- 121*  BUN 30* 25* 27* -- 29*  CREATININE 1.04 0.92 0.88 1.09 1.03  CALCIUM 9.6 9.7 9.2 -- --  MG -- -- -- -- --  PHOS -- -- -- -- --    Liver Function Tests: No results found for this basename: AST:5,ALT:5,ALKPHOS:5,BILITOT:5,PROT:5,ALBUMIN:5 in the last 168 hours No results found for this basename: LIPASE:5,AMYLASE:5 in the last 168 hours No results found for this basename: AMMONIA:3 in the last 168 hours  CBC:  Lab 04/21/12 1448  WBC 10.0  NEUTROABS --  HGB 12.0  HCT 36.0  MCV 89.3  PLT 163    Cardiac Enzymes: No results found for this basename: CKTOTAL:5,CKMB:5,CKMBINDEX:5,TROPONINI:5 in the last 168 hours  BNP: BNP (last 3 results)  Basename 04/21/12 1200  PROBNP 1055.0*     Other results:  EKG:  Imaging: No results found.   Medications:     Scheduled Medications:    . aspirin EC  325 mg Oral Daily  . carvedilol  3.125 mg Oral BID WC  . ciprofloxacin  250 mg Oral BID  . doxycycline  100 mg Oral Q12H  . enoxaparin  60 mg Subcutaneous Q24H  . furosemide  80 mg Intravenous Q8H  . insulin aspart  0-5 Units Subcutaneous QHS  . insulin aspart  0-9 Units Subcutaneous TID WC  . insulin glargine  42 Units Subcutaneous Daily  . insulin glargine  80 Units Subcutaneous QHS  . LORazepam  1 mg Oral BID  . metolazone  2.5 mg Oral Daily  .  potassium chloride  40 mEq Oral TID  . sertraline  100 mg Oral Daily  . simvastatin  40 mg Oral q1800  . sodium chloride  3 mL Intravenous Q12H  . spironolactone  25 mg Oral BID    Infusions:    PRN Medications: sodium chloride, acetaminophen, ondansetron (ZOFRAN) IV, sodium chloride   Assessment:   1. Acute on chronic diastolic heart failure, R>>L symptoms 2. Diabetic foot ulcer  3. DM  4. OSA with Bipap use  5. Morbid obesity 6. Hypokalemia 7. Cardiorenal syndrome 8. UTI   Plan/Discussion:    Diuresis improving on current regimen but still with marked volume overload - at least 10 pounds to go. Continue current regimen.  If diuresis slows down may need a little dopamine support. I placed TED hose on RLE this am personally.   Length of Stay: 3  Jhoselyn Ruffini 04/24/2012, 8:11 AM

## 2012-04-25 LAB — BASIC METABOLIC PANEL
CO2: 32 mEq/L (ref 19–32)
Calcium: 9.8 mg/dL (ref 8.4–10.5)
Creatinine, Ser: 1.17 mg/dL — ABNORMAL HIGH (ref 0.50–1.10)
Glucose, Bld: 112 mg/dL — ABNORMAL HIGH (ref 70–99)
Sodium: 141 mEq/L (ref 135–145)

## 2012-04-25 LAB — GLUCOSE, CAPILLARY
Glucose-Capillary: 158 mg/dL — ABNORMAL HIGH (ref 70–99)
Glucose-Capillary: 307 mg/dL — ABNORMAL HIGH (ref 70–99)

## 2012-04-25 MED ORDER — POTASSIUM CHLORIDE CRYS ER 20 MEQ PO TBCR
40.0000 meq | EXTENDED_RELEASE_TABLET | Freq: Every day | ORAL | Status: DC
  Administered 2012-04-26 – 2012-04-28 (×3): 40 meq via ORAL
  Filled 2012-04-25 (×3): qty 2

## 2012-04-25 NOTE — Progress Notes (Signed)
Advanced Heart Failure Rounding Note   Subjective:     [name] is a 66 y.o. female with a history of morbid obesity complicated by obesity hypoventilation syndrome, hypertension, sleep apnea, diabetes, CRI and mild secondary pulmonary hypertension as well as hypokalemia. Carotid stenosis 40-59% bilaterally. Initially, she had an echocardiogram read as severe LV dysfunction with an EF of 15-20%. She underwent catheterization in 2008 that showed minimal nonobstructive coronary artery disease with EF 55%. She had mild pulmonary hypertension with both pulmonary venous and pulmonary arterial components. Her PVR was 5.1 Wood units. Echo in 3/09 EF 60%.   Admitted recently for 25-30 pound weight gain. Lasix increased and metolazone added as she was diuresing sluggishly.   Continues to diurese. Down another 3 pounds. Now 278. (Baseline 265-270) No orthopnea or PND. Renal function stable.   Being treated for UTI with cipro and doxy for foot ulcer.     Objective:   Weight Range:  Vital Signs:   Temp:  [97.3 F (36.3 C)-98.3 F (36.8 C)] 98.3 F (36.8 C) (10/07 0542) Pulse Rate:  [65-80] 72  (10/07 0542) Resp:  [18-19] 18  (10/07 0542) BP: (111-191)/(48-82) 111/48 mmHg (10/07 0542) SpO2:  [95 %-97 %] 97 % (10/07 0542) Weight:  [126.1 kg (278 lb)] 126.1 kg (278 lb) (10/07 0542) Last BM Date: 04/24/12  Weight change: Filed Weights   04/23/12 0541 04/24/12 0534 04/25/12 0542  Weight: 129.8 kg (286 lb 2.5 oz) 127.415 kg (280 lb 14.4 oz) 126.1 kg (278 lb)    Intake/Output:   Intake/Output Summary (Last 24 hours) at 04/25/12 1315 Last data filed at 04/25/12 1032  Gross per 24 hour  Intake    826 ml  Output   1450 ml  Net   -624 ml     Physical Exam:  General: Chronically ill appearing appearing. No resp difficulty  HEENT: normal  Neck: supple. JVP hard to see. Carotids 2+ bilaterally; no bruits. No lymphadenopathy or thryomegaly appreciated.  Cor: PMI normal. Regular rate & rhythm. No  rubs, gallops or murmurs.  Lungs: clear  Abdomen: obese, nontender, mildly distended. No hepatosplenomegaly. No bruits or masses. Good bowel sounds.  Extremities: 2+ firm edema. +erythema No cyanosis or clubbing R great toe wrapped  Neuro: alert & orientedx3, cranial nerves grossly intact. Moves all 4 extremities w/o difficulty. Affect pleasant.   Telemetry:  SR  Labs: Basic Metabolic Panel:  Lab 04/25/12 5621 04/24/12 0625 04/23/12 0600 04/22/12 0650 04/21/12 1448 04/21/12 1200  NA 141 140 142 140 -- 143  K 4.7 4.1 4.1 3.4* -- 3.3*  CL 100 100 103 102 -- 104  CO2 32 29 30 30  -- 30  GLUCOSE 112* 139* 115* 122* -- 121*  BUN 35* 30* 25* 27* -- 29*  CREATININE 1.17* 1.04 0.92 0.88 1.09 --  CALCIUM 9.8 9.6 9.7 -- -- --  MG -- -- -- -- -- --  PHOS -- -- -- -- -- --    Liver Function Tests: No results found for this basename: AST:5,ALT:5,ALKPHOS:5,BILITOT:5,PROT:5,ALBUMIN:5 in the last 168 hours No results found for this basename: LIPASE:5,AMYLASE:5 in the last 168 hours No results found for this basename: AMMONIA:3 in the last 168 hours  CBC:  Lab 04/21/12 1448  WBC 10.0  NEUTROABS --  HGB 12.0  HCT 36.0  MCV 89.3  PLT 163    Cardiac Enzymes: No results found for this basename: CKTOTAL:5,CKMB:5,CKMBINDEX:5,TROPONINI:5 in the last 168 hours  BNP: BNP (last 3 results)  Basename 04/21/12 1200  PROBNP  1055.0*     Other results  Imaging: No results found.   Medications:     Scheduled Medications:    . aspirin EC  325 mg Oral Daily  . carvedilol  3.125 mg Oral BID WC  . ciprofloxacin  250 mg Oral BID  . doxycycline  100 mg Oral Q12H  . enoxaparin  60 mg Subcutaneous Q24H  . furosemide  80 mg Intravenous Q8H  . insulin aspart  0-5 Units Subcutaneous QHS  . insulin aspart  0-9 Units Subcutaneous TID WC  . insulin glargine  42 Units Subcutaneous Daily  . insulin glargine  80 Units Subcutaneous QHS  . LORazepam  1 mg Oral BID  . metolazone  2.5 mg Oral  Daily  . potassium chloride  40 mEq Oral TID  . sertraline  100 mg Oral Daily  . simvastatin  40 mg Oral q1800  . sodium chloride  3 mL Intravenous Q12H  . spironolactone  25 mg Oral BID    Infusions:    PRN Medications: sodium chloride, acetaminophen, ondansetron (ZOFRAN) IV, sodium chloride   Assessment:   1. Acute on chronic diastolic heart failure, R>>L symptoms 2. Diabetic foot ulcer  3. DM  4. OSA with Bipap use  5. Morbid obesity 6. Hypokalemia 7. Cardiorenal syndrome 8. UTI   Plan/Discussion:    Volume status improving. Diuresing is slowing down. Another 7-8 pounds to go.  Renal function slightly worse.   Continue current diuretic regimen. Hopefully we can get her back to baseline weight.  Continue antibiotics UTI and foot wounds.   Consult PT.   Length of Stay: 4  Daniel Bensimhon 04/25/2012, 1:15 PM

## 2012-04-25 NOTE — Evaluation (Addendum)
Physical Therapy Evaluation Patient Details Name: [name] MRN: [id number] DOB: [birth year] Today's Date: 04/25/2012 Time: 1191-4782 PT Time Calculation (min): 22 min  PT Assessment / Plan / Recommendation Clinical Impression  Patient s/p UTI and CHF with decr mobliity secondary to decr endurance and decr balance.  Will benefit from PT to address these issues.  Recommend HHPT and HHOT f/u to assess home environment given that patient has had falls.      PT Assessment  Patient needs continued PT services    Follow Up Recommendations  Home health PT;Supervision/Assistance - 24 hour    Does the patient have the potential to tolerate intense rehabilitation      Barriers to Discharge        Equipment Recommendations  None recommended by PT    Recommendations for Other Services     Frequency Min 3X/week    Precautions / Restrictions Precautions Precautions: Fall Restrictions Weight Bearing Restrictions: No   Pertinent Vitals/Pain VSS, No pain      Mobility  Bed Mobility Bed Mobility: Sit to Supine Sit to Supine: 7: Independent Details for Bed Mobility Assistance: Took incr time for patient to get her LES on the bed. Transfers Transfers: Sit to Stand;Stand to Sit Sit to Stand: 5: Supervision Stand to Sit: 5: Supervision Ambulation/Gait Ambulation/Gait Assistance: 5: Supervision Ambulation Distance (Feet): 10 Feet Assistive device: None Ambulation/Gait Assistance Details: Pt too fatigued to go out in hall per pt.  Pt. with good balance in controlled environment.  Will test further next visit.  Pt. states she is not at her baseline secondary to fatigues more quickly than usual.   Gait Pattern: Step-through pattern;Decreased stride length;Wide base of support Gait velocity: decreased Stairs: No Wheelchair Mobility Wheelchair Mobility: No         Exercises:  Instructed pt. In standing exercise program and gave handout.     PT Diagnosis: Generalized weakness    PT Problem List: Decreased activity tolerance;Decreased balance;Decreased mobility;Decreased safety awareness;Decreased knowledge of use of DME PT Treatment Interventions: DME instruction;Gait training;Stair training;Functional mobility training;Therapeutic activities;Therapeutic exercise;Balance training;Patient/family education   PT Goals Acute Rehab PT Goals PT Goal Formulation: With patient Time For Goal Achievement: 05/09/12 Potential to Achieve Goals: Good Pt will go Supine/Side to Sit: Independently PT Goal: Supine/Side to Sit - Progress: Goal set today Pt will go Sit to Stand: Independently;with upper extremity assist PT Goal: Sit to Stand - Progress: Goal set today Pt will Ambulate: 51 - 150 feet;with supervision;with least restrictive assistive device PT Goal: Ambulate - Progress: Goal set today Pt will Go Up / Down Stairs: 1-2 stairs;with supervision;with least restrictive assistive device PT Goal: Up/Down Stairs - Progress: Goal set today  Visit Information  Last PT Received On: 04/25/12 Assistance Needed: +1    Subjective Data  Subjective: "I am weaker than I used to be." Patient Stated Goal: To go home.   Prior Functioning  Home Living Lives With: Spouse Available Help at Discharge: Family;Available 24 hours/day Type of Home: House Home Access: Stairs to enter [employer] of Steps: 1 Entrance Stairs-Rails: None Home Layout: One level Bathroom Shower/Tub: Health visitor: Standard Bathroom Accessibility: Yes How Accessible: Accessible via walker Home Adaptive Equipment: Grab bars in shower;Hand-held shower hose;Walker - rolling;Walker - four wheeled;Straight cane;Bedside commode/3-in-1;Wheelchair - manual;Shower chair with back Additional Comments: Pt reports she had the shower updated that is the enclosure however they did not install it correctly.  They are in litigation trying to get the company to come  and fix it.  She uses a walk in  shower but has fallen 3 x getting out of it.   Prior Function Level of Independence: Independent with assistive device(s);Needs assistance Needs Assistance: Meal Prep;Light Housekeeping Meal Prep: Minimal Light Housekeeping: Total Able to Take Stairs?: Yes Driving: No Vocation: Retired Musician: No difficulties    Cognition  Overall Cognitive Status: Appears within functional limits for tasks assessed/performed Arousal/Alertness: Awake/alert Orientation Level: Appears intact for tasks assessed Behavior During Session: [facility] for tasks performed    Extremity/Trunk Assessment Right Lower Extremity Assessment RLE ROM/Strength/Tone: [facility] for tasks assessed Left Lower Extremity Assessment LLE ROM/Strength/Tone: [facility] for tasks assessed   Balance Static Standing Balance Static Standing - Balance Support: During functional activity;No upper extremity supported Static Standing - Level of Assistance: 5: Stand by assistance Static Standing - Comment/# of Minutes: 1 minute without need for physical assist  End of Session PT - End of Session Equipment Utilized During Treatment: Gait belt Activity Tolerance: Patient tolerated treatment well Patient left: in bed;with call bell/phone within reach;with family/visitor present Nurse Communication: Mobility status       INGOLD,Muhanad Torosyan 04/25/2012, 1:22 PM  [facility] Acute Rehabilitation [phone] (pager)

## 2012-04-25 NOTE — Progress Notes (Signed)
[facility]  NAME:  ESTEL, TONELLI NO.:  [id number]  MEDICAL RECORD NO.:  [id number]      DATE OF BIRTH:  24-Jun-1946  PHYSICIAN:  Ardath Sax, M.D.           VISIT DATE:                                  OFFICE VISIT   This is a 66 year old obese diabetic with a history of hypertension and COPD.  She has a chronic ulcer of her left great toe that which had a real hard time healing. 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  We will probably go ahead and plan to put an Oasis on, but I would like to get her approved for hyperbaric oxygen treatments as she is an obese diabetic with osteo in the left great toe and she has gone through a cardiac workup so that she would tolerate hyperbaric oxygen treatments, so we will write for approval and hopefully start hyperbaric oxygen this week.     Ardath Sax, M.D.     PP/MEDQ  D:  04/25/2012  T:  04/25/2012  Job:  [id number]

## 2012-04-25 NOTE — Consult Note (Signed)
WOC consult Note Reason for Consult: eval. Left great toe wound.  Pt reports she has been followed by Dr. Leanord Hawking at the [facility] for a few weeks and that they are doing serial debridments, ABX tx. And topical care.  She reports "bone infection" but says it has been resolved with abtx.   Wound type:DM foot ulcer left great toe Measurement:0.5cm x 0.5cm x 0 Wound bed: no open wound at the time of my assessment today, skin dry and intact Drainage (amount, consistency, odor) scant bloody drainage on dressing, pt reports this was after her last debridment Periwound:hyperkeratotic Dressing procedure/placement/frequency: dry dressing only, it does appear she may have had small pc. Of collagen dressing in place at the time of my removal of the old dressing but we will not have access to this dressing while in the hospital, since wound appears closed and is not actively draining I will only cover for protection while here. She is agreeable to that plan.  Re consult if needed, will not follow at this time. Thanks  Rosabell Geyer [employer], CWOCN [phone])

## 2012-04-26 LAB — BASIC METABOLIC PANEL
Calcium: 9.5 mg/dL (ref 8.4–10.5)
GFR calc non Af Amer: 48 mL/min — ABNORMAL LOW (ref >90)
Sodium: 139 mEq/L (ref 135–145)

## 2012-04-26 LAB — GLUCOSE, CAPILLARY
Glucose-Capillary: 138 mg/dL — ABNORMAL HIGH (ref 70–99)
Glucose-Capillary: 355 mg/dL — ABNORMAL HIGH (ref 70–99)

## 2012-04-26 NOTE — Progress Notes (Signed)
Advanced Heart Failure Rounding Note   Subjective:     Burry is a 66 y.o. female with a history of morbid obesity complicated by obesity hypoventilation syndrome, hypertension, sleep apnea, diabetes, CRI and mild secondary pulmonary hypertension as well as hypokalemia. Carotid stenosis 40-59% bilaterally. Initially, she had an echocardiogram read as severe LV dysfunction with an EF of 15-20%. She underwent catheterization in 2008 that showed minimal nonobstructive coronary artery disease with EF 55%. She had mild pulmonary hypertension with both pulmonary venous and pulmonary arterial components. Her PVR was 5.1 Wood units. Echo in 3/09 EF 60%.   Admitted recently for 25-30 pound weight gain. Lasix increased and metolazone added as she was diuresing sluggishly.   Continues to diurese. Down another 3 pounds. Now 275. (Baseline 265-270) No orthopnea or PND. Renal function stable. Breathing fine. Ambulating room.   Being treated for UTI with cipro and doxy for foot ulcer.     Objective:   Weight Range:  Vital Signs:   Temp:  [97.5 F (36.4 C)-97.7 F (36.5 C)] 97.5 F (36.4 C) (10/08 0432) Pulse Rate:  [65-81] 81  (10/08 0432) Resp:  [18-19] 19  (10/08 0432) BP: (140-184)/(50-71) 147/70 mmHg (10/08 0432) SpO2:  [96 %-98 %] 97 % (10/08 0432) Weight:  [124.8 kg (275 lb 2.2 oz)] 124.8 kg (275 lb 2.2 oz) (10/08 0432) Last BM Date: 04/25/12  Weight change: Filed Weights   04/24/12 0534 04/25/12 0542 04/26/12 0432  Weight: 127.415 kg (280 lb 14.4 oz) 126.1 kg (278 lb) 124.8 kg (275 lb 2.2 oz)    Intake/Output:   Intake/Output Summary (Last 24 hours) at 04/26/12 0836 Last data filed at 04/26/12 0448  Gross per 24 hour  Intake    963 ml  Output   2025 ml  Net  -1062 ml     Physical Exam:  General: Chronically ill appearing appearing. No resp difficulty  HEENT: normal  Neck: supple. JVP hard to see. Carotids 2+ bilaterally; no bruits. No lymphadenopathy or thryomegaly  appreciated.  Cor: PMI normal. Regular rate & rhythm. No rubs, gallops or murmurs.  Lungs: clear  Abdomen: obese, nontender, mildly distended. No hepatosplenomegaly. No bruits or masses. Good bowel sounds.  Extremities: 2+ firm edema. +erythema No cyanosis or clubbing R great toe wrapped  Neuro: alert & orientedx3, cranial nerves grossly intact. Moves all 4 extremities w/o difficulty. Affect pleasant.   Telemetry:  SR  Labs: Basic Metabolic Panel:  Lab 04/26/12 4034 04/25/12 0510 04/24/12 0625 04/23/12 0600 04/22/12 0650  NA 139 141 140 142 140  K 4.1 4.7 4.1 4.1 3.4*  CL 99 100 100 103 102  CO2 31 32 29 30 30   GLUCOSE 148* 112* 139* 115* 122*  BUN 38* 35* 30* 25* 27*  CREATININE 1.15* 1.17* 1.04 0.92 0.88  CALCIUM 9.5 9.8 9.6 -- --  MG -- -- -- -- --  PHOS -- -- -- -- --    Liver Function Tests: No results found for this basename: AST:5,ALT:5,ALKPHOS:5,BILITOT:5,PROT:5,ALBUMIN:5 in the last 168 hours No results found for this basename: LIPASE:5,AMYLASE:5 in the last 168 hours No results found for this basename: AMMONIA:3 in the last 168 hours  CBC:  Lab 04/21/12 1448  WBC 10.0  NEUTROABS --  HGB 12.0  HCT 36.0  MCV 89.3  PLT 163    Cardiac Enzymes: No results found for this basename: CKTOTAL:5,CKMB:5,CKMBINDEX:5,TROPONINI:5 in the last 168 hours  BNP: BNP (last 3 results)  Basename 04/21/12 1200  PROBNP 1055.0*  Other results  Imaging: No results found.   Medications:     Scheduled Medications:    . aspirin EC  325 mg Oral Daily  . carvedilol  3.125 mg Oral BID WC  . ciprofloxacin  250 mg Oral BID  . doxycycline  100 mg Oral Q12H  . enoxaparin  60 mg Subcutaneous Q24H  . furosemide  80 mg Intravenous Q8H  . insulin aspart  0-5 Units Subcutaneous QHS  . insulin aspart  0-9 Units Subcutaneous TID WC  . insulin glargine  42 Units Subcutaneous Daily  . insulin glargine  80 Units Subcutaneous QHS  . LORazepam  1 mg Oral BID  . metolazone  2.5  mg Oral Daily  . potassium chloride  40 mEq Oral Daily  . sertraline  100 mg Oral Daily  . simvastatin  40 mg Oral q1800  . sodium chloride  3 mL Intravenous Q12H  . spironolactone  25 mg Oral BID  . DISCONTD: potassium chloride  40 mEq Oral TID    Infusions:    PRN Medications: sodium chloride, acetaminophen, ondansetron (ZOFRAN) IV, sodium chloride   Assessment:   1. Acute on chronic diastolic heart failure, R>>L symptoms 2. Diabetic foot ulcer  3. DM  4. OSA with Bipap use  5. Morbid obesity 6. Hypokalemia 7. Cardiorenal syndrome 8. UTI   Plan/Discussion:    Volume status improving. Continues to diurese. Renal function stable. Another 5 pounds to go.   Continue current diuretic regimen. Hopefully we can get her back to baseline weight. Home in 1-2 days. Need to get her as dry as possible.  Continue antibiotics UTI and foot wounds.   Consult PT.   Length of Stay: 5  Tyre Beaver 04/26/2012, 8:36 AM

## 2012-04-26 NOTE — Care Management Note (Unsigned)
    Page 1 of 1   04/26/2012     10:41:31 AM   CARE MANAGEMENT NOTE 04/26/2012  Patient:  [name], [name]   Account Number:  [id number]  Date Initiated:  04/26/2012  Documentation initiated by:  CRAFT,TERRI  Subjective/Objective Assessment:   66 yo female admitted 04/21/2012 with CHF/UTI     Action/Plan:   D/C when medically stable   Anticipated DC Date:  04/29/2012   Anticipated DC Plan:  HOME W HOME HEALTH SERVICES      DC Planning Services  CM consult      [facility] Choice  HOME HEALTH   Choice offered to / List presented to:  C-1 Patient        HH arranged  HH-1 RN  HH-2 PT      [facility] agency  [facility]   Status of service:  In process, will continue to follow  Discharge Disposition:  HOME W HOME HEALTH SERVICES  Comments:  04/26/12, Kathi Der RNC-MNN, BSN, [phone], CM received referral.  CM met with pt to offer choice for [facility] services. Pt states she wants to use [facility].  Marie at [facility] called with order and confirmation of services received.

## 2012-04-26 NOTE — Progress Notes (Signed)
Pt was placed on CPAP at 00:26. Settings were Auto 5-12 cmH2O. Vitals HR 76. RR 20. Oxygen saturation 96%. Pt is comfortable. Will continue to monitor.

## 2012-04-26 NOTE — Progress Notes (Signed)
Physical Therapy Treatment and D/C note Patient Details Name: [name] MRN: [id number] DOB: 1946/04/25 Today's Date: 04/26/2012 Time: [id number] PT Time Calculation (min): 9 min  PT Assessment / Plan / Recommendation Comments on Treatment Session  Patient s/p UTI with decr endurance.  Pt. feels she is back to her baseline and feels much better today.  Performed exercise program with independence.  Do not feel pt needs any more PT in the hospital but will benefit from HHPT and HHOT safety evaluations to evaluate safety in the home.      Follow Up Recommendations  Home health PT;Supervision/Assistance - 24 hour     Does the patient have the potential to tolerate intense rehabilitation     Barriers to Discharge        Equipment Recommendations  None recommended by PT    Recommendations for Other Services    Frequency     Plan Discharge plan remains appropriate;All goals met and education completed, patient dischaged from PT services    Precautions / Restrictions Precautions Precautions: None Restrictions Weight Bearing Restrictions: No   Pertinent Vitals/Pain VSS, No pain    Mobility  Bed Mobility Bed Mobility: Not assessed Transfers Transfers: Not assessed Ambulation/Gait Ambulation/Gait Assistance: 7: Independent Ambulation Distance (Feet): 25 Feet Assistive device: None Ambulation/Gait Assistance Details: Patient up in room ambulating independently.  No LOB.  Pt. reports that she is back to baseline. Gait Pattern: Step-through pattern;Decreased stride length;Wide base of support Gait velocity: decr Stairs: No Wheelchair Mobility Wheelchair Mobility: No    Exercises Other Exercises Other Exercises: Pt. performed standing hip flexion, hip extension, hip abduction and marching in place x 15 reps     PT Goals Acute Rehab PT Goals PT Goal: Supine/Side to Sit - Progress: Met PT Goal: Sit to Stand - Progress: Met PT Goal: Ambulate - Progress: Met PT Goal:  Up/Down Stairs - Progress:  (pt declined to practice)  Visit Information  Last PT Received On: 04/26/12 Assistance Needed: +1    Subjective Data  Subjective: "I am doing well.  I did my exercises earlier today."   Cognition  Overall Cognitive Status: Appears within functional limits for tasks assessed/performed Arousal/Alertness: Awake/alert Orientation Level: Appears intact for tasks assessed Behavior During Session: [facility] for tasks performed    Balance  Static Standing Balance Static Standing - Balance Support: No upper extremity supported;During functional activity Static Standing - Level of Assistance: 7: Independent Static Standing - Comment/# of Minutes: 1 minute Single Leg Stance - Right Leg: 8  Tandem Stance - Right Leg: 25   End of Session PT - End of Session Equipment Utilized During Treatment: Gait belt Activity Tolerance: Patient tolerated treatment well Patient left: in chair;with call bell/phone within reach Nurse Communication: Mobility status        INGOLD,Joshuwa Vecchio 04/26/2012, 12:58 PM  [facility] [phone] (pager)

## 2012-04-26 NOTE — Progress Notes (Signed)
[facility]  Patient Status: New  [facility] is providing the following services: RN and PT with anticipated d/c on 10/9 per CM.  If patient discharges after hours, please call [phone].   Wynelle Bourgeois 04/26/2012, 11:07 AM

## 2012-04-26 NOTE — Progress Notes (Deleted)
[facility]  Patient Status: New  [facility] is providing the following services: RN and PT for anticipated d/c tomorrow.  If patient discharges after hours, please call [phone].   [name] 04/26/2012, 12:26 PM

## 2012-04-27 DIAGNOSIS — N189 Chronic kidney disease, unspecified: Secondary | ICD-10-CM

## 2012-04-27 LAB — BASIC METABOLIC PANEL
BUN: 44 mg/dL — ABNORMAL HIGH (ref 6–23)
CO2: 31 mEq/L (ref 19–32)
Chloride: 98 mEq/L (ref 96–112)
Creatinine, Ser: 1.24 mg/dL — ABNORMAL HIGH (ref 0.50–1.10)
Potassium: 3.6 mEq/L (ref 3.5–5.1)

## 2012-04-27 LAB — GLUCOSE, CAPILLARY: Glucose-Capillary: 173 mg/dL — ABNORMAL HIGH (ref 70–99)

## 2012-04-27 MED ORDER — TORSEMIDE 20 MG PO TABS
20.0000 mg | ORAL_TABLET | Freq: Two times a day (BID) | ORAL | Status: DC
  Administered 2012-04-27 – 2012-04-28 (×3): 20 mg via ORAL
  Filled 2012-04-27 (×5): qty 1

## 2012-04-27 NOTE — Progress Notes (Signed)
Inpatient Diabetes Program Recommendations  AACE/ADA: New Consensus Statement on Inpatient Glycemic Control (2013)  Target Ranges:  Prepandial:   less than 140 mg/dL      Peak postprandial:   less than 180 mg/dL (1-2 hours)      Critically ill patients:  140 - 180 mg/dL   Reason for Visit: CBGs 04-26-12  [phone] mg/dl    03-27-10   914 mg/dl  Inpatient Diabetes Program Recommendations Insulin - Meal Coverage: Start Novolog 4 or 5 units  TID with meals if CBGs continue greater than 180 mg/dl  Note:

## 2012-04-27 NOTE — Progress Notes (Signed)
Patient had 9 beats of VTACH, Patient asymptomatic.  MD Adolm Joseph) aware and ordered Mag lab for the morning. Patient in no acute distress. RN will continue to monitor. Louretta Parma, RN

## 2012-04-27 NOTE — Progress Notes (Signed)
Advanced Heart Failure Rounding Note   Subjective:     Ramberg is a 66 y.o. female with a history of morbid obesity complicated by obesity hypoventilation syndrome, hypertension, sleep apnea, diabetes, CRI and mild secondary pulmonary hypertension as well as hypokalemia. Carotid stenosis 40-59% bilaterally. Initially, she had an echocardiogram read as severe LV dysfunction with an EF of 15-20%. She underwent catheterization in 2008 that showed minimal nonobstructive coronary artery disease with EF 55%. She had mild pulmonary hypertension with both pulmonary venous and pulmonary arterial components. Her PVR was 5.1 Wood units. Echo in 3/09 EF 60%.   Admitted recently for 25-30 pound weight gain. Lasix increased and metolazone added as she was diuresing sluggishly.   Continues to diurese. Down another 3 pounds. Now 272.  (Baseline 265-270) No orthopnea or PND. Renal function trending up. Breathing fine. Ambulating room.   Being treated for UTI with cipro and doxy for foot ulcer.     Objective:   Weight Range:  Vital Signs:   Temp:  [97.1 F (36.2 C)-97.8 F (36.6 C)] 97.8 F (36.6 C) (10/09 0520) Pulse Rate:  [66-77] 77  (10/09 0520) Resp:  [18-20] 19  (10/09 0520) BP: (123-157)/(42-81) 123/42 mmHg (10/09 0520) SpO2:  [94 %-95 %] 94 % (10/09 0520) Weight:  [123.5 kg (272 lb 4.3 oz)] 123.5 kg (272 lb 4.3 oz) (10/09 0520) Last BM Date: 04/26/12  Weight change: Filed Weights   04/25/12 0542 04/26/12 0432 04/27/12 0520  Weight: 126.1 kg (278 lb) 124.8 kg (275 lb 2.2 oz) 123.5 kg (272 lb 4.3 oz)    Intake/Output:   Intake/Output Summary (Last 24 hours) at 04/27/12 0831 Last data filed at 04/27/12 0552  Gross per 24 hour  Intake   1160 ml  Output   1700 ml  Net   -540 ml     Physical Exam:  General: Chronically ill appearing appearing. No resp difficulty  HEENT: normal  Neck: supple. JVP hard to see. Carotids 2+ bilaterally; no bruits. No lymphadenopathy or thryomegaly  appreciated.  Cor: PMI normal. Regular rate & rhythm. No rubs, gallops or murmurs.  Lungs: clear  Abdomen: obese, nontender, mildly distended. No hepatosplenomegaly. No bruits or masses. Good bowel sounds.  Extremities: 2+ firm edema. +erythema No cyanosis or clubbing R great toe wrapped  Neuro: alert & orientedx3, cranial nerves grossly intact. Moves all 4 extremities w/o difficulty. Affect pleasant.   Telemetry:  SR  Labs: Basic Metabolic Panel:  Lab 04/27/12 1610 04/26/12 0615 04/25/12 0510 04/24/12 0625 04/23/12 0600  NA 138 139 141 140 142  K 3.6 4.1 4.7 4.1 4.1  CL 98 99 100 100 103  CO2 31 31 32 29 30  GLUCOSE 165* 148* 112* 139* 115*  BUN 44* 38* 35* 30* 25*  CREATININE 1.24* 1.15* 1.17* 1.04 0.92  CALCIUM 9.3 9.5 9.8 -- --  MG -- -- -- -- --  PHOS -- -- -- -- --    Liver Function Tests: No results found for this basename: AST:5,ALT:5,ALKPHOS:5,BILITOT:5,PROT:5,ALBUMIN:5 in the last 168 hours No results found for this basename: LIPASE:5,AMYLASE:5 in the last 168 hours No results found for this basename: AMMONIA:3 in the last 168 hours  CBC:  Lab 04/21/12 1448  WBC 10.0  NEUTROABS --  HGB 12.0  HCT 36.0  MCV 89.3  PLT 163    Cardiac Enzymes: No results found for this basename: CKTOTAL:5,CKMB:5,CKMBINDEX:5,TROPONINI:5 in the last 168 hours  BNP: BNP (last 3 results)  Basename 04/21/12 1200  PROBNP 1055.0*  Other results  Imaging: No results found.   Medications:     Scheduled Medications:    . aspirin EC  325 mg Oral Daily  . carvedilol  3.125 mg Oral BID WC  . ciprofloxacin  250 mg Oral BID  . doxycycline  100 mg Oral Q12H  . enoxaparin  60 mg Subcutaneous Q24H  . furosemide  80 mg Intravenous Q8H  . insulin aspart  0-5 Units Subcutaneous QHS  . insulin aspart  0-9 Units Subcutaneous TID WC  . insulin glargine  42 Units Subcutaneous Daily  . insulin glargine  80 Units Subcutaneous QHS  . LORazepam  1 mg Oral BID  . metolazone  2.5  mg Oral Daily  . potassium chloride  40 mEq Oral Daily  . sertraline  100 mg Oral Daily  . simvastatin  40 mg Oral q1800  . sodium chloride  3 mL Intravenous Q12H  . spironolactone  25 mg Oral BID    Infusions:    PRN Medications: sodium chloride, acetaminophen, ondansetron (ZOFRAN) IV, sodium chloride   Assessment:   1. Acute on chronic diastolic heart failure, R>>L symptoms 2. Diabetic foot ulcer  3. DM  4. OSA with Bipap use  5. Morbid obesity 6. Hypokalemia 7. Cardiorenal syndrome 8. UTI 9. Acute on chronic renal failure  Plan/Discussion:    Volume status improving.   Weigh down 19 pounds. Renal function trending up. Stop IV lasix and metolazone. Resume demadex 20 bid. Probably home in am.   Continue antibiotics UTI and foot wounds.   PT consult appreciated. HHPT/RN ordered for D/C.  Length of Stay: 6  Daniel Bensimhon,MD 10:06 AM

## 2012-04-27 NOTE — Progress Notes (Signed)
Pt placed herself on cpap & resting comfortably at this time. Settings 5-12 cmH2O auto titration mode.  Jacqulynn Cadet RRT

## 2012-04-28 DIAGNOSIS — I5033 Acute on chronic diastolic (congestive) heart failure: Secondary | ICD-10-CM

## 2012-04-28 LAB — BASIC METABOLIC PANEL
CO2: 32 mEq/L (ref 19–32)
Calcium: 9.3 mg/dL (ref 8.4–10.5)
GFR calc Af Amer: 49 mL/min — ABNORMAL LOW (ref >90)
GFR calc non Af Amer: 42 mL/min — ABNORMAL LOW (ref >90)
Sodium: 136 mEq/L (ref 135–145)

## 2012-04-28 LAB — GLUCOSE, CAPILLARY
Glucose-Capillary: 303 mg/dL — ABNORMAL HIGH (ref 70–99)
Glucose-Capillary: 250 mg/dL — ABNORMAL HIGH (ref 70–99)

## 2012-04-28 NOTE — Progress Notes (Signed)
Advanced Heart Failure Rounding Note   Subjective:     [name] is a 66 y.o. female with a history of morbid obesity complicated by obesity hypoventilation syndrome, hypertension, sleep apnea, diabetes, CRI and mild secondary pulmonary hypertension as well as hypokalemia. Carotid stenosis 40-59% bilaterally. Initially, she had an echocardiogram read as severe LV dysfunction with an EF of 15-20%. She underwent catheterization in 2008 that showed minimal nonobstructive coronary artery disease with EF 55%. She had mild pulmonary hypertension with both pulmonary venous and pulmonary arterial components. Her PVR was 5.1 Wood units. Echo in 3/09 EF 60%.   Admitted recently for 25-30 pound weight gain. Yesterday IV lasix stopped and torsemide started.   Continues to diurese. Down another 3 pounds. Now 269 pounds. .  (Baseline 265-270) No orthopnea or PND. Renal function trending up. Breathing fine. Ambulating room.   Admit ProBNP 1050  Discharge ProBNP 331  Being treated for UTI with cipro and doxy for foot ulcer.     Objective:   Weight Range:  Vital Signs:   Temp:  [97.5 F (36.4 C)-98 F (36.7 C)] 97.5 F (36.4 C) (10/10 0532) Pulse Rate:  [71-76] 72  (10/10 0532) Resp:  [18-19] 18  (10/09 2320) BP: (152-154)/(70-71) 152/71 mmHg (10/10 0532) SpO2:  [93 %-96 %] 93 % (10/10 0532) FiO2 (%):  [21 %] 21 % (10/09 2320) Weight:  [122.4 kg (269 lb 13.5 oz)] 122.4 kg (269 lb 13.5 oz) (10/10 0532) Last BM Date: 04/27/12  Weight change: Filed Weights   04/26/12 0432 04/27/12 0520 04/28/12 0532  Weight: 124.8 kg (275 lb 2.2 oz) 123.5 kg (272 lb 4.3 oz) 122.4 kg (269 lb 13.5 oz)    Intake/Output:   Intake/Output Summary (Last 24 hours) at 04/28/12 2017 Last data filed at 04/28/12 0802  Gross per 24 hour  Intake    480 ml  Output   1500 ml  Net  -1020 ml     Physical Exam:  General: Chronically ill appearing appearing. No resp difficulty  HEENT: normal  Neck: supple. JVP hard to see.  Carotids 2+ bilaterally; no bruits. No lymphadenopathy or thryomegaly appreciated.  Cor: PMI normal. Regular rate & rhythm. No rubs, gallops or murmurs.  Lungs: clear  Abdomen: obese, nontender, mildly distended. No hepatosplenomegaly. No bruits or masses. Good bowel sounds.  Extremities:  +erythema No cyanosis or clubbing R great toe wrapped  Neuro: alert & orientedx3, cranial nerves grossly intact. Moves all 4 extremities w/o difficulty. Affect pleasant.   Telemetry:  SR  Labs: Basic Metabolic Panel:  Lab 04/28/12 9811 04/27/12 9147 04/26/12 0615 04/25/12 0510 04/24/12 0625  NA 136 138 139 141 140  K 3.8 3.6 4.1 4.7 4.1  CL 96 98 99 100 100  CO2 32 31 31 32 29  GLUCOSE 258* 165* 148* 112* 139*  BUN 50* 44* 38* 35* 30*  CREATININE 1.29* 1.24* 1.15* 1.17* 1.04  CALCIUM 9.3 9.3 9.5 -- --  MG 2.0 -- -- -- --  PHOS -- -- -- -- --    Liver Function Tests: No results found for this basename: AST:5,ALT:5,ALKPHOS:5,BILITOT:5,PROT:5,ALBUMIN:5 in the last 168 hours No results found for this basename: LIPASE:5,AMYLASE:5 in the last 168 hours No results found for this basename: AMMONIA:3 in the last 168 hours  CBC: No results found for this basename: WBC:5,NEUTROABS:5,HGB:5,HCT:5,MCV:5,PLT:5 in the last 168 hours  Cardiac Enzymes: No results found for this basename: CKTOTAL:5,CKMB:5,CKMBINDEX:5,TROPONINI:5 in the last 168 hours  BNP: BNP (last 3 results)  Basename 04/28/12 0615 04/21/12  1200  PROBNP 331.5* 1055.0*     Other results  Imaging: No results found.   Medications:     Scheduled Medications:    . DISCONTD: aspirin EC  325 mg Oral Daily  . DISCONTD: carvedilol  3.125 mg Oral BID WC  . DISCONTD: ciprofloxacin  250 mg Oral BID  . DISCONTD: doxycycline  100 mg Oral Q12H  . DISCONTD: enoxaparin  60 mg Subcutaneous Q24H  . DISCONTD: insulin aspart  0-5 Units Subcutaneous QHS  . DISCONTD: insulin aspart  0-9 Units Subcutaneous TID WC  . DISCONTD: insulin  glargine  42 Units Subcutaneous Daily  . DISCONTD: insulin glargine  80 Units Subcutaneous QHS  . DISCONTD: LORazepam  1 mg Oral BID  . DISCONTD: potassium chloride  40 mEq Oral Daily  . DISCONTD: sertraline  100 mg Oral Daily  . DISCONTD: simvastatin  40 mg Oral q1800  . DISCONTD: sodium chloride  3 mL Intravenous Q12H  . DISCONTD: spironolactone  25 mg Oral BID  . DISCONTD: torsemide  20 mg Oral BID    Infusions:    PRN Medications: DISCONTD: sodium chloride, DISCONTD: acetaminophen, DISCONTD: ondansetron (ZOFRAN) IV, DISCONTD: sodium chloride   Assessment:   1. Acute on chronic diastolic heart failure, R>>L symptoms 2. Diabetic foot ulcer  3. DM  4. OSA with Bipap use  5. Morbid obesity 6. Hypokalemia 7. Cardiorenal syndrome 8. UTI 9. Acute on chronic renal failure  Plan/Discussion:    Volume status improved.   Weight down 22 pounds - at baseline. Renal function trending up. Resume demadex 20 bid. D/C home today. Check BMET at office visit next week.     PT consult appreciated. HHPT/RN ordered for D/C.  Length of Stay: 7

## 2012-04-28 NOTE — Discharge Summary (Signed)
Advanced Heart Failure Team  Discharge Summary   Patient ID: [name] MRN: [id number], DOB/AGE: [birth year] 66 y.o. Admit date: 04/21/2012 D/C date:     04/28/2012   Primary Discharge Diagnoses:  1. Acute on chronic diastolic heart failure, R>>L symptoms  2. Diabetic foot ulcer  3. DM  4. OSA with Bipap use  5. Morbid obesity  6. Hypokalemia  7. Cardiorenal syndrome  8. UTI  9. Acute on chronic renal failure   Hospital Course:  [name] is a 66 y.o. female with a history of morbid obesity complicated by obesity hypoventilation syndrome, hypertension, sleep apnea, diabetes, CRI and mild secondary pulmonary hypertension as well as hypokalemia. Carotid stenosis 40-59% bilaterally. Initially, she had an echocardiogram read as severe LV dysfunction with an EF of 15-20%. She underwent catheterization in 2008 that showed minimal nonobstructive coronary artery disease with EF 55%. She had mild pulmonary hypertension with both pulmonary venous and pulmonary arterial components. Her PVR was 5.1 Wood units. Echo in 3/09 EF 60%. Echo EF 50%.   [name] was admitted 04/21/12 from the heart failure clinic due to volume overload and increased dyspnea. Prior to office visit she was unable to weigh due to a balance problem and her was elevated 25 pounds from her baseline 270 pounds. Pertinent admission labs include pro bnp 1055, creatinine 1.09, and WBC 10. She was diuresed with IV lasix and transitioned to her home diuretic regimen which is Torsemide 20 mg twice a day. Diuresed 21 pounds. Renal function was followed closely with bump in creatinine noted. Evaluated by PT with recommendations for home PT.  Discharge weight 269 pounds  U/A revealed numerous bacteria. UTI was treated with 7 days of Cipro. She remained on Doxycycline for chronic foot wound. She [facility] as she was doing prior to admit. Topical wound care was continued on left great  toe with dry dressings to  preserve collagen dressing in place.   Lengthy  discussions occurred regarding daily weights, low salt food choices, and limiting fluid intake to < 2 liters per day.    She is stable for discharge home with close follow up in the HF clinic and [facility] for disease management and PT. Check BMET next week at clinic visit.     Admit ProBNP 1050   Discharge ProBNP 331    Discharge Weight Range:  Admit Weight: 291 pounds   Discharge Weight: 269 pounds Discharge Vitals: Blood pressure 152/71, pulse 72, temperature 97.5 F (36.4 C), temperature source Oral, resp. rate 18, height 5\' 1"  (1.549 m), weight 122.4 kg (269 lb 13.5 oz), SpO2 93.00%.  Labs: Lab Results  Component Value Date   WBC 10.0 04/21/2012   HGB 12.0 04/21/2012   HCT 36.0 04/21/2012   MCV 89.3 04/21/2012   PLT 163 04/21/2012     Lab 04/28/12 0615  NA 136  K 3.8  CL 96  CO2 32  BUN 50*  CREATININE 1.29*  CALCIUM 9.3  PROT --  BILITOT --  ALKPHOS --  ALT --  AST --  GLUCOSE 258*   Lab Results  Component Value Date   CHOL 142 12/31/2008   HDL 47.60 12/31/2008   LDLCALC 77 12/31/2008   TRIG 89.0 12/31/2008   BNP (last 3 results)  Basename 04/28/12 0615 04/21/12 1200  PROBNP 331.5* 1055.0*    Diagnostic Studies/Procedures   No results found.  Discharge Medications     Medication List     As of 04/28/2012  8:18 PM    TAKE these medications         APIDRA 100 UNIT/ML injection   Generic drug: insulin glulisine   Inject 10 Units into the skin 3 (three) times daily before meals.      BAYER ASPIRIN 325 MG tablet   Generic drug: aspirin   Take 325 mg by mouth daily.      carvedilol 3.125 MG tablet   Commonly known as: COREG   Take 3.125 mg by mouth 2 (two) times daily with a meal.      cyanocobalamin 1000 MCG/ML injection   Commonly known as: (VITAMIN B-12)   Inject 1,000 mcg into the muscle every 30 (thirty) days. Pt states last dose at end of August      doxycycline 100 MG capsule   Commonly known  as: VIBRAMYCIN   Take 100 mg by mouth 2 (two) times daily. For infection in toe. Patient states she has taken for 4 weeks and has 2 more weeks to complete.      insulin glargine 100 UNIT/ML injection   Commonly known as: LANTUS   Inject 42-80 Units into the skin 2 (two) times daily. Pt uses 42 units in AM and 80 units in PM      LORazepam 1 MG tablet   Commonly known as: ATIVAN   Take 1 mg by mouth 2 (two) times daily.      sertraline 100 MG tablet   Commonly known as: ZOLOFT   Take 50 mg by mouth daily. Take 1/2 tab by mouth once daily      simvastatin 40 MG tablet   Commonly known as: ZOCOR   Take 1 tablet (40 mg total) by mouth at bedtime.      spironolactone 25 MG tablet   Commonly known as: ALDACTONE   Take 1 tablet (25 mg total) by mouth 2 (two) times daily.      torsemide 20 MG tablet   Commonly known as: DEMADEX   Take 20 mg by mouth 2 (two) times daily.      Vitamin D (Ergocalciferol) 50000 UNITS Caps   Commonly known as: DRISDOL   Take 50,000 Units by mouth 2 (two) times a week. Take on Mon and Thurs.          Disposition   The patient will be discharged in stable condition to home. Discharge Orders    Future Appointments: Provider: Department: Dept Phone: Center:   05/05/2012 9:30 AM [facility] [phone] None   05/26/2012 8:00 AM [facility] [phone] [facility]   08/09/2012 10:15 AM Barbaraann Share, MD [facility] [phone] None     Future Orders Please Complete By Expires   Diet - low sodium heart healthy      Increase activity slowly      (HEART FAILURE PATIENTS) Call MD:  Anytime you have any of the following symptoms: 1) 3 pound weight gain in 24 hours or 5 pounds in 1 week 2) shortness of breath, with or without a dry hacking cough 3) swelling in the hands, feet or stomach 4) if you have to sleep on extra pillows at night in order to breathe.      Heart Failure patients record your daily  weight using the same scale at the same time of day      Contraindication to ACEI at discharge      Comments:   Diastolic heart failure     Follow-up Information  Follow up with Arvilla Meres, MD. On 05/05/2012. (Garage Code 0800 at 09:30)    Contact information:   [address] [place] [address] [phone]            Duration of Discharge Encounter: Greater than 35 minutes   Signed,  [name], MD .

## 2012-04-29 DIAGNOSIS — I10 Essential (primary) hypertension: Secondary | ICD-10-CM | POA: Diagnosis not present

## 2012-04-29 DIAGNOSIS — G4733 Obstructive sleep apnea (adult) (pediatric): Secondary | ICD-10-CM | POA: Diagnosis not present

## 2012-04-29 DIAGNOSIS — I5033 Acute on chronic diastolic (congestive) heart failure: Secondary | ICD-10-CM | POA: Diagnosis not present

## 2012-04-29 DIAGNOSIS — I2510 Atherosclerotic heart disease of native coronary artery without angina pectoris: Secondary | ICD-10-CM | POA: Diagnosis not present

## 2012-04-29 DIAGNOSIS — E119 Type 2 diabetes mellitus without complications: Secondary | ICD-10-CM | POA: Diagnosis not present

## 2012-04-29 DIAGNOSIS — I509 Heart failure, unspecified: Secondary | ICD-10-CM | POA: Diagnosis not present

## 2012-05-03 ENCOUNTER — Encounter (HOSPITAL_COMMUNITY): Payer: Medicare Other

## 2012-05-03 DIAGNOSIS — I5033 Acute on chronic diastolic (congestive) heart failure: Secondary | ICD-10-CM | POA: Diagnosis not present

## 2012-05-03 DIAGNOSIS — G4733 Obstructive sleep apnea (adult) (pediatric): Secondary | ICD-10-CM | POA: Diagnosis not present

## 2012-05-03 DIAGNOSIS — I2510 Atherosclerotic heart disease of native coronary artery without angina pectoris: Secondary | ICD-10-CM | POA: Diagnosis not present

## 2012-05-03 DIAGNOSIS — E119 Type 2 diabetes mellitus without complications: Secondary | ICD-10-CM | POA: Diagnosis not present

## 2012-05-03 DIAGNOSIS — I10 Essential (primary) hypertension: Secondary | ICD-10-CM | POA: Diagnosis not present

## 2012-05-03 DIAGNOSIS — I509 Heart failure, unspecified: Secondary | ICD-10-CM | POA: Diagnosis not present

## 2012-05-05 ENCOUNTER — Ambulatory Visit (HOSPITAL_COMMUNITY)
Admission: RE | Admit: 2012-05-05 | Discharge: 2012-05-05 | Disposition: A | Discharge status: Home / Self Care | Payer: Medicare Other | Source: Ambulatory Visit | Attending: Internal Medicine | Admitting: Internal Medicine

## 2012-05-05 ENCOUNTER — Encounter (HOSPITAL_COMMUNITY): Payer: Self-pay

## 2012-05-05 VITALS — BP 148/76 | HR 88 | Ht 61.0 in | Wt 272.1 lb

## 2012-05-05 DIAGNOSIS — I10 Essential (primary) hypertension: Secondary | ICD-10-CM | POA: Insufficient documentation

## 2012-05-05 DIAGNOSIS — L97509 Non-pressure chronic ulcer of other part of unspecified foot with unspecified severity: Secondary | ICD-10-CM | POA: Diagnosis not present

## 2012-05-05 DIAGNOSIS — M9080 Osteopathy in diseases classified elsewhere, unspecified site: Secondary | ICD-10-CM | POA: Diagnosis not present

## 2012-05-05 DIAGNOSIS — I5032 Chronic diastolic (congestive) heart failure: Secondary | ICD-10-CM | POA: Diagnosis not present

## 2012-05-05 DIAGNOSIS — E678 Other specified hyperalimentation: Secondary | ICD-10-CM

## 2012-05-05 DIAGNOSIS — M869 Osteomyelitis, unspecified: Secondary | ICD-10-CM | POA: Diagnosis not present

## 2012-05-05 DIAGNOSIS — E669 Obesity, unspecified: Secondary | ICD-10-CM | POA: Diagnosis not present

## 2012-05-05 DIAGNOSIS — E1169 Type 2 diabetes mellitus with other specified complication: Secondary | ICD-10-CM | POA: Diagnosis not present

## 2012-05-05 NOTE — Assessment & Plan Note (Signed)
Continue bipap

## 2012-05-05 NOTE — Assessment & Plan Note (Addendum)
Volume status mildly elevated.  Double demadex 40 mg daily for next 3 days.  Keep weight around 171-172 pounds.  Reinforced fluid restrictions.  Discussed sliding scale lasix.  Continue TED hose.  Follow up 1 month with labs.

## 2012-05-05 NOTE — Assessment & Plan Note (Signed)
Uncontrolled in clinic.  Patient states SBP at home 117-120s.  Will have her keep daily log and bring to follow up appointment prior to med titration.  She voices understanding.

## 2012-05-05 NOTE — Patient Instructions (Addendum)
Increase demadex 20 mg twice daily until weight 171 pounds.  Follow up 1 month

## 2012-05-05 NOTE — Progress Notes (Signed)
HPI:  Primary Cardiologist:  Dr. [name] is a 66 y.o. female with a history of morbid obesity complicated by obesity hypoventilation syndrome, hypertension, sleep apnea, diabetes, CRI and mild secondary pulmonary hypertension as well as hypokalemia. Carotid stenosis 40-59% bilaterally.  Initially, she had an echocardiogram read as severe LV dysfunction with an EF of 15-20%. She underwent catheterization in 2008 that showed minimal nonobstructive coronary artery disease with EF 55%. She had mild pulmonary hypertension with both pulmonary venous and pulmonary arterial components. Her PVR was 5.1 Wood units. Echo in 3/09 EF 60%.  Saw Dr. Antoine [name] in 8/11 over question of possible AF. ECG with PACs. 24 monitor NSR with occ PACs and PVCS. No AF.    Pt was hospitalized at the end of August 2012 for acute on chronic diastolic heart failure.  Diuresed ~17 lbs with discharge weight of 282 lbs.  Switched from ACE-I to ARB due to cough.  She was admitted 10/3-10/10 for acute on chronic diastolic HF and 25 pound weight gain.  She diuresed with IV lasix and discharge weight was 269 pounds.   She returns today for post hospital follow up.  She feels really good.  She has been compliant with her meds.  Her weight at home was staying 171 until today and it is 174 pounds.  She denies dyspnea, orthopnea or PND.  Edema improved, wearing compression hose.  Uses Bipap at night.  She is trying to follow low sodium diet and watch fluid.      ROS: All systems negative except as listed in HPI, PMH and Problem List.  Past Medical History  Diagnosis Date  . OSA (obstructive sleep apnea)   . Thrombophlebitis of deep vein of leg   . DM (diabetes mellitus)   . CHF (congestive heart failure)     hx of EF 15-20% in 4/08,  echo 3/09 EF 60-65%;   echo 6/12: EF 55-60%, mild LAE  . Carotid stenosis     u/s 6/10: R 0-39%Ll 40-59%;  u/s 7/12: 40-59% bilateral (repeat in 7/13)  . HTN (hypertension)   .  Morbid obesity   . Pulmonary HTN     mild,  cath 6/08 with PVR 5.1  . Hypokalemia   . 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 Spinal stenosis   . Obesity hypoventilation syndrome   . Cancer     hx of colon CA 2000  . Arthritis      Current Outpatient Prescriptions on File Prior to Encounter  Medication Sig Dispense Refill  . aspirin (BAYER ASPIRIN) 325 MG tablet Take 325 mg by mouth daily.        . carvedilol (COREG) 3.125 MG tablet Take 3.125 mg by mouth 2 (two) times daily with a meal.       . cyanocobalamin (,VITAMIN B-12,) 1000 MCG/ML injection Inject 1,000 mcg into the muscle every 30 (thirty) days. Pt states last dose at end of August      . doxycycline (VIBRAMYCIN) 100 MG capsule Take 100 mg by mouth 2 (two) times daily. For infection in toe. Patient states she has taken for 4 weeks and has 2 more weeks to complete.      . insulin glargine (LANTUS) 100 UNIT/ML injection Inject 42-80 Units into the skin 2 (two) times daily. Pt uses 42 units in AM and 80 units in PM      . insulin glulisine (APIDRA) 100 UNIT/ML injection  Inject 10 Units into the skin 3 (three) times daily before meals.       Marland Kitchen LORazepam (ATIVAN) 1 MG tablet Take 1 mg by mouth 2 (two) times daily.        . sertraline (ZOLOFT) 100 MG tablet Take 50 mg by mouth daily. Take 1/2 tab by mouth once daily      . simvastatin (ZOCOR) 40 MG tablet Take 1 tablet (40 mg total) by mouth at bedtime.  90 tablet  3  . spironolactone (ALDACTONE) 25 MG tablet Take 1 tablet (25 mg total) by mouth 2 (two) times daily.  60 tablet  6  . torsemide (DEMADEX) 20 MG tablet Take 20 mg by mouth daily.       . Vitamin D, Ergocalciferol, (DRISDOL) 50000 UNITS CAPS Take 50,000 Units by mouth 2 (two) times a week. Take on Mon and Thurs.       Allergies  Allergen Reactions  . Codeine     sleepy  . Meperidine Hcl     Increase in heart rate  . Metformin     Makes me sick     PHYSICAL EXAM: Filed Vitals:   05/05/12  0927  BP: 148/76  Pulse: 88  Height: 5\' 1"  (1.549 m)  Weight: 272 lb 1.9 oz (123.433 kg)  SpO2: 94%    General: Well appearing. No resp difficulty HEENT: normal Neck: supple. JVP 9-10.  Carotids 2+ bilaterally; no bruits. No lymphadenopathy or thryomegaly appreciated. Cor: PMI normal. Regular rate & rhythm. No rubs, gallops or murmurs. Lungs: clear Abdomen: obese,  nontender, mildly distended. No hepatosplenomegaly. No bruits or masses. Good bowel sounds. Extremities: trace edema. No cyanosis or clubbing.  TED hose in place bilaterally Neuro: alert & orientedx3, cranial nerves grossly intact. Moves all 4 extremities w/o difficulty. Affect pleasant.     ASSESSMENT & PLAN:

## 2012-05-11 DIAGNOSIS — I10 Essential (primary) hypertension: Secondary | ICD-10-CM | POA: Diagnosis not present

## 2012-05-11 DIAGNOSIS — G4733 Obstructive sleep apnea (adult) (pediatric): Secondary | ICD-10-CM | POA: Diagnosis not present

## 2012-05-11 DIAGNOSIS — E119 Type 2 diabetes mellitus without complications: Secondary | ICD-10-CM | POA: Diagnosis not present

## 2012-05-11 DIAGNOSIS — I509 Heart failure, unspecified: Secondary | ICD-10-CM | POA: Diagnosis not present

## 2012-05-11 DIAGNOSIS — I5033 Acute on chronic diastolic (congestive) heart failure: Secondary | ICD-10-CM | POA: Diagnosis not present

## 2012-05-11 DIAGNOSIS — I2510 Atherosclerotic heart disease of native coronary artery without angina pectoris: Secondary | ICD-10-CM | POA: Diagnosis not present

## 2012-05-12 DIAGNOSIS — E119 Type 2 diabetes mellitus without complications: Secondary | ICD-10-CM | POA: Diagnosis not present

## 2012-05-12 DIAGNOSIS — M869 Osteomyelitis, unspecified: Secondary | ICD-10-CM | POA: Diagnosis not present

## 2012-05-12 DIAGNOSIS — L97509 Non-pressure chronic ulcer of other part of unspecified foot with unspecified severity: Secondary | ICD-10-CM | POA: Diagnosis not present

## 2012-05-12 DIAGNOSIS — M9080 Osteopathy in diseases classified elsewhere, unspecified site: Secondary | ICD-10-CM | POA: Diagnosis not present

## 2012-05-12 DIAGNOSIS — I2510 Atherosclerotic heart disease of native coronary artery without angina pectoris: Secondary | ICD-10-CM | POA: Diagnosis not present

## 2012-05-12 DIAGNOSIS — T07XXXA Unspecified multiple injuries, initial encounter: Secondary | ICD-10-CM | POA: Diagnosis not present

## 2012-05-12 DIAGNOSIS — I5033 Acute on chronic diastolic (congestive) heart failure: Secondary | ICD-10-CM | POA: Diagnosis not present

## 2012-05-12 DIAGNOSIS — E1169 Type 2 diabetes mellitus with other specified complication: Secondary | ICD-10-CM | POA: Diagnosis not present

## 2012-05-12 DIAGNOSIS — I509 Heart failure, unspecified: Secondary | ICD-10-CM | POA: Diagnosis not present

## 2012-05-12 DIAGNOSIS — I10 Essential (primary) hypertension: Secondary | ICD-10-CM | POA: Diagnosis not present

## 2012-05-12 DIAGNOSIS — G4733 Obstructive sleep apnea (adult) (pediatric): Secondary | ICD-10-CM | POA: Diagnosis not present

## 2012-05-19 DIAGNOSIS — E1169 Type 2 diabetes mellitus with other specified complication: Secondary | ICD-10-CM | POA: Diagnosis not present

## 2012-05-19 DIAGNOSIS — M9080 Osteopathy in diseases classified elsewhere, unspecified site: Secondary | ICD-10-CM | POA: Diagnosis not present

## 2012-05-19 DIAGNOSIS — L97509 Non-pressure chronic ulcer of other part of unspecified foot with unspecified severity: Secondary | ICD-10-CM | POA: Diagnosis not present

## 2012-05-19 DIAGNOSIS — M869 Osteomyelitis, unspecified: Secondary | ICD-10-CM | POA: Diagnosis not present

## 2012-05-24 DIAGNOSIS — I509 Heart failure, unspecified: Secondary | ICD-10-CM | POA: Diagnosis not present

## 2012-05-24 DIAGNOSIS — I2510 Atherosclerotic heart disease of native coronary artery without angina pectoris: Secondary | ICD-10-CM | POA: Diagnosis not present

## 2012-05-24 DIAGNOSIS — I10 Essential (primary) hypertension: Secondary | ICD-10-CM | POA: Diagnosis not present

## 2012-05-24 DIAGNOSIS — G4733 Obstructive sleep apnea (adult) (pediatric): Secondary | ICD-10-CM | POA: Diagnosis not present

## 2012-05-24 DIAGNOSIS — I5033 Acute on chronic diastolic (congestive) heart failure: Secondary | ICD-10-CM | POA: Diagnosis not present

## 2012-05-24 DIAGNOSIS — E119 Type 2 diabetes mellitus without complications: Secondary | ICD-10-CM | POA: Diagnosis not present

## 2012-05-25 DIAGNOSIS — G4733 Obstructive sleep apnea (adult) (pediatric): Secondary | ICD-10-CM | POA: Diagnosis not present

## 2012-05-25 DIAGNOSIS — I2510 Atherosclerotic heart disease of native coronary artery without angina pectoris: Secondary | ICD-10-CM | POA: Diagnosis not present

## 2012-05-25 DIAGNOSIS — I5033 Acute on chronic diastolic (congestive) heart failure: Secondary | ICD-10-CM | POA: Diagnosis not present

## 2012-05-25 DIAGNOSIS — I10 Essential (primary) hypertension: Secondary | ICD-10-CM | POA: Diagnosis not present

## 2012-05-25 DIAGNOSIS — E119 Type 2 diabetes mellitus without complications: Secondary | ICD-10-CM | POA: Diagnosis not present

## 2012-05-25 DIAGNOSIS — I509 Heart failure, unspecified: Secondary | ICD-10-CM | POA: Diagnosis not present

## 2012-05-26 ENCOUNTER — Encounter (HOSPITAL_BASED_OUTPATIENT_CLINIC_OR_DEPARTMENT_OTHER): Payer: Medicare Other | Attending: Internal Medicine

## 2012-05-26 DIAGNOSIS — M86679 Other chronic osteomyelitis, unspecified ankle and foot: Secondary | ICD-10-CM | POA: Diagnosis not present

## 2012-05-26 DIAGNOSIS — M9080 Osteopathy in diseases classified elsewhere, unspecified site: Secondary | ICD-10-CM | POA: Insufficient documentation

## 2012-05-26 DIAGNOSIS — L97509 Non-pressure chronic ulcer of other part of unspecified foot with unspecified severity: Secondary | ICD-10-CM | POA: Diagnosis not present

## 2012-05-26 DIAGNOSIS — E1169 Type 2 diabetes mellitus with other specified complication: Secondary | ICD-10-CM | POA: Insufficient documentation

## 2012-06-01 DIAGNOSIS — I5033 Acute on chronic diastolic (congestive) heart failure: Secondary | ICD-10-CM | POA: Diagnosis not present

## 2012-06-02 DIAGNOSIS — E1169 Type 2 diabetes mellitus with other specified complication: Secondary | ICD-10-CM | POA: Diagnosis not present

## 2012-06-02 DIAGNOSIS — L97509 Non-pressure chronic ulcer of other part of unspecified foot with unspecified severity: Secondary | ICD-10-CM | POA: Diagnosis not present

## 2012-06-02 DIAGNOSIS — M9080 Osteopathy in diseases classified elsewhere, unspecified site: Secondary | ICD-10-CM | POA: Diagnosis not present

## 2012-06-02 DIAGNOSIS — M86679 Other chronic osteomyelitis, unspecified ankle and foot: Secondary | ICD-10-CM | POA: Diagnosis not present

## 2012-06-06 ENCOUNTER — Ambulatory Visit (HOSPITAL_COMMUNITY): Payer: Medicare Other

## 2012-06-08 DIAGNOSIS — I5033 Acute on chronic diastolic (congestive) heart failure: Secondary | ICD-10-CM | POA: Diagnosis not present

## 2012-06-08 DIAGNOSIS — I10 Essential (primary) hypertension: Secondary | ICD-10-CM | POA: Diagnosis not present

## 2012-06-08 DIAGNOSIS — E119 Type 2 diabetes mellitus without complications: Secondary | ICD-10-CM | POA: Diagnosis not present

## 2012-06-08 DIAGNOSIS — G4733 Obstructive sleep apnea (adult) (pediatric): Secondary | ICD-10-CM | POA: Diagnosis not present

## 2012-06-08 DIAGNOSIS — I509 Heart failure, unspecified: Secondary | ICD-10-CM | POA: Diagnosis not present

## 2012-06-08 DIAGNOSIS — I2510 Atherosclerotic heart disease of native coronary artery without angina pectoris: Secondary | ICD-10-CM | POA: Diagnosis not present

## 2012-06-09 DIAGNOSIS — M86679 Other chronic osteomyelitis, unspecified ankle and foot: Secondary | ICD-10-CM | POA: Diagnosis not present

## 2012-06-09 DIAGNOSIS — E1169 Type 2 diabetes mellitus with other specified complication: Secondary | ICD-10-CM | POA: Diagnosis not present

## 2012-06-09 DIAGNOSIS — L97509 Non-pressure chronic ulcer of other part of unspecified foot with unspecified severity: Secondary | ICD-10-CM | POA: Diagnosis not present

## 2012-06-09 DIAGNOSIS — M9080 Osteopathy in diseases classified elsewhere, unspecified site: Secondary | ICD-10-CM | POA: Diagnosis not present

## 2012-06-14 ENCOUNTER — Inpatient Hospital Stay (HOSPITAL_COMMUNITY): Admission: RE | Admit: 2012-06-14 | Payer: Medicare Other | Source: Ambulatory Visit

## 2012-06-22 ENCOUNTER — Ambulatory Visit (HOSPITAL_COMMUNITY)
Admission: RE | Admit: 2012-06-22 | Discharge: 2012-06-22 | Disposition: A | Discharge status: Home / Self Care | Payer: Medicare Other | Source: Ambulatory Visit | Attending: Internal Medicine | Admitting: Internal Medicine

## 2012-06-22 ENCOUNTER — Encounter (HOSPITAL_COMMUNITY): Payer: Self-pay

## 2012-06-22 VITALS — BP 108/58 | HR 70 | Wt 275.4 lb

## 2012-06-22 DIAGNOSIS — I5032 Chronic diastolic (congestive) heart failure: Secondary | ICD-10-CM | POA: Diagnosis not present

## 2012-06-22 DIAGNOSIS — I10 Essential (primary) hypertension: Secondary | ICD-10-CM

## 2012-06-22 NOTE — Patient Instructions (Addendum)
Follow up 2 months with Dr. Bensimhon.  Do the following things EVERYDAY: 1) Weigh yourself in the morning before breakfast. Write it down and keep it in a log. 2) Take your medicines as prescribed 3) Eat low salt foods-Limit salt (sodium) to 2000 mg per day.  4) Stay as active as you can everyday 5) Limit all fluids for the day to less than 2 liters 

## 2012-06-22 NOTE — Progress Notes (Signed)
Primary Cardiologist:  Dr. Arvilla Meres PCP: Dr. Clarene Duke at [facility]  HPI:  [name] is a 66 y.o. female with a history of morbid obesity complicated by obesity hypoventilation syndrome, hypertension, sleep apnea, diabetes, CRI and mild secondary pulmonary hypertension as well as hypokalemia. Carotid stenosis 40-59% bilaterally.  Initially, she had an echocardiogram read as severe LV dysfunction with an EF of 15-20%. She underwent catheterization in 2008 that showed minimal nonobstructive coronary artery disease with EF 55%. She had mild pulmonary hypertension with both pulmonary venous and pulmonary arterial components. Her PVR was 5.1 Wood units. Echo in 3/09 EF 60%.  Saw Dr. Antoine Poche in 8/11 over question of possible AF. ECG with PACs. 24 monitor NSR with occ PACs and PVCS. No AF.    Pt was hospitalized at the end of August 2012 for acute on chronic diastolic heart failure.  Diuresed ~17 lbs with discharge weight of 282 lbs.  Switched from ACE-I to ARB due to cough.  She was admitted 10/3-10/10 for acute on chronic diastolic HF and 25 pound weight gain.  She diuresed with IV lasix and discharge weight was 269 pounds.   She returns today for follow up today.  She feels well.  She has been keeping her weight 268-272 pounds.  She denies dyspnea, orthopnea or PND.  Her main issue is the nonhealing wound on her left great toe.  She scratched it on a nail and is being followed by the wound center.  She will restart rehab once she is cleared for her toe.  She is compliant with meds.  Continues to use Bipap at night.  Denies dyspnea, orthopnea or PND.      ROS: All systems negative except as listed in HPI, PMH and Problem List.  Past Medical History  Diagnosis Date  . OSA (obstructive sleep apnea)   . Thrombophlebitis of deep vein of leg   . DM (diabetes mellitus)   . CHF (congestive heart failure)     hx of EF 15-20% in 4/08,  echo 3/09 EF 60-65%;   echo 6/12: EF 55-60%,  mild LAE  . Carotid stenosis     u/s 6/10: R 0-39%Ll 40-59%;  u/s 7/12: 40-59% bilateral (repeat in 7/13)  . HTN (hypertension)   . Morbid obesity   . Pulmonary HTN     mild,  cath 6/08 with PVR 5.1  . Hypokalemia   . 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 Spinal stenosis   . Obesity hypoventilation syndrome   . Cancer     hx of colon CA 2000  . Arthritis      Current Outpatient Prescriptions on File Prior to Encounter  Medication Sig Dispense Refill  . aspirin (BAYER ASPIRIN) 325 MG tablet Take 325 mg by mouth daily.        . carvedilol (COREG) 3.125 MG tablet Take 3.125 mg by mouth 2 (two) times daily with a meal.       . cyanocobalamin (,VITAMIN B-12,) 1000 MCG/ML injection Inject 1,000 mcg into the muscle every 30 (thirty) days. Pt states last dose at end of August      . insulin glargine (LANTUS) 100 UNIT/ML injection Inject 42-80 Units into the skin 2 (two) times daily. Pt uses 42 units in AM and 80 units in PM      . insulin glulisine (APIDRA) 100 UNIT/ML injection Inject 10 Units into the skin 3 (three) times daily before meals.       Marland Kitchen  LORazepam (ATIVAN) 1 MG tablet Take 1 mg by mouth 2 (two) times daily.        . sertraline (ZOLOFT) 100 MG tablet Take 50 mg by mouth daily. Take 1/2 tab by mouth once daily      . simvastatin (ZOCOR) 40 MG tablet Take 1 tablet (40 mg total) by mouth at bedtime.  90 tablet  3  . spironolactone (ALDACTONE) 25 MG tablet Take 1 tablet (25 mg total) by mouth 2 (two) times daily.  60 tablet  6  . torsemide (DEMADEX) 20 MG tablet Take 20 mg by mouth daily.       . Vitamin D, Ergocalciferol, (DRISDOL) 50000 UNITS CAPS Take 50,000 Units by mouth 2 (two) times a week. Take on Mon and Thurs.      . doxycycline (VIBRAMYCIN) 100 MG capsule Take 100 mg by mouth 2 (two) times daily. For infection in toe. Patient states she has taken for 4 weeks and has 2 more weeks to complete.       Allergies  Allergen Reactions  . Codeine      sleepy  . Meperidine Hcl     Increase in heart rate  . Metformin     Makes me sick     PHYSICAL EXAM: Filed Vitals:   06/22/12 1416  BP: 108/58  Pulse: 70  Weight: 275 lb 6.4 oz (124.921 kg)  SpO2: 97%    General: Well appearing. No resp difficulty HEENT: normal Neck: supple. JVP 6-7.  Carotids 2+ bilaterally; no bruits. No lymphadenopathy or thryomegaly appreciated. Cor: PMI normal. Regular rate & rhythm. No rubs, gallops or murmurs. Lungs: clear Abdomen: obese,  nontender, mildly distended. No hepatosplenomegaly. No bruits or masses. Good bowel sounds. Extremities: trace edema. No cyanosis or clubbing.  TED hose in place bilaterally.  Left bandage big toe  Neuro: alert & orientedx3, cranial nerves grossly intact. Moves all 4 extremities w/o difficulty. Affect pleasant.     ASSESSMENT & PLAN:

## 2012-06-23 ENCOUNTER — Encounter (HOSPITAL_BASED_OUTPATIENT_CLINIC_OR_DEPARTMENT_OTHER): Payer: Medicare Other | Attending: Internal Medicine

## 2012-06-23 DIAGNOSIS — L97509 Non-pressure chronic ulcer of other part of unspecified foot with unspecified severity: Secondary | ICD-10-CM | POA: Insufficient documentation

## 2012-06-23 DIAGNOSIS — M869 Osteomyelitis, unspecified: Secondary | ICD-10-CM | POA: Insufficient documentation

## 2012-06-23 DIAGNOSIS — E1169 Type 2 diabetes mellitus with other specified complication: Secondary | ICD-10-CM | POA: Diagnosis not present

## 2012-06-24 DIAGNOSIS — I10 Essential (primary) hypertension: Secondary | ICD-10-CM | POA: Diagnosis not present

## 2012-06-24 DIAGNOSIS — I5033 Acute on chronic diastolic (congestive) heart failure: Secondary | ICD-10-CM | POA: Diagnosis not present

## 2012-06-24 DIAGNOSIS — G4733 Obstructive sleep apnea (adult) (pediatric): Secondary | ICD-10-CM | POA: Diagnosis not present

## 2012-06-24 DIAGNOSIS — I509 Heart failure, unspecified: Secondary | ICD-10-CM | POA: Diagnosis not present

## 2012-06-24 DIAGNOSIS — I2510 Atherosclerotic heart disease of native coronary artery without angina pectoris: Secondary | ICD-10-CM | POA: Diagnosis not present

## 2012-06-24 DIAGNOSIS — E119 Type 2 diabetes mellitus without complications: Secondary | ICD-10-CM | POA: Diagnosis not present

## 2012-06-24 NOTE — Assessment & Plan Note (Signed)
Controlled. Continue current regimen. 

## 2012-06-24 NOTE — Assessment & Plan Note (Signed)
She is doing well from a HF perspective.  Will continue current meds with sliding scale instructions.  Re-educated on fluid restrictions and low sodium diet.  She voices understanding.

## 2012-06-30 DIAGNOSIS — E1169 Type 2 diabetes mellitus with other specified complication: Secondary | ICD-10-CM | POA: Diagnosis not present

## 2012-06-30 DIAGNOSIS — M869 Osteomyelitis, unspecified: Secondary | ICD-10-CM | POA: Diagnosis not present

## 2012-06-30 DIAGNOSIS — L97509 Non-pressure chronic ulcer of other part of unspecified foot with unspecified severity: Secondary | ICD-10-CM | POA: Diagnosis not present

## 2012-07-07 DIAGNOSIS — Z23 Encounter for immunization: Secondary | ICD-10-CM | POA: Diagnosis not present

## 2012-07-07 DIAGNOSIS — I10 Essential (primary) hypertension: Secondary | ICD-10-CM | POA: Diagnosis not present

## 2012-07-07 DIAGNOSIS — M869 Osteomyelitis, unspecified: Secondary | ICD-10-CM | POA: Diagnosis not present

## 2012-07-07 DIAGNOSIS — D518 Other vitamin B12 deficiency anemias: Secondary | ICD-10-CM | POA: Diagnosis not present

## 2012-07-07 DIAGNOSIS — L97509 Non-pressure chronic ulcer of other part of unspecified foot with unspecified severity: Secondary | ICD-10-CM | POA: Diagnosis not present

## 2012-07-07 DIAGNOSIS — E1169 Type 2 diabetes mellitus with other specified complication: Secondary | ICD-10-CM | POA: Diagnosis not present

## 2012-07-07 DIAGNOSIS — E109 Type 1 diabetes mellitus without complications: Secondary | ICD-10-CM | POA: Diagnosis not present

## 2012-07-14 DIAGNOSIS — L97509 Non-pressure chronic ulcer of other part of unspecified foot with unspecified severity: Secondary | ICD-10-CM | POA: Diagnosis not present

## 2012-07-14 DIAGNOSIS — M869 Osteomyelitis, unspecified: Secondary | ICD-10-CM | POA: Diagnosis not present

## 2012-07-14 DIAGNOSIS — E1169 Type 2 diabetes mellitus with other specified complication: Secondary | ICD-10-CM | POA: Diagnosis not present

## 2012-07-21 ENCOUNTER — Encounter (HOSPITAL_BASED_OUTPATIENT_CLINIC_OR_DEPARTMENT_OTHER): Payer: Medicare Other | Attending: Internal Medicine

## 2012-07-21 DIAGNOSIS — M869 Osteomyelitis, unspecified: Secondary | ICD-10-CM | POA: Insufficient documentation

## 2012-07-21 DIAGNOSIS — E1169 Type 2 diabetes mellitus with other specified complication: Secondary | ICD-10-CM | POA: Insufficient documentation

## 2012-07-21 DIAGNOSIS — L97509 Non-pressure chronic ulcer of other part of unspecified foot with unspecified severity: Secondary | ICD-10-CM | POA: Insufficient documentation

## 2012-07-28 DIAGNOSIS — M869 Osteomyelitis, unspecified: Secondary | ICD-10-CM | POA: Diagnosis not present

## 2012-07-28 DIAGNOSIS — L97509 Non-pressure chronic ulcer of other part of unspecified foot with unspecified severity: Secondary | ICD-10-CM | POA: Diagnosis not present

## 2012-07-28 DIAGNOSIS — E1169 Type 2 diabetes mellitus with other specified complication: Secondary | ICD-10-CM | POA: Diagnosis not present

## 2012-08-04 DIAGNOSIS — E1169 Type 2 diabetes mellitus with other specified complication: Secondary | ICD-10-CM | POA: Diagnosis not present

## 2012-08-04 DIAGNOSIS — M869 Osteomyelitis, unspecified: Secondary | ICD-10-CM | POA: Diagnosis not present

## 2012-08-04 DIAGNOSIS — L97509 Non-pressure chronic ulcer of other part of unspecified foot with unspecified severity: Secondary | ICD-10-CM | POA: Diagnosis not present

## 2012-08-09 ENCOUNTER — Encounter: Payer: Self-pay | Admitting: Pulmonary Disease

## 2012-08-09 ENCOUNTER — Ambulatory Visit (INDEPENDENT_AMBULATORY_CARE_PROVIDER_SITE_OTHER): Payer: Medicare Other | Admitting: Pulmonary Disease

## 2012-08-09 VITALS — BP 128/58 | HR 81 | Temp 98.3°F | Ht 63.0 in | Wt 283.4 lb

## 2012-08-09 DIAGNOSIS — E678 Other specified hyperalimentation: Secondary | ICD-10-CM

## 2012-08-09 NOTE — Progress Notes (Signed)
  Subjective:    Patient ID: [name], female    DOB: 01-04-1946, 67 y.o.   MRN: [id number]  HPI The patient comes in today for followup of her obesity hypoventilation syndrome.  She is wearing bilevel compliantly, and is having no issues with her equipment.  Her weight has increased considerably since the last visit, and unfortunately she has not been totally compliant with her daily weights.  She feels that her breathing is at her usual baseline.   Review of Systems  Constitutional: Negative for fever and unexpected weight change.  HENT: Positive for nosebleeds ( x 3 weeks), congestion, rhinorrhea and postnasal drip. Negative for ear pain, sore throat, sneezing, trouble swallowing, dental problem and sinus pressure.   Eyes: Negative for redness and itching.  Respiratory: Positive for cough. Negative for chest tightness, shortness of breath and wheezing.   Cardiovascular: Negative for palpitations and leg swelling.  Gastrointestinal: Negative for nausea and vomiting.  Genitourinary: Negative for dysuria.  Musculoskeletal: Negative for joint swelling.  Skin: Negative for rash.  Neurological: Positive for headaches ( behind eyes).  Hematological: Does not bruise/bleed easily.  Psychiatric/Behavioral: Negative for dysphoric mood. The patient is not nervous/anxious.        Objective:   Physical Exam Obese female in no acute distress Nose without purulence or discharge noted No skin breakdown or pressure necrosis from the CPAP mask Chest with a few basilar crackles, otherwise clear Cardiac exam with regular rate and rhythm Lower extremities with edema noted, no cyanosis Alert and oriented, moves all 4 extremities.       Assessment & Plan:

## 2012-08-09 NOTE — Assessment & Plan Note (Signed)
The patient is wearing bilevel compliantly, is having no issues with her mask or pressure.  She feels that she sleeps well, and that her alertness is adequate during the day.  She unfortunately continues to gain weight, most of the time related to fluid.  She has a protocol in place for additional diuretic therapy whenever her weight increases.  She tells me that she is not weight compliantly over the last week or 2.  I have asked her to call the nurse at the congestive heart failure clinic to touch base.

## 2012-08-09 NOTE — Patient Instructions (Addendum)
Stay on your bipap, keep up with mask changes and supplies. Start using your heated humidifier, and adjust temperature down if you are getting too much moisture. Work on weight loss Weight every day, and call heart failure clinic if going up. followup with me in 6mos.

## 2012-08-11 DIAGNOSIS — N3000 Acute cystitis without hematuria: Secondary | ICD-10-CM | POA: Diagnosis not present

## 2012-08-11 DIAGNOSIS — M869 Osteomyelitis, unspecified: Secondary | ICD-10-CM | POA: Diagnosis not present

## 2012-08-11 DIAGNOSIS — N3020 Other chronic cystitis without hematuria: Secondary | ICD-10-CM | POA: Diagnosis not present

## 2012-08-11 DIAGNOSIS — L97509 Non-pressure chronic ulcer of other part of unspecified foot with unspecified severity: Secondary | ICD-10-CM | POA: Diagnosis not present

## 2012-08-11 DIAGNOSIS — E1169 Type 2 diabetes mellitus with other specified complication: Secondary | ICD-10-CM | POA: Diagnosis not present

## 2012-08-18 DIAGNOSIS — M869 Osteomyelitis, unspecified: Secondary | ICD-10-CM | POA: Diagnosis not present

## 2012-08-18 DIAGNOSIS — E1169 Type 2 diabetes mellitus with other specified complication: Secondary | ICD-10-CM | POA: Diagnosis not present

## 2012-08-18 DIAGNOSIS — L97509 Non-pressure chronic ulcer of other part of unspecified foot with unspecified severity: Secondary | ICD-10-CM | POA: Diagnosis not present

## 2012-08-25 ENCOUNTER — Ambulatory Visit (HOSPITAL_COMMUNITY)
Admission: RE | Admit: 2012-08-25 | Discharge: 2012-08-25 | Disposition: A | Discharge status: Home / Self Care | Payer: Medicare Other | Source: Ambulatory Visit | Attending: Internal Medicine | Admitting: Internal Medicine

## 2012-08-25 ENCOUNTER — Encounter (HOSPITAL_BASED_OUTPATIENT_CLINIC_OR_DEPARTMENT_OTHER): Payer: Medicare Other | Attending: Internal Medicine

## 2012-08-25 ENCOUNTER — Encounter (HOSPITAL_COMMUNITY): Payer: Self-pay

## 2012-08-25 VITALS — BP 148/64 | HR 70 | Wt 285.0 lb

## 2012-08-25 DIAGNOSIS — G4733 Obstructive sleep apnea (adult) (pediatric): Secondary | ICD-10-CM | POA: Diagnosis not present

## 2012-08-25 DIAGNOSIS — L97509 Non-pressure chronic ulcer of other part of unspecified foot with unspecified severity: Secondary | ICD-10-CM | POA: Insufficient documentation

## 2012-08-25 DIAGNOSIS — E1169 Type 2 diabetes mellitus with other specified complication: Secondary | ICD-10-CM | POA: Insufficient documentation

## 2012-08-25 DIAGNOSIS — I5032 Chronic diastolic (congestive) heart failure: Secondary | ICD-10-CM | POA: Diagnosis not present

## 2012-08-25 MED ORDER — TORSEMIDE 20 MG PO TABS: 40.0000 mg | ORAL_TABLET | Freq: Every day | ORAL | Status: DC

## 2012-08-25 MED ORDER — METOLAZONE 2.5 MG PO TABS: ORAL_TABLET | ORAL | Status: DC

## 2012-08-25 NOTE — Addendum Note (Signed)
Encounter addended by: Noralee Space, RN on: 08/25/2012  4:42 PM<BR>     Documentation filed: Orders

## 2012-08-25 NOTE — Assessment & Plan Note (Signed)
Continue CPAP per Dr. Clance. 

## 2012-08-25 NOTE — Assessment & Plan Note (Signed)
Volume status elevated. Add 2.5 mg Metolazone every Thursday and increase Torsemide to 40 mg daily. Re-order [facility] to assist with HF management and tele-monitoring. Check BMET next week. Reinforced daily weights, low salt food choices, and medication compliance. Follow up in 2 weeks to reassess volume status.

## 2012-08-25 NOTE — Progress Notes (Signed)
Patient ID: [name], female   DOB: [birth year], 67 y.o.   MRN: [id number] Primary Cardiologist:  Dr. Arvilla Meres PCP: Dr. Clarene Duke at [facility]  HPI:  [name] is a 67 y.o. female with a history of morbid obesity complicated by obesity hypoventilation syndrome, hypertension, sleep apnea, diabetes, CRI and mild secondary pulmonary hypertension as well as hypokalemia. Carotid stenosis 40-59% bilaterally.  Initially, she had an echocardiogram read as severe LV dysfunction with an EF of 15-20%. She underwent catheterization in 2008 that showed minimal nonobstructive coronary artery disease with EF 55%. She had mild pulmonary hypertension with both pulmonary venous and pulmonary arterial components. Her PVR was 5.1 Wood units. Echo in 3/09 EF 60%.  Saw Dr. Antoine Poche in 8/11 over question of possible AF. ECG with PACs. 24 monitor NSR with occ PACs and PVCS. No AF.    She was hospitalized at the end of August 2012 for acute on chronic diastolic heart failure.  Diuresed ~17 lbs with discharge weight of 282 lbs.  Switched from ACE-I to ARB due to cough.  She was admitted 04/21/12-04/28/12 for acute on chronic diastolic HF and 25 pound weight gain.  She diuresed with IV lasix and discharge weight was 269 pounds.   She returns today for follow up today. SOB with exertion Denies PND/CP. +Orthopnea sleeps on 2 pillows.  Weight at home 278. Uses BIPAP every night. Followed weekly at outpatient wound center for chronic L toe wound. Complaint with medications. Drinking < 2 liters per day. Tries to follow low salt diet.     ROS: All systems negative except as listed in HPI, PMH and Problem List.  Past Medical History  Diagnosis Date  . OSA (obstructive sleep apnea)   . Thrombophlebitis of deep vein of leg   . DM (diabetes mellitus)   . CHF (congestive heart failure)     hx of EF 15-20% in 4/08,  echo 3/09 EF 60-65%;   echo 6/12: EF 55-60%, mild LAE  . Carotid stenosis     u/s 6/10:  R 0-39%Ll 40-59%;  u/s 7/12: 40-59% bilateral (repeat in 7/13)  . HTN (hypertension)   . Morbid obesity   . Pulmonary HTN     mild,  cath 6/08 with PVR 5.1  . Hypokalemia   . 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 Spinal stenosis   . Obesity hypoventilation syndrome   . Cancer     hx of colon CA 2000  . Arthritis      Current Outpatient Prescriptions on File Prior to Encounter  Medication Sig Dispense Refill  . aspirin (BAYER ASPIRIN) 325 MG tablet Take 325 mg by mouth daily.        . carvedilol (COREG) 3.125 MG tablet Take 3.125 mg by mouth 2 (two) times daily with a meal.       . cyanocobalamin (,VITAMIN B-12,) 1000 MCG/ML injection Inject 1,000 mcg into the muscle every 30 (thirty) days. Pt states last dose at end of August      . insulin glargine (LANTUS) 100 UNIT/ML injection Inject 42-80 Units into the skin 2 (two) times daily. Pt uses 42 units in AM and 80 units in PM      . insulin glulisine (APIDRA) 100 UNIT/ML injection Inject 10 Units into the skin 3 (three) times daily before meals.       Marland Kitchen LORazepam (ATIVAN) 1 MG tablet Take 1 mg by mouth 2 (two)  times daily.        . sertraline (ZOLOFT) 100 MG tablet Take 50 mg by mouth daily. Take 1/2 tab by mouth once daily      . simvastatin (ZOCOR) 40 MG tablet Take 1 tablet (40 mg total) by mouth at bedtime.  90 tablet  3  . spironolactone (ALDACTONE) 25 MG tablet Take 1 tablet (25 mg total) by mouth 2 (two) times daily.  60 tablet  6  . torsemide (DEMADEX) 20 MG tablet Take 20 mg by mouth daily.       . Vitamin D, Ergocalciferol, (DRISDOL) 50000 UNITS CAPS Take 50,000 Units by mouth 2 (two) times a week. Take on Mon and Thurs.       Allergies  Allergen Reactions  . Codeine     sleepy  . Meperidine Hcl     Increase in heart rate  . Metformin     Makes me sick     PHYSICAL EXAM: Filed Vitals:   08/25/12 1342  BP: 148/64  Pulse: 70  Weight: 285 lb (129.275 kg)  SpO2: 92%   275>285  Pounds General: Well appearing. No resp difficulty HEENT: normal Neck: supple. JVP 9-10 difficult to tell due to body habitus.   Carotids 2+ bilaterally; no bruits. No lymphadenopathy or thryomegaly appreciated. Cor: PMI normal. Regular rate & rhythm. No rubs, gallops or murmurs. Lungs: clear Abdomen: obese,  nontender, mildly distended. No hepatosplenomegaly. No bruits or masses. Good bowel sounds. Extremities: R and LLE 1+ edema. No cyanosis or clubbing.  TED hose in place bilaterally.  Left bandage big toe  Neuro: alert & orientedx3, cranial nerves grossly intact. Moves all 4 extremities w/o difficulty. Affect pleasant.     ASSESSMENT & PLAN:

## 2012-08-25 NOTE — Patient Instructions (Addendum)
Follow up in 2 weeks  Take Metolazone 2.5 mg every Thursday  Take Torsemide 40 mg daily   Check BMET next Friday   Do the following things EVERYDAY: 1) Weigh yourself in the morning before breakfast. Write it down and keep it in a log. 2) Take your medicines as prescribed 3) Eat low salt foods-Limit salt (sodium) to 2000 mg per day.  4) Stay as active as you can everyday 5) Limit all fluids for the day to less than 2 liters

## 2012-08-27 DIAGNOSIS — I10 Essential (primary) hypertension: Secondary | ICD-10-CM | POA: Diagnosis not present

## 2012-08-27 DIAGNOSIS — E119 Type 2 diabetes mellitus without complications: Secondary | ICD-10-CM | POA: Diagnosis not present

## 2012-08-27 DIAGNOSIS — M159 Polyosteoarthritis, unspecified: Secondary | ICD-10-CM | POA: Diagnosis not present

## 2012-08-27 DIAGNOSIS — I5032 Chronic diastolic (congestive) heart failure: Secondary | ICD-10-CM | POA: Diagnosis not present

## 2012-08-27 DIAGNOSIS — G4733 Obstructive sleep apnea (adult) (pediatric): Secondary | ICD-10-CM | POA: Diagnosis not present

## 2012-08-27 DIAGNOSIS — I509 Heart failure, unspecified: Secondary | ICD-10-CM | POA: Diagnosis not present

## 2012-08-29 DIAGNOSIS — I509 Heart failure, unspecified: Secondary | ICD-10-CM | POA: Diagnosis not present

## 2012-08-29 DIAGNOSIS — I5032 Chronic diastolic (congestive) heart failure: Secondary | ICD-10-CM | POA: Diagnosis not present

## 2012-08-29 DIAGNOSIS — E119 Type 2 diabetes mellitus without complications: Secondary | ICD-10-CM | POA: Diagnosis not present

## 2012-08-29 DIAGNOSIS — G4733 Obstructive sleep apnea (adult) (pediatric): Secondary | ICD-10-CM | POA: Diagnosis not present

## 2012-08-29 DIAGNOSIS — M159 Polyosteoarthritis, unspecified: Secondary | ICD-10-CM | POA: Diagnosis not present

## 2012-08-29 DIAGNOSIS — I10 Essential (primary) hypertension: Secondary | ICD-10-CM | POA: Diagnosis not present

## 2012-09-02 DIAGNOSIS — I509 Heart failure, unspecified: Secondary | ICD-10-CM | POA: Diagnosis not present

## 2012-09-02 DIAGNOSIS — I10 Essential (primary) hypertension: Secondary | ICD-10-CM | POA: Diagnosis not present

## 2012-09-02 DIAGNOSIS — I5032 Chronic diastolic (congestive) heart failure: Secondary | ICD-10-CM | POA: Diagnosis not present

## 2012-09-02 DIAGNOSIS — M159 Polyosteoarthritis, unspecified: Secondary | ICD-10-CM | POA: Diagnosis not present

## 2012-09-02 DIAGNOSIS — E119 Type 2 diabetes mellitus without complications: Secondary | ICD-10-CM | POA: Diagnosis not present

## 2012-09-02 DIAGNOSIS — G4733 Obstructive sleep apnea (adult) (pediatric): Secondary | ICD-10-CM | POA: Diagnosis not present

## 2012-09-06 DIAGNOSIS — I509 Heart failure, unspecified: Secondary | ICD-10-CM | POA: Diagnosis not present

## 2012-09-06 DIAGNOSIS — M159 Polyosteoarthritis, unspecified: Secondary | ICD-10-CM | POA: Diagnosis not present

## 2012-09-06 DIAGNOSIS — I5032 Chronic diastolic (congestive) heart failure: Secondary | ICD-10-CM | POA: Diagnosis not present

## 2012-09-06 DIAGNOSIS — G4733 Obstructive sleep apnea (adult) (pediatric): Secondary | ICD-10-CM | POA: Diagnosis not present

## 2012-09-06 DIAGNOSIS — I10 Essential (primary) hypertension: Secondary | ICD-10-CM | POA: Diagnosis not present

## 2012-09-06 DIAGNOSIS — E119 Type 2 diabetes mellitus without complications: Secondary | ICD-10-CM | POA: Diagnosis not present

## 2012-09-07 ENCOUNTER — Telehealth: Payer: Self-pay | Admitting: *Deleted

## 2012-09-07 ENCOUNTER — Ambulatory Visit (HOSPITAL_BASED_OUTPATIENT_CLINIC_OR_DEPARTMENT_OTHER)
Admission: RE | Admit: 2012-09-07 | Discharge: 2012-09-07 | Disposition: A | Discharge status: Home / Self Care | Payer: Medicare Other | Source: Ambulatory Visit | Attending: Internal Medicine | Admitting: Internal Medicine

## 2012-09-07 VITALS — BP 102/52 | HR 51 | Wt 269.8 lb

## 2012-09-07 DIAGNOSIS — E875 Hyperkalemia: Secondary | ICD-10-CM | POA: Diagnosis not present

## 2012-09-07 DIAGNOSIS — G4733 Obstructive sleep apnea (adult) (pediatric): Secondary | ICD-10-CM | POA: Diagnosis present

## 2012-09-07 DIAGNOSIS — M129 Arthropathy, unspecified: Secondary | ICD-10-CM | POA: Diagnosis present

## 2012-09-07 DIAGNOSIS — Z79899 Other long term (current) drug therapy: Secondary | ICD-10-CM | POA: Diagnosis not present

## 2012-09-07 DIAGNOSIS — N189 Chronic kidney disease, unspecified: Secondary | ICD-10-CM | POA: Diagnosis present

## 2012-09-07 DIAGNOSIS — M4800 Spinal stenosis, site unspecified: Secondary | ICD-10-CM | POA: Diagnosis present

## 2012-09-07 DIAGNOSIS — I6529 Occlusion and stenosis of unspecified carotid artery: Secondary | ICD-10-CM | POA: Diagnosis present

## 2012-09-07 DIAGNOSIS — A498 Other bacterial infections of unspecified site: Secondary | ICD-10-CM | POA: Diagnosis present

## 2012-09-07 DIAGNOSIS — Z6841 Body Mass Index (BMI) 40.0 and over, adult: Secondary | ICD-10-CM | POA: Diagnosis not present

## 2012-09-07 DIAGNOSIS — T8189XA Other complications of procedures, not elsewhere classified, initial encounter: Secondary | ICD-10-CM | POA: Diagnosis not present

## 2012-09-07 DIAGNOSIS — E662 Morbid (severe) obesity with alveolar hypoventilation: Secondary | ICD-10-CM | POA: Diagnosis not present

## 2012-09-07 DIAGNOSIS — Z794 Long term (current) use of insulin: Secondary | ICD-10-CM | POA: Diagnosis not present

## 2012-09-07 DIAGNOSIS — I5032 Chronic diastolic (congestive) heart failure: Secondary | ICD-10-CM

## 2012-09-07 DIAGNOSIS — I499 Cardiac arrhythmia, unspecified: Secondary | ICD-10-CM | POA: Diagnosis not present

## 2012-09-07 DIAGNOSIS — I2789 Other specified pulmonary heart diseases: Secondary | ICD-10-CM | POA: Diagnosis present

## 2012-09-07 DIAGNOSIS — I509 Heart failure, unspecified: Secondary | ICD-10-CM | POA: Diagnosis present

## 2012-09-07 DIAGNOSIS — E119 Type 2 diabetes mellitus without complications: Secondary | ICD-10-CM | POA: Diagnosis not present

## 2012-09-07 DIAGNOSIS — E872 Acidosis: Secondary | ICD-10-CM | POA: Diagnosis present

## 2012-09-07 DIAGNOSIS — I951 Orthostatic hypotension: Secondary | ICD-10-CM | POA: Diagnosis not present

## 2012-09-07 DIAGNOSIS — R002 Palpitations: Secondary | ICD-10-CM | POA: Diagnosis not present

## 2012-09-07 DIAGNOSIS — N390 Urinary tract infection, site not specified: Secondary | ICD-10-CM | POA: Diagnosis present

## 2012-09-07 DIAGNOSIS — L97509 Non-pressure chronic ulcer of other part of unspecified foot with unspecified severity: Secondary | ICD-10-CM | POA: Diagnosis present

## 2012-09-07 DIAGNOSIS — I129 Hypertensive chronic kidney disease with stage 1 through stage 4 chronic kidney disease, or unspecified chronic kidney disease: Secondary | ICD-10-CM | POA: Diagnosis present

## 2012-09-07 DIAGNOSIS — N179 Acute kidney failure, unspecified: Secondary | ICD-10-CM | POA: Diagnosis present

## 2012-09-07 DIAGNOSIS — I428 Other cardiomyopathies: Secondary | ICD-10-CM | POA: Diagnosis not present

## 2012-09-07 DIAGNOSIS — M159 Polyosteoarthritis, unspecified: Secondary | ICD-10-CM | POA: Diagnosis not present

## 2012-09-07 DIAGNOSIS — I10 Essential (primary) hypertension: Secondary | ICD-10-CM | POA: Diagnosis not present

## 2012-09-07 DIAGNOSIS — E1169 Type 2 diabetes mellitus with other specified complication: Secondary | ICD-10-CM | POA: Diagnosis present

## 2012-09-07 DIAGNOSIS — R0681 Apnea, not elsewhere classified: Secondary | ICD-10-CM | POA: Diagnosis not present

## 2012-09-07 NOTE — Progress Notes (Signed)
Patient ID: Richard Miu Gaetz, female   DOB: 12-07-1945, 67 y.o.   MRN: [id number] Primary Cardiologist:  Dr. Arvilla Meres PCP: Dr. Clarene Duke at [facility]  HPI:  N[place] Steelman is a 67 y.o. female with a history of morbid obesity complicated by obesity hypoventilation syndrome, hypertension, sleep apnea, diabetes, CRI and mild secondary pulmonary hypertension as well as hypokalemia. Carotid stenosis 40-59% bilaterally.  Initially, she had an echocardiogram read as severe LV dysfunction with an EF of 15-20%. She underwent catheterization in 2008 that showed minimal nonobstructive coronary artery disease with EF 55%. She had mild pulmonary hypertension with both pulmonary venous and pulmonary arterial components. Her PVR was 5.1 Wood units. Echo in 3/09 EF 60%.  Saw Dr. Antoine Poche in 8/11 over question of possible AF. ECG with PACs. 24 monitor NSR with occ PACs and PVCS. No AF.    She was hospitalized at the end of August 2012 for acute on chronic diastolic heart failure.  Diuresed ~17 lbs with discharge weight of 282 lbs.  Switched from ACE-I to ARB due to cough.  She was admitted 04/21/12-04/28/12 for acute on chronic diastolic HF and 25 pound weight gain.  She diuresed with IV lasix and discharge weight was 269 pounds.   She returns today for follow up today. Last visit volume status elevated and Torsemide increased to 40 mg daily and Metolazone 2.5 mg every Thursday. Breathing improved but remains SOB with exertion.  Denies PND/CP. +Orthopnea sleeps on 2 pillows. Can only place weight on her L foot to go to bath room. Followed weekly at outpatient wound center for chronic L toe wound. Weight at home trending down to 269-273. Uses BIPAP every night. Followed by [facility]. SBP 111-128.  Compliant with medications. Drinking < 2 liters per day. Tries to follow low salt diet.     ROS: All systems negative except as listed in HPI, PMH and Problem List.  Past Medical History  Diagnosis Date  . OSA  (obstructive sleep apnea)   . Thrombophlebitis of deep vein of leg   . DM (diabetes mellitus)   . CHF (congestive heart failure)     hx of EF 15-20% in 4/08,  echo 3/09 EF 60-65%;   echo 6/12: EF 55-60%, mild LAE  . Carotid stenosis     u/s 6/10: R 0-39%Ll 40-59%;  u/s 7/12: 40-59% bilateral (repeat in 7/13)  . HTN (hypertension)   . Morbid obesity   . Pulmonary HTN     mild,  cath 6/08 with PVR 5.1  . Hypokalemia   . 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 Spinal stenosis   . Obesity hypoventilation syndrome   . Cancer     hx of colon CA 2000  . Arthritis      Current Outpatient Prescriptions on File Prior to Encounter  Medication Sig Dispense Refill  . aspirin (BAYER ASPIRIN) 325 MG tablet Take 325 mg by mouth daily.        . carvedilol (COREG) 3.125 MG tablet Take 3.125 mg by mouth 2 (two) times daily with a meal.       . cyanocobalamin (,VITAMIN B-12,) 1000 MCG/ML injection Inject 1,000 mcg into the muscle every 30 (thirty) days. Pt states last dose at end of August      . insulin glargine (LANTUS) 100 UNIT/ML injection Inject 42-80 Units into the skin 2 (two) times daily. Pt uses 42 units in AM and 80 units in  PM      . insulin glulisine (APIDRA) 100 UNIT/ML injection Inject 10 Units into the skin 3 (three) times daily before meals.       Marland Kitchen LORazepam (ATIVAN) 1 MG tablet Take 1 mg by mouth 2 (two) times daily.        . metolazone (ZAROXOLYN) 2.5 MG tablet 2.5 mg once a week and as needed for bloating  10 tablet  3  . sertraline (ZOLOFT) 100 MG tablet Take 50 mg by mouth daily. Take 1/2 tab by mouth once daily      . simvastatin (ZOCOR) 40 MG tablet Take 1 tablet (40 mg total) by mouth at bedtime.  90 tablet  3  . spironolactone (ALDACTONE) 25 MG tablet Take 1 tablet (25 mg total) by mouth 2 (two) times daily.  60 tablet  6  . sulfamethoxazole-trimethoprim (BACTRIM DS) 800-160 MG per tablet Take 1 tablet by mouth 2 (two) times daily.      Marland Kitchen torsemide  (DEMADEX) 20 MG tablet Take 2 tablets (40 mg total) by mouth daily.  60 tablet  3  . Vitamin D, Ergocalciferol, (DRISDOL) 50000 UNITS CAPS Take 50,000 Units by mouth 2 (two) times a week. Take on Mon and Thurs.       No current facility-administered medications on file prior to encounter.   Allergies  Allergen Reactions  . Codeine     sleepy  . Meperidine Hcl     Increase in heart rate  . Metformin     Makes me sick     PHYSICAL EXAM: Filed Vitals:   09/07/12 1334  BP: 102/52  Pulse: 51  Weight: 269 lb 12 oz (122.358 kg)  SpO2: 97%   Weight: 275>285> 269 Pounds General: Well appearing. No resp difficulty Sister present HEENT: normal Neck: supple. JVP 5-6 difficult to tell due to body habitus.   Carotids 2+ bilaterally; no bruits. No lymphadenopathy or thryomegaly appreciated. Cor: PMI normal. Regular rate & rhythm. No rubs, gallops or murmurs. Lungs: clear Abdomen: obese,  nontender, mildly distended. No hepatosplenomegaly. No bruits or masses. Good bowel sounds. Extremities: No R and LLE edema. No cyanosis or clubbing.  TED hose in place bilaterally.  Left bandage big toe  Neuro: alert & orientedx3, cranial nerves grossly intact. Moves all 4 extremities w/o difficulty. Affect pleasant.     ASSESSMENT & PLAN:

## 2012-09-07 NOTE — Addendum Note (Signed)
Encounter addended by: Noralee Space, RN on: 09/07/2012  4:09 PM<BR>     Documentation filed: Orders

## 2012-09-07 NOTE — Telephone Encounter (Signed)
Pt enrolled for 21 day event monitor to be mailed to home address  09/07/12  TK

## 2012-09-07 NOTE — Patient Instructions (Addendum)
Follow in 1 month with Dr Gala Romney  Do the following things EVERYDAY: 1) Weigh yourself in the morning before breakfast. Write it down and keep it in a log. 2) Take your medicines as prescribed 3) Eat low salt foods-Limit salt (sodium) to 2000 mg per day.  4) Stay as active as you can everyday 5) Limit all fluids for the day to less than 2 liters

## 2012-09-07 NOTE — Assessment & Plan Note (Signed)
Volume status much improved. Weight down 15 pounds. Continue current diuretic regimen. Continue A[facility] to assist with HF management. Add PT. AHC to check BMET 09/08/12. Reinforced daily weights, low salt food choices, and limiting fluid intake to < 2 liters. Follow up in 1 month.

## 2012-09-08 ENCOUNTER — Telehealth (HOSPITAL_COMMUNITY): Payer: Self-pay | Admitting: Adult Health

## 2012-09-08 DIAGNOSIS — L97509 Non-pressure chronic ulcer of other part of unspecified foot with unspecified severity: Secondary | ICD-10-CM | POA: Diagnosis not present

## 2012-09-08 DIAGNOSIS — I5032 Chronic diastolic (congestive) heart failure: Secondary | ICD-10-CM

## 2012-09-08 DIAGNOSIS — E1169 Type 2 diabetes mellitus with other specified complication: Secondary | ICD-10-CM | POA: Diagnosis not present

## 2012-09-08 NOTE — Telephone Encounter (Signed)
Yesterday , [name] sister called regarding [name] unsteady gait and palpitations. She is unsure if her sister can live at home with her husband.   Will place 2 week event monitor. Ask SW from [facility] to follow up regarding possible transition to assisted living versus SNF.   Alonnie Bieker  NP-C  8:36 AM.

## 2012-09-09 DIAGNOSIS — E119 Type 2 diabetes mellitus without complications: Secondary | ICD-10-CM | POA: Diagnosis not present

## 2012-09-09 DIAGNOSIS — I5032 Chronic diastolic (congestive) heart failure: Secondary | ICD-10-CM | POA: Diagnosis not present

## 2012-09-09 DIAGNOSIS — G4733 Obstructive sleep apnea (adult) (pediatric): Secondary | ICD-10-CM | POA: Diagnosis not present

## 2012-09-09 DIAGNOSIS — I509 Heart failure, unspecified: Secondary | ICD-10-CM | POA: Diagnosis not present

## 2012-09-09 DIAGNOSIS — I10 Essential (primary) hypertension: Secondary | ICD-10-CM | POA: Diagnosis not present

## 2012-09-09 DIAGNOSIS — M159 Polyosteoarthritis, unspecified: Secondary | ICD-10-CM | POA: Diagnosis not present

## 2012-09-10 ENCOUNTER — Emergency Department (HOSPITAL_COMMUNITY): Payer: Medicare Other

## 2012-09-10 ENCOUNTER — Inpatient Hospital Stay (HOSPITAL_COMMUNITY): Payer: Medicare Other

## 2012-09-10 ENCOUNTER — Encounter (HOSPITAL_COMMUNITY): Payer: Self-pay | Admitting: Emergency Medicine

## 2012-09-10 ENCOUNTER — Inpatient Hospital Stay (HOSPITAL_COMMUNITY)
Admission: EM | Admit: 2012-09-10 | Discharge: 2012-09-12 | DRG: 641 | Disposition: A | Discharge status: Home Health | Payer: Medicare Other | Attending: Internal Medicine | Admitting: Internal Medicine

## 2012-09-10 DIAGNOSIS — I10 Essential (primary) hypertension: Secondary | ICD-10-CM

## 2012-09-10 DIAGNOSIS — E872 Acidosis, unspecified: Secondary | ICD-10-CM | POA: Diagnosis present

## 2012-09-10 DIAGNOSIS — M129 Arthropathy, unspecified: Secondary | ICD-10-CM | POA: Diagnosis present

## 2012-09-10 DIAGNOSIS — E662 Morbid (severe) obesity with alveolar hypoventilation: Secondary | ICD-10-CM | POA: Diagnosis present

## 2012-09-10 DIAGNOSIS — E875 Hyperkalemia: Principal | ICD-10-CM

## 2012-09-10 DIAGNOSIS — N179 Acute kidney failure, unspecified: Secondary | ICD-10-CM

## 2012-09-10 DIAGNOSIS — I5032 Chronic diastolic (congestive) heart failure: Secondary | ICD-10-CM

## 2012-09-10 DIAGNOSIS — Z794 Long term (current) use of insulin: Secondary | ICD-10-CM

## 2012-09-10 DIAGNOSIS — M4800 Spinal stenosis, site unspecified: Secondary | ICD-10-CM | POA: Diagnosis present

## 2012-09-10 DIAGNOSIS — A498 Other bacterial infections of unspecified site: Secondary | ICD-10-CM | POA: Diagnosis present

## 2012-09-10 DIAGNOSIS — R0681 Apnea, not elsewhere classified: Secondary | ICD-10-CM | POA: Diagnosis not present

## 2012-09-10 DIAGNOSIS — I129 Hypertensive chronic kidney disease with stage 1 through stage 4 chronic kidney disease, or unspecified chronic kidney disease: Secondary | ICD-10-CM | POA: Diagnosis present

## 2012-09-10 DIAGNOSIS — N390 Urinary tract infection, site not specified: Secondary | ICD-10-CM | POA: Diagnosis present

## 2012-09-10 DIAGNOSIS — N189 Chronic kidney disease, unspecified: Secondary | ICD-10-CM | POA: Diagnosis present

## 2012-09-10 DIAGNOSIS — I2789 Other specified pulmonary heart diseases: Secondary | ICD-10-CM | POA: Diagnosis present

## 2012-09-10 DIAGNOSIS — E1169 Type 2 diabetes mellitus with other specified complication: Secondary | ICD-10-CM | POA: Diagnosis present

## 2012-09-10 DIAGNOSIS — I509 Heart failure, unspecified: Secondary | ICD-10-CM

## 2012-09-10 DIAGNOSIS — Z6841 Body Mass Index (BMI) 40.0 and over, adult: Secondary | ICD-10-CM

## 2012-09-10 DIAGNOSIS — I6529 Occlusion and stenosis of unspecified carotid artery: Secondary | ICD-10-CM | POA: Diagnosis present

## 2012-09-10 DIAGNOSIS — L97509 Non-pressure chronic ulcer of other part of unspecified foot with unspecified severity: Secondary | ICD-10-CM | POA: Diagnosis present

## 2012-09-10 DIAGNOSIS — G4733 Obstructive sleep apnea (adult) (pediatric): Secondary | ICD-10-CM | POA: Diagnosis present

## 2012-09-10 DIAGNOSIS — Z79899 Other long term (current) drug therapy: Secondary | ICD-10-CM

## 2012-09-10 DIAGNOSIS — E119 Type 2 diabetes mellitus without complications: Secondary | ICD-10-CM

## 2012-09-10 DIAGNOSIS — I428 Other cardiomyopathies: Secondary | ICD-10-CM

## 2012-09-10 DIAGNOSIS — I499 Cardiac arrhythmia, unspecified: Secondary | ICD-10-CM

## 2012-09-10 DIAGNOSIS — T502X5A Adverse effect of carbonic-anhydrase inhibitors, benzothiadiazides and other diuretics, initial encounter: Secondary | ICD-10-CM | POA: Diagnosis present

## 2012-09-10 LAB — BASIC METABOLIC PANEL
CO2: 16 mEq/L — ABNORMAL LOW (ref 19–32)
Calcium: 9.8 mg/dL (ref 8.4–10.5)
Chloride: 104 mEq/L (ref 96–112)
GFR calc Af Amer: 9 mL/min — ABNORMAL LOW (ref >90)
GFR calc Af Amer: 9 mL/min — ABNORMAL LOW (ref >90)
GFR calc non Af Amer: 8 mL/min — ABNORMAL LOW (ref >90)
Glucose, Bld: 156 mg/dL — ABNORMAL HIGH (ref 70–99)
Potassium: 7 mEq/L (ref 3.5–5.1)
Potassium: 7.4 mEq/L (ref 3.5–5.1)
Sodium: 133 mEq/L — ABNORMAL LOW (ref 135–145)
Sodium: 131 mEq/L — ABNORMAL LOW (ref 135–145)

## 2012-09-10 LAB — CBC WITH DIFFERENTIAL/PLATELET
Basophils Absolute: 0 10*3/uL (ref 0.0–0.1)
Basophils Relative: 0 % (ref 0–1)
Eosinophils Relative: 5 % (ref 0–5)
HCT: 29.4 % — ABNORMAL LOW (ref 36.0–46.0)
MCHC: 33 g/dL (ref 30.0–36.0)
MCV: 87.2 fL (ref 78.0–100.0)
Monocytes Absolute: 1.1 10*3/uL — ABNORMAL HIGH (ref 0.1–1.0)
Platelets: 180 10*3/uL (ref 150–400)
RDW: 15 % (ref 11.5–15.5)

## 2012-09-10 LAB — RENAL FUNCTION PANEL
Albumin: 3.3 g/dL — ABNORMAL LOW (ref 3.5–5.2)
BUN: 79 mg/dL — ABNORMAL HIGH (ref 6–23)
CO2: 16 mEq/L — ABNORMAL LOW (ref 19–32)
Calcium: 9.8 mg/dL (ref 8.4–10.5)
Chloride: 105 mEq/L (ref 96–112)
Creatinine, Ser: 4.98 mg/dL — ABNORMAL HIGH (ref 0.50–1.10)
GFR calc non Af Amer: 8 mL/min — ABNORMAL LOW (ref >90)

## 2012-09-10 LAB — APTT: aPTT: 34 seconds (ref 24–37)

## 2012-09-10 LAB — COMPREHENSIVE METABOLIC PANEL
AST: 20 U/L (ref 0–37)
Albumin: 3.4 g/dL — ABNORMAL LOW (ref 3.5–5.2)
Calcium: 9.6 mg/dL (ref 8.4–10.5)
Creatinine, Ser: 5.24 mg/dL — ABNORMAL HIGH (ref 0.50–1.10)
Sodium: 133 mEq/L — ABNORMAL LOW (ref 135–145)
Total Protein: 7.7 g/dL (ref 6.0–8.3)

## 2012-09-10 LAB — CBC
Platelets: 154 10*3/uL (ref 150–400)
RBC: 3.2 MIL/uL — ABNORMAL LOW (ref 3.87–5.11)
RDW: 15 % (ref 11.5–15.5)
WBC: 10.4 10*3/uL (ref 4.0–10.5)

## 2012-09-10 LAB — GLUCOSE, CAPILLARY
Glucose-Capillary: 144 mg/dL — ABNORMAL HIGH (ref 70–99)
Glucose-Capillary: 155 mg/dL — ABNORMAL HIGH (ref 70–99)

## 2012-09-10 LAB — MRSA PCR SCREENING: MRSA by PCR: NEGATIVE

## 2012-09-10 LAB — PROTIME-INR: INR: 1.39 (ref 0.00–1.49)

## 2012-09-10 MED ORDER — DEXTROSE 50 % IV SOLN
INTRAVENOUS | Status: AC
  Filled 2012-09-10: qty 50

## 2012-09-10 MED ORDER — SODIUM CHLORIDE 0.9 % IV SOLN
INTRAVENOUS | Status: DC
  Administered 2012-09-10: 03:00:00 via INTRAVENOUS

## 2012-09-10 MED ORDER — INSULIN GLARGINE 100 UNIT/ML ~~LOC~~ SOLN
42.0000 [IU] | Freq: Every day | SUBCUTANEOUS | Status: DC
  Administered 2012-09-10 – 2012-09-12 (×3): 42 [IU] via SUBCUTANEOUS

## 2012-09-10 MED ORDER — SODIUM CHLORIDE 0.9 % IV SOLN: INTRAVENOUS | Status: DC

## 2012-09-10 MED ORDER — VITAMIN D (ERGOCALCIFEROL) 1.25 MG (50000 UNIT) PO CAPS
50000.0000 [IU] | ORAL_CAPSULE | ORAL | Status: DC
Start: 2012-09-12 — End: 2012-09-12
  Administered 2012-09-12: 50000 [IU] via ORAL
  Filled 2012-09-10: qty 1

## 2012-09-10 MED ORDER — ASPIRIN 325 MG PO TABS
325.0000 mg | ORAL_TABLET | Freq: Every day | ORAL | Status: DC
  Administered 2012-09-10 – 2012-09-12 (×3): 325 mg via ORAL
  Filled 2012-09-10 (×3): qty 1

## 2012-09-10 MED ORDER — CALCIUM GLUCONATE 10 % IV SOLN
1.0000 g | Freq: Once | INTRAVENOUS | Status: AC
  Administered 2012-09-10: 1 g via INTRAVENOUS
  Filled 2012-09-10: qty 10

## 2012-09-10 MED ORDER — SODIUM CHLORIDE 0.9 % IJ SOLN
3.0000 mL | Freq: Two times a day (BID) | INTRAMUSCULAR | Status: DC
  Administered 2012-09-10: 3 mL via INTRAVENOUS

## 2012-09-10 MED ORDER — SODIUM POLYSTYRENE SULFONATE 15 GM/60ML PO SUSP
30.0000 g | Freq: Once | ORAL | Status: AC
  Administered 2012-09-10: 30 g via ORAL
  Filled 2012-09-10: qty 120

## 2012-09-10 MED ORDER — INSULIN GLARGINE 100 UNIT/ML ~~LOC~~ SOLN: 42.0000 [IU] | Freq: Two times a day (BID) | SUBCUTANEOUS | Status: DC

## 2012-09-10 MED ORDER — FUROSEMIDE 10 MG/ML IJ SOLN
80.0000 mg | Freq: Once | INTRAMUSCULAR | Status: AC
  Administered 2012-09-10: 80 mg via INTRAVENOUS
  Filled 2012-09-10: qty 8

## 2012-09-10 MED ORDER — CARVEDILOL 3.125 MG PO TABS
3.1250 mg | ORAL_TABLET | Freq: Two times a day (BID) | ORAL | Status: DC
  Administered 2012-09-10 – 2012-09-12 (×5): 3.125 mg via ORAL
  Filled 2012-09-10 (×10): qty 1

## 2012-09-10 MED ORDER — CYANOCOBALAMIN 1000 MCG/ML IJ SOLN
1000.0000 ug | INTRAMUSCULAR | Status: DC
  Filled 2012-09-10: qty 1

## 2012-09-10 MED ORDER — INSULIN ASPART 100 UNIT/ML ~~LOC~~ SOLN
8.0000 [IU] | Freq: Once | SUBCUTANEOUS | Status: AC
  Administered 2012-09-10: 8 [IU] via SUBCUTANEOUS
  Filled 2012-09-10: qty 1

## 2012-09-10 MED ORDER — SIMVASTATIN 40 MG PO TABS
40.0000 mg | ORAL_TABLET | Freq: Every day | ORAL | Status: DC
  Administered 2012-09-10 – 2012-09-11 (×2): 40 mg via ORAL
  Filled 2012-09-10 (×3): qty 1

## 2012-09-10 MED ORDER — SODIUM BICARBONATE 8.4 % IV SOLN
INTRAVENOUS | Status: DC
  Administered 2012-09-10 – 2012-09-11 (×2): via INTRAVENOUS
  Filled 2012-09-10 (×4): qty 150

## 2012-09-10 MED ORDER — INSULIN ASPART 100 UNIT/ML ~~LOC~~ SOLN
10.0000 [IU] | Freq: Once | SUBCUTANEOUS | Status: AC
  Administered 2012-09-10: 10 [IU] via SUBCUTANEOUS

## 2012-09-10 MED ORDER — SODIUM CHLORIDE 0.9 % IV SOLN
1.0000 g | Freq: Once | INTRAVENOUS | Status: AC
  Administered 2012-09-10: 1 g via INTRAVENOUS
  Filled 2012-09-10: qty 10

## 2012-09-10 MED ORDER — DEXTROSE 50 % IV SOLN
1.0000 | Freq: Once | INTRAVENOUS | Status: AC
  Administered 2012-09-10: 50 mL via INTRAVENOUS

## 2012-09-10 MED ORDER — DEXTROSE 50 % IV SOLN
50.0000 mL | Freq: Once | INTRAVENOUS | Status: AC
  Administered 2012-09-10: 50 mL via INTRAVENOUS
  Filled 2012-09-10: qty 50

## 2012-09-10 MED ORDER — LORAZEPAM 1 MG PO TABS
1.0000 mg | ORAL_TABLET | Freq: Two times a day (BID) | ORAL | Status: DC
  Administered 2012-09-10 – 2012-09-12 (×5): 1 mg via ORAL
  Filled 2012-09-10 (×5): qty 1

## 2012-09-10 MED ORDER — HEPARIN SODIUM (PORCINE) 5000 UNIT/ML IJ SOLN
5000.0000 [IU] | Freq: Three times a day (TID) | INTRAMUSCULAR | Status: DC
  Administered 2012-09-10: 5000 [IU] via SUBCUTANEOUS
  Filled 2012-09-10 (×4): qty 1

## 2012-09-10 MED ORDER — INSULIN ASPART 100 UNIT/ML ~~LOC~~ SOLN
10.0000 [IU] | Freq: Three times a day (TID) | SUBCUTANEOUS | Status: DC
  Administered 2012-09-10 – 2012-09-12 (×7): 10 [IU] via SUBCUTANEOUS

## 2012-09-10 MED ORDER — INSULIN GLARGINE 100 UNIT/ML ~~LOC~~ SOLN
80.0000 [IU] | Freq: Every day | SUBCUTANEOUS | Status: DC
  Administered 2012-09-11: 80 [IU] via SUBCUTANEOUS

## 2012-09-10 MED ORDER — SERTRALINE HCL 50 MG PO TABS
50.0000 mg | ORAL_TABLET | Freq: Every day | ORAL | Status: DC
  Administered 2012-09-10 – 2012-09-12 (×3): 50 mg via ORAL
  Filled 2012-09-10 (×3): qty 1

## 2012-09-10 MED ORDER — SODIUM POLYSTYRENE SULFONATE 15 GM/60ML PO SUSP
60.0000 g | Freq: Once | ORAL | Status: AC
  Administered 2012-09-10: 60 g via ORAL
  Filled 2012-09-10: qty 240

## 2012-09-10 NOTE — H&P (Signed)
[facility] History and Physical  Dana Streiff BMW:[id number] DOB: 1945/11/01 DOA: 09/10/2012  Referring physician: ED PCP: Aida Puffer, MD  Specialists: None  Chief Complaint: Abnormal lab  HPI: [name] is a 67 y.o. female with a diabetic left foot ulcer and h/o CHF presenting from home after blood work at her physicians office taken yesterday.  Physicians office called patient at 11pm and told her to come emergently to the ED for an elevated potassium.  The patient thankfully is asymptomatic with no CP or SOB, no h/o the same in the past, no known kidney problems.  The patient is on multiple diuretics (including spironolactone), and recently was started on bactrim for a UTI x4 weeks now.  No f/c or recent illness otherwise, no changes in L foot ulcer.  In the ED her potassium was 7.0 and her creatinine was 5.24.  Hospitalist has been asked to admit.  Review of Systems: 12 systems reviewed and otherwise negative.  Past Medical History  Diagnosis Date  . OSA (obstructive sleep apnea)   . Thrombophlebitis of deep vein of leg   . DM (diabetes mellitus)   . CHF (congestive heart failure)     hx of EF 15-20% in 4/08,  echo 3/09 EF 60-65%;   echo 6/12: EF 55-60%, mild LAE  . Carotid stenosis     u/s 6/10: R 0-39%Ll 40-59%;  u/s 7/12: 40-59% bilateral (repeat in 7/13)  . HTN (hypertension)   . Morbid obesity   . Pulmonary HTN     mild,  cath 6/08 with PVR 5.1  . Hypokalemia   . 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 Spinal stenosis   . Obesity hypoventilation syndrome   . Cancer     hx of colon CA 2000  . Arthritis    Past Surgical History  Procedure Laterality Date  . Carpal tunnel release  07/2007  . Colon cancer with resection  2000  . Cholecystectomy  2002  . Vaginal hysterectomy  1983   Social History:  reports that she has never smoked. She has never used smokeless tobacco. She reports that she does not drink alcohol or use illicit  drugs.   Allergies  Allergen Reactions  . Codeine Other (See Comments)    Causes confusion  . Meperidine Hcl Other (See Comments)    Increase in heart rate  . Metformin Nausea And Vomiting    Family History  Problem Relation Age of Onset  . Coronary artery disease Father     Prior to Admission medications   Medication Sig Start Date End Date Taking? Authorizing Provider  aspirin (BAYER ASPIRIN) 325 MG tablet Take 325 mg by mouth daily.     Yes Historical Provider, MD  carvedilol (COREG) 3.125 MG tablet Take 3.125 mg by mouth 2 (two) times daily with a meal.    Yes Historical Provider, MD  insulin glargine (LANTUS) 100 UNIT/ML injection Inject 42-80 Units into the skin 2 (two) times daily. Pt uses 42 units in AM and 80 units in PM   Yes Historical Provider, MD  insulin glulisine (APIDRA) 100 UNIT/ML injection Inject 10 Units into the skin 3 (three) times daily before meals.    Yes Historical Provider, MD  LORazepam (ATIVAN) 1 MG tablet Take 1 mg by mouth 2 (two) times daily.     Yes Historical Provider, MD  sertraline (ZOLOFT) 100 MG tablet Take 50 mg by mouth daily. Take 1/2 tab by  mouth once daily   Yes Historical Provider, MD  simvastatin (ZOCOR) 40 MG tablet Take 1 tablet (40 mg total) by mouth at bedtime. 01/12/12  Yes Dolores Patty, MD  spironolactone (ALDACTONE) 25 MG tablet Take 1 tablet (25 mg total) by mouth 2 (two) times daily. 04/14/12  Yes Dolores Patty, MD  sulfamethoxazole-trimethoprim (BACTRIM DS) 800-160 MG per tablet Take 1 tablet by mouth 2 (two) times daily.   Yes Historical Provider, MD  torsemide (DEMADEX) 20 MG tablet Take 2 tablets (40 mg total) by mouth daily. 08/25/12  Yes Amy D Clegg, NP  cyanocobalamin (,VITAMIN B-12,) 1000 MCG/ML injection Inject 1,000 mcg into the muscle every 30 (thirty) days. Pt states last dose at end of August    Historical Provider, MD  metolazone (ZAROXOLYN) 2.5 MG tablet 2.5 mg once a week and as needed for bloating 08/25/12   Amy  D Clegg, NP  Vitamin D, Ergocalciferol, (DRISDOL) 50000 UNITS CAPS Take 50,000 Units by mouth 2 (two) times a week. Take on Mon and Thurs.    Historical Provider, MD   Physical Exam: Filed Vitals:   09/10/12 0345 09/10/12 0400 09/10/12 0415 09/10/12 0430  BP: 160/38 166/68 170/86 165/72  Pulse:      Temp:      TempSrc:      Resp: 21 23 23 23   SpO2:        General:  NAD, resting comfortably in bed Eyes: PEERLA EOMI ENT: mucous membranes moist Neck: supple w/o JVD Cardiovascular: RRR w/o MRG Respiratory: CTA B Abdomen: soft, nt, nd, bs+ Skin: 3+ BLE edema Musculoskeletal: MAE, full ROM all 4 extremities Psychiatric: normal tone and affect Neurologic: AAOx3, grossly non-focal  Labs on Admission:  Basic Metabolic Panel:  Recent Labs Lab 09/10/12 0134  NA 133*  K 7.0*  CL 103  CO2 16*  GLUCOSE 122*  BUN 85*  CREATININE 5.24*  CALCIUM 9.6   Liver Function Tests:  Recent Labs Lab 09/10/12 0134  AST 20  ALT 24  ALKPHOS 72  BILITOT 0.1*  PROT 7.7  ALBUMIN 3.4*   No results found for this basename: LIPASE, AMYLASE,  in the last 168 hours No results found for this basename: AMMONIA,  in the last 168 hours CBC:  Recent Labs Lab 09/10/12 0134  WBC 10.0  NEUTROABS 6.2  HGB 9.7*  HCT 29.4*  MCV 87.2  PLT 180   Cardiac Enzymes: No results found for this basename: CKTOTAL, CKMB, CKMBINDEX, TROPONINI,  in the last 168 hours  BNP (last 3 results)  Recent Labs  04/21/12 1200 04/28/12 0615  PROBNP 1055.0* 331.5*   CBG: No results found for this basename: GLUCAP,  in the last 168 hours  Radiological Exams on Admission: Dg Chest Portable 1 View  09/10/2012  *RADIOLOGY REPORT*  Clinical Data: Abnormal labs.  Apnea.  PORTABLE CHEST - 1 VIEW  Comparison: 03/24/2012  Findings: Cardiac enlargement with mild to pulmonary vascular congestion.  No edema or consolidation.  No blunting of costophrenic angles.  No pneumothorax.  Mediastinal contours appear intact.   Tortuous aorta.  No significant change since previous study.  IMPRESSION: Cardiac enlargement with mild pulmonary vascular prominence.  No edema or consolidation.   Original Report Authenticated By: Burman Nieves, M.D.     EKG: Independently reviewed. Tall Peaked T waves are noted which are new from prior EKGs.  Assessment/Plan Active Problems:   DM   CARDIOMYOPATHY, PRIMARY, DILATED   Acute kidney failure   Hyperkalemia  1. Hyperkalemia - secondary to K sparing diuretics and AKF, treating with insulin, glucose, calcium, kayexelate, repeat at 5 am, keep close eye on EKG as she did have tall peaked T waves initially in ED.  Sending patient to stepdown for closer monitoring. 2. AKF - likely due to nephrotoxic agents including 4 weeks of bactrim, holding bactrim, diuretics, checking urine lytes, checking renal ultrasound, strict intake and output, renal diet for low K. 3. DM2 - continue home insulin regimin for now, acuchecks AC/HS 4. Cardiomyopathy - gentle hydration with only 75 cc/hr of NS, feel patient may be slightly dry but dehydration not likely to be driving her AKI. 5. HTN - continue home meds other than diuretics (carvedilol), may need to add hydralazine or labetalol if BP remains high.    Code Status: Full Code (must indicate code status--if unknown or must be presumed, indicate so) Family Communication: Spoke with daughter at bedside (indicate person spoken with, if applicable, with phone number if by telephone) Disposition Plan: Admit to stepdown (indicate anticipated LOS)  Time spent: 70 min  Varnell Donate M. [facility] Pager [phone]  If 7PM-7AM, please contact night-coverage [email or web address] Password [facility] 09/10/2012, 4:54 AM

## 2012-09-10 NOTE — ED Notes (Signed)
PT. RECEIVED A CALL FROM HER HOME HEALTH  NURSE THIS EVENING , ADVISED HER TO GO TO ER DUE TO ELEVATED BLOOD POTTASIUM , REPORTS SLIGHT HEADACHE , DENIES CHEST DISCOMFORT /RESPIRATIONS UNLABORED.

## 2012-09-10 NOTE — ED Notes (Signed)
Received critical from lab -- Potassium is 7.0.

## 2012-09-10 NOTE — ED Notes (Signed)
Report given to rn.  Pt remains in [place] but will take upstairs as soon as pt is finished with ultra sound.

## 2012-09-10 NOTE — Consult Note (Signed)
Reason for Consult:hyperkalemia and AKI/CKD Referring Physician: Lollie Marrow [name] is an 67 y.o. female.  HPI: Pt is a 67yo AAF with PMH sig for dilated CMP (EF 15-20%), DM, HTN, OSA, pulm HTN, CKD, and obesity who was recently started on bactrim DS for the last 3 weeks due to refractory E Coli UTI (Dr. Vernie Ammons).  She was having weakness/dizziness and malaise earlier this week and had diarrhea for the last 2 days PTA.  Dr. Leanord Hawking of wound care ordered labs and she later received a call to go to the [facility] because of hyperkalemia and AKI.  Of note, she has been maintained on spironalactone and has had h/o hypokalemia in the past.  We were asked to see the patient due to persistent hyperkalemia despite medical therapy.  Trend in Creatinine:  Creatinine, Ser  Date/Time Value Range Status  09/10/2012 12:04 PM 5.00* 0.50 - 1.10 mg/dL Final  7/84/6962  9:52 AM 5.14* 0.50 - 1.10 mg/dL Final  8/41/3244  0:10 AM 5.24* 0.50 - 1.10 mg/dL Final  27/25/3664  4:03 AM 1.29* 0.50 - 1.10 mg/dL Final  47/10/2593  6:38 AM 1.24* 0.50 - 1.10 mg/dL Final  75/12/4330  9:51 AM 1.15* 0.50 - 1.10 mg/dL Final  88/10/1658  6:30 AM 1.17* 0.50 - 1.10 mg/dL Final  16/0/1093  2:35 AM 1.04  0.50 - 1.10 mg/dL Final  57/09/2200  5:42 AM 0.92  0.50 - 1.10 mg/dL Final  70/12/2374  2:83 AM 0.88  0.50 - 1.10 mg/dL Final  15/07/7614  0:73 PM 1.09  0.50 - 1.10 mg/dL Final  71/0/6269 48:54 PM 1.03  0.50 - 1.10 mg/dL Final  01/13/349 09:38 AM 1.12* 0.50 - 1.10 mg/dL Final  18/08/9935 16:96 PM 1.30* 0.50 - 1.10 mg/dL Final  7/89/3810  1:75 PM 1.10  0.50 - 1.10 mg/dL Final  07/21/5850  7:78 AM 1.57* 0.50 - 1.10 mg/dL Final  2/42/3536  1:44 PM 1.60* 0.50 - 1.10 mg/dL Final  10/02/4006  6:76 PM 1.40* 0.50 - 1.10 mg/dL Final  1/95/0932  6:71 AM 0.99  0.50 - 1.10 mg/dL Final  2/45/8099  8:33 PM 0.98  0.50 - 1.10 mg/dL Final  03/13/538  7:67 AM 0.98  0.50 - 1.10 mg/dL Final  3/41/9379  0:24 PM 0.85  0.50 - 1.10 mg/dL Final  0/97/3532  99:24 AM 1.2  0.4 - 1.2 mg/dL Final  08/26/8339  9:62 PM 1.1  0.4 - 1.2 mg/dL Final  08/21/9796 92:11 AM 1.3* 0.4 - 1.2 mg/dL Final  03/24/1739 81:44 PM 2.0* 0.4 - 1.2 mg/dL Final  03/06/5630 49:70 AM 1.7* 0.4 - 1.2 mg/dL Final  2/63/7858 85:02 PM 1.5* 0.4 - 1.2 mg/dL Final  7/74/1287 86:76 PM 1.2  0.4 - 1.2 mg/dL Final  02/05/9469 96:28 PM 1.2  0.4 - 1.2 mg/dL Final  3/66/2947 65:46 AM 1.1  0.4 - 1.2 mg/dL Final  11/20/5463 68:12 PM 1.0  0.4 - 1.2 mg/dL Final  7/51/7001  7:49 PM 1.61* 0.4 - 1.2 mg/dL Final  4/49/6759  1:63 AM 1.1  0.4-1.2 mg/dL Final  8/46/6599 35:70 AM 1.0  0.4-1.2 mg/dL Final  1/77/9390  3:00 PM 1.0  0.4-1.2 mg/dL Final  04/11/3006 62:26 PM 1.1  0.4-1.2 mg/dL Final  3/33/5456 25:63 AM 0.9  0.4-1.2 mg/dL Final  8/93/7342 87:68 PM 1.0  0.4-1.2 mg/dL Final  07/20/5724  2:03 AM 1.1  0.4-1.2 mg/dL Final  11/22/9739 63:84 AM 1.5* 0.4-1.2 mg/dL Final  5/36/4680  3:21 PM 1.5* 0.4-1.2 mg/dL Final  09/12/8248  3:00 PM 0.93   Final  05/02/2007  2:44 PM 1.2  0.4-1.2 mg/dL Final  1/61/0960 45:40 PM 1.0  0.4-1.2 mg/dL Final  9/81/1914  7:82 AM 0.9  0.4-1.2 mg/dL Final  9/56/2130  8:65 PM 1.2  0.4-1.2 mg/dL Final    PMH:   Past Medical History  Diagnosis Date  . OSA (obstructive sleep apnea)   . Thrombophlebitis of deep vein of leg   . DM (diabetes mellitus)   . CHF (congestive heart failure)     hx of EF 15-20% in 4/08,  echo 3/09 EF 60-65%;   echo 6/12: EF 55-60%, mild LAE  . Carotid stenosis     u/s 6/10: R 0-39%Ll 40-59%;  u/s 7/12: 40-59% bilateral (repeat in 7/13)  . HTN (hypertension)   . Morbid obesity   . Pulmonary HTN     mild,  cath 6/08 with PVR 5.1  . Hypokalemia   . 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 Spinal stenosis   . Obesity hypoventilation syndrome   . Cancer     hx of colon CA 2000  . Arthritis     PSH:   Past Surgical History  Procedure Laterality Date  . Carpal tunnel release  07/2007  . Colon cancer with resection  2000  .  Cholecystectomy  2002  . Vaginal hysterectomy  1983    Allergies:  Allergies  Allergen Reactions  . Codeine Other (See Comments)    Causes confusion  . Meperidine Hcl Other (See Comments)    Increase in heart rate  . Metformin Nausea And Vomiting    Medications:   Prior to Admission medications   Medication Sig Start Date End Date Taking? Authorizing Provider  aspirin (BAYER ASPIRIN) 325 MG tablet Take 325 mg by mouth daily.     Yes Historical Provider, MD  carvedilol (COREG) 3.125 MG tablet Take 3.125 mg by mouth 2 (two) times daily with a meal.    Yes Historical Provider, MD  insulin glargine (LANTUS) 100 UNIT/ML injection Inject 42-80 Units into the skin 2 (two) times daily. Pt uses 42 units in AM and 80 units in PM   Yes Historical Provider, MD  insulin glulisine (APIDRA) 100 UNIT/ML injection Inject 10 Units into the skin 3 (three) times daily before meals.    Yes Historical Provider, MD  LORazepam (ATIVAN) 1 MG tablet Take 1 mg by mouth 2 (two) times daily.     Yes Historical Provider, MD  sertraline (ZOLOFT) 100 MG tablet Take 50 mg by mouth daily. Take 1/2 tab by mouth once daily   Yes Historical Provider, MD  simvastatin (ZOCOR) 40 MG tablet Take 1 tablet (40 mg total) by mouth at bedtime. 01/12/12  Yes Dolores Patty, MD  spironolactone (ALDACTONE) 25 MG tablet Take 1 tablet (25 mg total) by mouth 2 (two) times daily. 04/14/12  Yes Dolores Patty, MD  sulfamethoxazole-trimethoprim (BACTRIM DS) 800-160 MG per tablet Take 1 tablet by mouth 2 (two) times daily.   Yes Historical Provider, MD  torsemide (DEMADEX) 20 MG tablet Take 2 tablets (40 mg total) by mouth daily. 08/25/12  Yes Amy D Clegg, NP  cyanocobalamin (,VITAMIN B-12,) 1000 MCG/ML injection Inject 1,000 mcg into the muscle every 30 (thirty) days. Pt states last dose at end of August    Historical Provider, MD  metolazone (ZAROXOLYN) 2.5 MG tablet 2.5 mg once a week and as needed for bloating 08/25/12   Amy  Georgie Chard, NP   Vitamin D, Ergocalciferol, (DRISDOL) 50000 UNITS CAPS Take 50,000 Units by mouth 2 (two) times a week. Take on Mon and Thurs.    Historical Provider, MD    Inpatient medications: . aspirin  325 mg Oral Daily  . calcium gluconate  1 g Intravenous Once  . carvedilol  3.125 mg Oral BID WC  . [START ON 09/11/2012] cyanocobalamin  1,000 mcg Intramuscular Q30 days  . dextrose  1 ampule Intravenous Once  . dextrose      . heparin  5,000 Units Subcutaneous Q8H  . insulin aspart  10 Units Subcutaneous TID WC  . insulin aspart  10 Units Subcutaneous Once  . insulin glargine  42 Units Subcutaneous Daily   And  . insulin glargine  80 Units Subcutaneous QHS  . LORazepam  1 mg Oral BID  . sertraline  50 mg Oral Daily  . simvastatin  40 mg Oral QHS  . sodium chloride  3 mL Intravenous Q12H  . [START ON 09/12/2012] Vitamin D (Ergocalciferol)  50,000 Units Oral 2 times weekly    Discontinued Meds:   Medications Discontinued During This Encounter  Medication Reason  . 0.9 %  sodium chloride infusion   . insulin glargine (LANTUS) injection 42-80 Units   . 0.9 %  sodium chloride infusion     Social History:  reports that she has never smoked. She has never used smokeless tobacco. She reports that she does not drink alcohol or use illicit drugs.  Family History:   Family History  Problem Relation Age of Onset  . Coronary artery disease Father     Pertinent items are noted in HPI. Weight change:  No intake or output data in the 24 hours ending 09/10/12 1332  General appearance: alert, cooperative and no distress Head: Normocephalic, without obvious abnormality, atraumatic Eyes: negative Neck: no adenopathy, no carotid bruit, no JVD, supple, symmetrical, trachea midline and thyroid not enlarged, symmetric, no tenderness/mass/nodules Resp: clear to auscultation bilaterally Cardio: regular rate and rhythm and no rub GI: +BS, obese, NT Extremities: edema minimal pretib and clear based ulcer  at bottom of left great toe Neurologic: Grossly normal  Labs: Basic Metabolic Panel:  Recent Labs Lab 09/10/12 0134 09/10/12 0448 09/10/12 1204  NA 133* 133* 131*  K 7.0* 7.0* 7.4*  CL 103 104 104  CO2 16* 16* 15*  GLUCOSE 122* 173* 156*  BUN 85* 83* 80*  CREATININE 5.24* 5.14* 5.00*  ALBUMIN 3.4*  --   --   CALCIUM 9.6 9.6 9.8   Liver Function Tests:  Recent Labs Lab 09/10/12 0134  AST 20  ALT 24  ALKPHOS 72  BILITOT 0.1*  PROT 7.7  ALBUMIN 3.4*   No results found for this basename: LIPASE, AMYLASE,  in the last 168 hours No results found for this basename: AMMONIA,  in the last 168 hours CBC:  Recent Labs Lab 09/10/12 0134 09/10/12 0448  WBC 10.0 10.4  NEUTROABS 6.2  --   HGB 9.7* 9.2*  HCT 29.4* 27.9*  MCV 87.2 87.2  PLT 180 154   PT/INR: @labrcntip (inr:5) Cardiac Enzymes: No results found for this basename: CKTOTAL, CKMB, CKMBINDEX, TROPONINI,  in the last 168 hours CBG:  Recent Labs Lab 09/10/12 0817  GLUCAP 144*    Iron Studies: No results found for this basename: IRON, TIBC, TRANSFERRIN, FERRITIN,  in the last 168 hours  Xrays/Other Studies: US Renal  09/10/2012  *RADIOLOGY REPORT*  Clinical Data: Acute renal failure.  RENAL/URINARY  TRACT ULTRASOUND COMPLETE  Comparison:  CT urogram 11/05/2011  Findings:  Right Kidney:  The right kidney measures 12.4 cm length.  Normal parenchymal echotexture and thickness.  No focal cystic or solid lesions demonstrated.  No hydronephrosis.  Left Kidney:  The left kidney measures 11.6 cm length.  Normal parenchymal echotexture and thickness.  No focal cystic or solid lesions demonstrated.  No hydronephrosis.  Bladder:  The bladder is incompletely distended but the wall does not appear to be thickened.  Incidental note of a simple appearing cyst in the spleen measuring 3.8 x 3.6 x 3.5 cm diameter.  This corresponds to the lesions seen on the prior CT scan.  IMPRESSION: Normal ultrasound appearance of the kidneys.   No evidence of obstruction.  Incidental note of a simple appearing cyst in the spleen, likely benign.   Original Report Authenticated By: Burman Nieves, M.D.    Dg Chest Portable 1 View  09/10/2012  *RADIOLOGY REPORT*  Clinical Data: Abnormal labs.  Apnea.  PORTABLE CHEST - 1 VIEW  Comparison: 03/24/2012  Findings: Cardiac enlargement with mild to pulmonary vascular congestion.  No edema or consolidation.  No blunting of costophrenic angles.  No pneumothorax.  Mediastinal contours appear intact.  Tortuous aorta.  No significant change since previous study.  IMPRESSION: Cardiac enlargement with mild pulmonary vascular prominence.  No edema or consolidation.   Original Report Authenticated By: Burman Nieves, M.D.      Assessment/Plan: 1.  Hyperkalemia- multifactorial:  Related to K sparing diuretic/bactrim/AKI.  Agree with medical tx however I did discuss with the family that we may need a short session of HD if it does not respond.  She reports urine production.  Will dose with IV lasix 80mg  x1 now.  Repeat K and if still above 7 will plan for urgent HD. 2. AKI/CKD- as above.  Likely due to volume depletion in setting of bactrim/aldactone/diarrhea.  Plan as above.   3. Metabolic acidosis- due to #2.  Agree with bicarb drip. 4. CHF- stable 5. E Coli UTI- hold Bactrim and follow 6. OSA- CPAP qhs 7. Obesity 8. Diabetic foot ulcer- cont with wound care 9. Normocytic anemia- ? Related to chronic disease.  Check iron stores and guaiac stools. 10. Diarrhea- predated kayexalate and was on 3 weeks of abx.  May need to r/o cdiff if it persists/worsens 11. DM- per primary svc   [name] A 09/10/2012, 1:32 PM

## 2012-09-10 NOTE — ED Notes (Signed)
Pt returned from radiology.

## 2012-09-10 NOTE — Progress Notes (Addendum)
[facility] Progress Note [place] TEAM 1 - Stepdown/ICU TEAM   [place] H Dahan ION:[id number] DOB: 11-03-1945 DOA: 09/10/2012 PCP: Aida Puffer, MD  Brief narrative: 67 y.o. female with a diabetic left foot ulcer and h/o CHF presenting from home after blood work at her physicians office taken yesterday. Physicians office called patient at 11pm and told her to come emergently to the ED for an elevated potassium. The patient was asymptomatic with no CP or SOB, no h/o the same in the past, no known kidney problems. The patient was on multiple diuretics (including spironolactone), and recently was started on bactrim for a UTI x4 weeks. No f/c or recent illness otherwise, no changes in L foot ulcer.  In the ED her potassium was 7.0 and her creatinine was 5.24. Hospitalist has been asked to admit.   Assessment/Plan:  Refractory hyperkalemia Did not respond to initial attempt to tx - I have given another round of D50/insulin + Ca gluconate + 60g of Kayexelate - I have consulted Nephrology as I suspect she will require emergent HD (peaked T waves noted on tele) - discussed risk of sudden cardiac death w/ pt and family in great detail  Acute kidney failure  Metabolic acidosis   DM  Hx of CHF  HTN  Morbid obesity  Pulm HTN  OHS/SA  Code Status: FULL Family Communication: spoke w/ pt and multiple family members at bedside Disposition Plan: SDU  Consultants: Nephrology  Procedures:   Antibiotics: none  DVT prophylaxis: SCDs  HPI/Subjective: Pt seen for f/u interview.  Objective: Blood pressure 122/55, pulse 57, temperature 97.9 F (36.6 C), temperature source Oral, resp. rate 21, height 5\' 3"  (1.6 m), weight 123.6 kg (272 lb 7.8 oz), SpO2 96.00%. No intake or output data in the 24 hours ending 09/10/12 1306  Exam: F/U exam completed  Data Reviewed: Basic Metabolic Panel:  Recent Labs Lab 09/10/12 0134 09/10/12 0448 09/10/12 1204  NA 133* 133* 131*  K  7.0* 7.0* 7.4*  CL 103 104 104  CO2 16* 16* 15*  GLUCOSE 122* 173* 156*  BUN 85* 83* 80*  CREATININE 5.24* 5.14* 5.00*  CALCIUM 9.6 9.6 9.8   Liver Function Tests:  Recent Labs Lab 09/10/12 0134  AST 20  ALT 24  ALKPHOS 72  BILITOT 0.1*  PROT 7.7  ALBUMIN 3.4*   CBC:  Recent Labs Lab 09/10/12 0134 09/10/12 0448  WBC 10.0 10.4  NEUTROABS 6.2  --   HGB 9.7* 9.2*  HCT 29.4* 27.9*  MCV 87.2 87.2  PLT 180 154   BNP (last 3 results)  Recent Labs  04/21/12 1200 04/28/12 0615  PROBNP 1055.0* 331.5*   CBG:  Recent Labs Lab 09/10/12 0817  GLUCAP 144*    Recent Results (from the past 240 hour(s))  MRSA PCR SCREENING     Status: None   Collection Time    09/10/12  6:45 AM      Result Value Range Status   MRSA by PCR NEGATIVE  NEGATIVE Final   Comment:            The GeneXpert MRSA Assay (FDA     approved for NASAL specimens     only), is one component of a     comprehensive MRSA colonization     surveillance program. It is not     intended to diagnose MRSA     infection nor to guide or     monitor treatment for     MRSA infections.  Studies:  Recent x-ray studies have been reviewed in detail by the Attending Physician  Scheduled Meds:  Reviewed in detail by the Attending Physician   [facility] T  [facility] Office  [phone] Pager - Text Page per Amion as per below:  On-Call/Text Page:      [email or web address]      password [email or web address]  If 7PM-7AM, please contact night-coverage [email or web address] Password [email or web address] 09/10/2012, 1:06 PM   LOS: 0 days

## 2012-09-10 NOTE — ED Provider Notes (Signed)
History     CSN: [id number]  Arrival date & time 09/10/12  0056   First MD Initiated Contact with Patient 09/10/12 [id number]      Chief Complaint  Patient presents with  . Abnormal Lab    (Consider location/radiation/quality/duration/timing/severity/associated sxs/prior treatment) HPI Hx per PT. Diabetic with left foot ulcer and h/o CHF presenting from home - had blood work yesterday and her physician office called her at 11pm and told her to come to the ED for elevated potassium - no CP or SOB. No h/o same, no known kidney problems.  Is on BActrim for UTI x 4 weeks now. HAs also started metaolazone recently.  No F/C or recent illness otherwise, no changes in L foot ulcer. H/o hypokalemia. Only symptoms are inc fatigue and generalized weakness, MOD in severity  Past Medical History  Diagnosis Date  . OSA (obstructive sleep apnea)   . Thrombophlebitis of deep vein of leg   . DM (diabetes mellitus)   . CHF (congestive heart failure)     hx of EF 15-20% in 4/08,  echo 3/09 EF 60-65%;   echo 6/12: EF 55-60%, mild LAE  . Carotid stenosis     u/s 6/10: R 0-39%Ll 40-59%;  u/s 7/12: 40-59% bilateral (repeat in 7/13)  . HTN (hypertension)   . Morbid obesity   . Pulmonary HTN     mild,  cath 6/08 with PVR 5.1  . Hypokalemia   . 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 Spinal stenosis   . Obesity hypoventilation syndrome   . Cancer     hx of colon CA 2000  . Arthritis     Past Surgical History  Procedure Laterality Date  . Carpal tunnel release  07/2007  . Colon cancer with resection  2000  . Cholecystectomy  2002  . Vaginal hysterectomy  1983    Family History  Problem Relation Age of Onset  . Coronary artery disease Father     History  Substance Use Topics  . Smoking status: Never Smoker   . Smokeless tobacco: Never Used  . Alcohol Use: No    OB History   Grav Para Term Preterm Abortions TAB SAB Ect Mult Living                  Review of  Systems  Constitutional: Negative for fever and chills.  HENT: Negative for neck pain and neck stiffness.   Eyes: Negative for pain.  Respiratory: Negative for shortness of breath.   Cardiovascular: Negative for chest pain.  Gastrointestinal: Negative for abdominal pain.  Genitourinary: Negative for dysuria.  Musculoskeletal: Negative for back pain.  Skin: Negative for rash.  Neurological: Negative for headaches.  All other systems reviewed and are negative.    Allergies  Codeine; Meperidine hcl; and Metformin  Home Medications   Current Outpatient Rx  Name  Route  Sig  Dispense  Refill  . aspirin (BAYER ASPIRIN) 325 MG tablet   Oral   Take 325 mg by mouth daily.           . carvedilol (COREG) 3.125 MG tablet   Oral   Take 3.125 mg by mouth 2 (two) times daily with a meal.          . cyanocobalamin (,VITAMIN B-12,) 1000 MCG/ML injection   Intramuscular   Inject 1,000 mcg into the muscle every 30 (thirty) days. Pt states last dose at end of August         .  insulin glargine (LANTUS) 100 UNIT/ML injection   Subcutaneous   Inject 42-80 Units into the skin 2 (two) times daily. Pt uses 42 units in AM and 80 units in PM         . insulin glulisine (APIDRA) 100 UNIT/ML injection   Subcutaneous   Inject 10 Units into the skin 3 (three) times daily before meals.          Marland Kitchen LORazepam (ATIVAN) 1 MG tablet   Oral   Take 1 mg by mouth 2 (two) times daily.           . metolazone (ZAROXOLYN) 2.5 MG tablet      2.5 mg once a week and as needed for bloating   10 tablet   3   . sertraline (ZOLOFT) 100 MG tablet   Oral   Take 50 mg by mouth daily. Take 1/2 tab by mouth once daily         . simvastatin (ZOCOR) 40 MG tablet   Oral   Take 1 tablet (40 mg total) by mouth at bedtime.   90 tablet   3   . spironolactone (ALDACTONE) 25 MG tablet   Oral   Take 1 tablet (25 mg total) by mouth 2 (two) times daily.   60 tablet   6   . sulfamethoxazole-trimethoprim  (BACTRIM DS) 800-160 MG per tablet   Oral   Take 1 tablet by mouth 2 (two) times daily.         Marland Kitchen torsemide (DEMADEX) 20 MG tablet   Oral   Take 2 tablets (40 mg total) by mouth daily.   60 tablet   3   . Vitamin D, Ergocalciferol, (DRISDOL) 50000 UNITS CAPS   Oral   Take 50,000 Units by mouth 2 (two) times a week. Take on Mon and Thurs.           BP 163/62  Pulse 63  Temp(Src) 97.4 F (36.3 C) (Oral)  Resp 14  SpO2 96%  Physical Exam  Constitutional: She is oriented to person, place, and time. She appears well-developed and well-nourished.  HENT:  Head: Normocephalic and atraumatic.  Mouth/Throat: Oropharynx is clear and moist. No oropharyngeal exudate.  Eyes: Conjunctivae and EOM are normal. Pupils are equal, round, and reactive to light. No scleral icterus.  Neck: Normal range of motion. Neck supple.  Cardiovascular: Normal rate, regular rhythm and intact distal pulses.   Pulmonary/Chest: Effort normal and breath sounds normal. No respiratory distress. She has no rales.  Abdominal: Soft. Bowel sounds are normal. She exhibits no distension. There is no tenderness.  Musculoskeletal: Normal range of motion. She exhibits no edema.  Neurological: She is alert and oriented to person, place, and time.  Skin: Skin is warm and dry.    ED Course  Procedures (including critical care time)   Date: 09/10/2012  Rate: 63  Rhythm: normal sinus rhythm  QRS Axis: left  Intervals: normal  ST/T Wave abnormalities: nonspecific ST changes  Conduction Disutrbances:none  Narrative Interpretation:   Old EKG Reviewed: changes noted  Results for orders placed during the hospital encounter of 09/10/12  CBC WITH DIFFERENTIAL      Result Value Range   WBC 10.0  4.0 - 10.5 K/uL   RBC 3.37 (*) 3.87 - 5.11 MIL/uL   Hemoglobin 9.7 (*) 12.0 - 15.0 g/dL   HCT 52.8 (*) 41.3 - 24.4 %   MCV 87.2  78.0 - 100.0 fL   MCH 28.8  26.0 - 34.0  pg   MCHC 33.0  30.0 - 36.0 g/dL   RDW 09.8  11.9 -  14.7 %   Platelets 180  150 - 400 K/uL   Neutrophils Relative 62  43 - 77 %   Neutro Abs 6.2  1.7 - 7.7 K/uL   Lymphocytes Relative 21  12 - 46 %   Lymphs Abs 2.1  0.7 - 4.0 K/uL   Monocytes Relative 11  3 - 12 %   Monocytes Absolute 1.1 (*) 0.1 - 1.0 K/uL   Eosinophils Relative 5  0 - 5 %   Eosinophils Absolute 0.5  0.0 - 0.7 K/uL   Basophils Relative 0  0 - 1 %   Basophils Absolute 0.0  0.0 - 0.1 K/uL  COMPREHENSIVE METABOLIC PANEL      Result Value Range   Sodium 133 (*) 135 - 145 mEq/L   Potassium 7.0 (*) 3.5 - 5.1 mEq/L   Chloride 103  96 - 112 mEq/L   CO2 16 (*) 19 - 32 mEq/L   Glucose, Bld 122 (*) 70 - 99 mg/dL   BUN 85 (*) 6 - 23 mg/dL   Creatinine, Ser 8.29 (*) 0.50 - 1.10 mg/dL   Calcium 9.6  8.4 - 56.2 mg/dL   Total Protein 7.7  6.0 - 8.3 g/dL   Albumin 3.4 (*) 3.5 - 5.2 g/dL   AST 20  0 - 37 U/L   ALT 24  0 - 35 U/L   Alkaline Phosphatase 72  39 - 117 U/L   Total Bilirubin 0.1 (*) 0.3 - 1.2 mg/dL   GFR calc non Af Amer 8 (*) >90 mL/min   GFR calc Af Amer 9 (*) >90 mL/min   CRITICAL CARE Performed by: Sunnie Nielsen   Total critical care time: 30  Critical care time was exclusive of separately billable procedures and treating other patients.  Critical care was necessary to treat or prevent imminent or life-threatening deterioration.  Critical care was time spent personally by me on the following activities: development of treatment plan with patient and/or surrogate as well as nursing, discussions with consultants, evaluation of patient's response to treatment, examination of patient, obtaining history from patient or surrogate, ordering and performing treatments and interventions, ordering and review of laboratory studies, ordering and review of radiographic studies, pulse oximetry and re-evaluation of patient's condition. IV insulin. IV dextrose. IV calcium. Kayexalate. ECG changes. IVFs. MED c/s and admit   Date: 09/10/2012  Rate: 63  Rhythm: normal sinus  rhythm  QRS Axis: left  Intervals: normal  ST/T Wave abnormalities: nonspecific ST changes  Conduction Disutrbances:nonspecific intraventricular conduction delay  Narrative Interpretation:   Old EKG Reviewed: changes noted new peaked T waves  Severe hyperkalemia  Dr. Julian Reil to admit  MDM   Elevated potassium with new acute renal failure, treated with IV medications as above, cardiac monitoring, IV fluids  Serial evaluations   medical admission       Sunnie Nielsen, MD 09/11/12 [phone]

## 2012-09-11 DIAGNOSIS — I499 Cardiac arrhythmia, unspecified: Secondary | ICD-10-CM

## 2012-09-11 DIAGNOSIS — I5032 Chronic diastolic (congestive) heart failure: Secondary | ICD-10-CM

## 2012-09-11 LAB — RENAL FUNCTION PANEL
Albumin: 3.1 g/dL — ABNORMAL LOW (ref 3.5–5.2)
BUN: 75 mg/dL — ABNORMAL HIGH (ref 6–23)
Calcium: 9.6 mg/dL (ref 8.4–10.5)
GFR calc Af Amer: 10 mL/min — ABNORMAL LOW (ref >90)
Phosphorus: 6.2 mg/dL — ABNORMAL HIGH (ref 2.3–4.6)
Potassium: 5.3 mEq/L — ABNORMAL HIGH (ref 3.5–5.1)
Sodium: 139 mEq/L (ref 135–145)

## 2012-09-11 LAB — URINALYSIS, ROUTINE W REFLEX MICROSCOPIC
Glucose, UA: NEGATIVE mg/dL
Leukocytes, UA: NEGATIVE
Nitrite: NEGATIVE
pH: 5 (ref 5.0–8.0)

## 2012-09-11 LAB — GLUCOSE, CAPILLARY
Glucose-Capillary: 151 mg/dL — ABNORMAL HIGH (ref 70–99)
Glucose-Capillary: 192 mg/dL — ABNORMAL HIGH (ref 70–99)

## 2012-09-11 LAB — IRON AND TIBC
Iron: 56 ug/dL (ref 42–135)
Saturation Ratios: 20 % (ref 20–55)
TIBC: 283 ug/dL (ref 250–470)

## 2012-09-11 LAB — CBC
HCT: 28.9 % — ABNORMAL LOW (ref 36.0–46.0)
MCH: 28.5 pg (ref 26.0–34.0)
MCV: 86.8 fL (ref 78.0–100.0)
Platelets: 189 10*3/uL (ref 150–400)
RBC: 3.33 MIL/uL — ABNORMAL LOW (ref 3.87–5.11)
WBC: 9.1 10*3/uL (ref 4.0–10.5)

## 2012-09-11 LAB — HEPATITIS B CORE ANTIBODY, IGM: Hep B C IgM: NEGATIVE

## 2012-09-11 LAB — SODIUM, URINE, RANDOM: Sodium, Ur: 102 mEq/L

## 2012-09-11 MED ORDER — CYANOCOBALAMIN 1000 MCG/ML IJ SOLN: 1000.0000 ug | INTRAMUSCULAR | Status: DC

## 2012-09-11 NOTE — Progress Notes (Signed)
Utilization Review Completed.Bharat Antillon T2/23/2014  

## 2012-09-11 NOTE — Progress Notes (Signed)
[facility] Progress Note [place] TEAM 1 - Stepdown/ICU TEAM   [place] H [place] ZOX:[id number] DOB: 1946/04/08 DOA: 09/10/2012 PCP: Aida Puffer, MD  Brief narrative: 67 y.o. female with a diabetic left foot ulcer and h/o CHF presenting from home after blood work at her physicians office. Physicians office called patient at 11pm and told her to go emergently to the ED for an elevated potassium. The patient was asymptomatic with no CP or SOB, no h/o the same in the past, no known kidney problems. The patient was on multiple diuretics (including spironolactone), and recently was started on bactrim for a UTI x4 weeks. No f/c or recent illness otherwise, no changes in L foot ulcer.  In the ED her potassium was 7.0 and her creatinine was 5.24.   Assessment/Plan:  Refractory hyperkalemia Did not respond to initial attempt to tx - fortunately did respond to high-dose Kayexalate therapy - is felt to be due to diuretic therapy plus acute kidney failure - nephrology following as well  Acute kidney failure Felt to be due to medications - slowly begin to improve - avoid further nephron insults  Metabolic acidosis  Due to above - slow bicarbonate infusion rate  DM Reasonably controlled at the present time  Hx of CHF Ejection fraction 50% via echocardiogram September 2013 with mild LVH and evidence of mild diastolic dysfunction - no evidence of volume overload at present time  HTN Reasonably controlled at this time  Morbid obesity  Pulm HTN  OHS/SA Continue nightly CPAP regimen  Code Status: FULL Family Communication: No family present today Disposition Plan: Stable for transfer to telemetry bed  Consultants: Nephrology  Procedures: None  Antibiotics: none  DVT prophylaxis: SCDs  HPI/Subjective: Patient is alert and interactive.  She reports multiple bowel movements after the 60 g dose of Kayexalate.  She denies chest pain fevers chills nausea vomiting or  palpitations.  Objective: Blood pressure 134/82, pulse 72, temperature 97.8 F (36.6 C), temperature source Oral, resp. rate 16, height 5\' 3"  (1.6 m), weight 123.6 kg (272 lb 7.8 oz), SpO2 95.00%.  Intake/Output Summary (Last 24 hours) at 09/11/12 1738 Last data filed at 09/11/12 0600  Gross per 24 hour  Intake   1750 ml  Output   1655 ml  Net     95 ml   Exam: General: No acute respiratory distress Lungs: Clear to auscultation bilaterally without wheezes or crackles Cardiovascular: Regular rate and rhythm without murmur gallop or rub normal S1 and S2 Abdomen: Nontender, nondistended, soft, bowel sounds positive, no rebound, no ascites, no appreciable mass Extremities: No significant cyanosis, clubbing, or edema bilateral lower extremities  Data Reviewed: Basic Metabolic Panel:  Recent Labs Lab 09/10/12 0134 09/10/12 0448 09/10/12 1204 09/10/12 1436 09/11/12 0600  NA 133* 133* 131* 135 139  K 7.0* 7.0* 7.4* 6.0* 5.3*  CL 103 104 104 105 105  CO2 16* 16* 15* 16* 23  GLUCOSE 122* 173* 156* 224* 118*  BUN 85* 83* 80* 79* 75*  CREATININE 5.24* 5.14* 5.00* 4.98* 4.62*  CALCIUM 9.6 9.6 9.8 9.8 9.6  PHOS  --   --   --  5.9* 6.2*   Liver Function Tests:  Recent Labs Lab 09/10/12 0134 09/10/12 1436 09/11/12 0600  AST 20  --   --   ALT 24  --   --   ALKPHOS 72  --   --   BILITOT 0.1*  --   --   PROT 7.7  --   --  ALBUMIN 3.4* 3.3* 3.1*   CBC:  Recent Labs Lab 09/10/12 0134 09/10/12 0448 09/11/12 0600  WBC 10.0 10.4 9.1  NEUTROABS 6.2  --   --   HGB 9.7* 9.2* 9.5*  HCT 29.4* 27.9* 28.9*  MCV 87.2 87.2 86.8  PLT 180 154 189   BNP (last 3 results)  Recent Labs  04/21/12 1200 04/28/12 0615  PROBNP 1055.0* 331.5*   CBG:  Recent Labs Lab 09/10/12 1705 09/10/12 2132 09/11/12 0725 09/11/12 1137 09/11/12 1650  GLUCAP 155* 101* 120* 170* 151*    Recent Results (from the past 240 hour(s))  MRSA PCR SCREENING     Status: None   Collection Time     09/10/12  6:45 AM      Result Value Range Status   MRSA by PCR NEGATIVE  NEGATIVE Final   Comment:            The GeneXpert MRSA Assay (FDA     approved for NASAL specimens     only), is one component of a     comprehensive MRSA colonization     surveillance program. It is not     intended to diagnose MRSA     infection nor to guide or     monitor treatment for     MRSA infections.     Studies:  Recent x-ray studies have been reviewed in detail by the Attending Physician  Scheduled Meds:  Reviewed in detail by the Attending Physician   [facility] T  [facility] Office  [phone] Pager - Text Page per Amion as per below:  On-Call/Text Page:      [email or web address]      password [email or web address]  If 7PM-7AM, please contact night-coverage [email or web address] Password [facility] 09/11/2012, 5:38 PM   LOS: 1 day

## 2012-09-11 NOTE — Progress Notes (Signed)
Patient ID: Richard Miu Guimond, female   DOB: 07/07/1946, 67 y.o.   MRN: [id number] S:Feels better. O:BP 94/32  Pulse 64  Temp(Src) 97.9 F (36.6 C) (Oral)  Resp 17  Ht 5\' 3"  (1.6 m)  Wt 123.6 kg (272 lb 7.8 oz)  BMI 48.28 kg/m2  SpO2 95%  Intake/Output Summary (Last 24 hours) at 09/11/12 1132 Last data filed at 09/11/12 0600  Gross per 24 hour  Intake   1750 ml  Output   2055 ml  Net   -305 ml   Intake/Output: I/O last 3 completed shifts: In: 1750 [I.V.:1750] Out: 2055 [Urine:2050; Stool:5]  Intake/Output this shift:    Weight change:  Gen:WD obese WF in NAD CVS:no rub Resp:occ rhonchi WGN:FAOZHYQMV/HQION, NT Ext:no edema   Recent Labs Lab 09/10/12 0134 09/10/12 0448 09/10/12 1204 09/10/12 1436 09/11/12 0600  NA 133* 133* 131* 135 139  K 7.0* 7.0* 7.4* 6.0* 5.3*  CL 103 104 104 105 105  CO2 16* 16* 15* 16* 23  GLUCOSE 122* 173* 156* 224* 118*  BUN 85* 83* 80* 79* 75*  CREATININE 5.24* 5.14* 5.00* 4.98* 4.62*  ALBUMIN 3.4*  --   --  3.3* 3.1*  CALCIUM 9.6 9.6 9.8 9.8 9.6  PHOS  --   --   --  5.9* 6.2*  AST 20  --   --   --   --   ALT 24  --   --   --   --    Liver Function Tests:  Recent Labs Lab 09/10/12 0134 09/10/12 1436 09/11/12 0600  AST 20  --   --   ALT 24  --   --   ALKPHOS 72  --   --   BILITOT 0.1*  --   --   PROT 7.7  --   --   ALBUMIN 3.4* 3.3* 3.1*   No results found for this basename: LIPASE, AMYLASE,  in the last 168 hours No results found for this basename: AMMONIA,  in the last 168 hours CBC:  Recent Labs Lab 09/10/12 0134 09/10/12 0448 09/11/12 0600  WBC 10.0 10.4 9.1  NEUTROABS 6.2  --   --   HGB 9.7* 9.2* 9.5*  HCT 29.4* 27.9* 28.9*  MCV 87.2 87.2 86.8  PLT 180 154 189   Cardiac Enzymes: No results found for this basename: CKTOTAL, CKMB, CKMBINDEX, TROPONINI,  in the last 168 hours CBG:  Recent Labs Lab 09/10/12 0817 09/10/12 1705 09/10/12 2132 09/11/12 0725  GLUCAP 144* 155* 101* 120*    Iron Studies:   Recent Labs  09/10/12 1435  IRON 56  TIBC 283  FERRITIN 349*   Studies/Results: US Renal  09/10/2012  *RADIOLOGY REPORT*  Clinical Data: Acute renal failure.  RENAL/URINARY TRACT ULTRASOUND COMPLETE  Comparison:  CT urogram 11/05/2011  Findings:  Right Kidney:  The right kidney measures 12.4 cm length.  Normal parenchymal echotexture and thickness.  No focal cystic or solid lesions demonstrated.  No hydronephrosis.  Left Kidney:  The left kidney measures 11.6 cm length.  Normal parenchymal echotexture and thickness.  No focal cystic or solid lesions demonstrated.  No hydronephrosis.  Bladder:  The bladder is incompletely distended but the wall does not appear to be thickened.  Incidental note of a simple appearing cyst in the spleen measuring 3.8 x 3.6 x 3.5 cm diameter.  This corresponds to the lesions seen on the prior CT scan.  IMPRESSION: Normal ultrasound appearance of the kidneys.  No evidence of obstruction.  Incidental note of a simple appearing cyst in the spleen, likely benign.   Original Report Authenticated By: Burman Nieves, M.D.    Dg Chest Portable 1 View  09/10/2012  *RADIOLOGY REPORT*  Clinical Data: Abnormal labs.  Apnea.  PORTABLE CHEST - 1 VIEW  Comparison: 03/24/2012  Findings: Cardiac enlargement with mild to pulmonary vascular congestion.  No edema or consolidation.  No blunting of costophrenic angles.  No pneumothorax.  Mediastinal contours appear intact.  Tortuous aorta.  No significant change since previous study.  IMPRESSION: Cardiac enlargement with mild pulmonary vascular prominence.  No edema or consolidation.   Original Report Authenticated By: Burman Nieves, M.D.    . aspirin  325 mg Oral Daily  . carvedilol  3.125 mg Oral BID WC  . cyanocobalamin  1,000 mcg Intramuscular Q30 days  . insulin aspart  10 Units Subcutaneous TID WC  . insulin glargine  42 Units Subcutaneous Daily   And  . insulin glargine  80 Units Subcutaneous QHS  . LORazepam  1 mg Oral BID   . sertraline  50 mg Oral Daily  . simvastatin  40 mg Oral QHS  . sodium chloride  3 mL Intravenous Q12H  . [START ON 09/12/2012] Vitamin D (Ergocalciferol)  50,000 Units Oral 2 times weekly    BMET    Component Value Date/Time   NA 139 09/11/2012 0600   K 5.3* 09/11/2012 0600   CL 105 09/11/2012 0600   CO2 23 09/11/2012 0600   GLUCOSE 118* 09/11/2012 0600   BUN 75* 09/11/2012 0600   CREATININE 4.62* 09/11/2012 0600   CREATININE 1.19* 04/22/2011 1053   CALCIUM 9.6 09/11/2012 0600   GFRNONAA 9* 09/11/2012 0600   GFRAA 10* 09/11/2012 0600   CBC    Component Value Date/Time   WBC 9.1 09/11/2012 0600   RBC 3.33* 09/11/2012 0600   HGB 9.5* 09/11/2012 0600   HCT 28.9* 09/11/2012 0600   PLT 189 09/11/2012 0600   MCV 86.8 09/11/2012 0600   MCH 28.5 09/11/2012 0600   MCHC 32.9 09/11/2012 0600   RDW 15.0 09/11/2012 0600   LYMPHSABS 2.1 09/10/2012 0134   MONOABS 1.1* 09/10/2012 0134   EOSABS 0.5 09/10/2012 0134   BASOSABS 0.0 09/10/2012 0134     Assessment/Plan:  1. Hyperkalemia- multifactorial: Related to K sparing diuretic/bactrim/AKI(likely related to diarrhea/bactrim therapy). She has responded to medical tx.  Cont to hold aldactone/bactrim.  Ok to resume torsemide and cont to follow UOP, Scr, and K levels.    2. AKI/CKD- as above. Likely due to volume depletion in setting of bactrim/aldactone/diarrhea. UA negative for blood/protein/leukocytes.  Most likely hemodynamically medicated.  No obstruction on [place].  Plan as above. Cont to follow UOP and daily Scr/K.  Cont to hold aldactone/bactrim.  3. Metabolic acidosis- due to #2. resolved with bicarb drip.  Would decrease rate given h/o CHF. 4. CHF- stable 5. E Coli UTI- hold Bactrim and follow.  Currently asymptomatic with clean UA. 6. OSA- CPAP qhs 7. Obesity 8. Diabetic foot ulcer- cont with wound care 9. Normocytic anemia- ? Related to chronic disease. Guaiac stools.  Low TSAT, consider IV Feraheme during this hospitalization.  Await stool  cards. 10. Diarrhea- predated kayexalate and was on 3 weeks of abx. May need to r/o cdiff if it persists/worsens 11. DM- per primary svc 12. Dispo- per primary svc  Morgane Joerger A

## 2012-09-12 DIAGNOSIS — I509 Heart failure, unspecified: Secondary | ICD-10-CM

## 2012-09-12 LAB — RENAL FUNCTION PANEL
CO2: 25 mEq/L (ref 19–32)
Calcium: 9.8 mg/dL (ref 8.4–10.5)
Chloride: 102 mEq/L (ref 96–112)
GFR calc Af Amer: 14 mL/min — ABNORMAL LOW (ref >90)
GFR calc non Af Amer: 12 mL/min — ABNORMAL LOW (ref >90)
Glucose, Bld: 113 mg/dL — ABNORMAL HIGH (ref 70–99)
Sodium: 140 mEq/L (ref 135–145)

## 2012-09-12 LAB — GLUCOSE, CAPILLARY
Glucose-Capillary: 119 mg/dL — ABNORMAL HIGH (ref 70–99)
Glucose-Capillary: 205 mg/dL — ABNORMAL HIGH (ref 70–99)

## 2012-09-12 LAB — CBC
HCT: 30.6 % — ABNORMAL LOW (ref 36.0–46.0)
Hemoglobin: 10 g/dL — ABNORMAL LOW (ref 12.0–15.0)
RBC: 3.51 MIL/uL — ABNORMAL LOW (ref 3.87–5.11)
WBC: 11.3 10*3/uL — ABNORMAL HIGH (ref 4.0–10.5)

## 2012-09-12 LAB — HEPATITIS B SURFACE ANTIBODY,QUALITATIVE: Hep B S Ab: NONREACTIVE

## 2012-09-12 MED ORDER — FERUMOXYTOL INJECTION 510 MG/17 ML
510.0000 mg | Freq: Once | INTRAVENOUS | Status: AC
  Administered 2012-09-12: 510 mg via INTRAVENOUS
  Filled 2012-09-12: qty 17

## 2012-09-12 MED ORDER — ACETAMINOPHEN 325 MG PO TABS
650.0000 mg | ORAL_TABLET | ORAL | Status: DC | PRN
  Administered 2012-09-12: 650 mg via ORAL
  Filled 2012-09-12: qty 2

## 2012-09-12 MED ORDER — TORSEMIDE 20 MG PO TABS
40.0000 mg | ORAL_TABLET | Freq: Every day | ORAL | Status: DC
  Administered 2012-09-12: 40 mg via ORAL
  Filled 2012-09-12: qty 2

## 2012-09-12 NOTE — Care Management Note (Signed)
    Page 1 of 1   09/12/2012     12:24:51 PM   CARE MANAGEMENT NOTE 09/12/2012  Patient:  [name], [name]   Account Number:  [id number]  Date Initiated:  09/12/2012  Documentation initiated by:  GRAVES-BIGELOW,Molley Houser  Subjective/Objective Assessment:   Pt was admitted for hyperkalemia. Plan for d/c home today with [facility] services. Pt was previously active with [facility].     Action/Plan:   Resumption of services- CM did make referral for services. SOC to begin within 24-48 hours post d/c.   Anticipated DC Date:  09/12/2012   Anticipated DC Plan:  HOME W HOME HEALTH SERVICES      DC Planning Services  CM consult      [facility] Choice  HOME HEALTH  Resumption Of Svcs/PTA Provider   Choice offered to / List presented to:  C-1 Patient        HH arranged  HH-10 DISEASE MANAGEMENT  HH-1 RN  HH-2 PT  HH-6 SOCIAL WORKER      HH agency  [facility]   Status of service:  Completed, signed off Medicare Important Message given?   (If response is "NO", the following Medicare IM given date fields will be blank) Date Medicare IM given:   Date Additional Medicare IM given:    Discharge Disposition:  HOME W HOME HEALTH SERVICES  Per UR Regulation:  Reviewed for med. necessity/level of care/duration of stay  If discussed at Long Length of Stay Meetings, dates discussed:    Comments:

## 2012-09-12 NOTE — Progress Notes (Signed)
Subjective: Interval History: has complaints doing better.  Objective: Vital signs in last 24 hours: Temp:  [97.6 F (36.4 C)-98.2 F (36.8 C)] 98.2 F (36.8 C) (02/24 0551) Pulse Rate:  [72-88] 88 (02/24 0930) Resp:  [16-18] 18 (02/24 0551) BP: (134-144)/(67-89) 134/73 mmHg (02/24 0930) SpO2:  [95 %-97 %] 97 % (02/24 0551) Weight:  [121 kg (266 lb 12.1 oz)] 121 kg (266 lb 12.1 oz) (02/24 0551) Weight change:   Intake/Output from previous day: 02/23 0701 - 02/24 0700 In: 240 [I.V.:240] Out: 300 [Urine:300] Intake/Output this shift: Total I/O In: 240 [P.O.:240] Out: -   General appearance: alert, cooperative, no distress and morbidly obese Resp: diminished breath sounds bilaterally Cardio: S1, S2 normal and systolic murmur: holosystolic 2/6, blowing at lower left sternal border GI: obese,liver down 6 cm, pos bs Extremities: edema 1+.multiple excoriations and 0  Lab Results:  Recent Labs  09/11/12 0600 09/12/12 0700  WBC 9.1 11.3*  HGB 9.5* 10.0*  HCT 28.9* 30.6*  PLT 189 197   BMET:  Recent Labs  09/11/12 0600 09/12/12 0700  NA 139 140  K 5.3* 4.5  CL 105 102  CO2 23 25  GLUCOSE 118* 113*  BUN 75* 62*  CREATININE 4.62* 3.67*  CALCIUM 9.6 9.8   No results found for this basename: PTH,  in the last 72 hours Iron Studies:  Recent Labs  09/10/12 1435  IRON 56  TIBC 283  FERRITIN 349*    Studies/Results: No results found.  I have reviewed the patient's current medications.  Assessment/Plan: 1 AKI/?CKD improving. Vol ok, restarted Torsemide, may need bid.  Avoid SULFA, Spironolactone, will need f/u labs in 2-3 days. 2 Anemia give iv Fe 3 Obesity 4 OSA 5 DM per primary 6 HTN cont current meds, diruesis 7 Leg ulcers per wound care. P will s/o for now and see again at your request.  Will need f/u labs in 2-3 d.    LOS: 2 days   [name] L 09/12/2012,10:35 AM

## 2012-09-13 DIAGNOSIS — G4733 Obstructive sleep apnea (adult) (pediatric): Secondary | ICD-10-CM | POA: Diagnosis not present

## 2012-09-13 DIAGNOSIS — I5032 Chronic diastolic (congestive) heart failure: Secondary | ICD-10-CM | POA: Diagnosis not present

## 2012-09-13 DIAGNOSIS — I509 Heart failure, unspecified: Secondary | ICD-10-CM | POA: Diagnosis not present

## 2012-09-13 DIAGNOSIS — I10 Essential (primary) hypertension: Secondary | ICD-10-CM | POA: Diagnosis not present

## 2012-09-13 DIAGNOSIS — M159 Polyosteoarthritis, unspecified: Secondary | ICD-10-CM | POA: Diagnosis not present

## 2012-09-13 DIAGNOSIS — E119 Type 2 diabetes mellitus without complications: Secondary | ICD-10-CM | POA: Diagnosis not present

## 2012-09-13 DIAGNOSIS — Z79899 Other long term (current) drug therapy: Secondary | ICD-10-CM | POA: Diagnosis not present

## 2012-09-14 ENCOUNTER — Telehealth (HOSPITAL_COMMUNITY): Payer: Self-pay | Admitting: *Deleted

## 2012-09-14 DIAGNOSIS — I509 Heart failure, unspecified: Secondary | ICD-10-CM | POA: Diagnosis not present

## 2012-09-14 DIAGNOSIS — I5032 Chronic diastolic (congestive) heart failure: Secondary | ICD-10-CM | POA: Diagnosis not present

## 2012-09-14 DIAGNOSIS — M159 Polyosteoarthritis, unspecified: Secondary | ICD-10-CM | POA: Diagnosis not present

## 2012-09-14 DIAGNOSIS — I10 Essential (primary) hypertension: Secondary | ICD-10-CM | POA: Diagnosis not present

## 2012-09-14 DIAGNOSIS — E119 Type 2 diabetes mellitus without complications: Secondary | ICD-10-CM | POA: Diagnosis not present

## 2012-09-14 DIAGNOSIS — G4733 Obstructive sleep apnea (adult) (pediatric): Secondary | ICD-10-CM | POA: Diagnosis not present

## 2012-09-14 NOTE — Telephone Encounter (Signed)
Received labs from [facility], bun 60 cr 3.60 K 4.7, pt states her wt is down to 267, she states she usually runs around 270ish, per Ulyess Blossom, PA hold torsemide for 3 days and recheck in 1 week, pt is aware and verbalizes understanding

## 2012-09-14 NOTE — Discharge Summary (Signed)
Physician Discharge Summary  [place] Call ZOX:[id number] DOB: Nov 27, 1945 DOA: 09/10/2012  PCP: Aida Puffer, MD  Admit date: 09/10/2012 Discharge date: 09/12/2012    Recommendations for Outpatient Follow-up:  Follow up nephrologist as recommended.  Discharge Diagnoses:  Active Problems:   DM   CARDIOMYOPATHY, PRIMARY, DILATED   Acute kidney failure   Hyperkalemia   Discharge Condition: stable.   Diet recommendation: low sodium diet  Filed Weights   09/10/12 0604 09/12/12 0551  Weight: 123.6 kg (272 lb 7.8 oz) 121 kg (266 lb 12.1 oz)    History of present illness:  67 y.o. female with a diabetic left foot ulcer and h/o CHF presenting from home after blood work at her physicians office. Physicians office called patient at 11pm and told her to go emergently to the ED for an elevated potassium. The patient was asymptomatic with no CP or SOB, no h/o the same in the past, no known kidney problems. The patient was on multiple diuretics (including spironolactone), and recently was started on bactrim for a UTI x4 weeks. No f/c or recent illness otherwise, no changes in L foot ulcer.  In the ED her potassium was 7.0 and her creatinine was 5.24.    Hospital Course:  Refractory hyperkalemia : resolved.  Did not respond to initial attempt to tx - fortunately did respond to high-dose Kayexalate therapy - is felt to be due to diuretic therapy plus acute kidney failure - her spironolactone was dicontinued and she was discharged on torsemide. Her potassium is normal ondischarge.  Acute kidney failure  Felt to be due to medications - slowly begin to improve - avoid further nephron insults . Recommend follow up labs in 2 to 3 days.  Metabolic acidosis : resolved. Due to above - slow bicarbonate infusion rate was started. Her bicarb level is normal and it was discontinued. DM  Reasonably controlled at the present time  Hx of CHF  Ejection fraction 50% via echocardiogram September 2013 with mild  LVH and evidence of mild diastolic dysfunction - no evidence of volume overload at present time  HTN  Reasonably controlled at this time  Morbid obesity  Pulm HTN  OHS/SA  Continue nightly CPAP regimen  Consultations:  nephrology  Discharge Exam: Filed Vitals:   09/11/12 2100 09/11/12 2250 09/12/12 0551 09/12/12 0930  BP: 141/89  144/67 134/73  Pulse: 72 78 85 88  Temp: 98.2 F (36.8 C)  98.2 F (36.8 C)   TempSrc: Oral  Oral   Resp: 18 18 18    Height:      Weight:   121 kg (266 lb 12.1 oz)   SpO2: 96%  97%    General: No acute respiratory distress  Lungs: Clear to auscultation bilaterally without wheezes or crackles  Cardiovascular: Regular rate and rhythm without murmur gallop or rub normal S1 and S2  Abdomen: Nontender, nondistended, soft, bowel sounds positive, no rebound, no ascites, no appreciable mass  Extremities: No significant cyanosis, clubbing, or edema bilateral lower extremities    Discharge Instructions  Discharge Orders   Future Appointments Provider Department Dept Phone   10/10/2012 1:30 PM [facility] [phone]   02/06/2013 11:00 AM Barbaraann Share, MD [facility] [phone]   Future Orders Complete By Expires     (HEART FAILURE PATIENTS) Call MD:  Anytime you have any of the following symptoms: 1) 3 pound weight gain in 24 hours or 5 pounds in 1 week 2) shortness of  breath, with or without a dry hacking cough 3) swelling in the hands, feet or stomach 4) if you have to sleep on extra pillows at night in order to breathe.  As directed     Call MD for:  persistant dizziness or light-headedness  As directed     Call MD for:  redness, tenderness, or signs of infection (pain, swelling, redness, odor or green/yellow discharge around incision site)  As directed     Diet - low sodium heart healthy  As directed     Discharge instructions  As directed     Comments:      Follow up with  nephrologist and cardiologist as recommended.        Medication List    STOP taking these medications       spironolactone 25 MG tablet  Commonly known as:  ALDACTONE     sulfamethoxazole-trimethoprim 800-160 MG per tablet  Commonly known as:  BACTRIM DS      TAKE these medications       APIDRA 100 UNIT/ML injection  Generic drug:  insulin glulisine  Inject 10 Units into the skin 3 (three) times daily before meals.     BAYER ASPIRIN 325 MG tablet  Generic drug:  aspirin  Take 325 mg by mouth daily.     carvedilol 3.125 MG tablet  Commonly known as:  COREG  Take 3.125 mg by mouth 2 (two) times daily with a meal.     cyanocobalamin 1000 MCG/ML injection  Commonly known as:  (VITAMIN B-12)  Inject 1,000 mcg into the muscle every 30 (thirty) days. Pt states last dose at end of August     insulin glargine 100 UNIT/ML injection  Commonly known as:  LANTUS  Inject 42-80 Units into the skin 2 (two) times daily. Pt uses 42 units in AM and 80 units in PM     LORazepam 1 MG tablet  Commonly known as:  ATIVAN  Take 1 mg by mouth 2 (two) times daily.     metolazone 2.5 MG tablet  Commonly known as:  ZAROXOLYN  2.5 mg once a week and as needed for bloating     sertraline 100 MG tablet  Commonly known as:  ZOLOFT  Take 50 mg by mouth daily. Take 1/2 tab by mouth once daily     simvastatin 40 MG tablet  Commonly known as:  ZOCOR  Take 1 tablet (40 mg total) by mouth at bedtime.     torsemide 20 MG tablet  Commonly known as:  DEMADEX  Take 2 tablets (40 mg total) by mouth daily.     Vitamin D (Ergocalciferol) 50000 UNITS Caps  Commonly known as:  DRISDOL  Take 50,000 Units by mouth 2 (two) times a week. Take on Mon and Thurs.          The results of significant diagnostics from this hospitalization (including imaging, microbiology, ancillary and laboratory) are listed below for reference.    Significant Diagnostic Studies: US Renal  09/10/2012  *RADIOLOGY REPORT*   Clinical Data: Acute renal failure.  RENAL/URINARY TRACT ULTRASOUND COMPLETE  Comparison:  CT urogram 11/05/2011  Findings:  Right Kidney:  The right kidney measures 12.4 cm length.  Normal parenchymal echotexture and thickness.  No focal cystic or solid lesions demonstrated.  No hydronephrosis.  Left Kidney:  The left kidney measures 11.6 cm length.  Normal parenchymal echotexture and thickness.  No focal cystic or solid lesions demonstrated.  No hydronephrosis.  Bladder:  The  bladder is incompletely distended but the wall does not appear to be thickened.  Incidental note of a simple appearing cyst in the spleen measuring 3.8 x 3.6 x 3.5 cm diameter.  This corresponds to the lesions seen on the prior CT scan.  IMPRESSION: Normal ultrasound appearance of the kidneys.  No evidence of obstruction.  Incidental note of a simple appearing cyst in the spleen, likely benign.   Original Report Authenticated By: Burman Nieves, M.D.    Dg Chest Portable 1 View  09/10/2012  *RADIOLOGY REPORT*  Clinical Data: Abnormal labs.  Apnea.  PORTABLE CHEST - 1 VIEW  Comparison: 03/24/2012  Findings: Cardiac enlargement with mild to pulmonary vascular congestion.  No edema or consolidation.  No blunting of costophrenic angles.  No pneumothorax.  Mediastinal contours appear intact.  Tortuous aorta.  No significant change since previous study.  IMPRESSION: Cardiac enlargement with mild pulmonary vascular prominence.  No edema or consolidation.   Original Report Authenticated By: Burman Nieves, M.D.     Microbiology: Recent Results (from the past 240 hour(s))  MRSA PCR SCREENING     Status: None   Collection Time    09/10/12  6:45 AM      Result Value Range Status   MRSA by PCR NEGATIVE  NEGATIVE Final   Comment:            The GeneXpert MRSA Assay (FDA     approved for NASAL specimens     only), is one component of a     comprehensive MRSA colonization     surveillance program. It is not     intended to diagnose MRSA      infection nor to guide or     monitor treatment for     MRSA infections.     Labs: Basic Metabolic Panel:  Recent Labs Lab 09/10/12 0448 09/10/12 1204 09/10/12 1436 09/11/12 0600 09/12/12 0700  NA 133* 131* 135 139 140  K 7.0* 7.4* 6.0* 5.3* 4.5  CL 104 104 105 105 102  CO2 16* 15* 16* 23 25  GLUCOSE 173* 156* 224* 118* 113*  BUN 83* 80* 79* 75* 62*  CREATININE 5.14* 5.00* 4.98* 4.62* 3.67*  CALCIUM 9.6 9.8 9.8 9.6 9.8  PHOS  --   --  5.9* 6.2* 4.8*   Liver Function Tests:  Recent Labs Lab 09/10/12 0134 09/10/12 1436 09/11/12 0600 09/12/12 0700  AST 20  --   --   --   ALT 24  --   --   --   ALKPHOS 72  --   --   --   BILITOT 0.1*  --   --   --   PROT 7.7  --   --   --   ALBUMIN 3.4* 3.3* 3.1* 3.3*   No results found for this basename: LIPASE, AMYLASE,  in the last 168 hours No results found for this basename: AMMONIA,  in the last 168 hours CBC:  Recent Labs Lab 09/10/12 0134 09/10/12 0448 09/11/12 0600 09/12/12 0700  WBC 10.0 10.4 9.1 11.3*  NEUTROABS 6.2  --   --   --   HGB 9.7* 9.2* 9.5* 10.0*  HCT 29.4* 27.9* 28.9* 30.6*  MCV 87.2 87.2 86.8 87.2  PLT 180 154 189 197   Cardiac Enzymes: No results found for this basename: CKTOTAL, CKMB, CKMBINDEX, TROPONINI,  in the last 168 hours BNP: BNP (last 3 results)  Recent Labs  04/21/12 1200 04/28/12 0615  PROBNP 1055.0* 331.5*  CBG:  Recent Labs Lab 09/11/12 1137 09/11/12 1650 09/11/12 2047 09/12/12 0734 09/12/12 1147  GLUCAP 170* 151* 192* 119* 205*       Signed:  Lonald Troiani  [facility] 09/12/2012, 3:29 PM

## 2012-09-15 DIAGNOSIS — I259 Chronic ischemic heart disease, unspecified: Secondary | ICD-10-CM | POA: Diagnosis not present

## 2012-09-15 DIAGNOSIS — E1065 Type 1 diabetes mellitus with hyperglycemia: Secondary | ICD-10-CM | POA: Diagnosis not present

## 2012-09-16 DIAGNOSIS — I5032 Chronic diastolic (congestive) heart failure: Secondary | ICD-10-CM | POA: Diagnosis not present

## 2012-09-16 DIAGNOSIS — I509 Heart failure, unspecified: Secondary | ICD-10-CM | POA: Diagnosis not present

## 2012-09-16 DIAGNOSIS — M159 Polyosteoarthritis, unspecified: Secondary | ICD-10-CM | POA: Diagnosis not present

## 2012-09-16 DIAGNOSIS — I10 Essential (primary) hypertension: Secondary | ICD-10-CM | POA: Diagnosis not present

## 2012-09-16 DIAGNOSIS — G4733 Obstructive sleep apnea (adult) (pediatric): Secondary | ICD-10-CM | POA: Diagnosis not present

## 2012-09-16 DIAGNOSIS — E119 Type 2 diabetes mellitus without complications: Secondary | ICD-10-CM | POA: Diagnosis not present

## 2012-09-19 DIAGNOSIS — I509 Heart failure, unspecified: Secondary | ICD-10-CM | POA: Diagnosis not present

## 2012-09-19 DIAGNOSIS — I5032 Chronic diastolic (congestive) heart failure: Secondary | ICD-10-CM | POA: Diagnosis not present

## 2012-09-19 DIAGNOSIS — E119 Type 2 diabetes mellitus without complications: Secondary | ICD-10-CM | POA: Diagnosis not present

## 2012-09-19 DIAGNOSIS — M159 Polyosteoarthritis, unspecified: Secondary | ICD-10-CM | POA: Diagnosis not present

## 2012-09-19 DIAGNOSIS — I10 Essential (primary) hypertension: Secondary | ICD-10-CM | POA: Diagnosis not present

## 2012-09-19 DIAGNOSIS — G4733 Obstructive sleep apnea (adult) (pediatric): Secondary | ICD-10-CM | POA: Diagnosis not present

## 2012-09-20 DIAGNOSIS — E119 Type 2 diabetes mellitus without complications: Secondary | ICD-10-CM | POA: Diagnosis not present

## 2012-09-20 DIAGNOSIS — I509 Heart failure, unspecified: Secondary | ICD-10-CM | POA: Diagnosis not present

## 2012-09-20 DIAGNOSIS — G4733 Obstructive sleep apnea (adult) (pediatric): Secondary | ICD-10-CM | POA: Diagnosis not present

## 2012-09-20 DIAGNOSIS — I10 Essential (primary) hypertension: Secondary | ICD-10-CM | POA: Diagnosis not present

## 2012-09-20 DIAGNOSIS — M159 Polyosteoarthritis, unspecified: Secondary | ICD-10-CM | POA: Diagnosis not present

## 2012-09-20 DIAGNOSIS — I5032 Chronic diastolic (congestive) heart failure: Secondary | ICD-10-CM | POA: Diagnosis not present

## 2012-09-21 DIAGNOSIS — I10 Essential (primary) hypertension: Secondary | ICD-10-CM | POA: Diagnosis not present

## 2012-09-21 DIAGNOSIS — M159 Polyosteoarthritis, unspecified: Secondary | ICD-10-CM | POA: Diagnosis not present

## 2012-09-21 DIAGNOSIS — I509 Heart failure, unspecified: Secondary | ICD-10-CM | POA: Diagnosis not present

## 2012-09-21 DIAGNOSIS — G4733 Obstructive sleep apnea (adult) (pediatric): Secondary | ICD-10-CM | POA: Diagnosis not present

## 2012-09-21 DIAGNOSIS — I5032 Chronic diastolic (congestive) heart failure: Secondary | ICD-10-CM | POA: Diagnosis not present

## 2012-09-21 DIAGNOSIS — E119 Type 2 diabetes mellitus without complications: Secondary | ICD-10-CM | POA: Diagnosis not present

## 2012-09-22 ENCOUNTER — Other Ambulatory Visit (HOSPITAL_BASED_OUTPATIENT_CLINIC_OR_DEPARTMENT_OTHER): Payer: Self-pay | Admitting: Internal Medicine

## 2012-09-22 ENCOUNTER — Encounter (HOSPITAL_BASED_OUTPATIENT_CLINIC_OR_DEPARTMENT_OTHER): Payer: Medicare Other | Attending: Internal Medicine

## 2012-09-22 ENCOUNTER — Ambulatory Visit (HOSPITAL_COMMUNITY)
Admission: RE | Admit: 2012-09-22 | Discharge: 2012-09-22 | Disposition: A | Discharge status: Home / Self Care | Payer: Medicare Other | Source: Ambulatory Visit | Attending: Internal Medicine | Admitting: Internal Medicine

## 2012-09-22 DIAGNOSIS — M869 Osteomyelitis, unspecified: Secondary | ICD-10-CM

## 2012-09-22 DIAGNOSIS — M86679 Other chronic osteomyelitis, unspecified ankle and foot: Secondary | ICD-10-CM | POA: Insufficient documentation

## 2012-09-22 DIAGNOSIS — L988 Other specified disorders of the skin and subcutaneous tissue: Secondary | ICD-10-CM | POA: Diagnosis not present

## 2012-09-22 DIAGNOSIS — D518 Other vitamin B12 deficiency anemias: Secondary | ICD-10-CM | POA: Diagnosis not present

## 2012-09-22 DIAGNOSIS — E662 Morbid (severe) obesity with alveolar hypoventilation: Secondary | ICD-10-CM | POA: Diagnosis not present

## 2012-09-22 DIAGNOSIS — E1169 Type 2 diabetes mellitus with other specified complication: Secondary | ICD-10-CM | POA: Diagnosis not present

## 2012-09-22 DIAGNOSIS — L97509 Non-pressure chronic ulcer of other part of unspecified foot with unspecified severity: Secondary | ICD-10-CM | POA: Diagnosis not present

## 2012-09-22 DIAGNOSIS — M2350 Chronic instability of knee, unspecified knee: Secondary | ICD-10-CM | POA: Diagnosis not present

## 2012-09-24 DIAGNOSIS — I509 Heart failure, unspecified: Secondary | ICD-10-CM | POA: Diagnosis not present

## 2012-09-24 DIAGNOSIS — E119 Type 2 diabetes mellitus without complications: Secondary | ICD-10-CM | POA: Diagnosis not present

## 2012-09-24 DIAGNOSIS — M159 Polyosteoarthritis, unspecified: Secondary | ICD-10-CM | POA: Diagnosis not present

## 2012-09-24 DIAGNOSIS — I10 Essential (primary) hypertension: Secondary | ICD-10-CM | POA: Diagnosis not present

## 2012-09-24 DIAGNOSIS — I5032 Chronic diastolic (congestive) heart failure: Secondary | ICD-10-CM | POA: Diagnosis not present

## 2012-09-24 DIAGNOSIS — G4733 Obstructive sleep apnea (adult) (pediatric): Secondary | ICD-10-CM | POA: Diagnosis not present

## 2012-09-26 DIAGNOSIS — E119 Type 2 diabetes mellitus without complications: Secondary | ICD-10-CM | POA: Diagnosis not present

## 2012-09-26 DIAGNOSIS — I5032 Chronic diastolic (congestive) heart failure: Secondary | ICD-10-CM | POA: Diagnosis not present

## 2012-09-26 DIAGNOSIS — M159 Polyosteoarthritis, unspecified: Secondary | ICD-10-CM | POA: Diagnosis not present

## 2012-09-26 DIAGNOSIS — G4733 Obstructive sleep apnea (adult) (pediatric): Secondary | ICD-10-CM | POA: Diagnosis not present

## 2012-09-26 DIAGNOSIS — I509 Heart failure, unspecified: Secondary | ICD-10-CM | POA: Diagnosis not present

## 2012-09-26 DIAGNOSIS — I10 Essential (primary) hypertension: Secondary | ICD-10-CM | POA: Diagnosis not present

## 2012-09-27 DIAGNOSIS — M159 Polyosteoarthritis, unspecified: Secondary | ICD-10-CM | POA: Diagnosis not present

## 2012-09-27 DIAGNOSIS — I509 Heart failure, unspecified: Secondary | ICD-10-CM | POA: Diagnosis not present

## 2012-09-27 DIAGNOSIS — G4733 Obstructive sleep apnea (adult) (pediatric): Secondary | ICD-10-CM | POA: Diagnosis not present

## 2012-09-27 DIAGNOSIS — I5032 Chronic diastolic (congestive) heart failure: Secondary | ICD-10-CM | POA: Diagnosis not present

## 2012-09-27 DIAGNOSIS — I10 Essential (primary) hypertension: Secondary | ICD-10-CM | POA: Diagnosis not present

## 2012-09-27 DIAGNOSIS — E119 Type 2 diabetes mellitus without complications: Secondary | ICD-10-CM | POA: Diagnosis not present

## 2012-09-28 DIAGNOSIS — G4733 Obstructive sleep apnea (adult) (pediatric): Secondary | ICD-10-CM | POA: Diagnosis not present

## 2012-09-28 DIAGNOSIS — M159 Polyosteoarthritis, unspecified: Secondary | ICD-10-CM | POA: Diagnosis not present

## 2012-09-28 DIAGNOSIS — I5032 Chronic diastolic (congestive) heart failure: Secondary | ICD-10-CM | POA: Diagnosis not present

## 2012-09-28 DIAGNOSIS — I509 Heart failure, unspecified: Secondary | ICD-10-CM | POA: Diagnosis not present

## 2012-09-28 DIAGNOSIS — I10 Essential (primary) hypertension: Secondary | ICD-10-CM | POA: Diagnosis not present

## 2012-09-28 DIAGNOSIS — E119 Type 2 diabetes mellitus without complications: Secondary | ICD-10-CM | POA: Diagnosis not present

## 2012-09-28 DIAGNOSIS — N390 Urinary tract infection, site not specified: Secondary | ICD-10-CM | POA: Diagnosis not present

## 2012-09-28 DIAGNOSIS — M2350 Chronic instability of knee, unspecified knee: Secondary | ICD-10-CM | POA: Diagnosis not present

## 2012-09-29 DIAGNOSIS — E1169 Type 2 diabetes mellitus with other specified complication: Secondary | ICD-10-CM | POA: Diagnosis not present

## 2012-09-29 DIAGNOSIS — M86679 Other chronic osteomyelitis, unspecified ankle and foot: Secondary | ICD-10-CM | POA: Diagnosis not present

## 2012-09-29 DIAGNOSIS — E109 Type 1 diabetes mellitus without complications: Secondary | ICD-10-CM | POA: Diagnosis not present

## 2012-09-29 DIAGNOSIS — L97509 Non-pressure chronic ulcer of other part of unspecified foot with unspecified severity: Secondary | ICD-10-CM | POA: Diagnosis not present

## 2012-10-03 DIAGNOSIS — M86179 Other acute osteomyelitis, unspecified ankle and foot: Secondary | ICD-10-CM | POA: Diagnosis not present

## 2012-10-03 DIAGNOSIS — I70269 Atherosclerosis of native arteries of extremities with gangrene, unspecified extremity: Secondary | ICD-10-CM | POA: Diagnosis not present

## 2012-10-03 DIAGNOSIS — E1149 Type 2 diabetes mellitus with other diabetic neurological complication: Secondary | ICD-10-CM | POA: Diagnosis not present

## 2012-10-04 ENCOUNTER — Encounter (HOSPITAL_COMMUNITY): Payer: Self-pay | Admitting: Pharmacy Technician

## 2012-10-04 ENCOUNTER — Other Ambulatory Visit (HOSPITAL_COMMUNITY): Payer: Self-pay | Admitting: Orthopedic Surgery

## 2012-10-04 DIAGNOSIS — E119 Type 2 diabetes mellitus without complications: Secondary | ICD-10-CM | POA: Diagnosis not present

## 2012-10-04 DIAGNOSIS — G4733 Obstructive sleep apnea (adult) (pediatric): Secondary | ICD-10-CM | POA: Diagnosis not present

## 2012-10-04 DIAGNOSIS — I509 Heart failure, unspecified: Secondary | ICD-10-CM | POA: Diagnosis not present

## 2012-10-04 DIAGNOSIS — M159 Polyosteoarthritis, unspecified: Secondary | ICD-10-CM | POA: Diagnosis not present

## 2012-10-04 DIAGNOSIS — I5032 Chronic diastolic (congestive) heart failure: Secondary | ICD-10-CM | POA: Diagnosis not present

## 2012-10-04 DIAGNOSIS — I10 Essential (primary) hypertension: Secondary | ICD-10-CM | POA: Diagnosis not present

## 2012-10-05 ENCOUNTER — Encounter (HOSPITAL_COMMUNITY)
Admission: RE | Admit: 2012-10-05 | Discharge: 2012-10-05 | Disposition: A | Discharge status: Home / Self Care | Payer: Medicare Other | Source: Ambulatory Visit | Attending: Orthopedic Surgery | Admitting: Orthopedic Surgery

## 2012-10-05 ENCOUNTER — Encounter (HOSPITAL_COMMUNITY): Payer: Self-pay

## 2012-10-05 DIAGNOSIS — M869 Osteomyelitis, unspecified: Secondary | ICD-10-CM | POA: Diagnosis not present

## 2012-10-05 DIAGNOSIS — Z794 Long term (current) use of insulin: Secondary | ICD-10-CM | POA: Diagnosis not present

## 2012-10-05 DIAGNOSIS — M129 Arthropathy, unspecified: Secondary | ICD-10-CM | POA: Diagnosis not present

## 2012-10-05 DIAGNOSIS — E662 Morbid (severe) obesity with alveolar hypoventilation: Secondary | ICD-10-CM | POA: Diagnosis not present

## 2012-10-05 DIAGNOSIS — Z86718 Personal history of other venous thrombosis and embolism: Secondary | ICD-10-CM | POA: Diagnosis not present

## 2012-10-05 DIAGNOSIS — L97509 Non-pressure chronic ulcer of other part of unspecified foot with unspecified severity: Secondary | ICD-10-CM | POA: Diagnosis not present

## 2012-10-05 DIAGNOSIS — I509 Heart failure, unspecified: Secondary | ICD-10-CM | POA: Diagnosis not present

## 2012-10-05 DIAGNOSIS — E1169 Type 2 diabetes mellitus with other specified complication: Secondary | ICD-10-CM | POA: Diagnosis not present

## 2012-10-05 DIAGNOSIS — N189 Chronic kidney disease, unspecified: Secondary | ICD-10-CM | POA: Diagnosis not present

## 2012-10-05 DIAGNOSIS — Z888 Allergy status to other drugs, medicaments and biological substances status: Secondary | ICD-10-CM | POA: Diagnosis not present

## 2012-10-05 DIAGNOSIS — Z885 Allergy status to narcotic agent status: Secondary | ICD-10-CM | POA: Diagnosis not present

## 2012-10-05 DIAGNOSIS — I658 Occlusion and stenosis of other precerebral arteries: Secondary | ICD-10-CM | POA: Diagnosis not present

## 2012-10-05 DIAGNOSIS — Z85038 Personal history of other malignant neoplasm of large intestine: Secondary | ICD-10-CM | POA: Diagnosis not present

## 2012-10-05 DIAGNOSIS — I6529 Occlusion and stenosis of unspecified carotid artery: Secondary | ICD-10-CM | POA: Diagnosis not present

## 2012-10-05 DIAGNOSIS — I129 Hypertensive chronic kidney disease with stage 1 through stage 4 chronic kidney disease, or unspecified chronic kidney disease: Secondary | ICD-10-CM | POA: Diagnosis not present

## 2012-10-05 LAB — COMPREHENSIVE METABOLIC PANEL
Alkaline Phosphatase: 81 U/L (ref 39–117)
BUN: 37 mg/dL — ABNORMAL HIGH (ref 6–23)
Chloride: 101 mEq/L (ref 96–112)
Creatinine, Ser: 1.34 mg/dL — ABNORMAL HIGH (ref 0.50–1.10)
GFR calc Af Amer: 46 mL/min — ABNORMAL LOW (ref >90)
GFR calc non Af Amer: 40 mL/min — ABNORMAL LOW (ref >90)
Glucose, Bld: 195 mg/dL — ABNORMAL HIGH (ref 70–99)
Potassium: 3.6 mEq/L (ref 3.5–5.1)
Total Bilirubin: 0.2 mg/dL — ABNORMAL LOW (ref 0.3–1.2)

## 2012-10-05 LAB — CBC
HCT: 33.3 % — ABNORMAL LOW (ref 36.0–46.0)
Hemoglobin: 10.5 g/dL — ABNORMAL LOW (ref 12.0–15.0)
MCV: 88.6 fL (ref 78.0–100.0)
RDW: 14.9 % (ref 11.5–15.5)
WBC: 13.6 10*3/uL — ABNORMAL HIGH (ref 4.0–10.5)

## 2012-10-05 LAB — APTT: aPTT: 27 seconds (ref 24–37)

## 2012-10-05 LAB — PROTIME-INR
INR: 1.19 (ref 0.00–1.49)
Prothrombin Time: 14.9 seconds (ref 11.6–15.2)

## 2012-10-05 NOTE — Pre-Procedure Instructions (Signed)
[name]  10/05/2012   Your procedure is scheduled on:  Fri,  Mar 21 @ 11:30 AM  Report to [facility] at 9:30 AM.  Call this number if you have problems the morning of surgery: [phone]   Remember:   Do not eat food or drink liquids after midnight.   Take these medicines the morning of surgery with A SIP OF WATER: Carvedilol(Coreg),Ativan(Lorazepam), and Zoloft(Sertraline)   Do not wear jewelry, make-up or nail polish.  Do not wear lotions, powders, or perfumes. You may wear deodorant.  Do not shave 48 hours prior to surgery.   Do not bring valuables to the hospital.  Contacts, dentures or bridgework may not be worn into surgery.  Leave suitcase in the car. After surgery it may be brought to your room.  For patients admitted to the hospital, checkout time is 11:00 AM the day of  discharge.   Patients discharged the day of surgery will not be allowed to drive  home.  Special Instructions: Shower using CHG 2 nights before surgery and the night before surgery.  If you shower the day of surgery use CHG.  Use special wash - you have one bottle of CHG for all showers.  You should use approximately 1/3 of the bottle for each shower.   Please read over the following fact sheets that you were given: Pain Booklet, Coughing and Deep Breathing, MRSA Information and Surgical Site Infection Prevention

## 2012-10-06 ENCOUNTER — Other Ambulatory Visit (HOSPITAL_COMMUNITY): Payer: Self-pay | Admitting: Orthopedic Surgery

## 2012-10-06 MED ORDER — DEXTROSE 5 % IV SOLN
3.0000 g | INTRAVENOUS | Status: AC
  Administered 2012-10-07: 3 g via INTRAVENOUS
  Filled 2012-10-06: qty 3000

## 2012-10-07 ENCOUNTER — Ambulatory Visit (HOSPITAL_COMMUNITY)
Admission: RE | Admit: 2012-10-07 | Discharge: 2012-10-07 | Disposition: A | Discharge status: Home / Self Care | Payer: Medicare Other | Source: Ambulatory Visit | Attending: Orthopedic Surgery | Admitting: Orthopedic Surgery

## 2012-10-07 ENCOUNTER — Encounter (HOSPITAL_COMMUNITY)
Admission: RE | Disposition: A | Discharge status: Home / Self Care | Payer: Self-pay | Source: Ambulatory Visit | Attending: Orthopedic Surgery

## 2012-10-07 ENCOUNTER — Encounter (HOSPITAL_COMMUNITY): Payer: Self-pay | Admitting: Anesthesiology

## 2012-10-07 ENCOUNTER — Encounter (HOSPITAL_COMMUNITY): Payer: Self-pay | Admitting: Certified Registered"

## 2012-10-07 ENCOUNTER — Ambulatory Visit (HOSPITAL_COMMUNITY): Payer: Medicare Other | Admitting: Certified Registered"

## 2012-10-07 DIAGNOSIS — L97509 Non-pressure chronic ulcer of other part of unspecified foot with unspecified severity: Secondary | ICD-10-CM | POA: Insufficient documentation

## 2012-10-07 DIAGNOSIS — N189 Chronic kidney disease, unspecified: Secondary | ICD-10-CM | POA: Insufficient documentation

## 2012-10-07 DIAGNOSIS — Z885 Allergy status to narcotic agent status: Secondary | ICD-10-CM | POA: Insufficient documentation

## 2012-10-07 DIAGNOSIS — I509 Heart failure, unspecified: Secondary | ICD-10-CM | POA: Insufficient documentation

## 2012-10-07 DIAGNOSIS — Z86718 Personal history of other venous thrombosis and embolism: Secondary | ICD-10-CM | POA: Insufficient documentation

## 2012-10-07 DIAGNOSIS — E1149 Type 2 diabetes mellitus with other diabetic neurological complication: Secondary | ICD-10-CM | POA: Diagnosis not present

## 2012-10-07 DIAGNOSIS — I129 Hypertensive chronic kidney disease with stage 1 through stage 4 chronic kidney disease, or unspecified chronic kidney disease: Secondary | ICD-10-CM | POA: Insufficient documentation

## 2012-10-07 DIAGNOSIS — Z85038 Personal history of other malignant neoplasm of large intestine: Secondary | ICD-10-CM | POA: Insufficient documentation

## 2012-10-07 DIAGNOSIS — M869 Osteomyelitis, unspecified: Secondary | ICD-10-CM | POA: Insufficient documentation

## 2012-10-07 DIAGNOSIS — I70269 Atherosclerosis of native arteries of extremities with gangrene, unspecified extremity: Secondary | ICD-10-CM | POA: Diagnosis not present

## 2012-10-07 DIAGNOSIS — I658 Occlusion and stenosis of other precerebral arteries: Secondary | ICD-10-CM | POA: Insufficient documentation

## 2012-10-07 DIAGNOSIS — M86179 Other acute osteomyelitis, unspecified ankle and foot: Secondary | ICD-10-CM | POA: Diagnosis not present

## 2012-10-07 DIAGNOSIS — I6529 Occlusion and stenosis of unspecified carotid artery: Secondary | ICD-10-CM | POA: Insufficient documentation

## 2012-10-07 DIAGNOSIS — E662 Morbid (severe) obesity with alveolar hypoventilation: Secondary | ICD-10-CM | POA: Insufficient documentation

## 2012-10-07 DIAGNOSIS — G8918 Other acute postprocedural pain: Secondary | ICD-10-CM | POA: Diagnosis not present

## 2012-10-07 DIAGNOSIS — Z794 Long term (current) use of insulin: Secondary | ICD-10-CM | POA: Insufficient documentation

## 2012-10-07 DIAGNOSIS — M129 Arthropathy, unspecified: Secondary | ICD-10-CM | POA: Insufficient documentation

## 2012-10-07 DIAGNOSIS — E1169 Type 2 diabetes mellitus with other specified complication: Secondary | ICD-10-CM | POA: Diagnosis not present

## 2012-10-07 DIAGNOSIS — Z888 Allergy status to other drugs, medicaments and biological substances status: Secondary | ICD-10-CM | POA: Insufficient documentation

## 2012-10-07 HISTORY — PX: AMPUTATION: SHX166

## 2012-10-07 LAB — GLUCOSE, CAPILLARY
Glucose-Capillary: 167 mg/dL — ABNORMAL HIGH (ref 70–99)
Glucose-Capillary: 143 mg/dL — ABNORMAL HIGH (ref 70–99)

## 2012-10-07 LAB — POTASSIUM: Potassium: 3.8 mEq/L (ref 3.5–5.1)

## 2012-10-07 SURGERY — AMPUTATION, FOOT, RAY
Anesthesia: Monitor Anesthesia Care | Site: Foot | Laterality: Left | Wound class: Dirty or Infected

## 2012-10-07 MED ORDER — LIDOCAINE HCL (CARDIAC) 20 MG/ML IV SOLN
INTRAVENOUS | Status: DC | PRN
  Administered 2012-10-07: 20 mg via INTRAVENOUS

## 2012-10-07 MED ORDER — ACETAMINOPHEN 10 MG/ML IV SOLN: 1000.0000 mg | Freq: Once | INTRAVENOUS | Status: DC | PRN

## 2012-10-07 MED ORDER — ROPIVACAINE HCL 5 MG/ML IJ SOLN
INTRAMUSCULAR | Status: DC | PRN
  Administered 2012-10-07: 40 mL via EPIDURAL

## 2012-10-07 MED ORDER — HYDROCODONE-ACETAMINOPHEN 7.5-325 MG PO TABS: 1.0000 | ORAL_TABLET | ORAL | Status: DC | PRN

## 2012-10-07 MED ORDER — HYDROMORPHONE HCL PF 1 MG/ML IJ SOLN: 0.2500 mg | INTRAMUSCULAR | Status: DC | PRN

## 2012-10-07 MED ORDER — MIDAZOLAM HCL 2 MG/2ML IJ SOLN
INTRAMUSCULAR | Status: AC
  Filled 2012-10-07: qty 2

## 2012-10-07 MED ORDER — ONDANSETRON HCL 4 MG/2ML IJ SOLN
INTRAMUSCULAR | Status: DC | PRN
  Administered 2012-10-07: 4 mg via INTRAVENOUS

## 2012-10-07 MED ORDER — PROMETHAZINE HCL 25 MG/ML IJ SOLN: 6.2500 mg | INTRAMUSCULAR | Status: DC | PRN

## 2012-10-07 MED ORDER — PROPOFOL 10 MG/ML IV BOLUS
INTRAVENOUS | Status: DC | PRN
  Administered 2012-10-07 (×3): 20 mg via INTRAVENOUS

## 2012-10-07 MED ORDER — 0.9 % SODIUM CHLORIDE (POUR BTL) OPTIME
TOPICAL | Status: DC | PRN
  Administered 2012-10-07: 1000 mL

## 2012-10-07 MED ORDER — FENTANYL CITRATE 0.05 MG/ML IJ SOLN
INTRAMUSCULAR | Status: AC
  Administered 2012-10-07: 100 ug via INTRAVENOUS
  Filled 2012-10-07: qty 2

## 2012-10-07 MED ORDER — LACTATED RINGERS IV SOLN
INTRAVENOUS | Status: DC | PRN
  Administered 2012-10-07: 12:00:00 via INTRAVENOUS

## 2012-10-07 MED ORDER — LACTATED RINGERS IV SOLN
INTRAVENOUS | Status: DC
  Administered 2012-10-07: 12:00:00 via INTRAVENOUS

## 2012-10-07 SURGICAL SUPPLY — 42 items
BANDAGE ESMARK 6X9 LF (GAUZE/BANDAGES/DRESSINGS) IMPLANT
BANDAGE GAUZE ELAST BULKY 4 IN (GAUZE/BANDAGES/DRESSINGS) ×1 IMPLANT
BLADE SAW SGTL MED 73X18.5 STR (BLADE) IMPLANT
BNDG CMPR 9X6 STRL LF SNTH (GAUZE/BANDAGES/DRESSINGS) ×1
BNDG COHESIVE 4X5 TAN STRL (GAUZE/BANDAGES/DRESSINGS) ×2 IMPLANT
BNDG COHESIVE 6X5 TAN STRL LF (GAUZE/BANDAGES/DRESSINGS) ×2 IMPLANT
BNDG ESMARK 6X9 LF (GAUZE/BANDAGES/DRESSINGS) ×2
CLOTH BEACON ORANGE TIMEOUT ST (SAFETY) ×2 IMPLANT
CUFF TOURNIQUET SINGLE 34IN LL (TOURNIQUET CUFF) IMPLANT
CUFF TOURNIQUET SINGLE 44IN (TOURNIQUET CUFF) IMPLANT
DRAPE U-SHAPE 47X51 STRL (DRAPES) ×4 IMPLANT
DRSG ADAPTIC 3X8 NADH LF (GAUZE/BANDAGES/DRESSINGS) ×2 IMPLANT
DRSG PAD ABDOMINAL 8X10 ST (GAUZE/BANDAGES/DRESSINGS) ×1 IMPLANT
DURAPREP 26ML APPLICATOR (WOUND CARE) ×2 IMPLANT
ELECT REM PT RETURN 9FT ADLT (ELECTROSURGICAL) ×2
ELECTRODE REM PT RTRN 9FT ADLT (ELECTROSURGICAL) ×1 IMPLANT
GLOVE BIOGEL PI IND STRL 9 (GLOVE) ×1 IMPLANT
GLOVE BIOGEL PI INDICATOR 9 (GLOVE) ×1
GLOVE SURG ORTHO 9.0 STRL STRW (GLOVE) ×2 IMPLANT
GLOVE SURG SS PI 7.0 STRL IVOR (GLOVE) ×1 IMPLANT
GOWN PREVENTION PLUS XLARGE (GOWN DISPOSABLE) ×2 IMPLANT
GOWN SRG XL XLNG 56XLVL 4 (GOWN DISPOSABLE) ×1 IMPLANT
GOWN STRL NON-REIN XL XLG LVL4 (GOWN DISPOSABLE) ×2
KIT BASIN OR (CUSTOM PROCEDURE TRAY) ×2 IMPLANT
KIT ROOM TURNOVER OR (KITS) ×2 IMPLANT
MANIFOLD NEPTUNE II (INSTRUMENTS) ×2 IMPLANT
NS IRRIG 1000ML POUR BTL (IV SOLUTION) ×2 IMPLANT
PACK ORTHO EXTREMITY (CUSTOM PROCEDURE TRAY) ×2 IMPLANT
PAD ARMBOARD 7.5X6 YLW CONV (MISCELLANEOUS) ×4 IMPLANT
PAD CAST 4YDX4 CTTN HI CHSV (CAST SUPPLIES) ×1 IMPLANT
PADDING CAST COTTON 4X4 STRL (CAST SUPPLIES) ×2
SPONGE GAUZE 4X4 12PLY (GAUZE/BANDAGES/DRESSINGS) ×2 IMPLANT
SPONGE LAP 18X18 X RAY DECT (DISPOSABLE) ×2 IMPLANT
STAPLER VISISTAT 35W (STAPLE) ×2 IMPLANT
STOCKINETTE IMPERVIOUS LG (DRAPES) IMPLANT
SUCTION FRAZIER TIP 10 FR DISP (SUCTIONS) ×2 IMPLANT
SUT ETHILON 2 0 PSLX (SUTURE) ×4 IMPLANT
TOWEL OR 17X24 6PK STRL BLUE (TOWEL DISPOSABLE) ×2 IMPLANT
TOWEL OR 17X26 10 PK STRL BLUE (TOWEL DISPOSABLE) ×2 IMPLANT
TUBE CONNECTING 12X1/4 (SUCTIONS) ×2 IMPLANT
UNDERPAD 30X30 INCONTINENT (UNDERPADS AND DIAPERS) ×2 IMPLANT
WATER STERILE IRR 1000ML POUR (IV SOLUTION) ×2 IMPLANT

## 2012-10-07 NOTE — Anesthesia Preprocedure Evaluation (Addendum)
Anesthesia Evaluation  Patient identified by MRN, date of birth, ID band Patient awake    Reviewed: Allergy & Precautions, H&P , NPO status , Patient's Chart, lab work & pertinent test results, reviewed documented beta blocker date and time   History of Anesthesia Complications Negative for: history of anesthetic complications  Airway Mallampati: II TM Distance: >3 FB Neck ROM: Full    Dental  (+) Dental Advisory Given   Pulmonary neg pulmonary ROS, sleep apnea and Continuous Positive Airway Pressure Ventilation ,  breath sounds clear to auscultation  Pulmonary exam normal       Cardiovascular Exercise Tolerance: Poor hypertension, Pt. on medications and Pt. on home beta blockers + Peripheral Vascular Disease, +CHF and DVT + dysrhythmias Rhythm:Regular Rate:Normal  Echo 03/2012 - Left ventricle: The cavity size was mildly dilated. Wall   thickness was increased in a pattern of mild LVH. The   estimated ejection fraction was 50%. Regional wall motion  abnormalities cannot be excluded. - Left atrium: The atrium was mildly to moderately dilated. - Right ventricle: Poorly visualized. The cavity size was   mildly dilated. Systolic function was mildly reduced. - Impressions: PA pressure can not be estimated. Technically  limited study.  Impressions: - PA pressure can not be estimated. Technically limited   study.     Neuro/Psych  Neuromuscular disease negative psych ROS   GI/Hepatic negative GI ROS, Neg liver ROS,   Endo/Other  diabetes (glu 137), Poorly Controlled, Type 2, Insulin DependentMorbid obesity  Renal/GU CRF and ARFRenal disease (creat 1.34)  negative genitourinary   Musculoskeletal negative musculoskeletal ROS (+)   Abdominal (+) + obese,   Peds negative pediatric ROS (+)  Hematology  (+) Blood dyscrasia, anemia ,   Anesthesia Other Findings   Reproductive/Obstetrics negative OB ROS                         Anesthesia Physical Anesthesia Plan  ASA: III  Anesthesia Plan: Regional and MAC   Post-op Pain Management:    Induction: Intravenous  Airway Management Planned: Simple Face Mask  Additional Equipment:   Intra-op Plan:   Post-operative Plan:   Informed Consent: I have reviewed the patients History and Physical, chart, labs and discussed the procedure including the risks, benefits and alternatives for the proposed anesthesia with the patient or authorized representative who has indicated his/her understanding and acceptance.   Dental advisory given  Plan Discussed with: CRNA and Surgeon  Anesthesia Plan Comments: (Plan routine monitors, MAC with ankle block )      Anesthesia Quick Evaluation

## 2012-10-07 NOTE — Anesthesia Procedure Notes (Addendum)
Anesthesia Regional Block:  Ankle block  Pre-Anesthetic Checklist: ,, timeout performed, Correct Patient, Correct Site, Correct Laterality, Correct Procedure, Correct Position, site marked, Risks and benefits discussed,  Surgical consent,  Pre-op evaluation,  At surgeon's request and post-op pain management  Laterality: Left  Prep: chloraprep       Needles:  Injection technique: Single-shot  Needle Type: Quincke     Needle Length: 4cm  Needle Gauge: 25 and 25 G    Additional Needles:  Procedures: other  (perineural infiltration) Ankle block Narrative:  Start time: 10/07/2012 11:51 AM End time: 10/07/2012 12:59 PM Injection made incrementally with aspirations every 5 mL.  Performed by: Personally  Anesthesiologist: Sandford Craze, MD  Additional Notes: Pt identified in Holding room.  Monitors applied. Working IV access confirmed. Sterile prep L ankle.  #25ga perineural infiltration of Ropivacaine around deep and superficial peroneal, saph, sural, post tib nerves.  Total 40cc 0.5% Ropivacaine injected incrementally after negative test dose.  Patient asymptomatic, VSS, no heme aspirated, tolerated well.  Sandford Craze, MD  Ankle block Procedure Name: MAC Date/Time: 10/07/2012 12:18 PM Performed by: Lovie Chol Pre-anesthesia Checklist: Patient identified, Emergency Drugs available, Suction available, Patient being monitored and Timeout performed Patient Re-evaluated:Patient Re-evaluated prior to inductionOxygen Delivery Method: Simple face mask

## 2012-10-07 NOTE — Addendum Note (Signed)
Addendum created 10/07/12 1353 by Lovie Chol, CRNA   Modules edited: Anesthesia Medication Administration

## 2012-10-07 NOTE — Discharge Summary (Signed)
Discharge to home °

## 2012-10-07 NOTE — Anesthesia Postprocedure Evaluation (Signed)
  Anesthesia Post-op Note  Patient: [name]  Procedure(s) Performed: Procedure(s) with comments: AMPUTATION RAY (Left) - Left foot 1st Ray Amputation  Patient Location: PACU  Anesthesia Type:Regional  Level of Consciousness: awake, alert , oriented and patient cooperative  Airway and Oxygen Therapy: Patient Spontanous Breathing  Post-op Pain: none  Post-op Assessment: Post-op Vital signs reviewed, Patient's Cardiovascular Status Stable, Respiratory Function Stable, Patent Airway, No signs of Nausea or vomiting and Pain level controlled  Post-op Vital Signs: stable  Complications: No apparent anesthesia complications

## 2012-10-07 NOTE — Op Note (Signed)
OPERATIVE REPORT  DATE OF SURGERY: 10/07/2012  PATIENT:  [name],  67 y.o. female  PRE-OPERATIVE DIAGNOSIS:  Osteomyelitis Left Great Toe  POST-OPERATIVE DIAGNOSIS:  Osteomyelitis Left Great Toe  PROCEDURE:  Procedure(s): AMPUTATION RAY  SURGEON:  Surgeon(s): Nadara Mustard, MD  ANESTHESIA:   regional  EBL:  min ML  SPECIMEN:  Source of Specimen:  Left foot great toe  TOURNIQUET:  * No tourniquets in log *  PROCEDURE DETAILS: Patient is a 67 year old woman with chronic myelitis left foot great toe she has failed conservative wound care and presents at this time for surgical intervention. Risks and benefits were discussed including infection neurovascular injury nonhealing of the wound need for higher level amputation. Patient states she understands and wished to proceed at this time. Description of procedure patient was brought to the operating room after any ankle block. After adequate levels and anesthesia obtained patient's left lower extremity was prepped using DuraPrep draped into a sterile field. A racquet incision was made around the first metatarsal and great toe the first ray metatarsal and toe were resected in one block of tissue. The wound is irrigated with normal saline. Electrocautery was used for hemostasis. The wound was closed using 2-0 nylon. The wound was covered with Adaptic orthopedic sponges AB dressing Kerlix and Coban. Patient was taken to the PACU in stable condition.  PLAN OF CARE: Discharge to home after PACU  PATIENT DISPOSITION:  PACU - hemodynamically stable.   Nadara Mustard, MD 10/07/2012 12:47 PM

## 2012-10-07 NOTE — Transfer of Care (Signed)
Immediate Anesthesia Transfer of Care Note  Patient: [name]  Procedure(s) Performed: Procedure(s) with comments: AMPUTATION RAY (Left) - Left foot 1st Ray Amputation  Patient Location: PACU  Anesthesia Type:MAC  Level of Consciousness: awake, alert , oriented and patient cooperative  Airway & Oxygen Therapy: Patient Spontanous Breathing and Patient connected to nasal cannula oxygen  Post-op Assessment: Report given to PACU RN and Post -op Vital signs reviewed and stable  Post vital signs: Reviewed and stable  Complications: No apparent anesthesia complications

## 2012-10-07 NOTE — Preoperative (Signed)
Beta Blockers   Reason not to administer Beta Blockers:Not Applicable 

## 2012-10-07 NOTE — H&P (Signed)
[name] is an 67 y.o. female.   Chief Complaint: Osteomyelitis abscess left foot great toe HPI: Patient is a 67 year old woman diabetic who has failed conservative wound care with ulcer osteomyelitis left foot great toe  Past Medical History  Diagnosis Date  . OSA (obstructive sleep apnea)   . Thrombophlebitis of deep vein of leg   . DM (diabetes mellitus)   . CHF (congestive heart failure)     hx of EF 15-20% in 4/08,  echo 3/09 EF 60-65%;   echo 6/12: EF 55-60%, mild LAE  . Carotid stenosis     u/s 6/10: R 0-39%Ll 40-59%;  u/s 7/12: 40-59% bilateral (repeat in 7/13)  . HTN (hypertension)   . Morbid obesity   . Pulmonary HTN     mild,  cath 6/08 with PVR 5.1  . Hypokalemia   . 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 Spinal stenosis   . Obesity hypoventilation syndrome   . Cancer     hx of colon CA 2000  . Arthritis     Past Surgical History  Procedure Laterality Date  . Carpal tunnel release  07/2007  . Colon cancer with resection  2000  . Cholecystectomy  2002  . Vaginal hysterectomy  1983  . Eye surgery      bilateral cataracts     Family History  Problem Relation Age of Onset  . Coronary artery disease Father    Social History:  reports that she has never smoked. She has never used smokeless tobacco. She reports that she does not drink alcohol or use illicit drugs.  Allergies:  Allergies  Allergen Reactions  . Codeine Other (See Comments)    Causes confusion  . Meperidine Hcl Other (See Comments)    Increase in heart rate  . Metformin Nausea And Vomiting    No prescriptions prior to admission    Results for orders placed during the hospital encounter of 10/05/12 (from the past 48 hour(s))  APTT     Status: None   Collection Time    10/05/12  2:48 PM      Result Value Range   aPTT 27  24 - 37 seconds  CBC     Status: Abnormal   Collection Time    10/05/12  2:48 PM      Result Value Range   WBC 13.6 (*) 4.0 - 10.5 K/uL    RBC 3.76 (*) 3.87 - 5.11 MIL/uL   Hemoglobin 10.5 (*) 12.0 - 15.0 g/dL   HCT 16.1 (*) 09.6 - 04.5 %   MCV 88.6  78.0 - 100.0 fL   MCH 27.9  26.0 - 34.0 pg   MCHC 31.5  30.0 - 36.0 g/dL   RDW 40.9  81.1 - 91.4 %   Platelets 214  150 - 400 K/uL  COMPREHENSIVE METABOLIC PANEL     Status: Abnormal   Collection Time    10/05/12  2:48 PM      Result Value Range   Sodium 141  135 - 145 mEq/L   Potassium 3.6  3.5 - 5.1 mEq/L   Chloride 101  96 - 112 mEq/L   CO2 31  19 - 32 mEq/L   Glucose, Bld 195 (*) 70 - 99 mg/dL   BUN 37 (*) 6 - 23 mg/dL   Creatinine, Ser 7.82 (*) 0.50 - 1.10 mg/dL   Calcium 9.5  8.4 - 95.6 mg/dL   Total Protein  7.0  6.0 - 8.3 g/dL   Albumin 3.1 (*) 3.5 - 5.2 g/dL   AST 17  0 - 37 U/L   ALT 28  0 - 35 U/L   Alkaline Phosphatase 81  39 - 117 U/L   Total Bilirubin 0.2 (*) 0.3 - 1.2 mg/dL   GFR calc non Af Amer 40 (*) >90 mL/min   GFR calc Af Amer 46 (*) >90 mL/min   Comment:            The eGFR has been calculated     using the CKD EPI equation.     This calculation has not been     validated in all clinical     situations.     eGFR's persistently     <90 mL/min signify     possible Chronic Kidney Disease.  PROTIME-INR     Status: None   Collection Time    10/05/12  2:48 PM      Result Value Range   Prothrombin Time 14.9  11.6 - 15.2 seconds   INR 1.19  0.00 - 1.49  SURGICAL PCR SCREEN     Status: None   Collection Time    10/05/12  2:49 PM      Result Value Range   MRSA, PCR NEGATIVE  NEGATIVE   Staphylococcus aureus NEGATIVE  NEGATIVE   Comment:            The Xpert SA Assay (FDA     approved for NASAL specimens     in patients over 37 years of age),     is one component of     a comprehensive surveillance     program.  Test performance has     been validated by [employer] for patients greater     than or equal to 58 year old.     It is not intended     to diagnose infection nor to     guide or monitor treatment.   No results  found.  Review of Systems  All other systems reviewed and are negative.    There were no vitals taken for this visit. Physical Exam  Patient does have palpable pulses she is cellulitis osteomyelitis ulceration Wagner grade 3 ulceration left foot great toe Assessment/Plan Assessment: Ulceration osteomyelitis cellulitis left foot great toe.  Plan: We'll plan for left foot first ray amputation. Risks and benefits were discussed including infection neurovascular injury nonhealing of the wound need for additional surgery. Patient states she understands and wished to proceed at this time.  [name] 10/07/2012, 7:21 AM

## 2012-10-10 ENCOUNTER — Encounter (HOSPITAL_COMMUNITY): Payer: Medicare Other

## 2012-10-10 ENCOUNTER — Encounter (HOSPITAL_COMMUNITY): Payer: Self-pay | Admitting: Orthopedic Surgery

## 2012-10-11 DIAGNOSIS — I5032 Chronic diastolic (congestive) heart failure: Secondary | ICD-10-CM | POA: Diagnosis not present

## 2012-10-11 DIAGNOSIS — E119 Type 2 diabetes mellitus without complications: Secondary | ICD-10-CM | POA: Diagnosis not present

## 2012-10-11 DIAGNOSIS — M159 Polyosteoarthritis, unspecified: Secondary | ICD-10-CM | POA: Diagnosis not present

## 2012-10-11 DIAGNOSIS — I509 Heart failure, unspecified: Secondary | ICD-10-CM | POA: Diagnosis not present

## 2012-10-11 DIAGNOSIS — I10 Essential (primary) hypertension: Secondary | ICD-10-CM | POA: Diagnosis not present

## 2012-10-11 DIAGNOSIS — G4733 Obstructive sleep apnea (adult) (pediatric): Secondary | ICD-10-CM | POA: Diagnosis not present

## 2012-10-12 ENCOUNTER — Encounter: Payer: Self-pay | Admitting: Internal Medicine

## 2012-10-21 ENCOUNTER — Encounter (HOSPITAL_COMMUNITY): Payer: Self-pay | Admitting: Family Medicine

## 2012-10-21 ENCOUNTER — Emergency Department (HOSPITAL_COMMUNITY)
Admission: EM | Admit: 2012-10-21 | Discharge: 2012-10-21 | Disposition: A | Discharge status: Home / Self Care | Payer: Medicare Other | Attending: Emergency Medicine | Admitting: Emergency Medicine

## 2012-10-21 DIAGNOSIS — I5032 Chronic diastolic (congestive) heart failure: Secondary | ICD-10-CM | POA: Diagnosis not present

## 2012-10-21 DIAGNOSIS — G4733 Obstructive sleep apnea (adult) (pediatric): Secondary | ICD-10-CM | POA: Insufficient documentation

## 2012-10-21 DIAGNOSIS — Z7982 Long term (current) use of aspirin: Secondary | ICD-10-CM | POA: Insufficient documentation

## 2012-10-21 DIAGNOSIS — I509 Heart failure, unspecified: Secondary | ICD-10-CM | POA: Diagnosis not present

## 2012-10-21 DIAGNOSIS — Z794 Long term (current) use of insulin: Secondary | ICD-10-CM | POA: Diagnosis not present

## 2012-10-21 DIAGNOSIS — T8131XA Disruption of external operation (surgical) wound, not elsewhere classified, initial encounter: Secondary | ICD-10-CM | POA: Diagnosis not present

## 2012-10-21 DIAGNOSIS — Y838 Other surgical procedures as the cause of abnormal reaction of the patient, or of later complication, without mention of misadventure at the time of the procedure: Secondary | ICD-10-CM | POA: Insufficient documentation

## 2012-10-21 DIAGNOSIS — Z85038 Personal history of other malignant neoplasm of large intestine: Secondary | ICD-10-CM | POA: Diagnosis not present

## 2012-10-21 DIAGNOSIS — I10 Essential (primary) hypertension: Secondary | ICD-10-CM | POA: Diagnosis not present

## 2012-10-21 DIAGNOSIS — E119 Type 2 diabetes mellitus without complications: Secondary | ICD-10-CM | POA: Diagnosis not present

## 2012-10-21 DIAGNOSIS — Z8679 Personal history of other diseases of the circulatory system: Secondary | ICD-10-CM | POA: Diagnosis not present

## 2012-10-21 DIAGNOSIS — S98119A Complete traumatic amputation of unspecified great toe, initial encounter: Secondary | ICD-10-CM | POA: Diagnosis not present

## 2012-10-21 DIAGNOSIS — Z8739 Personal history of other diseases of the musculoskeletal system and connective tissue: Secondary | ICD-10-CM | POA: Insufficient documentation

## 2012-10-21 DIAGNOSIS — Z79899 Other long term (current) drug therapy: Secondary | ICD-10-CM | POA: Diagnosis not present

## 2012-10-21 DIAGNOSIS — Z86718 Personal history of other venous thrombosis and embolism: Secondary | ICD-10-CM | POA: Diagnosis not present

## 2012-10-21 DIAGNOSIS — E662 Morbid (severe) obesity with alveolar hypoventilation: Secondary | ICD-10-CM | POA: Insufficient documentation

## 2012-10-21 DIAGNOSIS — T819XXA Unspecified complication of procedure, initial encounter: Secondary | ICD-10-CM | POA: Diagnosis not present

## 2012-10-21 DIAGNOSIS — M159 Polyosteoarthritis, unspecified: Secondary | ICD-10-CM | POA: Diagnosis not present

## 2012-10-21 LAB — PROTIME-INR: Prothrombin Time: 16.2 seconds — ABNORMAL HIGH (ref 11.6–15.2)

## 2012-10-21 LAB — HEMOGLOBIN AND HEMATOCRIT, BLOOD: HCT: 27.3 % — ABNORMAL LOW (ref 36.0–46.0)

## 2012-10-21 NOTE — ED Notes (Signed)
Dr. Lajoyce Corners called checking on his patient.  Explained that her wound staretd bleeding at home aAnd they called EMS.  EMS stated that the bleeding stopped by the time they arrived.  Upon arrival here Dr. Jeraldine Loots  Applied steri strips and kerlix.  Dr. Lajoyce Corners OK with that.

## 2012-10-21 NOTE — ED Notes (Signed)
Pt has incision to left big toe from recent amputation. Bleeding controlled. Site clean and dry. Pt pulses present and good feeling in foot.

## 2012-10-21 NOTE — ED Notes (Signed)
Per EMS, pt recent toe amputation and had stiches removed today. sts this afternoon started bleeding. sts by the time they got there it was clotted off. Pt foot wrapped upon arrival. Bleeding controlled. VS WDL.

## 2012-10-21 NOTE — ED Provider Notes (Signed)
History     CSN: [id number]  Arrival date & time 10/21/12  1846   First MD Initiated Contact with Patient 10/21/12 1849      Chief Complaint  Patient presents with  . Coagulation Disorder    (Consider location/radiation/quality/duration/timing/severity/associated sxs/prior treatment) HPI Patient presents with concern over a bleeding left foot.  She had left great toe amputation 2 weeks ago secondary to nonhealing diabetic ulcer.  She states the procedure was well tolerated, with no complications.  Following the procedure the patient was generally well, with no new events.  Today, early in the day the patient had her sutures removed.  She subsequently home, and soon thereafter developed bleeding from the wound site.  No pain, no dysesthesia, no other complaints, such as lightheadedness, syncope, dyspnea. Bleeding stopped prior to arrival with pressure.  Past Medical History  Diagnosis Date  . OSA (obstructive sleep apnea)   . Thrombophlebitis of deep vein of leg   . DM (diabetes mellitus)   . CHF (congestive heart failure)     hx of EF 15-20% in 4/08,  echo 3/09 EF 60-65%;   echo 6/12: EF 55-60%, mild LAE  . Carotid stenosis     u/s 6/10: R 0-39%Ll 40-59%;  u/s 7/12: 40-59% bilateral (repeat in 7/13)  . HTN (hypertension)   . Morbid obesity   . Pulmonary HTN     mild,  cath 6/08 with PVR 5.1  . Hypokalemia   . 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 Spinal stenosis   . Obesity hypoventilation syndrome   . Cancer     hx of colon CA 2000  . Arthritis     Past Surgical History  Procedure Laterality Date  . Carpal tunnel release  07/2007  . Colon cancer with resection  2000  . Cholecystectomy  2002  . Vaginal hysterectomy  1983  . Eye surgery      bilateral cataracts   . Amputation Left 10/07/2012    Procedure: AMPUTATION RAY;  Surgeon: Nadara Mustard, MD;  Location: [facility] OR;  Service: Orthopedics;  Laterality: Left;  Left foot 1st Ray Amputation     Family History  Problem Relation Age of Onset  . Coronary artery disease Father     History  Substance Use Topics  . Smoking status: Never Smoker   . Smokeless tobacco: Never Used  . Alcohol Use: No    OB History   Grav Para Term Preterm Abortions TAB SAB Ect Mult Living                  Review of Systems  All other systems reviewed and are negative.    Allergies  Codeine; Meperidine hcl; and Metformin  Home Medications   Current Outpatient Rx  Name  Route  Sig  Dispense  Refill  . allopurinol (ZYLOPRIM) 300 MG tablet   Oral   Take 300 mg by mouth daily.         Marland Kitchen aspirin (BAYER ASPIRIN) 325 MG tablet   Oral   Take 325 mg by mouth daily.           . carvedilol (COREG) 3.125 MG tablet   Oral   Take 3.125 mg by mouth 2 (two) times daily with a meal.          . cyanocobalamin (,VITAMIN B-12,) 1000 MCG/ML injection   Intramuscular   Inject 1,000 mcg into the muscle every 30 (thirty) days.          Marland Kitchen  HYDROcodone-acetaminophen (NORCO) 7.5-325 MG per tablet   Oral   Take 1 tablet by mouth every 4 (four) hours as needed for pain.   60 tablet   0   . insulin glargine (LANTUS) 100 UNIT/ML injection   Subcutaneous   Inject 42-80 Units into the skin 2 (two) times daily. Pt uses 42 units in AM and 80 units in PM         . insulin glulisine (APIDRA) 100 UNIT/ML injection   Subcutaneous   Inject 5-15 Units into the skin 3 (three) times daily before meals. Sliding scale         . LORazepam (ATIVAN) 1 MG tablet   Oral   Take 1 mg by mouth 2 (two) times daily.           . sertraline (ZOLOFT) 100 MG tablet   Oral   Take 50 mg by mouth daily.          . simvastatin (ZOCOR) 40 MG tablet   Oral   Take 1 tablet (40 mg total) by mouth at bedtime.   90 tablet   3   . torsemide (DEMADEX) 20 MG tablet   Oral   Take 2 tablets (40 mg total) by mouth daily.   60 tablet   3   . Vitamin D, Ergocalciferol, (DRISDOL) 50000 UNITS CAPS   Oral   Take  50,000 Units by mouth 2 (two) times a week. Take on Mon and Thurs.           BP 183/79  Pulse 87  Temp(Src) 97.1 F (36.2 C)  Resp 18  SpO2 96%  Physical Exam  Nursing note and vitals reviewed. Constitutional: She is oriented to person, place, and time. She appears well-developed and well-nourished. No distress.  HENT:  Head: Normocephalic and atraumatic.  Eyes: Conjunctivae and EOM are normal.  Cardiovascular: Normal rate and regular rhythm.   Pulmonary/Chest: Effort normal and breath sounds normal. No stridor. No respiratory distress.  Abdominal: She exhibits no distension.  Musculoskeletal: She exhibits no edema.  The left great toe is amputated with the surgical wound from the distal medial foot running roughly posteriorly and cephalad approximately 4 inches with a separated proximal area with minor bleeding. Capillary refill and the remainder of the foot, and pulses are appropriate. Ankle is fine.   Neurological: She is alert and oriented to person, place, and time. No cranial nerve deficit.  Skin: Skin is warm and dry.  Psychiatric: She has a normal mood and affect.    ED Course  Wound repair Date/Time: 10/21/2012 7:17 PM Performed by: Gerhard Munch Authorized by: Gerhard Munch Consent: Verbal consent obtained. Risks and benefits: risks, benefits and alternatives were discussed Consent given by: patient Patient understanding: patient states understanding of the procedure being performed Patient consent: the patient's understanding of the procedure matches consent given Procedure consent: procedure consent matches procedure scheduled Relevant documents: relevant documents present and verified Test results: test results available and properly labeled Site marked: the operative site was marked Imaging studies: imaging studies available Required items: required blood products, implants, devices, and special equipment available Patient identity confirmed: verbally  with patient Time out: Immediately prior to procedure a "time out" was called to verify the correct patient, procedure, equipment, support staff and site/side marked as required. Preparation: Patient was prepped and draped in the usual sterile fashion. Local anesthesia used: no Patient sedated: no Patient tolerance: Patient tolerated the procedure well with no immediate complications. Comments: Following lavage of the  wound, Steri-Strips, 4 were applied to approximate the separating surgical site.  The wound was subsequently dressed with gauze, Kerlix, no complications, procedure well tolerated.   (including critical care time)  Labs Reviewed  PROTIME-INR  HEMOGLOBIN AND HEMATOCRIT, BLOOD   No results found.   No diagnosis found.  Update: Patient ambulating to the bathroom, with no new bleeding.  MDM  Patient presents with bleeding from a surgical site.  On exam the patient is in no distress, hemodynamically stable without other complaints.  Following cleaning, the wound was approximated with Steri-Strips.  Absent distress, with reassuring labs, the patient was discharged to follow up with orthopedist. She was made aware of the mild anemia, though withbloodless, this is unlikely due to today's presentation.       Gerhard Munch, MD 10/21/12 2000

## 2012-10-21 NOTE — ED Notes (Signed)
Discussed following up with Dr.Duda ASAP or Monday morning if possible.

## 2012-11-07 DIAGNOSIS — I83219 Varicose veins of right lower extremity with both ulcer of unspecified site and inflammation: Secondary | ICD-10-CM | POA: Diagnosis not present

## 2012-11-07 DIAGNOSIS — I70269 Atherosclerosis of native arteries of extremities with gangrene, unspecified extremity: Secondary | ICD-10-CM | POA: Diagnosis not present

## 2012-11-07 DIAGNOSIS — L97929 Non-pressure chronic ulcer of unspecified part of left lower leg with unspecified severity: Secondary | ICD-10-CM | POA: Diagnosis not present

## 2012-11-07 DIAGNOSIS — E1149 Type 2 diabetes mellitus with other diabetic neurological complication: Secondary | ICD-10-CM | POA: Diagnosis not present

## 2012-11-11 ENCOUNTER — Other Ambulatory Visit: Payer: Self-pay

## 2012-11-11 MED ORDER — SIMVASTATIN 40 MG PO TABS: 40.0000 mg | ORAL_TABLET | Freq: Every day | ORAL | Status: DC

## 2012-11-16 DIAGNOSIS — E1149 Type 2 diabetes mellitus with other diabetic neurological complication: Secondary | ICD-10-CM | POA: Diagnosis not present

## 2012-11-16 DIAGNOSIS — I70269 Atherosclerosis of native arteries of extremities with gangrene, unspecified extremity: Secondary | ICD-10-CM | POA: Diagnosis not present

## 2012-11-17 DIAGNOSIS — I10 Essential (primary) hypertension: Secondary | ICD-10-CM | POA: Diagnosis not present

## 2012-11-17 DIAGNOSIS — D518 Other vitamin B12 deficiency anemias: Secondary | ICD-10-CM | POA: Diagnosis not present

## 2012-11-17 DIAGNOSIS — E109 Type 1 diabetes mellitus without complications: Secondary | ICD-10-CM | POA: Diagnosis not present

## 2012-11-17 DIAGNOSIS — D539 Nutritional anemia, unspecified: Secondary | ICD-10-CM | POA: Diagnosis not present

## 2012-11-22 DIAGNOSIS — E109 Type 1 diabetes mellitus without complications: Secondary | ICD-10-CM | POA: Diagnosis not present

## 2012-11-22 DIAGNOSIS — D539 Nutritional anemia, unspecified: Secondary | ICD-10-CM | POA: Diagnosis not present

## 2012-11-28 DIAGNOSIS — L97929 Non-pressure chronic ulcer of unspecified part of left lower leg with unspecified severity: Secondary | ICD-10-CM | POA: Diagnosis not present

## 2012-11-28 DIAGNOSIS — I83219 Varicose veins of right lower extremity with both ulcer of unspecified site and inflammation: Secondary | ICD-10-CM | POA: Diagnosis not present

## 2012-11-28 DIAGNOSIS — E1149 Type 2 diabetes mellitus with other diabetic neurological complication: Secondary | ICD-10-CM | POA: Diagnosis not present

## 2012-11-28 DIAGNOSIS — I70269 Atherosclerosis of native arteries of extremities with gangrene, unspecified extremity: Secondary | ICD-10-CM | POA: Diagnosis not present

## 2012-11-28 DIAGNOSIS — L97919 Non-pressure chronic ulcer of unspecified part of right lower leg with unspecified severity: Secondary | ICD-10-CM | POA: Diagnosis not present

## 2012-12-05 DIAGNOSIS — I70269 Atherosclerosis of native arteries of extremities with gangrene, unspecified extremity: Secondary | ICD-10-CM | POA: Diagnosis not present

## 2012-12-05 DIAGNOSIS — L97409 Non-pressure chronic ulcer of unspecified heel and midfoot with unspecified severity: Secondary | ICD-10-CM | POA: Diagnosis not present

## 2012-12-05 DIAGNOSIS — E1149 Type 2 diabetes mellitus with other diabetic neurological complication: Secondary | ICD-10-CM | POA: Diagnosis not present

## 2012-12-05 DIAGNOSIS — I83219 Varicose veins of right lower extremity with both ulcer of unspecified site and inflammation: Secondary | ICD-10-CM | POA: Diagnosis not present

## 2012-12-05 DIAGNOSIS — L97929 Non-pressure chronic ulcer of unspecified part of left lower leg with unspecified severity: Secondary | ICD-10-CM | POA: Diagnosis not present

## 2012-12-13 DIAGNOSIS — L97919 Non-pressure chronic ulcer of unspecified part of right lower leg with unspecified severity: Secondary | ICD-10-CM | POA: Diagnosis not present

## 2012-12-13 DIAGNOSIS — L97409 Non-pressure chronic ulcer of unspecified heel and midfoot with unspecified severity: Secondary | ICD-10-CM | POA: Diagnosis not present

## 2012-12-13 DIAGNOSIS — I83229 Varicose veins of left lower extremity with both ulcer of unspecified site and inflammation: Secondary | ICD-10-CM | POA: Diagnosis not present

## 2012-12-13 DIAGNOSIS — E1149 Type 2 diabetes mellitus with other diabetic neurological complication: Secondary | ICD-10-CM | POA: Diagnosis not present

## 2012-12-13 DIAGNOSIS — I70269 Atherosclerosis of native arteries of extremities with gangrene, unspecified extremity: Secondary | ICD-10-CM | POA: Diagnosis not present

## 2012-12-15 ENCOUNTER — Telehealth (HOSPITAL_COMMUNITY): Payer: Self-pay | Admitting: *Deleted

## 2012-12-15 ENCOUNTER — Encounter (HOSPITAL_COMMUNITY): Payer: Self-pay

## 2012-12-15 ENCOUNTER — Ambulatory Visit (HOSPITAL_COMMUNITY)
Admission: RE | Admit: 2012-12-15 | Discharge: 2012-12-15 | Disposition: A | Discharge status: Home / Self Care | Payer: Medicare Other | Source: Ambulatory Visit | Attending: Internal Medicine | Admitting: Internal Medicine

## 2012-12-15 VITALS — BP 130/58 | HR 73 | Wt 280.1 lb

## 2012-12-15 DIAGNOSIS — I5033 Acute on chronic diastolic (congestive) heart failure: Secondary | ICD-10-CM | POA: Insufficient documentation

## 2012-12-15 DIAGNOSIS — I5032 Chronic diastolic (congestive) heart failure: Secondary | ICD-10-CM

## 2012-12-15 DIAGNOSIS — I2789 Other specified pulmonary heart diseases: Secondary | ICD-10-CM | POA: Diagnosis not present

## 2012-12-15 DIAGNOSIS — M109 Gout, unspecified: Secondary | ICD-10-CM

## 2012-12-15 DIAGNOSIS — I10 Essential (primary) hypertension: Secondary | ICD-10-CM | POA: Diagnosis not present

## 2012-12-15 DIAGNOSIS — E119 Type 2 diabetes mellitus without complications: Secondary | ICD-10-CM | POA: Insufficient documentation

## 2012-12-15 LAB — BASIC METABOLIC PANEL
CO2: 28 mEq/L (ref 19–32)
Chloride: 103 mEq/L (ref 96–112)
Creatinine, Ser: 0.96 mg/dL (ref 0.50–1.10)
Glucose, Bld: 104 mg/dL — ABNORMAL HIGH (ref 70–99)

## 2012-12-15 MED ORDER — POTASSIUM CHLORIDE CRYS ER 20 MEQ PO TBCR: 20.0000 meq | EXTENDED_RELEASE_TABLET | Freq: Every day | ORAL | Status: DC

## 2012-12-15 MED ORDER — PREDNISONE (PAK) 10 MG PO TABS: 40.0000 mg | ORAL_TABLET | Freq: Every day | ORAL | Status: DC

## 2012-12-15 MED ORDER — TORSEMIDE 20 MG PO TABS: ORAL_TABLET | ORAL | Status: DC

## 2012-12-15 NOTE — Telephone Encounter (Signed)
Message copied by Noralee Space on Thu Dec 15, 2012  4:21 PM ------      Message from: [place], Maral D      Created: Thu Dec 15, 2012 12:37 PM       Take 20 meq KDur daily. Please repeat BMET next week ------

## 2012-12-15 NOTE — Assessment & Plan Note (Signed)
Continue allopurinol and give prednisone 40 mg daily for 3 days.

## 2012-12-15 NOTE — Assessment & Plan Note (Signed)
Volume status mildly elevated. Continue Torsemide 40 mg in am and add 20 mg in pm. She will not be placed on spironolactone due to hyperkalemia. Check BMET today. Reinforced limiting fluid to < 2 liters as she is drinking > 2 liters per day. Follow up in 1 month to reassess volume status.

## 2012-12-15 NOTE — Patient Instructions (Addendum)
Take Torsemide 40 mg in am and 20 mg in pm  Take 40 mg of prednisone daily for 3 days  Do the following things EVERYDAY: 1) Weigh yourself in the morning before breakfast. Write it down and keep it in a log. 2) Take your medicines as prescribed 3) Eat low salt foods-Limit salt (sodium) to 2000 mg per day.  4) Stay as active as you can everyday 5) Limit all fluids for the day to less than 2 liters  Follow up in 1 month

## 2012-12-15 NOTE — Progress Notes (Signed)
Patient ID: [name], female   DOB: December 20, 1945, 67 y.o.   MRN: [id number] Primary Cardiologist:  Dr. Arvilla Meres PCP: Dr. Clarene Duke at [facility]  HPI:  [name] is a 67 y.o. female with a history of morbid obesity complicated by obesity hypoventilation syndrome, hypertension, sleep apnea, diabetes, CRI and mild secondary pulmonary hypertension as well as hypokalemia. Carotid stenosis 40-59% bilaterally.  Initially, she had an echocardiogram read as severe LV dysfunction with an EF of 15-20%. She underwent catheterization in 2008 that showed minimal nonobstructive coronary artery disease with EF 55%. She had mild pulmonary hypertension with both pulmonary venous and pulmonary arterial components. Her PVR was 5.1 Wood units. Echo in 3/09 EF 60%.  Saw Dr. Antoine Poche in 8/11 over question of possible AF. ECG with PACs. 24 monitor NSR with occ PACs and PVCS. No AF.    She was hospitalized at the end of August 2012 for acute on chronic diastolic heart failure.  Diuresed ~17 lbs with discharge weight of 282 lbs.  Switched from ACE-I to ARB due to cough.  She was admitted 04/21/12-04/28/12 for acute on chronic diastolic HF and 25 pound weight gain.  She diuresed with IV lasix and discharge weight was 269 pounds.   Admitted to [facility] due to hyperkalemia and acute renal failure. This was thought to be from Bactroban and spironolactone. Evaluated by Dr Enzo Montgomery. Recommendations to stop spironolactone.   Discharged from [facility] October 21, 2012  after amputations of L great toe.   She returns today for follow up today.Unable to weight home due to balance issues. Complains of L index finger pain.  Denies SOB/PND. + Orthopnea sleeping on 2 pillows. Ambulates with a walker.Compliant with medications. Followed by Dr Lajoyce Corners weekly.  She is not spironolactone due to acute renal failure and hyperkalemia.      ROS: All systems negative except as listed in HPI, PMH and Problem List.  Past Medical  History  Diagnosis Date  . OSA (obstructive sleep apnea)   . Thrombophlebitis of deep vein of leg   . DM (diabetes mellitus)   . CHF (congestive heart failure)     hx of EF 15-20% in 4/08,  echo 3/09 EF 60-65%;   echo 6/12: EF 55-60%, mild LAE  . Carotid stenosis     u/s 6/10: R 0-39%Ll 40-59%;  u/s 7/12: 40-59% bilateral (repeat in 7/13)  . HTN (hypertension)   . Morbid obesity   . Pulmonary HTN     mild,  cath 6/08 with PVR 5.1  . Hypokalemia   . 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 Spinal stenosis   . Obesity hypoventilation syndrome   . Cancer     hx of colon CA 2000  . Arthritis      Current Outpatient Prescriptions on File Prior to Encounter  Medication Sig Dispense Refill  . allopurinol (ZYLOPRIM) 300 MG tablet Take 300 mg by mouth daily.      Marland Kitchen aspirin (BAYER ASPIRIN) 325 MG tablet Take 325 mg by mouth daily.        . carvedilol (COREG) 3.125 MG tablet Take 3.125 mg by mouth 2 (two) times daily with a meal.       . cyanocobalamin (,VITAMIN B-12,) 1000 MCG/ML injection Inject 1,000 mcg into the muscle every 30 (thirty) days.       . insulin glargine (LANTUS) 100 UNIT/ML injection Inject 42-80 Units into the skin 2 (two) times  daily. Pt uses 42 units in AM and 80 units in PM      . insulin glulisine (APIDRA) 100 UNIT/ML injection Inject 5-15 Units into the skin 3 (three) times daily before meals. Sliding scale      . LORazepam (ATIVAN) 1 MG tablet Take 1 mg by mouth 2 (two) times daily.        . sertraline (ZOLOFT) 100 MG tablet Take 50 mg by mouth daily.       . simvastatin (ZOCOR) 40 MG tablet Take 1 tablet (40 mg total) by mouth at bedtime.  90 tablet  2  . torsemide (DEMADEX) 20 MG tablet Take 2 tablets (40 mg total) by mouth daily.  60 tablet  3  . Vitamin D, Ergocalciferol, (DRISDOL) 50000 UNITS CAPS Take 50,000 Units by mouth 2 (two) times a week. Take on Mon and Thurs.       No current facility-administered medications on file prior to  encounter.   Allergies  Allergen Reactions  . Codeine Other (See Comments)    Causes confusion  . Meperidine Hcl Other (See Comments)    Increase in heart rate  . Metformin Nausea And Vomiting     PHYSICAL EXAM: Filed Vitals:   12/15/12 1043  BP: 130/58  Pulse: 73  Weight: 280 lb 1.9 oz (127.062 kg)  SpO2: 98%   Weight: 275>285> 269>280 Pounds General: Elderly chronically ill appearing wheelchair. No resp difficulty Sister present HEENT: normal Neck: supple. JVP 8-9 difficult to tell due to body habitus.   Carotids 2+ bilaterally; no bruits. No lymphadenopathy or thryomegaly appreciated. Cor: PMI normal. Regular rate & rhythm. No rubs, gallops or murmurs. Lungs: clear Abdomen: obese,  nontender, mildly distended. No hepatosplenomegaly. No bruits or masses. Good bowel sounds. Extremities:  No cyanosis or clubbing.  LLE coban dressing. RLE ted hose. R and LLE 3+ edema. L hand index finger erythema.  Neuro: alert & orientedx3, cranial nerves grossly intact. Moves all 4 extremities w/o difficulty. Affect pleasant.     ASSESSMENT & PLAN:

## 2012-12-19 DIAGNOSIS — E1149 Type 2 diabetes mellitus with other diabetic neurological complication: Secondary | ICD-10-CM | POA: Diagnosis not present

## 2012-12-19 DIAGNOSIS — I70269 Atherosclerosis of native arteries of extremities with gangrene, unspecified extremity: Secondary | ICD-10-CM | POA: Diagnosis not present

## 2012-12-19 DIAGNOSIS — I83219 Varicose veins of right lower extremity with both ulcer of unspecified site and inflammation: Secondary | ICD-10-CM | POA: Diagnosis not present

## 2012-12-22 ENCOUNTER — Other Ambulatory Visit (INDEPENDENT_AMBULATORY_CARE_PROVIDER_SITE_OTHER): Payer: Medicare Other

## 2012-12-22 DIAGNOSIS — Z961 Presence of intraocular lens: Secondary | ICD-10-CM | POA: Diagnosis not present

## 2012-12-22 DIAGNOSIS — I5032 Chronic diastolic (congestive) heart failure: Secondary | ICD-10-CM | POA: Diagnosis not present

## 2012-12-22 DIAGNOSIS — E109 Type 1 diabetes mellitus without complications: Secondary | ICD-10-CM | POA: Diagnosis not present

## 2012-12-22 LAB — BASIC METABOLIC PANEL
BUN: 30 mg/dL — ABNORMAL HIGH (ref 6–23)
CO2: 30 mEq/L (ref 19–32)
Calcium: 8.8 mg/dL (ref 8.4–10.5)
Glucose, Bld: 100 mg/dL — ABNORMAL HIGH (ref 70–99)
Potassium: 3.4 mEq/L — ABNORMAL LOW (ref 3.5–5.1)
Sodium: 144 mEq/L (ref 135–145)

## 2012-12-26 ENCOUNTER — Other Ambulatory Visit (HOSPITAL_COMMUNITY): Payer: Self-pay | Admitting: Orthopedic Surgery

## 2012-12-26 ENCOUNTER — Ambulatory Visit (HOSPITAL_COMMUNITY)
Admission: RE | Admit: 2012-12-26 | Discharge: 2012-12-26 | Disposition: A | Discharge status: Home / Self Care | Payer: Medicare Other | Source: Ambulatory Visit | Attending: Cardiovascular Disease | Admitting: Cardiovascular Disease

## 2012-12-26 DIAGNOSIS — M7989 Other specified soft tissue disorders: Secondary | ICD-10-CM | POA: Insufficient documentation

## 2012-12-26 DIAGNOSIS — E1149 Type 2 diabetes mellitus with other diabetic neurological complication: Secondary | ICD-10-CM | POA: Diagnosis not present

## 2012-12-26 DIAGNOSIS — L97929 Non-pressure chronic ulcer of unspecified part of left lower leg with unspecified severity: Secondary | ICD-10-CM | POA: Diagnosis not present

## 2012-12-26 DIAGNOSIS — R609 Edema, unspecified: Secondary | ICD-10-CM

## 2012-12-26 DIAGNOSIS — E1169 Type 2 diabetes mellitus with other specified complication: Secondary | ICD-10-CM | POA: Insufficient documentation

## 2012-12-26 DIAGNOSIS — M79609 Pain in unspecified limb: Secondary | ICD-10-CM | POA: Diagnosis not present

## 2012-12-26 DIAGNOSIS — IMO0002 Reserved for concepts with insufficient information to code with codable children: Secondary | ICD-10-CM | POA: Diagnosis not present

## 2012-12-26 DIAGNOSIS — R5383 Other fatigue: Secondary | ICD-10-CM | POA: Diagnosis not present

## 2012-12-26 DIAGNOSIS — I872 Venous insufficiency (chronic) (peripheral): Secondary | ICD-10-CM | POA: Diagnosis not present

## 2012-12-26 DIAGNOSIS — O0335 Other venous complications following incomplete spontaneous abortion: Secondary | ICD-10-CM

## 2012-12-26 DIAGNOSIS — M25569 Pain in unspecified knee: Secondary | ICD-10-CM | POA: Diagnosis not present

## 2012-12-26 DIAGNOSIS — L97309 Non-pressure chronic ulcer of unspecified ankle with unspecified severity: Secondary | ICD-10-CM | POA: Insufficient documentation

## 2012-12-26 DIAGNOSIS — L97509 Non-pressure chronic ulcer of other part of unspecified foot with unspecified severity: Secondary | ICD-10-CM | POA: Diagnosis not present

## 2012-12-26 DIAGNOSIS — I83219 Varicose veins of right lower extremity with both ulcer of unspecified site and inflammation: Secondary | ICD-10-CM | POA: Diagnosis not present

## 2012-12-26 DIAGNOSIS — L97311 Non-pressure chronic ulcer of right ankle limited to breakdown of skin: Secondary | ICD-10-CM

## 2012-12-26 DIAGNOSIS — E1165 Type 2 diabetes mellitus with hyperglycemia: Secondary | ICD-10-CM | POA: Diagnosis not present

## 2012-12-26 DIAGNOSIS — M2550 Pain in unspecified joint: Secondary | ICD-10-CM | POA: Diagnosis not present

## 2012-12-26 NOTE — Progress Notes (Signed)
Lower Ext. Venous Completed. Marilynne Halsted, RDMS, RVT

## 2012-12-26 NOTE — Progress Notes (Signed)
Lower Ext. Arterial Duplex Completed. Grecia Lynk, RDMS, RVT  

## 2013-01-11 ENCOUNTER — Ambulatory Visit (HOSPITAL_COMMUNITY)
Admission: RE | Admit: 2013-01-11 | Discharge: 2013-01-11 | Disposition: A | Discharge status: Home / Self Care | Payer: Medicare Other | Source: Ambulatory Visit | Attending: Internal Medicine | Admitting: Internal Medicine

## 2013-01-11 VITALS — BP 112/56 | HR 75 | Wt 272.0 lb

## 2013-01-11 DIAGNOSIS — I129 Hypertensive chronic kidney disease with stage 1 through stage 4 chronic kidney disease, or unspecified chronic kidney disease: Secondary | ICD-10-CM | POA: Insufficient documentation

## 2013-01-11 DIAGNOSIS — I509 Heart failure, unspecified: Secondary | ICD-10-CM | POA: Diagnosis not present

## 2013-01-11 DIAGNOSIS — I658 Occlusion and stenosis of other precerebral arteries: Secondary | ICD-10-CM | POA: Insufficient documentation

## 2013-01-11 DIAGNOSIS — E119 Type 2 diabetes mellitus without complications: Secondary | ICD-10-CM | POA: Insufficient documentation

## 2013-01-11 DIAGNOSIS — Z794 Long term (current) use of insulin: Secondary | ICD-10-CM | POA: Insufficient documentation

## 2013-01-11 DIAGNOSIS — M129 Arthropathy, unspecified: Secondary | ICD-10-CM | POA: Diagnosis not present

## 2013-01-11 DIAGNOSIS — Z85038 Personal history of other malignant neoplasm of large intestine: Secondary | ICD-10-CM | POA: Diagnosis not present

## 2013-01-11 DIAGNOSIS — G4733 Obstructive sleep apnea (adult) (pediatric): Secondary | ICD-10-CM | POA: Diagnosis not present

## 2013-01-11 DIAGNOSIS — N189 Chronic kidney disease, unspecified: Secondary | ICD-10-CM | POA: Insufficient documentation

## 2013-01-11 DIAGNOSIS — I6529 Occlusion and stenosis of unspecified carotid artery: Secondary | ICD-10-CM | POA: Insufficient documentation

## 2013-01-11 DIAGNOSIS — E662 Morbid (severe) obesity with alveolar hypoventilation: Secondary | ICD-10-CM | POA: Insufficient documentation

## 2013-01-11 DIAGNOSIS — I5032 Chronic diastolic (congestive) heart failure: Secondary | ICD-10-CM | POA: Diagnosis not present

## 2013-01-11 DIAGNOSIS — Z79899 Other long term (current) drug therapy: Secondary | ICD-10-CM | POA: Insufficient documentation

## 2013-01-11 LAB — BASIC METABOLIC PANEL
BUN: 24 mg/dL — ABNORMAL HIGH (ref 6–23)
CO2: 33 mEq/L — ABNORMAL HIGH (ref 19–32)
Calcium: 9 mg/dL (ref 8.4–10.5)
Chloride: 97 mEq/L (ref 96–112)
Creatinine, Ser: 1.17 mg/dL — ABNORMAL HIGH (ref 0.50–1.10)

## 2013-01-11 MED ORDER — METOLAZONE 2.5 MG PO TABS: 2.5000 mg | ORAL_TABLET | ORAL | Status: DC | PRN

## 2013-01-11 MED ORDER — POTASSIUM CHLORIDE CRYS ER 20 MEQ PO TBCR: 20.0000 meq | EXTENDED_RELEASE_TABLET | Freq: Two times a day (BID) | ORAL | Status: DC

## 2013-01-11 NOTE — Progress Notes (Signed)
Patient ID: [name], female   DOB: Dec 19, 1945, 67 y.o.   MRN: [id number] Primary Cardiologist:  Dr. Arvilla Meres PCP: [facility]. Clarene [facility] at [facility]  HPI:  [name] is a 67 y.o. female with a history of morbid obesity complicated by obesity hypoventilation syndrome, hypertension, sleep apnea, diabetes, CRI and mild secondary pulmonary hypertension as well as hypokalemia. Carotid stenosis 40-59% bilaterally.    Initially, she had an echocardiogram read as severe LV dysfunction with an EF of 15-20%. She underwent catheterization in 2008 that showed minimal nonobstructive coronary artery disease with EF 55%. She had mild pulmonary hypertension with both pulmonary venous and pulmonary arterial components. Her PVR was 5.1 Wood units. Echo in 3/09 EF 60%.    She was hospitalized at the end of August 2012 for acute on chronic diastolic heart failure.  Diuresed ~17 lbs with discharge weight of 282 lbs.  Switched from ACE-I to ARB due to cough.  She was admitted 04/21/12-04/28/12 for acute on chronic diastolic HF and 25 pound weight gain.  She diuresed with IV lasix and discharge weight was 269 pounds.   Admitted to [facility] due to hyperkalemia and acute renal failure. This was thought to be from Bactroban and spironolactone. Evaluated by Dr Enzo Montgomery. Recommendations to stop spironolactone/ARB.    She returns today for follow up today. Complains of a cough. Denies SOB/PND.  + Orthopnea sleeps on 2 pillows. Ambulating with walker. Compliant with medications. Followed by Dr Lajoyce Corners every other week. She now has an aide 6  Days a week for 4 hours.  Tries to follow low salt diet but admits to eating pork and barbeque. She is not spironolactone/ARB due to acute renal failure and hyperkalemia.      ROS: All systems negative except as listed in HPI, PMH and Problem List.  Past Medical History  Diagnosis Date  . OSA (obstructive sleep apnea)   . Thrombophlebitis of deep vein of leg   . DM  (diabetes mellitus)   . CHF (congestive heart failure)     hx of EF 15-20% in 4/08,  echo 3/09 EF 60-65%;   echo 6/12: EF 55-60%, mild LAE  . Carotid stenosis     u/s 6/10: R 0-39%Ll 40-59%;  u/s 7/12: 40-59% bilateral (repeat in 7/13)  . HTN (hypertension)   . Morbid obesity   . Pulmonary HTN     mild,  cath 6/08 with PVR 5.1  . Hypokalemia   . 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 Spinal stenosis   . Obesity hypoventilation syndrome   . Cancer     hx of colon CA 2000  . Arthritis      Current Outpatient Prescriptions on File Prior to Encounter  Medication Sig Dispense Refill  . allopurinol (ZYLOPRIM) 300 MG tablet Take 300 mg by mouth daily.      Marland Kitchen aspirin (BAYER ASPIRIN) 325 MG tablet Take 325 mg by mouth daily.        . carvedilol (COREG) 3.125 MG tablet Take 3.125 mg by mouth 2 (two) times daily with a meal.       . cyanocobalamin (,VITAMIN B-12,) 1000 MCG/ML injection Inject 1,000 mcg into the muscle every 30 (thirty) days.       . insulin glargine (LANTUS) 100 UNIT/ML injection Inject 42-80 Units into the skin 2 (two) times daily. Pt uses 42 units in AM and 80 units in PM      .  insulin glulisine (APIDRA) 100 UNIT/ML injection Inject 5-15 Units into the skin 3 (three) times daily before meals. Sliding scale      . LORazepam (ATIVAN) 1 MG tablet Take 1 mg by mouth 2 (two) times daily.        . potassium chloride SA (K-DUR,KLOR-CON) 20 MEQ tablet Take 1 tablet (20 mEq total) by mouth daily.  30 tablet  6  . sertraline (ZOLOFT) 100 MG tablet Take 50 mg by mouth daily.       . simvastatin (ZOCOR) 40 MG tablet Take 1 tablet (40 mg total) by mouth at bedtime.  90 tablet  2  . Vitamin D, Ergocalciferol, (DRISDOL) 50000 UNITS CAPS Take 50,000 Units by mouth 2 (two) times a week. Take on Mon and Thurs.       No current facility-administered medications on file prior to encounter.   Allergies  Allergen Reactions  . Codeine Other (See Comments)    Causes  confusion  . Meperidine Hcl Other (See Comments)    Increase in heart rate  . Metformin Nausea And Vomiting     PHYSICAL EXAM: Filed Vitals:   01/11/13 1401  BP: 112/56  Pulse: 75  Weight: 272 lb (123.378 kg)  SpO2: 93%   Weight: 275>285> 269>280>272 Pounds General: Elderly chronically ill appearing wheelchair. No resp difficulty Sister and Mom present HEENT: normal Neck: supple. JVP difficult to tell due to body habitus but appears elevated.   Carotids 2+ bilaterally; no bruits. No lymphadenopathy or thryomegaly appreciated. Cor: PMI normal. Regular rate & rhythm. No rubs, gallops or murmurs. Lungs: clear Abdomen: obese,  nontender, mildly distended. No hepatosplenomegaly. No bruits or masses. Good bowel sounds. Extremities:  No cyanosis or clubbing. Rand LLE 2+ edema.  R and LLE compression socks on.  Neuro: alert & orientedx3, cranial nerves grossly intact. Moves all 4 extremities w/o difficulty. Affect pleasant.     ASSESSMENT & PLAN:

## 2013-01-11 NOTE — Patient Instructions (Addendum)
Follow up next week  Take 40 meq Potassium daily  Take Metolazone 2.5 mg today and tomorrow  Do the following things EVERYDAY: 1) Weigh yourself in the morning before breakfast. Write it down and keep it in a log. 2) Take your medicines as prescribed 3) Eat low salt foods-Limit salt (sodium) to 2000 mg per day.  4) Stay as active as you can everyday 5) Limit all fluids for the day to less than 2 liters

## 2013-01-11 NOTE — Assessment & Plan Note (Signed)
Increased cough likely due to elevated fluid status. Volume status elevated.  Instructed to take Metolazone 2.5 mg today and tomorrow and increase potassium to 40 meq daily. Reinforced low salt food choices, limiting fluid intake < 2 liters per day, and daily weights. Check BMET today. Follow up next week if volume status remains elevated will give IV lasix in HF clinic.

## 2013-01-17 ENCOUNTER — Encounter (HOSPITAL_COMMUNITY): Payer: Self-pay

## 2013-01-17 ENCOUNTER — Ambulatory Visit (HOSPITAL_COMMUNITY)
Admission: RE | Admit: 2013-01-17 | Discharge: 2013-01-17 | Disposition: A | Discharge status: Home / Self Care | Payer: Medicare Other | Source: Ambulatory Visit | Attending: Internal Medicine | Admitting: Internal Medicine

## 2013-01-17 ENCOUNTER — Telehealth (HOSPITAL_COMMUNITY): Payer: Self-pay | Admitting: Adult Health

## 2013-01-17 VITALS — BP 110/58 | HR 78 | Wt 261.8 lb

## 2013-01-17 DIAGNOSIS — R5381 Other malaise: Secondary | ICD-10-CM | POA: Insufficient documentation

## 2013-01-17 DIAGNOSIS — R5383 Other fatigue: Secondary | ICD-10-CM

## 2013-01-17 DIAGNOSIS — I5032 Chronic diastolic (congestive) heart failure: Secondary | ICD-10-CM | POA: Diagnosis not present

## 2013-01-17 LAB — BASIC METABOLIC PANEL
CO2: 34 mEq/L — ABNORMAL HIGH (ref 19–32)
Calcium: 9.4 mg/dL (ref 8.4–10.5)
Creatinine, Ser: 1.26 mg/dL — ABNORMAL HIGH (ref 0.50–1.10)
Glucose, Bld: 209 mg/dL — ABNORMAL HIGH (ref 70–99)

## 2013-01-17 LAB — CBC
MCH: 24.3 pg — ABNORMAL LOW (ref 26.0–34.0)
MCHC: 29.8 g/dL — ABNORMAL LOW (ref 30.0–36.0)
MCV: 81.7 fL (ref 78.0–100.0)
Platelets: 281 10*3/uL (ref 150–400)
RDW: 17.6 % — ABNORMAL HIGH (ref 11.5–15.5)

## 2013-01-17 MED ORDER — TORSEMIDE 20 MG PO TABS: 60.0000 mg | ORAL_TABLET | Freq: Every day | ORAL | Status: DC

## 2013-01-17 NOTE — Patient Instructions (Addendum)
Follow up in 3 weeks  Take Torsemide 60 mg daily  Do the following things EVERYDAY: 1) Weigh yourself in the morning before breakfast. Write it down and keep it in a log. 2) Take your medicines as prescribed 3) Eat low salt foods-Limit salt (sodium) to 2000 mg per day.  4) Stay as active as you can everyday 5) Limit all fluids for the day to less than 2 liters

## 2013-01-17 NOTE — Assessment & Plan Note (Addendum)
Volume status much improved. Weight down 9 pounds since last visit. Increase Torsemide to 60 mg daily. Check BMET and CBC today. Reinforced medication compliance, daily weights, low salt food choices, and limiting fluid intake to < 2 liters per day. Follow up in 3 weeks.

## 2013-01-17 NOTE — Progress Notes (Signed)
Patient ID: [name], female   DOB: Nov 23, 1945, 67 y.o.   MRN: [id number] Primary Cardiologist:  Dr. Arvilla Meres PCP: Dr. Clarene Duke at [facility]  HPI:  [name] is a 67 y.o. female with a history of morbid obesity complicated by obesity hypoventilation syndrome, hypertension, sleep apnea, diabetes, CRI and mild secondary pulmonary hypertension as well as hypokalemia. Carotid stenosis 40-59% bilaterally.    Initially, she had an echocardiogram read as severe LV dysfunction with an EF of 15-20%. She underwent catheterization in 2008 that showed minimal nonobstructive coronary artery disease with EF 55%. She had mild pulmonary hypertension with both pulmonary venous and pulmonary arterial components. Her PVR was 5.1 Wood units. Echo in 3/09 EF 60%.    She was hospitalized at the end of August 2012 for acute on chronic diastolic heart failure.  Diuresed ~17 lbs with discharge weight of 282 lbs.  Switched from ACE-I to ARB due to cough.  She was admitted 04/21/12-04/28/12 for acute on chronic diastolic HF and 25 pound weight gain.  She diuresed with IV lasix and discharge weight was 269 pounds.   Admitted to [facility] due to hyperkalemia and acute renal failure. This was thought to be from Bactroban and spironolactone. Evaluated by Dr Enzo Montgomery. Recommendations to stop spironolactone/ARB.   She returns today for follow up today. Last visit she given Metolazone 2.5 mg for 2 days due to elevated volume status. Weight down 19 pounds. Weight at home trending down form 279 to 259 pounds. Overall she is feeling much better. Denies SOB/PND/Orthopnea.  Ambulating with walker. Compliant with medications. Followed by Dr Lajoyce Corners every other week. She now has an aide 6  Days a week for 4 hours. She has followed a low salt diet over the weekend.   She is not spironolactone/ARB due to acute renal failure and hyperkalemia.      ROS: All systems negative except as listed in HPI, PMH and Problem  List.  Past Medical History  Diagnosis Date  . OSA (obstructive sleep apnea)   . Thrombophlebitis of deep vein of leg   . DM (diabetes mellitus)   . CHF (congestive heart failure)     hx of EF 15-20% in 4/08,  echo 3/09 EF 60-65%;   echo 6/12: EF 55-60%, mild LAE  . Carotid stenosis     u/s 6/10: R 0-39%Ll 40-59%;  u/s 7/12: 40-59% bilateral (repeat in 7/13)  . HTN (hypertension)   . Morbid obesity   . Pulmonary HTN     mild,  cath 6/08 with PVR 5.1  . Hypokalemia   . 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 Spinal stenosis   . Obesity hypoventilation syndrome   . Cancer     hx of colon CA 2000  . Arthritis      Current Outpatient Prescriptions on File Prior to Encounter  Medication Sig Dispense Refill  . allopurinol (ZYLOPRIM) 300 MG tablet Take 300 mg by mouth daily.      Marland Kitchen aspirin (BAYER ASPIRIN) 325 MG tablet Take 325 mg by mouth daily.        . carvedilol (COREG) 3.125 MG tablet Take 3.125 mg by mouth 2 (two) times daily with a meal.       . cyanocobalamin (,VITAMIN B-12,) 1000 MCG/ML injection Inject 1,000 mcg into the muscle every 30 (thirty) days.       . insulin glargine (LANTUS) 100 UNIT/ML injection Inject 42-80 Units into  the skin 2 (two) times daily. Pt uses 42 units in AM and 80 units in PM      . insulin glulisine (APIDRA) 100 UNIT/ML injection Inject 5-15 Units into the skin 3 (three) times daily before meals. Sliding scale      . LORazepam (ATIVAN) 1 MG tablet Take 1 mg by mouth 2 (two) times daily.        . metolazone (ZAROXOLYN) 2.5 MG tablet Take 1 tablet (2.5 mg total) by mouth as needed.  10 tablet  3  . potassium chloride SA (K-DUR,KLOR-CON) 20 MEQ tablet Take 1 tablet (20 mEq total) by mouth 2 (two) times daily.  60 tablet  6  . sertraline (ZOLOFT) 100 MG tablet Take 50 mg by mouth daily.       . simvastatin (ZOCOR) 40 MG tablet Take 1 tablet (40 mg total) by mouth at bedtime.  90 tablet  2  . torsemide (DEMADEX) 20 MG tablet Take  40 mg by mouth daily.      . Vitamin D, Ergocalciferol, (DRISDOL) 50000 UNITS CAPS Take 50,000 Units by mouth 2 (two) times a week. Take on Mon and Thurs.       No current facility-administered medications on file prior to encounter.   Allergies  Allergen Reactions  . Codeine Other (See Comments)    Causes confusion  . Meperidine Hcl Other (See Comments)    Increase in heart rate  . Metformin Nausea And Vomiting     PHYSICAL EXAM: Filed Vitals:   01/17/13 1005  BP: 110/58  Pulse: 78  Weight: 261 lb 12.8 oz (118.752 kg)  SpO2: 92%   Weight: 275>285> 269>280>272>261 Pounds General: Elderly chronically ill appearing wheelchair. No resp difficulty Sister present HEENT: normal Neck: supple. JVP difficult to tell due to body habitus but does not appear elevated.   Carotids 2+ bilaterally; no bruits. No lymphadenopathy or thryomegaly appreciated. Cor: PMI normal. Regular rate & rhythm. No rubs, gallops or murmurs. Lungs: clear Abdomen: obese,  nontender, mildly distended. No hepatosplenomegaly. No bruits or masses. Good bowel sounds. Extremities:  No cyanosis or clubbing. RLE 1+ LLE trace edema. R and LLE compression socks on.  Neuro: alert & orientedx3, cranial nerves grossly intact. Moves all 4 extremities w/o difficulty. Affect pleasant.     ASSESSMENT & PLAN:

## 2013-01-17 NOTE — Assessment & Plan Note (Signed)
Check CBC and BMET today.

## 2013-01-17 NOTE — Telephone Encounter (Signed)
Provided lab work. Potassium and Creatinine stable. Hemoglobin ok.   [name] verbalized understanding. No changes necessary,.   [name] 4:03 PM

## 2013-01-30 ENCOUNTER — Telehealth: Payer: Self-pay | Admitting: Cardiovascular Disease

## 2013-01-30 DIAGNOSIS — M542 Cervicalgia: Secondary | ICD-10-CM | POA: Diagnosis not present

## 2013-01-30 DIAGNOSIS — M9981 Other biomechanical lesions of cervical region: Secondary | ICD-10-CM | POA: Diagnosis not present

## 2013-01-30 NOTE — Telephone Encounter (Signed)
Returned call to [place] and spoke w/ Judeth Cornfield.  Asked if pt had an ABI.  Asked her to hold to review chart for information and call ended before RN returned w/ information.  Will await return call.

## 2013-01-30 NOTE — Telephone Encounter (Signed)
Needs to speak with someone about [name] within the next hour .Marland KitchenPlease Call Thanks

## 2013-01-31 DIAGNOSIS — M542 Cervicalgia: Secondary | ICD-10-CM | POA: Diagnosis not present

## 2013-01-31 DIAGNOSIS — M9981 Other biomechanical lesions of cervical region: Secondary | ICD-10-CM | POA: Diagnosis not present

## 2013-01-31 NOTE — Telephone Encounter (Signed)
Returned call to [place] or [place].  On hold x 3 mins.  Call ended.  Will await return call.  Medical records can process ROI if needed.

## 2013-02-06 ENCOUNTER — Encounter: Payer: Self-pay | Admitting: Pulmonary Disease

## 2013-02-06 ENCOUNTER — Ambulatory Visit (INDEPENDENT_AMBULATORY_CARE_PROVIDER_SITE_OTHER): Payer: Medicare Other | Admitting: Pulmonary Disease

## 2013-02-06 VITALS — BP 124/72 | HR 86 | Temp 99.0°F | Ht 62.0 in | Wt 262.8 lb

## 2013-02-06 DIAGNOSIS — E678 Other specified hyperalimentation: Secondary | ICD-10-CM | POA: Diagnosis not present

## 2013-02-06 NOTE — Patient Instructions (Addendum)
Continue with bilevel machine.  Keep up with mask changes and supplies.  Keep working on weight loss.  You are doing great. followup with me in 6mos.

## 2013-02-06 NOTE — Assessment & Plan Note (Signed)
The patient is doing well on bilevel, and has decreased her weight by 21 pounds since last visit.  I've asked her to continue on her device, and to keep up with her supplies.  Also encouraged her to continue working on weight loss, and to keep up with her fluid balance.

## 2013-02-06 NOTE — Progress Notes (Signed)
  Subjective:    Patient ID: [name], female    DOB: Mar 08, 1946, 67 y.o.   MRN: [id number]  HPI The patient comes in today for followup of her obesity hypoventilation syndrome.  She is wearing bilevel compliantly, and feels that she sleeps well with the device.  She has kept up with mask changes and supplies.  She has worked on getting her weight down, and has actually lost 21 pounds since last visit.  She tells me that while this is fluid.  Her dyspnea on exertion is at her usual baseline.   Review of Systems  Constitutional: Negative for fever and unexpected weight change.  HENT: Negative for ear pain, nosebleeds, congestion, sore throat, rhinorrhea, sneezing, trouble swallowing, dental problem, postnasal drip and sinus pressure.   Eyes: Negative for redness and itching.  Respiratory: Positive for cough ( dry "hacking" ). Negative for chest tightness, shortness of breath and wheezing.   Cardiovascular: Negative for palpitations and leg swelling.  Gastrointestinal: Negative for nausea and vomiting.  Genitourinary: Negative for dysuria.  Musculoskeletal: Negative for joint swelling.  Skin: Negative for rash.  Neurological: Negative for headaches.  Hematological: Does not bruise/bleed easily.  Psychiatric/Behavioral: Negative for dysphoric mood. The patient is not nervous/anxious.        Objective:   Physical Exam Obese female in no acute distress Nose without purulence or discharge noted No skin breakdown or pressure necrosis from the CPAP mask Neck without lymphadenopathy or thyromegaly Chest with a few basilar crackles, otherwise clear Cardiac exam with regular rate and rhythm Lower extremities with 1+ edema, especially involving left foot. Alert and oriented, moves all 4 extremities.       Assessment & Plan:

## 2013-02-07 ENCOUNTER — Ambulatory Visit (HOSPITAL_COMMUNITY)
Admission: RE | Admit: 2013-02-07 | Discharge: 2013-02-07 | Disposition: A | Discharge status: Home / Self Care | Payer: Medicare Other | Source: Ambulatory Visit | Attending: Cardiology | Admitting: Cardiology

## 2013-02-07 VITALS — BP 146/68 | HR 76 | Wt 257.4 lb

## 2013-02-07 DIAGNOSIS — I5032 Chronic diastolic (congestive) heart failure: Secondary | ICD-10-CM

## 2013-02-07 DIAGNOSIS — M109 Gout, unspecified: Secondary | ICD-10-CM | POA: Diagnosis not present

## 2013-02-07 LAB — BASIC METABOLIC PANEL
Calcium: 9.8 mg/dL (ref 8.4–10.5)
Creatinine, Ser: 1.23 mg/dL — ABNORMAL HIGH (ref 0.50–1.10)
GFR calc Af Amer: 51 mL/min — ABNORMAL LOW (ref >90)
GFR calc non Af Amer: 44 mL/min — ABNORMAL LOW (ref >90)

## 2013-02-07 MED ORDER — PREDNISONE 20 MG PO TABS: 40.0000 mg | ORAL_TABLET | Freq: Every day | ORAL | Status: DC

## 2013-02-07 NOTE — Patient Instructions (Addendum)
Continue torsemide 60 mg daily.  Take prednisone 40 mg for 3 days for gout.  Call if any increased SOB or weight gain.  Will call with lab results.  Follow up 5-6 weeks  Do the following things EVERYDAY: 1) Weigh yourself in the morning before breakfast. Write it down and keep it in a log. 2) Take your medicines as prescribed 3) Eat low salt foods-Limit salt (sodium) to 2000 mg per day.  4) Stay as active as you can everyday 5) Limit all fluids for the day to less than 2 liters

## 2013-02-07 NOTE — Progress Notes (Signed)
Patient ID: [name], female   DOB: 09/20/1945, 67 y.o.   MRN: [id number]  Primary Cardiologist:  Dr. Arvilla Meres PCP: Dr. Clarene Duke at [facility]  HPI:  [name] is a 67 y.o. female with a history of morbid obesity complicated by obesity hypoventilation syndrome, hypertension, sleep apnea, diabetes, CRI and mild secondary pulmonary hypertension as well as hypokalemia. Carotid stenosis 40-59% bilaterally.    Initially, she had an echocardiogram read as severe LV dysfunction with an EF of 15-20%. She underwent catheterization in 2008 that showed minimal nonobstructive coronary artery disease with EF 55%. She had mild pulmonary hypertension with both pulmonary venous and pulmonary arterial components. Her PVR was 5.1 Wood units. Echo in 3/09 EF 60%.    She was hospitalized at the end of August 2012 for acute on chronic diastolic heart failure.  Diuresed ~17 lbs with discharge weight of 282 lbs.  Switched from ACE-I to ARB due to cough.  She was admitted 04/21/12-04/28/12 for acute on chronic diastolic HF and 25 pound weight gain.  She diuresed with IV lasix and discharge weight was 269 pounds.   Admitted to [facility] due to hyperkalemia and acute renal failure. This was thought to be from Bactroban and spironolactone. Evaluated by Dr Enzo Montgomery. Recommendations to stop spironolactone/ARB.   Follow up: Last visit increased torsemide to 60 mg daily. Weight remains stable, down another 5 lbs. Weight at home 258-261 lbs. Has not required any doses of metolazone. Feels pretty good other than her L knee has a lot of pain and can't put weight on it. Uses walker at home for short distances, but uses a wheelchair everywhere else. Taking medications as prescribed. Following low salt diet. Has a CNA 6 days a week.  She is not spironolactone/ARB due to acute renal failure and hyperkalemia.   ROS: All systems negative except as listed in HPI, PMH and Problem List.  Past Medical History   Diagnosis Date  . OSA (obstructive sleep apnea)   . Thrombophlebitis of deep vein of leg   . DM (diabetes mellitus)   . CHF (congestive heart failure)     hx of EF 15-20% in 4/08,  echo 3/09 EF 60-65%;   echo 6/12: EF 55-60%, mild LAE  . Carotid stenosis     u/s 6/10: R 0-39%Ll 40-59%;  u/s 7/12: 40-59% bilateral (repeat in 7/13)  . HTN (hypertension)   . Morbid obesity   . Pulmonary HTN     mild,  cath 6/08 with PVR 5.1  . Hypokalemia   . 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 Spinal stenosis   . Obesity hypoventilation syndrome   . Cancer     hx of colon CA 2000  . Arthritis      Current Outpatient Prescriptions on File Prior to Encounter  Medication Sig Dispense Refill  . allopurinol (ZYLOPRIM) 300 MG tablet Take 300 mg by mouth daily.      Marland Kitchen aspirin (BAYER ASPIRIN) 325 MG tablet Take 325 mg by mouth daily.        . carvedilol (COREG) 3.125 MG tablet Take 3.125 mg by mouth 2 (two) times daily with a meal.       . cyanocobalamin (,VITAMIN B-12,) 1000 MCG/ML injection Inject 1,000 mcg into the muscle every 30 (thirty) days.       . insulin glargine (LANTUS) 100 UNIT/ML injection Inject 42-80 Units into the skin 2 (two) times daily. Pt uses  42 units in AM and 80 units in PM      . insulin glulisine (APIDRA) 100 UNIT/ML injection Inject 5-15 Units into the skin 3 (three) times daily before meals. Sliding scale      . LORazepam (ATIVAN) 1 MG tablet Take 1 mg by mouth 2 (two) times daily.        . sertraline (ZOLOFT) 100 MG tablet Take 50 mg by mouth daily.       . simvastatin (ZOCOR) 40 MG tablet Take 1 tablet (40 mg total) by mouth at bedtime.  90 tablet  2  . torsemide (DEMADEX) 20 MG tablet Take 3 tablets (60 mg total) by mouth daily.  90 tablet  6  . Vitamin D, Ergocalciferol, (DRISDOL) 50000 UNITS CAPS Take 50,000 Units by mouth 2 (two) times a week. Take on Mon and Thurs.      . metolazone (ZAROXOLYN) 2.5 MG tablet Take 1 tablet (2.5 mg total) by  mouth as needed.  10 tablet  3   No current facility-administered medications on file prior to encounter.   Allergies  Allergen Reactions  . Codeine Other (See Comments)    Causes confusion  . Meperidine Hcl Other (See Comments)    Increase in heart rate  . Metformin Nausea And Vomiting     PHYSICAL EXAM: Filed Vitals:   02/07/13 1029  BP: 146/68  Pulse: 76  Weight: 257 lb 6.4 oz (116.756 kg)  SpO2: 96%  Weight: 275>285> 269>280>272>261>257 Pounds  General: Elderly chronically ill appearing wheelchair. No resp difficulty Sister present HEENT: normal Neck: supple. JVP difficult to tell due to body habitus but does not appear elevated.   Carotids 2+ bilaterally; no bruits. No lymphadenopathy or thryomegaly appreciated. Cor: PMI normal. Regular rate & rhythm. No rubs, gallops or murmurs. Lungs: CTA Abdomen: obese,  nontender, mildly distended. No hepatosplenomegaly. No bruits or masses. Good bowel sounds. Extremities:  No cyanosis or clubbing. LLE 1+, RLE trace edema' R and LLE compression socks on; L knee warm to touch and tender > R knee Neuro: alert & orientedx3, cranial nerves grossly intact. Moves all 4 extremities w/o difficulty. Affect pleasant.  ASSESSMENT & PLAN:  1) Chronic diastolic HF - Volume status much improved with increase in torsemide to 60 mg daily. - Praised patient for daily weights, low sodium diet, and fluid restriction.  - BMET obtained; follow up 6 weeks  2) Gout L Knee - will give 40 mg x3 days for gout flare -continue allopurinol  Ulla Potash B 1:46 PM

## 2013-02-07 NOTE — Addendum Note (Signed)
Encounter addended by: Aundria Rud, NP on: 02/07/2013  1:48 PM<BR>     Documentation filed: Follow-up Section, LOS Section

## 2013-02-13 DIAGNOSIS — M25579 Pain in unspecified ankle and joints of unspecified foot: Secondary | ICD-10-CM | POA: Diagnosis not present

## 2013-03-15 DIAGNOSIS — G4736 Sleep related hypoventilation in conditions classified elsewhere: Secondary | ICD-10-CM | POA: Diagnosis not present

## 2013-03-15 DIAGNOSIS — M654 Radial styloid tenosynovitis [de Quervain]: Secondary | ICD-10-CM | POA: Diagnosis not present

## 2013-03-15 DIAGNOSIS — E109 Type 1 diabetes mellitus without complications: Secondary | ICD-10-CM | POA: Diagnosis not present

## 2013-03-15 DIAGNOSIS — N189 Chronic kidney disease, unspecified: Secondary | ICD-10-CM | POA: Diagnosis not present

## 2013-03-15 DIAGNOSIS — D518 Other vitamin B12 deficiency anemias: Secondary | ICD-10-CM | POA: Diagnosis not present

## 2013-03-16 DIAGNOSIS — N390 Urinary tract infection, site not specified: Secondary | ICD-10-CM | POA: Diagnosis not present

## 2013-03-21 ENCOUNTER — Ambulatory Visit (HOSPITAL_COMMUNITY)
Admission: RE | Admit: 2013-03-21 | Discharge: 2013-03-21 | Disposition: A | Discharge status: Home / Self Care | Payer: Medicare Other | Source: Ambulatory Visit | Attending: Internal Medicine | Admitting: Internal Medicine

## 2013-03-21 ENCOUNTER — Ambulatory Visit (HOSPITAL_COMMUNITY)
Admission: RE | Admit: 2013-03-21 | Discharge: 2013-03-21 | Disposition: A | Discharge status: Home / Self Care | Payer: Medicare Other | Source: Ambulatory Visit | Attending: Adult Health | Admitting: Adult Health

## 2013-03-21 ENCOUNTER — Encounter (HOSPITAL_COMMUNITY): Payer: Self-pay

## 2013-03-21 VITALS — BP 124/56 | HR 73 | Wt 258.8 lb

## 2013-03-21 DIAGNOSIS — I509 Heart failure, unspecified: Secondary | ICD-10-CM | POA: Insufficient documentation

## 2013-03-21 DIAGNOSIS — Z794 Long term (current) use of insulin: Secondary | ICD-10-CM | POA: Insufficient documentation

## 2013-03-21 DIAGNOSIS — R059 Cough, unspecified: Secondary | ICD-10-CM | POA: Diagnosis not present

## 2013-03-21 DIAGNOSIS — M129 Arthropathy, unspecified: Secondary | ICD-10-CM | POA: Diagnosis not present

## 2013-03-21 DIAGNOSIS — J209 Acute bronchitis, unspecified: Secondary | ICD-10-CM | POA: Diagnosis not present

## 2013-03-21 DIAGNOSIS — E662 Morbid (severe) obesity with alveolar hypoventilation: Secondary | ICD-10-CM | POA: Insufficient documentation

## 2013-03-21 DIAGNOSIS — Z7982 Long term (current) use of aspirin: Secondary | ICD-10-CM | POA: Diagnosis not present

## 2013-03-21 DIAGNOSIS — M109 Gout, unspecified: Secondary | ICD-10-CM | POA: Insufficient documentation

## 2013-03-21 DIAGNOSIS — Z79899 Other long term (current) drug therapy: Secondary | ICD-10-CM | POA: Diagnosis not present

## 2013-03-21 DIAGNOSIS — R0989 Other specified symptoms and signs involving the circulatory and respiratory systems: Secondary | ICD-10-CM | POA: Diagnosis not present

## 2013-03-21 DIAGNOSIS — I6529 Occlusion and stenosis of unspecified carotid artery: Secondary | ICD-10-CM | POA: Diagnosis not present

## 2013-03-21 DIAGNOSIS — R05 Cough: Secondary | ICD-10-CM

## 2013-03-21 DIAGNOSIS — Z85038 Personal history of other malignant neoplasm of large intestine: Secondary | ICD-10-CM | POA: Insufficient documentation

## 2013-03-21 DIAGNOSIS — E876 Hypokalemia: Secondary | ICD-10-CM | POA: Insufficient documentation

## 2013-03-21 DIAGNOSIS — I5032 Chronic diastolic (congestive) heart failure: Secondary | ICD-10-CM | POA: Diagnosis not present

## 2013-03-21 DIAGNOSIS — G4733 Obstructive sleep apnea (adult) (pediatric): Secondary | ICD-10-CM | POA: Diagnosis not present

## 2013-03-21 DIAGNOSIS — E119 Type 2 diabetes mellitus without complications: Secondary | ICD-10-CM | POA: Diagnosis not present

## 2013-03-21 DIAGNOSIS — I10 Essential (primary) hypertension: Secondary | ICD-10-CM | POA: Diagnosis not present

## 2013-03-21 MED ORDER — DOXYCYCLINE HYCLATE 50 MG PO CAPS: 100.0000 mg | ORAL_CAPSULE | Freq: Two times a day (BID) | ORAL | Status: DC

## 2013-03-21 NOTE — Patient Instructions (Addendum)
Take doxycycline 100 mg twice a day  Do the following things EVERYDAY: 1) Weigh yourself in the morning before breakfast. Write it down and keep it in a log. 2) Take your medicines as prescribed 3) Eat low salt foods-Limit salt (sodium) to 2000 mg per day.  4) Stay as active as you can everyday 5) Limit all fluids for the day to less than 2 liters  Follow up in 2 months

## 2013-03-26 NOTE — Progress Notes (Signed)
Patient ID: [name], female   DOB: 03/15/1946, 67 y.o.   MRN: [id number] Primary Cardiologist:  Dr. Arvilla Meres PCP: Dr. Clarene Duke at [facility]  HPI: [name] is a 67 y.o. female with a history of morbid obesity complicated by obesity hypoventilation syndrome, hypertension, sleep apnea, diabetes, CRI, diastolic HF and mild secondary pulmonary hypertension. Carotid stenosis 40-59% bilaterally.    Initially, she had an echocardiogram read as severe LV dysfunction with an EF of 15-20%. She underwent catheterization in 2008 that showed minimal nonobstructive coronary artery disease with EF 55%. She had mild pulmonary hypertension with both pulmonary venous and pulmonary arterial components. Her PVR was 5.1 Wood units. Echo in 3/09 EF 60%.    She was hospitalized at the end of August 2012 for acute on chronic diastolic heart failure.  Diuresed ~17 lbs with discharge weight of 282 lbs.  Switched from ACE-I to ARB due to cough.  She was admitted 04/21/12-04/28/12 for acute on chronic diastolic HF and 25 pound weight gain.  She diuresed with IV lasix and discharge weight was 269 pounds.   Admitted to [facility] due to hyperkalemia and acute renal failure. This was thought to be from Bactroban and spironolactone. Evaluated by Dr Enzo Montgomery. Recommendations to stop spironolactone/ARB.   She returns for follow up. Denies SOB/PND. + Orthopnea. Complains of several week h/o severe non-productive cough evaluated by PCP. Complains of chills but no fever. Weight at home 253-258. She has not required any additional torsemide. She has not required any metolazone. Using CPAP. Compliant with medications. She has an aide 3 hours daily/6 days a week.   Labs  02/07/13 K 4.1 Creatinine 1.2   She is not on spironolactone/ARB due to acute renal failure and hyperkalemia.   ROS: All systems negative except as listed in HPI, PMH and Problem List.  Past Medical History  Diagnosis Date  . OSA (obstructive  sleep apnea)   . Thrombophlebitis of deep vein of leg   . DM (diabetes mellitus)   . CHF (congestive heart failure)     hx of EF 15-20% in 4/08,  echo 3/09 EF 60-65%;   echo 6/12: EF 55-60%, mild LAE  . Carotid stenosis     u/s 6/10: R 0-39%Ll 40-59%;  u/s 7/12: 40-59% bilateral (repeat in 7/13)  . HTN (hypertension)   . Morbid obesity   . Pulmonary HTN     mild,  cath 6/08 with PVR 5.1  . Hypokalemia   . 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 Spinal stenosis   . Obesity hypoventilation syndrome   . Cancer     hx of colon CA 2000  . Arthritis      Current Outpatient Prescriptions on File Prior to Encounter  Medication Sig Dispense Refill  . allopurinol (ZYLOPRIM) 300 MG tablet Take 300 mg by mouth daily.      Marland Kitchen aspirin (BAYER ASPIRIN) 325 MG tablet Take 325 mg by mouth daily.        . carvedilol (COREG) 3.125 MG tablet Take 3.125 mg by mouth 2 (two) times daily with a meal.       . cyanocobalamin (,VITAMIN B-12,) 1000 MCG/ML injection Inject 1,000 mcg into the muscle every 30 (thirty) days.       . insulin glargine (LANTUS) 100 UNIT/ML injection Inject 42-80 Units into the skin 2 (two) times daily. Pt uses 42 units in AM and 80 units in PM      .  insulin glulisine (APIDRA) 100 UNIT/ML injection Inject 5-15 Units into the skin 3 (three) times daily before meals. Sliding scale      . LORazepam (ATIVAN) 1 MG tablet Take 1 mg by mouth 2 (two) times daily.        . metolazone (ZAROXOLYN) 2.5 MG tablet Take 1 tablet (2.5 mg total) by mouth as needed.  10 tablet  3  . potassium chloride SA (K-DUR,KLOR-CON) 20 MEQ tablet Take 20 mEq by mouth daily.      . sertraline (ZOLOFT) 100 MG tablet Take 50 mg by mouth daily.       . simvastatin (ZOCOR) 40 MG tablet Take 1 tablet (40 mg total) by mouth at bedtime.  90 tablet  2  . torsemide (DEMADEX) 20 MG tablet Take 3 tablets (60 mg total) by mouth daily.  90 tablet  6  . Vitamin D, Ergocalciferol, (DRISDOL) 50000 UNITS  CAPS Take 50,000 Units by mouth 2 (two) times a week. Take on Mon and Thurs.       No current facility-administered medications on file prior to encounter.   Allergies  Allergen Reactions  . Codeine Other (See Comments)    Causes confusion  . Meperidine Hcl Other (See Comments)    Increase in heart rate (demerol)  . Metformin Nausea And Vomiting     PHYSICAL EXAM: Filed Vitals:   03/21/13 0952  BP: 124/56  Pulse: 73  Weight: 258 lb 12.8 oz (117.391 kg)  SpO2: 95%  Weight: 275>285> 269>280>272>261>257>258 Pounds  General: Elderly chronically ill appearing wheelchair. No resp difficulty. Hacking cough. Sister present HEENT: normal Neck: supple. JVP difficult to tell due to body habitus but does not appear elevated.   Carotids 2+ bilaterally; no bruits. No lymphadenopathy or thryomegaly appreciated. Cor: PMI normal. Regular rate & rhythm. No rubs, gallops or murmurs. Lungs: RML RLL Rhonchi Abdomen: obese,  nontender, mildly distended. No hepatosplenomegaly. No bruits or masses. Good bowel sounds. Extremities:  No cyanosis or clubbing. LLE  RLE trace edema R and LLE compression socks on; Neuro: alert & orientedx3, cranial nerves grossly intact. Moves all 4 extremities w/o difficulty. Affect pleasant.  ASSESSMENT & PLAN:  1) Chronic diastolic HF - Volume status stable. Continue torsemide 60 mg daily. Instructed tol take an additional 20 mg if her weight is up 4 pounds in 24 hours. Would like to keep 258-261pounds.  - Continue daily weights, low sodium diet, and fluid restriction.   2) Gout L Knee Acute pain resolved. Continue allopurinol 300 mg daily  3) Cough/Acute bronchitis Check CXR. (reviewed - no infiltrates) Start doxycycline 100 mg  Bid for 7 days.   4) Carotid stenosis Will need yearly u/s. Will schedule at next visit.   Follow up 2 months  Arvilla Meres MD 9:27 PM

## 2013-04-04 DIAGNOSIS — N3020 Other chronic cystitis without hematuria: Secondary | ICD-10-CM | POA: Diagnosis not present

## 2013-04-04 DIAGNOSIS — N3000 Acute cystitis without hematuria: Secondary | ICD-10-CM | POA: Diagnosis not present

## 2013-04-25 ENCOUNTER — Other Ambulatory Visit: Payer: Self-pay | Admitting: Dermatology

## 2013-04-25 DIAGNOSIS — L980 Pyogenic granuloma: Secondary | ICD-10-CM | POA: Diagnosis not present

## 2013-04-25 DIAGNOSIS — D0460 Carcinoma in situ of skin of unspecified upper limb, including shoulder: Secondary | ICD-10-CM | POA: Diagnosis not present

## 2013-04-25 DIAGNOSIS — C44621 Squamous cell carcinoma of skin of unspecified upper limb, including shoulder: Secondary | ICD-10-CM | POA: Diagnosis not present

## 2013-05-15 DIAGNOSIS — E1149 Type 2 diabetes mellitus with other diabetic neurological complication: Secondary | ICD-10-CM | POA: Diagnosis not present

## 2013-05-15 DIAGNOSIS — I83219 Varicose veins of right lower extremity with both ulcer of unspecified site and inflammation: Secondary | ICD-10-CM | POA: Diagnosis not present

## 2013-05-15 DIAGNOSIS — B351 Tinea unguium: Secondary | ICD-10-CM | POA: Diagnosis not present

## 2013-06-08 DIAGNOSIS — Z23 Encounter for immunization: Secondary | ICD-10-CM | POA: Diagnosis not present

## 2013-06-08 DIAGNOSIS — Z1331 Encounter for screening for depression: Secondary | ICD-10-CM | POA: Diagnosis not present

## 2013-06-08 DIAGNOSIS — I10 Essential (primary) hypertension: Secondary | ICD-10-CM | POA: Diagnosis not present

## 2013-06-08 DIAGNOSIS — F341 Dysthymic disorder: Secondary | ICD-10-CM | POA: Diagnosis not present

## 2013-06-08 DIAGNOSIS — Z79899 Other long term (current) drug therapy: Secondary | ICD-10-CM | POA: Diagnosis not present

## 2013-06-12 DIAGNOSIS — L570 Actinic keratosis: Secondary | ICD-10-CM | POA: Diagnosis not present

## 2013-06-12 DIAGNOSIS — Z85828 Personal history of other malignant neoplasm of skin: Secondary | ICD-10-CM | POA: Diagnosis not present

## 2013-06-16 ENCOUNTER — Encounter (HOSPITAL_COMMUNITY): Payer: Self-pay | Admitting: *Deleted

## 2013-06-16 ENCOUNTER — Emergency Department (HOSPITAL_COMMUNITY): Payer: Medicare Other

## 2013-06-16 ENCOUNTER — Inpatient Hospital Stay (HOSPITAL_COMMUNITY)
Admission: EM | Admit: 2013-06-16 | Discharge: 2013-06-20 | DRG: 617 | Disposition: A | Discharge status: Skilled Nursing Facility | Payer: Medicare Other | Attending: Internal Medicine | Admitting: Internal Medicine

## 2013-06-16 DIAGNOSIS — I129 Hypertensive chronic kidney disease with stage 1 through stage 4 chronic kidney disease, or unspecified chronic kidney disease: Secondary | ICD-10-CM | POA: Diagnosis present

## 2013-06-16 DIAGNOSIS — M9080 Osteopathy in diseases classified elsewhere, unspecified site: Secondary | ICD-10-CM | POA: Diagnosis present

## 2013-06-16 DIAGNOSIS — Z79899 Other long term (current) drug therapy: Secondary | ICD-10-CM

## 2013-06-16 DIAGNOSIS — I739 Peripheral vascular disease, unspecified: Secondary | ICD-10-CM | POA: Diagnosis present

## 2013-06-16 DIAGNOSIS — M86179 Other acute osteomyelitis, unspecified ankle and foot: Secondary | ICD-10-CM | POA: Diagnosis present

## 2013-06-16 DIAGNOSIS — D649 Anemia, unspecified: Secondary | ICD-10-CM

## 2013-06-16 DIAGNOSIS — M25569 Pain in unspecified knee: Secondary | ICD-10-CM | POA: Diagnosis not present

## 2013-06-16 DIAGNOSIS — E1169 Type 2 diabetes mellitus with other specified complication: Secondary | ICD-10-CM

## 2013-06-16 DIAGNOSIS — E1129 Type 2 diabetes mellitus with other diabetic kidney complication: Secondary | ICD-10-CM | POA: Diagnosis not present

## 2013-06-16 DIAGNOSIS — E662 Morbid (severe) obesity with alveolar hypoventilation: Secondary | ICD-10-CM | POA: Diagnosis present

## 2013-06-16 DIAGNOSIS — I5030 Unspecified diastolic (congestive) heart failure: Secondary | ICD-10-CM | POA: Diagnosis not present

## 2013-06-16 DIAGNOSIS — E11621 Type 2 diabetes mellitus with foot ulcer: Secondary | ICD-10-CM | POA: Diagnosis present

## 2013-06-16 DIAGNOSIS — G4733 Obstructive sleep apnea (adult) (pediatric): Secondary | ICD-10-CM | POA: Diagnosis present

## 2013-06-16 DIAGNOSIS — S88119A Complete traumatic amputation at level between knee and ankle, unspecified lower leg, initial encounter: Secondary | ICD-10-CM | POA: Diagnosis not present

## 2013-06-16 DIAGNOSIS — M6281 Muscle weakness (generalized): Secondary | ICD-10-CM | POA: Diagnosis not present

## 2013-06-16 DIAGNOSIS — S98119A Complete traumatic amputation of unspecified great toe, initial encounter: Secondary | ICD-10-CM

## 2013-06-16 DIAGNOSIS — Z6841 Body Mass Index (BMI) 40.0 and over, adult: Secondary | ICD-10-CM

## 2013-06-16 DIAGNOSIS — I10 Essential (primary) hypertension: Secondary | ICD-10-CM | POA: Diagnosis not present

## 2013-06-16 DIAGNOSIS — Z7982 Long term (current) use of aspirin: Secondary | ICD-10-CM

## 2013-06-16 DIAGNOSIS — IMO0002 Reserved for concepts with insufficient information to code with codable children: Principal | ICD-10-CM | POA: Diagnosis present

## 2013-06-16 DIAGNOSIS — I509 Heart failure, unspecified: Secondary | ICD-10-CM

## 2013-06-16 DIAGNOSIS — M86679 Other chronic osteomyelitis, unspecified ankle and foot: Secondary | ICD-10-CM | POA: Diagnosis not present

## 2013-06-16 DIAGNOSIS — N183 Chronic kidney disease, stage 3 unspecified: Secondary | ICD-10-CM | POA: Diagnosis present

## 2013-06-16 DIAGNOSIS — M86172 Other acute osteomyelitis, left ankle and foot: Secondary | ICD-10-CM

## 2013-06-16 DIAGNOSIS — R489 Unspecified symbolic dysfunctions: Secondary | ICD-10-CM | POA: Diagnosis not present

## 2013-06-16 DIAGNOSIS — E118 Type 2 diabetes mellitus with unspecified complications: Secondary | ICD-10-CM | POA: Diagnosis not present

## 2013-06-16 DIAGNOSIS — R262 Difficulty in walking, not elsewhere classified: Secondary | ICD-10-CM | POA: Diagnosis not present

## 2013-06-16 DIAGNOSIS — D696 Thrombocytopenia, unspecified: Secondary | ICD-10-CM | POA: Diagnosis present

## 2013-06-16 DIAGNOSIS — E1165 Type 2 diabetes mellitus with hyperglycemia: Secondary | ICD-10-CM | POA: Diagnosis present

## 2013-06-16 DIAGNOSIS — D72829 Elevated white blood cell count, unspecified: Secondary | ICD-10-CM | POA: Diagnosis not present

## 2013-06-16 DIAGNOSIS — N179 Acute kidney failure, unspecified: Secondary | ICD-10-CM | POA: Diagnosis present

## 2013-06-16 DIAGNOSIS — M869 Osteomyelitis, unspecified: Secondary | ICD-10-CM | POA: Diagnosis not present

## 2013-06-16 DIAGNOSIS — E119 Type 2 diabetes mellitus without complications: Secondary | ICD-10-CM | POA: Diagnosis present

## 2013-06-16 DIAGNOSIS — R269 Unspecified abnormalities of gait and mobility: Secondary | ICD-10-CM | POA: Diagnosis not present

## 2013-06-16 DIAGNOSIS — I499 Cardiac arrhythmia, unspecified: Secondary | ICD-10-CM | POA: Diagnosis not present

## 2013-06-16 DIAGNOSIS — E1142 Type 2 diabetes mellitus with diabetic polyneuropathy: Secondary | ICD-10-CM | POA: Diagnosis present

## 2013-06-16 DIAGNOSIS — L98499 Non-pressure chronic ulcer of skin of other sites with unspecified severity: Secondary | ICD-10-CM | POA: Diagnosis present

## 2013-06-16 DIAGNOSIS — Z85038 Personal history of other malignant neoplasm of large intestine: Secondary | ICD-10-CM

## 2013-06-16 DIAGNOSIS — L97509 Non-pressure chronic ulcer of other part of unspecified foot with unspecified severity: Secondary | ICD-10-CM | POA: Diagnosis present

## 2013-06-16 DIAGNOSIS — M79609 Pain in unspecified limb: Secondary | ICD-10-CM | POA: Diagnosis not present

## 2013-06-16 DIAGNOSIS — E1159 Type 2 diabetes mellitus with other circulatory complications: Secondary | ICD-10-CM | POA: Diagnosis not present

## 2013-06-16 DIAGNOSIS — Z8249 Family history of ischemic heart disease and other diseases of the circulatory system: Secondary | ICD-10-CM

## 2013-06-16 DIAGNOSIS — M25869 Other specified joint disorders, unspecified knee: Secondary | ICD-10-CM | POA: Diagnosis not present

## 2013-06-16 DIAGNOSIS — M6250 Muscle wasting and atrophy, not elsewhere classified, unspecified site: Secondary | ICD-10-CM | POA: Diagnosis not present

## 2013-06-16 DIAGNOSIS — Z9089 Acquired absence of other organs: Secondary | ICD-10-CM

## 2013-06-16 DIAGNOSIS — L97311 Non-pressure chronic ulcer of right ankle limited to breakdown of skin: Secondary | ICD-10-CM

## 2013-06-16 DIAGNOSIS — I2789 Other specified pulmonary heart diseases: Secondary | ICD-10-CM | POA: Diagnosis present

## 2013-06-16 DIAGNOSIS — E1149 Type 2 diabetes mellitus with other diabetic neurological complication: Secondary | ICD-10-CM | POA: Diagnosis not present

## 2013-06-16 DIAGNOSIS — A4901 Methicillin susceptible Staphylococcus aureus infection, unspecified site: Secondary | ICD-10-CM | POA: Diagnosis present

## 2013-06-16 DIAGNOSIS — I5032 Chronic diastolic (congestive) heart failure: Secondary | ICD-10-CM | POA: Diagnosis present

## 2013-06-16 DIAGNOSIS — L02619 Cutaneous abscess of unspecified foot: Secondary | ICD-10-CM | POA: Diagnosis present

## 2013-06-16 DIAGNOSIS — L97909 Non-pressure chronic ulcer of unspecified part of unspecified lower leg with unspecified severity: Secondary | ICD-10-CM | POA: Diagnosis not present

## 2013-06-16 DIAGNOSIS — E7800 Pure hypercholesterolemia, unspecified: Secondary | ICD-10-CM | POA: Diagnosis present

## 2013-06-16 DIAGNOSIS — N189 Chronic kidney disease, unspecified: Secondary | ICD-10-CM | POA: Diagnosis present

## 2013-06-16 DIAGNOSIS — N181 Chronic kidney disease, stage 1: Secondary | ICD-10-CM | POA: Diagnosis not present

## 2013-06-16 DIAGNOSIS — Z794 Long term (current) use of insulin: Secondary | ICD-10-CM

## 2013-06-16 DIAGNOSIS — G4730 Sleep apnea, unspecified: Secondary | ICD-10-CM | POA: Diagnosis not present

## 2013-06-16 DIAGNOSIS — I5033 Acute on chronic diastolic (congestive) heart failure: Secondary | ICD-10-CM

## 2013-06-16 LAB — COMPREHENSIVE METABOLIC PANEL
ALT: 17 U/L (ref 0–35)
AST: 20 U/L (ref 0–37)
Albumin: 3.4 g/dL — ABNORMAL LOW (ref 3.5–5.2)
Alkaline Phosphatase: 71 U/L (ref 39–117)
BUN: 54 mg/dL — ABNORMAL HIGH (ref 6–23)
CO2: 24 mEq/L (ref 19–32)
Calcium: 9.5 mg/dL (ref 8.4–10.5)
Chloride: 100 mEq/L (ref 96–112)
Creatinine, Ser: 1.5 mg/dL — ABNORMAL HIGH (ref 0.50–1.10)
GFR calc Af Amer: 40 mL/min — ABNORMAL LOW (ref >90)
GFR calc non Af Amer: 35 mL/min — ABNORMAL LOW (ref >90)
Glucose, Bld: 130 mg/dL — ABNORMAL HIGH (ref 70–99)
Potassium: 3.9 mEq/L (ref 3.5–5.1)
Sodium: 137 mEq/L (ref 135–145)
Total Bilirubin: 0.6 mg/dL (ref 0.3–1.2)
Total Protein: 6.9 g/dL (ref 6.0–8.3)

## 2013-06-16 LAB — CBC WITH DIFFERENTIAL/PLATELET
Basophils Absolute: 0 10*3/uL (ref 0.0–0.1)
Basophils Relative: 0 % (ref 0–1)
Eosinophils Absolute: 0.2 10*3/uL (ref 0.0–0.7)
Eosinophils Relative: 1 % (ref 0–5)
HCT: 31.3 % — ABNORMAL LOW (ref 36.0–46.0)
Hemoglobin: 10.4 g/dL — ABNORMAL LOW (ref 12.0–15.0)
Lymphocytes Relative: 6 % — ABNORMAL LOW (ref 12–46)
Lymphs Abs: 1.1 10*3/uL (ref 0.7–4.0)
MCH: 28.8 pg (ref 26.0–34.0)
MCHC: 33.2 g/dL (ref 30.0–36.0)
MCV: 86.7 fL (ref 78.0–100.0)
Monocytes Absolute: 1.6 10*3/uL — ABNORMAL HIGH (ref 0.1–1.0)
Monocytes Relative: 9 % (ref 3–12)
Neutro Abs: 15.4 10*3/uL — ABNORMAL HIGH (ref 1.7–7.7)
Neutrophils Relative %: 85 % — ABNORMAL HIGH (ref 43–77)
Platelets: 124 10*3/uL — ABNORMAL LOW (ref 150–400)
RBC: 3.61 MIL/uL — ABNORMAL LOW (ref 3.87–5.11)
RDW: 16.6 % — ABNORMAL HIGH (ref 11.5–15.5)
WBC: 18.1 10*3/uL — ABNORMAL HIGH (ref 4.0–10.5)

## 2013-06-16 LAB — GLUCOSE, CAPILLARY

## 2013-06-16 MED ORDER — INSULIN ASPART 100 UNIT/ML ~~LOC~~ SOLN
0.0000 [IU] | Freq: Three times a day (TID) | SUBCUTANEOUS | Status: DC
  Administered 2013-06-17: 2 [IU] via SUBCUTANEOUS
  Administered 2013-06-17: 13:00:00 5 [IU] via SUBCUTANEOUS
  Administered 2013-06-17 – 2013-06-18 (×2): 1 [IU] via SUBCUTANEOUS
  Administered 2013-06-18: 13:00:00 2 [IU] via SUBCUTANEOUS

## 2013-06-16 MED ORDER — ACETAMINOPHEN 325 MG PO TABS
650.0000 mg | ORAL_TABLET | Freq: Four times a day (QID) | ORAL | Status: DC | PRN
  Administered 2013-06-19: 650 mg via ORAL
  Filled 2013-06-16: qty 2

## 2013-06-16 MED ORDER — ONDANSETRON HCL 4 MG PO TABS: 4.0000 mg | ORAL_TABLET | Freq: Four times a day (QID) | ORAL | Status: DC | PRN

## 2013-06-16 MED ORDER — INSULIN ASPART 100 UNIT/ML ~~LOC~~ SOLN
0.0000 [IU] | Freq: Every day | SUBCUTANEOUS | Status: DC
  Administered 2013-06-17: 21:00:00 2 [IU] via SUBCUTANEOUS

## 2013-06-16 MED ORDER — PIPERACILLIN-TAZOBACTAM 3.375 G IVPB
3.3750 g | Freq: Three times a day (TID) | INTRAVENOUS | Status: DC
  Filled 2013-06-16: qty 50

## 2013-06-16 MED ORDER — VANCOMYCIN HCL 10 G IV SOLR
1500.0000 mg | INTRAVENOUS | Status: DC
  Administered 2013-06-17 – 2013-06-18 (×2): 1500 mg via INTRAVENOUS
  Filled 2013-06-16 (×3): qty 1500

## 2013-06-16 MED ORDER — ASPIRIN 325 MG PO TABS
325.0000 mg | ORAL_TABLET | Freq: Every day | ORAL | Status: DC
  Administered 2013-06-16 – 2013-06-20 (×5): 325 mg via ORAL
  Filled 2013-06-16 (×5): qty 1

## 2013-06-16 MED ORDER — VITAMIN D (ERGOCALCIFEROL) 1.25 MG (50000 UNIT) PO CAPS
50000.0000 [IU] | ORAL_CAPSULE | ORAL | Status: DC
  Administered 2013-06-19: 50000 [IU] via ORAL
  Filled 2013-06-16 (×3): qty 1

## 2013-06-16 MED ORDER — PIPERACILLIN-TAZOBACTAM 3.375 G IVPB 30 MIN
3.3750 g | Freq: Once | INTRAVENOUS | Status: AC
  Administered 2013-06-16: 3.375 g via INTRAVENOUS
  Filled 2013-06-16: qty 50

## 2013-06-16 MED ORDER — CARVEDILOL 3.125 MG PO TABS
3.1250 mg | ORAL_TABLET | Freq: Two times a day (BID) | ORAL | Status: DC
  Administered 2013-06-17 – 2013-06-20 (×7): 3.125 mg via ORAL
  Filled 2013-06-16 (×9): qty 1

## 2013-06-16 MED ORDER — OXYCODONE HCL 5 MG PO TABS
5.0000 mg | ORAL_TABLET | ORAL | Status: DC | PRN
  Administered 2013-06-17 – 2013-06-18 (×3): 5 mg via ORAL
  Filled 2013-06-16 (×2): qty 1

## 2013-06-16 MED ORDER — ACETAMINOPHEN 650 MG RE SUPP: 650.0000 mg | Freq: Four times a day (QID) | RECTAL | Status: DC | PRN

## 2013-06-16 MED ORDER — VANCOMYCIN HCL IN DEXTROSE 1-5 GM/200ML-% IV SOLN: 1000.0000 mg | Freq: Once | INTRAVENOUS | Status: DC

## 2013-06-16 MED ORDER — CYANOCOBALAMIN 1000 MCG/ML IJ SOLN: 1000.0000 ug | INTRAMUSCULAR | Status: DC

## 2013-06-16 MED ORDER — MORPHINE SULFATE 4 MG/ML IJ SOLN
4.0000 mg | Freq: Once | INTRAMUSCULAR | Status: AC
  Administered 2013-06-16: 4 mg via INTRAVENOUS
  Filled 2013-06-16: qty 1

## 2013-06-16 MED ORDER — SODIUM CHLORIDE 0.9 % IV SOLN
INTRAVENOUS | Status: DC
  Administered 2013-06-16 – 2013-06-18 (×2): via INTRAVENOUS
  Administered 2013-06-19: 13:00:00 100 mL via INTRAVENOUS
  Administered 2013-06-20: 02:00:00 via INTRAVENOUS

## 2013-06-16 MED ORDER — PIPERACILLIN-TAZOBACTAM 3.375 G IVPB
3.3750 g | Freq: Three times a day (TID) | INTRAVENOUS | Status: DC
  Administered 2013-06-17 – 2013-06-19 (×8): 3.375 g via INTRAVENOUS
  Filled 2013-06-16 (×9): qty 50

## 2013-06-16 MED ORDER — SERTRALINE HCL 50 MG PO TABS
50.0000 mg | ORAL_TABLET | Freq: Every day | ORAL | Status: DC
  Administered 2013-06-16 – 2013-06-20 (×5): 50 mg via ORAL
  Filled 2013-06-16 (×5): qty 1

## 2013-06-16 MED ORDER — ZOLPIDEM TARTRATE 5 MG PO TABS: 5.0000 mg | ORAL_TABLET | Freq: Every evening | ORAL | Status: DC | PRN

## 2013-06-16 MED ORDER — SODIUM CHLORIDE 0.9 % IV SOLN
INTRAVENOUS | Status: DC
  Administered 2013-06-17: 10:00:00 via INTRAVENOUS

## 2013-06-16 MED ORDER — ALLOPURINOL 300 MG PO TABS
300.0000 mg | ORAL_TABLET | Freq: Every day | ORAL | Status: DC
  Administered 2013-06-17 – 2013-06-20 (×4): 300 mg via ORAL
  Filled 2013-06-16 (×5): qty 1

## 2013-06-16 MED ORDER — INSULIN ASPART 100 UNIT/ML ~~LOC~~ SOLN
5.0000 [IU] | Freq: Three times a day (TID) | SUBCUTANEOUS | Status: DC
  Administered 2013-06-17 – 2013-06-19 (×4): 5 [IU] via SUBCUTANEOUS

## 2013-06-16 MED ORDER — VANCOMYCIN HCL 10 G IV SOLR
2000.0000 mg | INTRAVENOUS | Status: AC
  Administered 2013-06-16: 2000 mg via INTRAVENOUS
  Filled 2013-06-16: qty 2000

## 2013-06-16 MED ORDER — SIMVASTATIN 40 MG PO TABS
40.0000 mg | ORAL_TABLET | Freq: Every day | ORAL | Status: DC
  Administered 2013-06-16 – 2013-06-19 (×4): 40 mg via ORAL
  Filled 2013-06-16 (×6): qty 1

## 2013-06-16 MED ORDER — POTASSIUM CHLORIDE CRYS ER 20 MEQ PO TBCR
20.0000 meq | EXTENDED_RELEASE_TABLET | Freq: Every day | ORAL | Status: DC
  Administered 2013-06-16 – 2013-06-17 (×2): 20 meq via ORAL
  Filled 2013-06-16 (×2): qty 1

## 2013-06-16 MED ORDER — INSULIN GLARGINE 100 UNIT/ML ~~LOC~~ SOLN
42.0000 [IU] | Freq: Every day | SUBCUTANEOUS | Status: DC
  Administered 2013-06-17 – 2013-06-19 (×3): 42 [IU] via SUBCUTANEOUS
  Filled 2013-06-16 (×4): qty 0.42

## 2013-06-16 MED ORDER — LORAZEPAM 1 MG PO TABS
1.0000 mg | ORAL_TABLET | Freq: Two times a day (BID) | ORAL | Status: DC
  Administered 2013-06-16 – 2013-06-19 (×7): 1 mg via ORAL
  Filled 2013-06-16 (×7): qty 1

## 2013-06-16 MED ORDER — ENOXAPARIN SODIUM 40 MG/0.4ML ~~LOC~~ SOLN
40.0000 mg | SUBCUTANEOUS | Status: DC
  Administered 2013-06-16: 22:00:00 40 mg via SUBCUTANEOUS
  Filled 2013-06-16: qty 0.4

## 2013-06-16 MED ORDER — ONDANSETRON HCL 4 MG/2ML IJ SOLN
4.0000 mg | Freq: Four times a day (QID) | INTRAMUSCULAR | Status: DC | PRN
  Filled 2013-06-16: qty 2

## 2013-06-16 MED ORDER — INSULIN GLARGINE 100 UNIT/ML ~~LOC~~ SOLN
80.0000 [IU] | Freq: Every day | SUBCUTANEOUS | Status: DC
  Administered 2013-06-16 – 2013-06-19 (×4): 80 [IU] via SUBCUTANEOUS
  Filled 2013-06-16 (×5): qty 0.8

## 2013-06-16 MED ORDER — HYDROMORPHONE HCL PF 1 MG/ML IJ SOLN
0.5000 mg | INTRAMUSCULAR | Status: DC | PRN
  Administered 2013-06-16 – 2013-06-18 (×3): 1 mg via INTRAVENOUS
  Filled 2013-06-16 (×4): qty 1

## 2013-06-16 MED ORDER — TORSEMIDE 20 MG PO TABS
60.0000 mg | ORAL_TABLET | Freq: Every day | ORAL | Status: DC
  Administered 2013-06-17: 10:00:00 60 mg via ORAL
  Filled 2013-06-16: qty 3

## 2013-06-16 NOTE — H&P (Signed)
[facility] History and Physical  [place] Neubauer ZOX:[id number] DOB: 09-05-1945 DOA: 06/16/2013  Referring physician:  EDP PCP: Ailene Ravel, MD  Specialists:   Chief Complaint:  Pain Swelling and Redness of Left Foot  HPI: [place] H Meng is a 67 y.o. female with Multiple Medical problems with recent Left Great toe Amputation who presents to the ED with complaints of worsening swelling and redness and pain of her left foot over the past 4 days.  She also reports having fevers and chills today.   She was seen by Orthopedics Dr. Lajoyce Corners 5 days ago and has been under surveillance for a blood blister on the plantar surface of her left foot.   In the ED an X-ray was performed which revealed diffuse osteomyelitis and she was placed on IV Vancomycin and Zosyn and Dr. Lajoyce Corners was consulted.      Review of Systems: The patient has had: fever, chills      She  denies anorexia, headaches, weight loss,, vision loss, diplopia, dizziness, decreased hearing, rhinitis, hoarseness, chest pain, syncope, dyspnea on exertion, peripheral edema, balance deficits, cough, hemoptysis, abdominal pain, nausea, vomiting, diarrhea, constipation, hematemesis, melena, hematochezia, severe indigestion/heartburn, dysuria, hematuria, incontinence, muscle weakness, transient blindness, difficulty walking, depression, unusual weight change, abnormal bleeding, enlarged lymph nodes, angioedema, and breast masses.    Past Medical History  Diagnosis Date  . OSA (obstructive sleep apnea)   . Thrombophlebitis of deep vein of leg   . DM (diabetes mellitus)   . CHF (congestive heart failure)     hx of EF 15-20% in 4/08,  echo 3/09 EF 60-65%;   echo 6/12: EF 55-60%, mild LAE  . Carotid stenosis     u/s 6/10: R 0-39%Ll 40-59%;  u/s 7/12: 40-59% bilateral (repeat in 7/13)  . HTN (hypertension)   . Morbid obesity   . Pulmonary HTN     mild,  cath 6/08 with PVR 5.1  . Hypokalemia   . Osteomyelitis of toe     s/p partial  resection of 1st R toe  . Spinal stenosis   . Obesity hypoventilation syndrome   . Cancer     hx of colon CA 2000  . Arthritis     Past Surgical History  Procedure Laterality Date  . Carpal tunnel release  07/2007  . Colon cancer with resection  2000  . Cholecystectomy  2002  . Vaginal hysterectomy  1983  . Eye surgery      bilateral cataracts   . Amputation Left 10/07/2012    Procedure: AMPUTATION RAY;  Surgeon: Nadara Mustard, MD;  Location: [facility] OR;  Service: Orthopedics;  Laterality: Left;  Left foot 1st Ray Amputation    Prior to Admission medications   Medication Sig Start Date End Date Taking? Authorizing Provider  allopurinol (ZYLOPRIM) 300 MG tablet Take 300 mg by mouth daily.   Yes Historical Provider, MD  aspirin (BAYER ASPIRIN) 325 MG tablet Take 325 mg by mouth daily.     Yes Historical Provider, MD  carvedilol (COREG) 3.125 MG tablet Take 3.125 mg by mouth 2 (two) times daily with a meal.    Yes Historical Provider, MD  insulin glargine (LANTUS) 100 UNIT/ML injection Inject 42-80 Units into the skin 2 (two) times daily. Pt uses 42 units in AM and 80 units in PM   Yes Historical Provider, MD  insulin glulisine (APIDRA) 100 UNIT/ML injection Inject 5-20 Units into the skin 3 (three) times daily before meals. Sliding scale   Yes Historical  Provider, MD  LORazepam (ATIVAN) 1 MG tablet Take 1 mg by mouth 2 (two) times daily.     Yes Historical Provider, MD  potassium chloride SA (K-DUR,KLOR-CON) 20 MEQ tablet Take 20 mEq by mouth daily. 01/11/13  Yes Amy D Clegg, NP  sertraline (ZOLOFT) 100 MG tablet Take 50 mg by mouth daily.    Yes Historical Provider, MD  simvastatin (ZOCOR) 40 MG tablet Take 1 tablet (40 mg total) by mouth at bedtime. 11/11/12  Yes Dolores Patty, MD  torsemide (DEMADEX) 20 MG tablet Take 3 tablets (60 mg total) by mouth daily. 01/17/13  Yes Amy D Clegg, NP  Vitamin D, Ergocalciferol, (DRISDOL) 50000 UNITS CAPS Take 50,000 Units by mouth 2 (two) times a week.  Take on Mon and Thurs.   Yes Historical Provider, MD  cyanocobalamin (,VITAMIN B-12,) 1000 MCG/ML injection Inject 1,000 mcg into the muscle every 30 (thirty) days.     Historical Provider, MD    Allergies  Allergen Reactions  . Codeine Other (See Comments)    Causes confusion  . Meperidine Hcl Other (See Comments)    Increase in heart rate (demerol)  . Metformin Nausea And Vomiting    Social History:  reports that she has never smoked. She has never used smokeless tobacco. She reports that she does not drink alcohol or use illicit drugs.     Family History  Problem Relation Age of Onset  . Coronary artery disease Father        Physical Exam:  GEN:  Pleasant Obese Elderly  67 y.o. Caucasian  female  examined  and in no acute distress; cooperative with exam Filed Vitals:   06/16/13 1915 06/16/13 1930 06/16/13 2000 06/16/13 2131  BP: 145/54 133/52 144/39 102/62  Pulse:  73 78 77  Temp:    98.1 F (36.7 C)  TempSrc:    Oral  Resp: 28 26 18 18   Height:    5\' 2"  (1.575 m)  Weight:    123.4 kg (272 lb 0.8 oz)  SpO2:  97% 94% 92%   Blood pressure 102/62, pulse 77, temperature 98.1 F (36.7 C), temperature source Oral, resp. rate 18, height 5\' 2"  (1.575 m), weight 123.4 kg (272 lb 0.8 oz), SpO2 92.00%. PSYCH: SHe is alert and oriented x4; does not appear anxious does not appear depressed; affect is normal HEENT: Normocephalic and Atraumatic, Mucous membranes pink; PERRLA; EOM intact; Fundi:  Benign;  No scleral icterus, Nares: Patent, Oropharynx: Clear, Fair Dentition, Neck:  FROM, no cervical lymphadenopathy nor thyromegaly or carotid bruit; no JVD; Breasts:: Not examined CHEST WALL: No tenderness CHEST: Normal respiration, clear to auscultation bilaterally HEART: Regular rate and rhythm; no murmurs rubs or gallops BACK: No kyphosis or scoliosis; no CVA tenderness ABDOMEN: Positive Bowel Sounds,  Obese, soft non-tender; no masses, no organomegaly, no pannus; no intertriginous  candida. Rectal Exam: Not done EXTREMITIES:+ charcot Foot of the Left, Amputated Left Great Toe,   no cyanosis, clubbing or edema of the RLE; +Bullous lesion on mid-plantar surface of the Left Foot.  Quarter size Lateral Stage II Right Foot ulcer  Genitalia: not examined PULSES: 2+ and symmetric SKIN: Normal hydration no rash or ulceration CNS: Cranial nerves 2-12 grossly intact no focal neurologic deficit    Labs on Admission:  Basic Metabolic Panel:  Recent Labs Lab 06/16/13 1753  NA 137  K 3.9  CL 100  CO2 24  GLUCOSE 130*  BUN 54*  CREATININE 1.50*  CALCIUM 9.5   Liver  Function Tests:  Recent Labs Lab 06/16/13 1753  AST 20  ALT 17  ALKPHOS 71  BILITOT 0.6  PROT 6.9  ALBUMIN 3.4*   No results found for this basename: LIPASE, AMYLASE,  in the last 168 hours No results found for this basename: AMMONIA,  in the last 168 hours CBC:  Recent Labs Lab 06/16/13 1753  WBC 18.1*  NEUTROABS 15.4*  HGB 10.4*  HCT 31.3*  MCV 86.7  PLT 124*   Cardiac Enzymes: No results found for this basename: CKTOTAL, CKMB, CKMBINDEX, TROPONINI,  in the last 168 hours  BNP (last 3 results) No results found for this basename: PROBNP,  in the last 8760 hours CBG: No results found for this basename: GLUCAP,  in the last 168 hours  Radiological Exams on Admission: Dg Foot Complete Left  06/16/2013   CLINICAL DATA:  Infection.  History of left great toe amputation.  EXAM: LEFT FOOT - COMPLETE 3+ VIEW  COMPARISON:  Left great toe a radiographs 09/22/2012  FINDINGS: There has been interval amputation of the 1st ray. There is lucency/erosion in the medial cuneiform, consist with osteomyelitis. Since the radiographs of 09/22/2012, there has been loss of the normal tarsometatarsal joint spaces, with decreased mineralization hand indistinctness of the cortical margins of the tarsals and the proximal metatarsals. Subluxation of the tarsometatarsal joints cannot be excluded.  There is extensive  periosteal reaction/new bone formation about the distal 2nd metatarsal, that is new from prior radiographs. The 2nd metatarsophalangeal joint space is widened and likely subluxed. There is also periosteal reaction about the distal 3rd metatarsal. There is extensive soft tissue swelling about the left foot. There are severe peripheral vascular atherosclerotic calcifications.  IMPRESSION: Findings very worrisome for diffuse osteomyelitis of the left foot, most prominent at the level of the tarsometatarsal joints. Given the disorganization tarsometatarsal joints, an associated Charcot's joint is not excluded. Osteomyelitis of at least the 2nd and 3rd metatarsals is also suspected. Probable diffuse cellulitis of the left foot given the extensive soft tissue swelling.   Electronically Signed   By: Britta Mccreedy M.D.   On: 06/16/2013 18:59        Assessment/Plan Principal Problem:   Osteomyelitis of foot, left, acute  Active Problems:   DM   Pure hypercholesterolemia   OBSTRUCTIVE SLEEP APNEA   HYPERTENSION   Chronic diastolic heart failure   Diabetic foot ulcer   Acute on chronic renal failure   Normocytic anemia   Thrombocytopenia, unspecified   Leukocytosis, unspecified     1.   Osteomyelitis Left foot-  Per X-rays,  MRI of left foot ordered in AM, placed on IV Vancomycin and Zosyn, and Orthopedics to see in AM.    2.   Diabetes Mellitus Type II- continue Lantus Insulin and add SSI coverage and Check HbA1c.    3.   Diabetic Foot Ulcer- on right lateral Foot,  Wound Care Evaluation in AM.     4.   HTN- continue Torsemide and Carvedilol.    5.   Chronic diastolic CHF-  Caution with hydration to avoid volume overload.   On torsemide and Carvedilol.    6.   Normocytic Anemia- check Anemia panel.    7.   Thrombocytopenia-  An acute phase reactant, monitor trend, may be due to #1.    8.   Leukocytosis- due to #1, IV antibiotics in #1.  Monitor trend.    9.   OSA-  BIPAP qhs, monitor  O2 saturations.  10.   DVT Prophylaxis with  SCDs.         Code Status:  FULL CODE Family Communication:    Daughter at bedside Disposition Plan:     Inpatient  Time spent:   77 Minutes  Ron Parker [facility] Pager [phone]  If 7PM-7AM, please contact night-coverage [email or web address] Password [facility] 06/16/2013, 9:52 PM

## 2013-06-16 NOTE — Progress Notes (Signed)
ANTIBIOTIC CONSULT NOTE - INITIAL  Pharmacy Consult for Vancomycin and Zosyn Indication: foot ulcer/cellulitis/abscess  Allergies  Allergen Reactions  . Codeine Other (See Comments)    Causes confusion  . Meperidine Hcl Other (See Comments)    Increase in heart rate (demerol)  . Metformin Nausea And Vomiting    Patient Measurements: Weight: 261 lb (118.389 kg) Adjusted Body Weight: n/a  Vital Signs: Temp: 98.5 F (36.9 C) (11/28 1738) Temp src: Oral (11/28 1738) BP: 165/67 mmHg (11/28 1738) Pulse Rate: 93 (11/28 1738) Intake/Output from previous day:   Intake/Output from this shift:    Labs:  Recent Labs  06/16/13 1753  WBC 18.1*  HGB 10.4*  PLT 124*  CREATININE 1.50*   The CrCl is unknown because both a height and weight (above a minimum accepted value) are required for this calculation. No results found for this basename: VANCOTROUGH, VANCOPEAK, VANCORANDOM, GENTTROUGH, GENTPEAK, GENTRANDOM, TOBRATROUGH, TOBRAPEAK, TOBRARND, AMIKACINPEAK, AMIKACINTROU, AMIKACIN,  in the last 72 hours   Microbiology: No results found for this or any previous visit (from the past 720 hour(s)).  Medical History: Past Medical History  Diagnosis Date  . OSA (obstructive sleep apnea)   . Thrombophlebitis of deep vein of leg   . DM (diabetes mellitus)   . CHF (congestive heart failure)     hx of EF 15-20% in 4/08,  echo 3/09 EF 60-65%;   echo 6/12: EF 55-60%, mild LAE  . Carotid stenosis     u/s 6/10: R 0-39%Ll 40-59%;  u/s 7/12: 40-59% bilateral (repeat in 7/13)  . HTN (hypertension)   . Morbid obesity   . Pulmonary HTN     mild,  cath 6/08 with PVR 5.1  . Hypokalemia   . 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 Spinal stenosis   . Obesity hypoventilation syndrome   . Cancer     hx of colon CA 2000  . Arthritis    Assessment: 67 yo female admitted with red area on bottom of foot, with suspected diabetic foot ulcer with concurrent  cellulitis/possible abscess.  Pharmacy asked to begin empiric antibiotics with vancomycin and Zosyn.  Scr 1.5, estimated CrCl ~ 40 ml/min.  Wt = 118 kg per pt report, Scr   Goal of Therapy:  Vancomycin trough level 15-20 mcg/ml  Plan:  1. Zosyn 3.375g IV q 8 hrs, extended-interval infusion over 4 hrs. 2. Vancomycin 2g IV x 1 now, then vancomycin 1500 mg IV q 24 hrs. 3. F/U cultures, clinical course. 4. Vancomycin trough at steady state as needed.  Tad Moore, BCPS  Clinical Pharmacist Pager [phone]  06/16/2013 7:01 PM

## 2013-06-16 NOTE — ED Notes (Signed)
Pt reports "black spot" on bottom of left foot x weeks. Seen by Dr Doyce Loose on Monday for same. Pt reports increased swelling and pain yesterday.  Hx: diabetes

## 2013-06-16 NOTE — ED Provider Notes (Signed)
Medical screening examination/treatment/procedure(s) were performed by non-physician practitioner and as supervising physician I was immediately available for consultation/collaboration.  EKG Interpretation   None       Pt with injury to left foot, severe diabetic neuropathy, has had great toe amputation in the past due to infection.  Seen by Dr. Lajoyce Corners on Monday after a dark spot appeared.  Pt walked in basement yesterday barefoot, may have injured it again, now painful, swollen, fever at home to 101, orally.  Has red area to bottom of foot, indurated, tender, foot and lower leg are very hot to touch.  May have worsening abscess to bottom of foot, probably diabetic foot ulcer and now cellulitis as well.  Will need IV abx to cover cellulitis and pseudomonas, orthopedic consultation and/or admission.    Gavin Pound. Antonyo Hinderer, MD 06/16/13 1610

## 2013-06-16 NOTE — ED Provider Notes (Signed)
CSN: [id number]     Arrival date & time 06/16/13  1730 History   First MD Initiated Contact with Patient 06/16/13 1737     Chief Complaint  Patient presents with  . Foot Pain   (Consider location/radiation/quality/duration/timing/severity/associated sxs/prior Treatment) HPI Patient presents to the emergency department with left foot discomfort and a swollen area to the bottom of her foot.  Patient, states she saw Dr. Lajoyce Corners on Monday and he observed this area and advised her to continue to monitor the spot.  The patient, states the area was much smaller on Monday, that is today.  Patient, states she's had chills today and felt like she had a fever.  Patient denies shortness of breath, nausea, vomiting, chest pain, abdominal pain, weakness, dizziness, or syncope.  Patient, states, that palpation and movement make the pain, worse Past Medical History  Diagnosis Date  . OSA (obstructive sleep apnea)   . Thrombophlebitis of deep vein of leg   . DM (diabetes mellitus)   . CHF (congestive heart failure)     hx of EF 15-20% in 4/08,  echo 3/09 EF 60-65%;   echo 6/12: EF 55-60%, mild LAE  . Carotid stenosis     u/s 6/10: R 0-39%Ll 40-59%;  u/s 7/12: 40-59% bilateral (repeat in 7/13)  . HTN (hypertension)   . Morbid obesity   . Pulmonary HTN     mild,  cath 6/08 with PVR 5.1  . Hypokalemia   . 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 Spinal stenosis   . Obesity hypoventilation syndrome   . Cancer     hx of colon CA 2000  . Arthritis    Past Surgical History  Procedure Laterality Date  . Carpal tunnel release  07/2007  . Colon cancer with resection  2000  . Cholecystectomy  2002  . Vaginal hysterectomy  1983  . Eye surgery      bilateral cataracts   . Amputation Left 10/07/2012    Procedure: AMPUTATION RAY;  Surgeon: Nadara Mustard, MD;  Location: [facility] OR;  Service: Orthopedics;  Laterality: Left;  Left foot 1st Ray Amputation   Family History  Problem Relation Age  of Onset  . Coronary artery disease Father    History  Substance Use Topics  . Smoking status: Never Smoker   . Smokeless tobacco: Never Used  . Alcohol Use: No   OB History   Grav Para Term Preterm Abortions TAB SAB Ect Mult Living                 Review of Systems All other systems negative except as documented in the HPI. All pertinent positives and negatives as reviewed in the HPI. Allergies  Codeine; Meperidine hcl; and Metformin  Home Medications   Current Outpatient Rx  Name  Route  Sig  Dispense  Refill  . allopurinol (ZYLOPRIM) 300 MG tablet   Oral   Take 300 mg by mouth daily.         Marland Kitchen aspirin (BAYER ASPIRIN) 325 MG tablet   Oral   Take 325 mg by mouth daily.           . carvedilol (COREG) 3.125 MG tablet   Oral   Take 3.125 mg by mouth 2 (two) times daily with a meal.          . insulin glargine (LANTUS) 100 UNIT/ML injection   Subcutaneous   Inject 42-80 Units into the skin  2 (two) times daily. Pt uses 42 units in AM and 80 units in PM         . insulin glulisine (APIDRA) 100 UNIT/ML injection   Subcutaneous   Inject 5-20 Units into the skin 3 (three) times daily before meals. Sliding scale         . LORazepam (ATIVAN) 1 MG tablet   Oral   Take 1 mg by mouth 2 (two) times daily.           . potassium chloride SA (K-DUR,KLOR-CON) 20 MEQ tablet   Oral   Take 20 mEq by mouth daily.         . sertraline (ZOLOFT) 100 MG tablet   Oral   Take 50 mg by mouth daily.          . simvastatin (ZOCOR) 40 MG tablet   Oral   Take 1 tablet (40 mg total) by mouth at bedtime.   90 tablet   2   . torsemide (DEMADEX) 20 MG tablet   Oral   Take 3 tablets (60 mg total) by mouth daily.   90 tablet   6   . Vitamin D, Ergocalciferol, (DRISDOL) 50000 UNITS CAPS   Oral   Take 50,000 Units by mouth 2 (two) times a week. Take on Mon and Thurs.         . cyanocobalamin (,VITAMIN B-12,) 1000 MCG/ML injection   Intramuscular   Inject 1,000 mcg  into the muscle every 30 (thirty) days.           BP 165/67  Pulse 93  Temp(Src) 98.5 F (36.9 C) (Oral)  Resp 18  Wt 261 lb (118.389 kg)  SpO2 93% Physical Exam  Nursing note and vitals reviewed. Constitutional: She appears well-developed and well-nourished. No distress.  HENT:  Head: Normocephalic and atraumatic.  Mouth/Throat: Oropharynx is clear and moist.  Cardiovascular: Normal rate, regular rhythm and normal heart sounds.  Exam reveals no gallop and no friction rub.   No murmur heard. Pulmonary/Chest: Effort normal and breath sounds normal. No respiratory distress.  Abdominal: Soft. Bowel sounds are normal. She exhibits no distension.  Musculoskeletal:  Patient has an area, the size of half dollar to the plantar aspect of her left foot in the mid foot region.  There is surrounding redness and warmth as well.  Her some mild streaking to the medial aspect of the foot to the dorsal portion as well.  Patient like feels warmer than the right  Skin: Skin is warm and dry.    ED Course  Procedures (including critical care time) Labs Review Labs Reviewed  CBC WITH DIFFERENTIAL - Abnormal; Notable for the following:    WBC 18.1 (*)    RBC 3.61 (*)    Hemoglobin 10.4 (*)    HCT 31.3 (*)    RDW 16.6 (*)    Platelets 124 (*)    Neutrophils Relative % 85 (*)    Neutro Abs 15.4 (*)    Lymphocytes Relative 6 (*)    Monocytes Absolute 1.6 (*)    All other components within normal limits  COMPREHENSIVE METABOLIC PANEL - Abnormal; Notable for the following:    Glucose, Bld 130 (*)    BUN 54 (*)    Creatinine, Ser 1.50 (*)    Albumin 3.4 (*)    GFR calc non Af Amer 35 (*)    GFR calc Af Amer 40 (*)    All other components within normal limits  CBC WITH  DIFFERENTIAL   Imaging Review Dg Foot Complete Left  06/16/2013   CLINICAL DATA:  Infection.  History of left great toe amputation.  EXAM: LEFT FOOT - COMPLETE 3+ VIEW  COMPARISON:  Left great toe a radiographs 09/22/2012   FINDINGS: There has been interval amputation of the 1st ray. There is lucency/erosion in the medial cuneiform, consist with osteomyelitis. Since the radiographs of 09/22/2012, there has been loss of the normal tarsometatarsal joint spaces, with decreased mineralization hand indistinctness of the cortical margins of the tarsals and the proximal metatarsals. Subluxation of the tarsometatarsal joints cannot be excluded.  There is extensive periosteal reaction/new bone formation about the distal 2nd metatarsal, that is new from prior radiographs. The 2nd metatarsophalangeal joint space is widened and likely subluxed. There is also periosteal reaction about the distal 3rd metatarsal. There is extensive soft tissue swelling about the left foot. There are severe peripheral vascular atherosclerotic calcifications.  IMPRESSION: Findings very worrisome for diffuse osteomyelitis of the left foot, most prominent at the level of the tarsometatarsal joints. Given the disorganization tarsometatarsal joints, an associated Charcot's joint is not excluded. Osteomyelitis of at least the 2nd and 3rd metatarsals is also suspected. Probable diffuse cellulitis of the left foot given the extensive soft tissue swelling.   Electronically Signed   By: Britta Mccreedy M.D.   On: 06/16/2013 18:59    I spoke with Dr. Lajoyce Corners about the patient and he will p.m. to evaluate her for further care.  I will also speak to she was on for unassigned medicine, about admission for the patient.  She's been started on Zosyn and vancomycin.  Lab tests and x-rays were reviewed.    Carlyle Dolly, PA-C 06/16/13 1916

## 2013-06-16 NOTE — Progress Notes (Signed)
Patient admitted to 5w13 from ED. Patient lives at home with husband. Patient's daughter at bedside.  Patient is A&Ox3. Patient's sacrum is slightly red but blanchable, scabbed areas to bilateral arms and left side of face, rt foot has scabbed red area on top of foot, left ankle is red, left foot has black area that is soft on bottom of foot. Patient states she just recently went to skin doctor to have areas removed that is why she has scabbed areas. Patient oriented to unit and room. Placed patient on bedalarm, red socks and yellow armband. Will continue to monitor patient. Nelda Marseille, RN

## 2013-06-16 NOTE — Progress Notes (Signed)
RT Note: Placed pt on Auto CPAP with FFM per home use. Pt unsure of home settings. Pt tolerating well at this time on Auto titrate.

## 2013-06-17 ENCOUNTER — Inpatient Hospital Stay (HOSPITAL_COMMUNITY): Payer: Medicare Other

## 2013-06-17 DIAGNOSIS — D72829 Elevated white blood cell count, unspecified: Secondary | ICD-10-CM | POA: Diagnosis present

## 2013-06-17 DIAGNOSIS — D649 Anemia, unspecified: Secondary | ICD-10-CM

## 2013-06-17 DIAGNOSIS — L02619 Cutaneous abscess of unspecified foot: Secondary | ICD-10-CM | POA: Diagnosis not present

## 2013-06-17 DIAGNOSIS — E119 Type 2 diabetes mellitus without complications: Secondary | ICD-10-CM | POA: Diagnosis not present

## 2013-06-17 DIAGNOSIS — E1149 Type 2 diabetes mellitus with other diabetic neurological complication: Secondary | ICD-10-CM | POA: Diagnosis not present

## 2013-06-17 DIAGNOSIS — D696 Thrombocytopenia, unspecified: Secondary | ICD-10-CM | POA: Diagnosis present

## 2013-06-17 DIAGNOSIS — M86179 Other acute osteomyelitis, unspecified ankle and foot: Secondary | ICD-10-CM | POA: Diagnosis not present

## 2013-06-17 DIAGNOSIS — I5032 Chronic diastolic (congestive) heart failure: Secondary | ICD-10-CM | POA: Diagnosis not present

## 2013-06-17 LAB — BASIC METABOLIC PANEL
CO2: 25 mEq/L (ref 19–32)
Calcium: 8.9 mg/dL (ref 8.4–10.5)
Creatinine, Ser: 1.73 mg/dL — ABNORMAL HIGH (ref 0.50–1.10)
GFR calc non Af Amer: 29 mL/min — ABNORMAL LOW (ref >90)
Potassium: 4.2 mEq/L (ref 3.5–5.1)
Sodium: 139 mEq/L (ref 135–145)

## 2013-06-17 LAB — GLUCOSE, CAPILLARY
Glucose-Capillary: 138 mg/dL — ABNORMAL HIGH (ref 70–99)
Glucose-Capillary: 198 mg/dL — ABNORMAL HIGH (ref 70–99)
Glucose-Capillary: 236 mg/dL — ABNORMAL HIGH (ref 70–99)

## 2013-06-17 LAB — HEMOGLOBIN A1C
Hgb A1c MFr Bld: 10.2 % — ABNORMAL HIGH (ref <5.7)
Mean Plasma Glucose: 246 mg/dL — ABNORMAL HIGH (ref <117)

## 2013-06-17 LAB — CBC
Platelets: 127 10*3/uL — ABNORMAL LOW (ref 150–400)
RBC: 3.4 MIL/uL — ABNORMAL LOW (ref 3.87–5.11)
RDW: 16.8 % — ABNORMAL HIGH (ref 11.5–15.5)
WBC: 21 10*3/uL — ABNORMAL HIGH (ref 4.0–10.5)

## 2013-06-17 LAB — SURGICAL PCR SCREEN
MRSA, PCR: NEGATIVE
Staphylococcus aureus: POSITIVE — AB

## 2013-06-17 MED ORDER — MUPIROCIN 2 % EX OINT
1.0000 "application " | TOPICAL_OINTMENT | Freq: Two times a day (BID) | CUTANEOUS | Status: DC
  Administered 2013-06-17 – 2013-06-20 (×7): 1 via NASAL
  Filled 2013-06-17 (×2): qty 22

## 2013-06-17 MED ORDER — CHLORHEXIDINE GLUCONATE CLOTH 2 % EX PADS
6.0000 | MEDICATED_PAD | Freq: Every day | CUTANEOUS | Status: DC
  Administered 2013-06-17 – 2013-06-20 (×4): 6 via TOPICAL

## 2013-06-17 MED ORDER — MUPIROCIN 2 % EX OINT: 1.0000 "application " | TOPICAL_OINTMENT | Freq: Two times a day (BID) | CUTANEOUS | Status: DC

## 2013-06-17 NOTE — Progress Notes (Signed)
UR completed. Luke Falero RN CCM Case Mgmt 

## 2013-06-17 NOTE — Consult Note (Signed)
Reason for Consult: Abscess left foot Referring Physician: Dr Glenford Bayley [name] is an 67 y.o. female.  HPI: [name] is a 67 year old woman with diabetic insensate neuropathy peripheral vascular disease sleep apnea renal failure with a Charcot collapse of the left foot. [name] was seen in my office about a week ago and had a stable Charcot arthropathy with no redness no cellulitis no tenderness to palpation. [name] states that yesterday [name] had acute onset of pain in [name] left foot and had a fever. [name] states that the pain was severe enough that [name] went to the emergency room.  Past Medical History  Diagnosis Date  . OSA (obstructive sleep apnea)   . Thrombophlebitis of deep vein of leg   . DM (diabetes mellitus)   . CHF (congestive heart failure)     hx of EF 15-20% in 4/08,  echo 3/09 EF 60-65%;   echo 6/12: EF 55-60%, mild LAE  . Carotid stenosis     u/s 6/10: R 0-39%Ll 40-59%;  u/s 7/12: 40-59% bilateral (repeat in 7/13)  . HTN (hypertension)   . Morbid obesity   . Pulmonary HTN     mild,  cath 6/08 with PVR 5.1  . Hypokalemia   . 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 Spinal stenosis   . Obesity hypoventilation syndrome   . Cancer     hx of colon CA 2000  . Arthritis     Past Surgical History  Procedure Laterality Date  . Carpal tunnel release  07/2007  . Colon cancer with resection  2000  . Cholecystectomy  2002  . Vaginal hysterectomy  1983  . Eye surgery      bilateral cataracts   . Amputation Left 10/07/2012    Procedure: AMPUTATION RAY;  Surgeon: Nadara Mustard, MD;  Location: [facility] OR;  Service: Orthopedics;  Laterality: Left;  Left foot 1st Ray Amputation    Family History  Problem Relation Age of Onset  . Coronary artery disease Father     Social History:  reports that [name] has never smoked. [name] has never used smokeless tobacco. [name] reports that [name] does not drink alcohol or use illicit drugs.  Allergies:  Allergies  Allergen  Reactions  . Codeine Other (See Comments)    Causes confusion  . Meperidine Hcl Other (See Comments)    Increase in heart rate (demerol)  . Metformin Nausea And Vomiting    Medications: I have reviewed the [name]'s current medications.  Results for orders placed during the hospital encounter of 06/16/13 (from the past 48 hour(s))  CBC WITH DIFFERENTIAL     Status: Abnormal   Collection Time    06/16/13  5:53 PM      Result Value Range   WBC 18.1 (*) 4.0 - 10.5 K/uL   RBC 3.61 (*) 3.87 - 5.11 MIL/uL   Hemoglobin 10.4 (*) 12.0 - 15.0 g/dL   HCT 16.1 (*) 09.6 - 04.5 %   MCV 86.7  78.0 - 100.0 fL   MCH 28.8  26.0 - 34.0 pg   MCHC 33.2  30.0 - 36.0 g/dL   RDW 40.9 (*) 81.1 - 91.4 %   Platelets 124 (*) 150 - 400 K/uL   Neutrophils Relative % 85 (*) 43 - 77 %   Neutro Abs 15.4 (*) 1.7 - 7.7 K/uL   Lymphocytes Relative 6 (*) 12 - 46 %   Lymphs Abs 1.1  0.7 - 4.0  K/uL   Monocytes Relative 9  3 - 12 %   Monocytes Absolute 1.6 (*) 0.1 - 1.0 K/uL   Eosinophils Relative 1  0 - 5 %   Eosinophils Absolute 0.2  0.0 - 0.7 K/uL   Basophils Relative 0  0 - 1 %   Basophils Absolute 0.0  0.0 - 0.1 K/uL  COMPREHENSIVE METABOLIC PANEL     Status: Abnormal   Collection Time    06/16/13  5:53 PM      Result Value Range   Sodium 137  135 - 145 mEq/L   Potassium 3.9  3.5 - 5.1 mEq/L   Chloride 100  96 - 112 mEq/L   CO2 24  19 - 32 mEq/L   Glucose, Bld 130 (*) 70 - 99 mg/dL   BUN 54 (*) 6 - 23 mg/dL   Creatinine, Ser 4.09 (*) 0.50 - 1.10 mg/dL   Calcium 9.5  8.4 - 81.1 mg/dL   Total Protein 6.9  6.0 - 8.3 g/dL   Albumin 3.4 (*) 3.5 - 5.2 g/dL   AST 20  0 - 37 U/L   ALT 17  0 - 35 U/L   Alkaline Phosphatase 71  39 - 117 U/L   Total Bilirubin 0.6  0.3 - 1.2 mg/dL   GFR calc non Af Amer 35 (*) >90 mL/min   GFR calc Af Amer 40 (*) >90 mL/min   Comment: (NOTE)     The eGFR has been calculated using the CKD EPI equation.     This calculation has not been validated in all clinical situations.      eGFR's persistently <90 mL/min signify possible Chronic Kidney     Disease.  SURGICAL PCR SCREEN     Status: Abnormal   Collection Time    06/16/13  9:04 PM      Result Value Range   MRSA, PCR NEGATIVE  NEGATIVE   Staphylococcus aureus POSITIVE (*) NEGATIVE   Comment:            The Xpert SA Assay (FDA     approved for NASAL specimens     in patients over 51 years of age),     is one component of     a comprehensive surveillance     program.  Test performance has     been validated by [employer] for patients greater     than or equal to 42 year old.     It is not intended     to diagnose infection nor to     guide or monitor treatment.     RESULT CALLED TO, READ BACK BY AND VERIFIED WITH:     Dana Botts RN [phone] GREEN R  GLUCOSE, CAPILLARY     Status: Abnormal   Collection Time    06/16/13  9:08 PM      Result Value Range   Glucose-Capillary 107 (*) 70 - 99 mg/dL   Comment 1 Documented in Chart     Comment 2 Notify RN    BASIC METABOLIC PANEL     Status: Abnormal   Collection Time    06/17/13  5:30 AM      Result Value Range   Sodium 139  135 - 145 mEq/L   Potassium 4.2  3.5 - 5.1 mEq/L   Chloride 101  96 - 112 mEq/L   CO2 25  19 - 32 mEq/L   Glucose, Bld 127 (*) 70 -  99 mg/dL   BUN 57 (*) 6 - 23 mg/dL   Creatinine, Ser 4.09 (*) 0.50 - 1.10 mg/dL   Calcium 8.9  8.4 - 81.1 mg/dL   GFR calc non Af Amer 29 (*) >90 mL/min   GFR calc Af Amer 34 (*) >90 mL/min   Comment: (NOTE)     The eGFR has been calculated using the CKD EPI equation.     This calculation has not been validated in all clinical situations.     eGFR's persistently <90 mL/min signify possible Chronic Kidney     Disease.  CBC     Status: Abnormal   Collection Time    06/17/13  5:30 AM      Result Value Range   WBC 21.0 (*) 4.0 - 10.5 K/uL   RBC 3.40 (*) 3.87 - 5.11 MIL/uL   Hemoglobin 9.8 (*) 12.0 - 15.0 g/dL   HCT 91.4 (*) 78.2 - 95.6 %   MCV 88.5  78.0 - 100.0 fL   MCH 28.8   26.0 - 34.0 pg   MCHC 32.6  30.0 - 36.0 g/dL   RDW 21.3 (*) 08.6 - 57.8 %   Platelets 127 (*) 150 - 400 K/uL  GLUCOSE, CAPILLARY     Status: Abnormal   Collection Time    06/17/13  7:48 AM      Result Value Range   Glucose-Capillary 138 (*) 70 - 99 mg/dL   Comment 1 Documented in Chart     Comment 2 Notify RN      Dg Foot Complete Left  06/16/2013   CLINICAL DATA:  Infection.  History of left great toe amputation.  EXAM: LEFT FOOT - COMPLETE 3+ VIEW  COMPARISON:  Left great toe a radiographs 09/22/2012  FINDINGS: There has been interval amputation of the 1st ray. There is lucency/erosion in the medial cuneiform, consist with osteomyelitis. Since the radiographs of 09/22/2012, there has been loss of the normal tarsometatarsal joint spaces, with decreased mineralization hand indistinctness of the cortical margins of the tarsals and the proximal metatarsals. Subluxation of the tarsometatarsal joints cannot be excluded.  There is extensive periosteal reaction/new bone formation about the distal 2nd metatarsal, that is new from prior radiographs. The 2nd metatarsophalangeal joint space is widened and likely subluxed. There is also periosteal reaction about the distal 3rd metatarsal. There is extensive soft tissue swelling about the left foot. There are severe peripheral vascular atherosclerotic calcifications.  IMPRESSION: Findings very worrisome for diffuse osteomyelitis of the left foot, most prominent at the level of the tarsometatarsal joints. Given the disorganization tarsometatarsal joints, an associated Charcot's joint is not excluded. Osteomyelitis of at least the 2nd and 3rd metatarsals is also suspected. Probable diffuse cellulitis of the left foot given the extensive soft tissue swelling.   Electronically Signed   By: Britta Mccreedy M.D.   On: 06/16/2013 18:59    Review of Systems  All other systems reviewed and are negative.   Blood pressure 113/65, pulse 79, temperature 98.1 F (36.7  C), temperature source Oral, resp. rate 20, height 5\' 2"  (1.575 m), weight 123.4 kg (272 lb 0.8 oz), SpO2 94.00%. Physical Exam On examination [name] does have swelling in the left lower extremity. [name] has a palpable dorsalis pedis pulse. [name] does not have protective sensation. [name] has redness and cellulitis which would appears to be a benign hematoma beneath the rocker-bottom deformity of the Charcot collapse. After informed consent and sterile prepping the hematoma was debrided of skin and soft  tissue with a 15 blade knife. The wound area was 3 cm in diameter. There was a very superficial abscess. After debridement of the abscess [name] still had fluctuance beneath the ulcer. A 15 blade knife was used to incise the area deep to the abscess and this showed a large purulent abscess. Cultures were obtained and sent. The deep abscess was debrided of skin soft tissue and muscle. The abscess probed all the way to bone and was approximately 4 cm deep there was a large cavernous area of the deep abscess. Volume approximately 4 cm in diameter. Radiograph shows Charcot arthropathy with destructive changes of [name] lesser toes however the lesser toes show no clinical signs of infection there is no swelling there is no redness no cellulitis there is no streaking up [name] legs. Assessment/Plan: Assessment: Charcot arthropathy left foot with diabetic insensate neuropathy with a large abscess of the hindfoot and osteomyelitis of the hindfoot.  Plan: [name] is scheduled for an MRI scan of [name] left foot today anticipate this will confirm the clinical findings of the deep osteomyelitis and abscess. Will plan for surgery tomorrow morning for a transtibial amputation of the left. Risks and benefits were discussed with the [name] and [name] sister they state they understand and wish to proceed at this time. N.p.o. after midnight.  DUDA,[name] 06/17/2013, 10:35 AM

## 2013-06-17 NOTE — Progress Notes (Signed)
[facility] PROGRESS NOTE  [name] XBJ:[id number] DOB: 1945/12/24 DOA: 06/16/2013 PCP: Ailene Ravel, MD  HPI/Subjective: Sister at bedside, pain is controlled with pain medications. Denies any fever or chills.  Assessment/Plan: Principal Problem:   Osteomyelitis of foot, left, acute Active Problems:   DM   Pure hypercholesterolemia   OBSTRUCTIVE SLEEP APNEA   HYPERTENSION   Chronic diastolic heart failure   Diabetic foot ulcer   Acute on chronic renal failure   Normocytic anemia   Thrombocytopenia, unspecified   Leukocytosis, unspecified    Left foot osteomyelitis/diabetic foot ulcer -With insensate neuropathy with Charcot foot deformity. -MRI to be done, seen by Dr. Lajoyce Corners appreciate his help. -Likely to have transtibial amputation in a.m.  Diabetes mellitus type 2 -Check hemoglobin A1c, patient is on diabetic diet. -Insulin sliding scale.  Chronic diastolic CHF -Patient is on torsemide carvedilol, no evidence of fluid overload. -On IV fluids, will discontinue as patient does not seems to be dehydrated. -Hold diuretics on the day of surgery.  Leukocytosis -Likely secondary to left foot osteomyelitis/diabetic foot. -Follow closely after initiation of antibiotics.  Hypertension -Patient is on carvedilol.  Code Status: Full code Family Communication: Plan discussed with the patient. Disposition Plan: Remains inpatient   Consultants:  Dr. Lajoyce Corners  Procedures:  None  Antibiotics:  Vancomycin and Zosyn   Objective: Filed Vitals:   06/17/13 0952  BP: 113/65  Pulse: 79  Temp: 98.1 F (36.7 C)  Resp: 20    Intake/Output Summary (Last 24 hours) at 06/17/13 1208 Last data filed at 06/17/13 3086  Gross per 24 hour  Intake  717.5 ml  Output      0 ml  Net  717.5 ml   Filed Weights   06/16/13 1800 06/16/13 2131  Weight: 118.389 kg (261 lb) 123.4 kg (272 lb 0.8 oz)    Exam: General: Alert and awake, oriented x3, not in any acute  distress. HEENT: anicteric sclera, pupils reactive to light and accommodation, EOMI CVS: S1-S2 clear, no murmur rubs or gallops Chest: clear to auscultation bilaterally, no wheezing, rales or rhonchi Abdomen: soft nontender, nondistended, normal bowel sounds, no organomegaly Extremities: Left lower extremity Charcot deformity, there is ulcer in mid foot Neuro: Cranial nerves II-XII intact, no focal neurological deficits  Data Reviewed: Basic Metabolic Panel:  Recent Labs Lab 06/16/13 1753 06/17/13 0530  NA 137 139  K 3.9 4.2  CL 100 101  CO2 24 25  GLUCOSE 130* 127*  BUN 54* 57*  CREATININE 1.50* 1.73*  CALCIUM 9.5 8.9   Liver Function Tests:  Recent Labs Lab 06/16/13 1753  AST 20  ALT 17  ALKPHOS 71  BILITOT 0.6  PROT 6.9  ALBUMIN 3.4*   No results found for this basename: LIPASE, AMYLASE,  in the last 168 hours No results found for this basename: AMMONIA,  in the last 168 hours CBC:  Recent Labs Lab 06/16/13 1753 06/17/13 0530  WBC 18.1* 21.0*  NEUTROABS 15.4*  --   HGB 10.4* 9.8*  HCT 31.3* 30.1*  MCV 86.7 88.5  PLT 124* 127*   Cardiac Enzymes: No results found for this basename: CKTOTAL, CKMB, CKMBINDEX, TROPONINI,  in the last 168 hours BNP (last 3 results) No results found for this basename: PROBNP,  in the last 8760 hours CBG:  Recent Labs Lab 06/16/13 2108 06/17/13 0748 06/17/13 1147  GLUCAP 107* 138* 180*    Micro Recent Results (from the past 240 hour(s))  SURGICAL PCR SCREEN     Status:  Abnormal   Collection Time    06/16/13  9:04 PM      Result Value Range Status   MRSA, PCR NEGATIVE  NEGATIVE Final   Staphylococcus aureus POSITIVE (*) NEGATIVE Final   Comment:            The Xpert SA Assay (FDA     approved for NASAL specimens     in patients over 95 years of age),     is one component of     a comprehensive surveillance     program.  Test performance has     been validated by [employer] for patients greater     than  or equal to 57 year old.     It is not intended     to diagnose infection nor to     guide or monitor treatment.     RESULT CALLED TO, READ BACK BY AND VERIFIED WITH:     Wess Botts RN [phone] GREEN R     Studies: Dg Foot Complete Left  06/16/2013   CLINICAL DATA:  Infection.  History of left great toe amputation.  EXAM: LEFT FOOT - COMPLETE 3+ VIEW  COMPARISON:  Left great toe a radiographs 09/22/2012  FINDINGS: There has been interval amputation of the 1st ray. There is lucency/erosion in the medial cuneiform, consist with osteomyelitis. Since the radiographs of 09/22/2012, there has been loss of the normal tarsometatarsal joint spaces, with decreased mineralization hand indistinctness of the cortical margins of the tarsals and the proximal metatarsals. Subluxation of the tarsometatarsal joints cannot be excluded.  There is extensive periosteal reaction/new bone formation about the distal 2nd metatarsal, that is new from prior radiographs. The 2nd metatarsophalangeal joint space is widened and likely subluxed. There is also periosteal reaction about the distal 3rd metatarsal. There is extensive soft tissue swelling about the left foot. There are severe peripheral vascular atherosclerotic calcifications.  IMPRESSION: Findings very worrisome for diffuse osteomyelitis of the left foot, most prominent at the level of the tarsometatarsal joints. Given the disorganization tarsometatarsal joints, an associated Charcot's joint is not excluded. Osteomyelitis of at least the 2nd and 3rd metatarsals is also suspected. Probable diffuse cellulitis of the left foot given the extensive soft tissue swelling.   Electronically Signed   By: Britta Mccreedy M.D.   On: 06/16/2013 18:59    Scheduled Meds: . allopurinol  300 mg Oral Daily  . aspirin  325 mg Oral Daily  . carvedilol  3.125 mg Oral BID WC  . Chlorhexidine Gluconate Cloth  6 each Topical Daily  . [START ON 07/11/2013] cyanocobalamin  1,000 mcg  Intramuscular Q30 days  . insulin aspart  0-5 Units Subcutaneous QHS  . insulin aspart  0-9 Units Subcutaneous TID WC  . insulin aspart  5 Units Subcutaneous TID WC  . insulin glargine  42 Units Subcutaneous Daily  . insulin glargine  80 Units Subcutaneous QHS  . LORazepam  1 mg Oral BID  . mupirocin ointment  1 application Nasal BID  . piperacillin-tazobactam (ZOSYN)  IV  3.375 g Intravenous Q8H  . potassium chloride SA  20 mEq Oral Daily  . sertraline  50 mg Oral Daily  . simvastatin  40 mg Oral QHS  . torsemide  60 mg Oral Daily  . vancomycin  1,500 mg Intravenous Q24H  . [START ON 06/19/2013] Vitamin D (Ergocalciferol)  50,000 Units Oral 2 times weekly   Continuous Infusions: . sodium chloride 20 mL/hr  at 06/16/13 2103  . sodium chloride 75 mL/hr at 06/17/13 0956       Time spent: 35 minutes    [facility] A  [facility] Pager [phone] If 7PM-7AM, please contact night-coverage at [email or web address], password [facility] 06/17/2013, 12:08 PM  LOS: 1 day

## 2013-06-18 ENCOUNTER — Encounter (HOSPITAL_COMMUNITY): Payer: Self-pay | Admitting: Anesthesiology

## 2013-06-18 ENCOUNTER — Inpatient Hospital Stay (HOSPITAL_COMMUNITY): Payer: Medicare Other | Admitting: Anesthesiology

## 2013-06-18 ENCOUNTER — Encounter (HOSPITAL_COMMUNITY)
Admission: EM | Disposition: A | Discharge status: Skilled Nursing Facility | Payer: Self-pay | Source: Home / Self Care | Attending: Internal Medicine

## 2013-06-18 ENCOUNTER — Encounter (HOSPITAL_COMMUNITY): Payer: Medicare Other | Admitting: Anesthesiology

## 2013-06-18 DIAGNOSIS — D649 Anemia, unspecified: Secondary | ICD-10-CM | POA: Diagnosis not present

## 2013-06-18 DIAGNOSIS — E1165 Type 2 diabetes mellitus with hyperglycemia: Secondary | ICD-10-CM | POA: Diagnosis not present

## 2013-06-18 DIAGNOSIS — I10 Essential (primary) hypertension: Secondary | ICD-10-CM | POA: Diagnosis not present

## 2013-06-18 DIAGNOSIS — E1159 Type 2 diabetes mellitus with other circulatory complications: Secondary | ICD-10-CM | POA: Diagnosis not present

## 2013-06-18 DIAGNOSIS — D72829 Elevated white blood cell count, unspecified: Secondary | ICD-10-CM | POA: Diagnosis not present

## 2013-06-18 DIAGNOSIS — I509 Heart failure, unspecified: Secondary | ICD-10-CM | POA: Diagnosis not present

## 2013-06-18 DIAGNOSIS — L02619 Cutaneous abscess of unspecified foot: Secondary | ICD-10-CM | POA: Diagnosis not present

## 2013-06-18 DIAGNOSIS — M86179 Other acute osteomyelitis, unspecified ankle and foot: Secondary | ICD-10-CM | POA: Diagnosis not present

## 2013-06-18 DIAGNOSIS — E1149 Type 2 diabetes mellitus with other diabetic neurological complication: Secondary | ICD-10-CM | POA: Diagnosis not present

## 2013-06-18 DIAGNOSIS — M869 Osteomyelitis, unspecified: Secondary | ICD-10-CM | POA: Diagnosis not present

## 2013-06-18 DIAGNOSIS — N179 Acute kidney failure, unspecified: Secondary | ICD-10-CM | POA: Diagnosis not present

## 2013-06-18 HISTORY — PX: AMPUTATION: SHX166

## 2013-06-18 LAB — POCT I-STAT GLUCOSE
Glucose, Bld: 166 mg/dL — ABNORMAL HIGH (ref 70–99)
Operator id: 117071

## 2013-06-18 LAB — CBC
Hemoglobin: 8.9 g/dL — ABNORMAL LOW (ref 12.0–15.0)
MCH: 28.8 pg (ref 26.0–34.0)
MCHC: 32.8 g/dL (ref 30.0–36.0)
MCV: 87.7 fL (ref 78.0–100.0)
Platelets: 104 10*3/uL — ABNORMAL LOW (ref 150–400)
RBC: 3.09 MIL/uL — ABNORMAL LOW (ref 3.87–5.11)
WBC: 11.6 10*3/uL — ABNORMAL HIGH (ref 4.0–10.5)

## 2013-06-18 LAB — BASIC METABOLIC PANEL
CO2: 23 mEq/L (ref 19–32)
Calcium: 8.5 mg/dL (ref 8.4–10.5)
Chloride: 99 mEq/L (ref 96–112)
Glucose, Bld: 174 mg/dL — ABNORMAL HIGH (ref 70–99)
Potassium: 4.2 mEq/L (ref 3.5–5.1)
Sodium: 136 mEq/L (ref 135–145)

## 2013-06-18 LAB — GLUCOSE, CAPILLARY
Glucose-Capillary: 156 mg/dL — ABNORMAL HIGH (ref 70–99)
Glucose-Capillary: 158 mg/dL — ABNORMAL HIGH (ref 70–99)

## 2013-06-18 LAB — PROTIME-INR: Prothrombin Time: 18.8 seconds — ABNORMAL HIGH (ref 11.6–15.2)

## 2013-06-18 SURGERY — AMPUTATION BELOW KNEE
Anesthesia: General | Site: Leg Lower | Laterality: Left | Wound class: Contaminated

## 2013-06-18 MED ORDER — LIDOCAINE HCL (CARDIAC) 20 MG/ML IV SOLN
INTRAVENOUS | Status: DC | PRN
  Administered 2013-06-18: 80 mg via INTRAVENOUS

## 2013-06-18 MED ORDER — METOCLOPRAMIDE HCL 10 MG PO TABS: 5.0000 mg | ORAL_TABLET | Freq: Three times a day (TID) | ORAL | Status: DC | PRN

## 2013-06-18 MED ORDER — ONDANSETRON HCL 4 MG PO TABS: 4.0000 mg | ORAL_TABLET | Freq: Four times a day (QID) | ORAL | Status: DC | PRN

## 2013-06-18 MED ORDER — 0.9 % SODIUM CHLORIDE (POUR BTL) OPTIME
TOPICAL | Status: DC | PRN
  Administered 2013-06-18: 1000 mL

## 2013-06-18 MED ORDER — PROPOFOL 10 MG/ML IV BOLUS
INTRAVENOUS | Status: DC | PRN
  Administered 2013-06-18: 100 mg via INTRAVENOUS

## 2013-06-18 MED ORDER — WARFARIN SODIUM 3 MG PO TABS
3.0000 mg | ORAL_TABLET | Freq: Once | ORAL | Status: AC
  Administered 2013-06-18: 3 mg via ORAL
  Filled 2013-06-18: qty 1

## 2013-06-18 MED ORDER — WARFARIN - PHARMACIST DOSING INPATIENT
Freq: Every day | Status: DC
  Administered 2013-06-18: 18:00:00

## 2013-06-18 MED ORDER — ONDANSETRON HCL 4 MG/2ML IJ SOLN: 4.0000 mg | Freq: Once | INTRAMUSCULAR | Status: DC | PRN

## 2013-06-18 MED ORDER — OXYCODONE HCL 5 MG PO TABS
ORAL_TABLET | ORAL | Status: AC
  Filled 2013-06-18: qty 1

## 2013-06-18 MED ORDER — METOCLOPRAMIDE HCL 5 MG/ML IJ SOLN
5.0000 mg | Freq: Three times a day (TID) | INTRAMUSCULAR | Status: DC | PRN
  Filled 2013-06-18: qty 2

## 2013-06-18 MED ORDER — HYDROMORPHONE HCL PF 1 MG/ML IJ SOLN: 0.2500 mg | INTRAMUSCULAR | Status: DC | PRN

## 2013-06-18 MED ORDER — FENTANYL CITRATE 0.05 MG/ML IJ SOLN
INTRAMUSCULAR | Status: DC | PRN
  Administered 2013-06-18 (×2): 50 ug via INTRAVENOUS

## 2013-06-18 MED ORDER — ONDANSETRON HCL 4 MG/2ML IJ SOLN
4.0000 mg | Freq: Four times a day (QID) | INTRAMUSCULAR | Status: DC | PRN
  Administered 2013-06-18: 21:00:00 4 mg via INTRAVENOUS
  Filled 2013-06-18: qty 2

## 2013-06-18 SURGICAL SUPPLY — 44 items
BANDAGE ESMARK 6X9 LF (GAUZE/BANDAGES/DRESSINGS) ×1 IMPLANT
BANDAGE GAUZE ELAST BULKY 4 IN (GAUZE/BANDAGES/DRESSINGS) ×3 IMPLANT
BLADE SAW RECIP 87.9 MT (BLADE) ×2 IMPLANT
BLADE SURG 21 STRL SS (BLADE) ×2 IMPLANT
BNDG CMPR 9X6 STRL LF SNTH (GAUZE/BANDAGES/DRESSINGS) ×1
BNDG COHESIVE 6X5 TAN STRL LF (GAUZE/BANDAGES/DRESSINGS) ×3 IMPLANT
BNDG ESMARK 6X9 LF (GAUZE/BANDAGES/DRESSINGS) ×2
CLOTH BEACON ORANGE TIMEOUT ST (SAFETY) ×1 IMPLANT
COVER SURGICAL LIGHT HANDLE (MISCELLANEOUS) ×2 IMPLANT
CUFF TOURNIQUET SINGLE 34IN LL (TOURNIQUET CUFF) IMPLANT
CUFF TOURNIQUET SINGLE 44IN (TOURNIQUET CUFF) ×1 IMPLANT
DRAIN PENROSE 1/2X12 LTX STRL (WOUND CARE) IMPLANT
DRAPE EXTREMITY T 121X128X90 (DRAPE) ×2 IMPLANT
DRAPE PROXIMA HALF (DRAPES) ×4 IMPLANT
DRAPE U-SHAPE 47X51 STRL (DRAPES) ×4 IMPLANT
DRSG ADAPTIC 3X8 NADH LF (GAUZE/BANDAGES/DRESSINGS) ×2 IMPLANT
DRSG PAD ABDOMINAL 8X10 ST (GAUZE/BANDAGES/DRESSINGS) ×2 IMPLANT
DURAPREP 26ML APPLICATOR (WOUND CARE) ×2 IMPLANT
ELECT REM PT RETURN 9FT ADLT (ELECTROSURGICAL) ×2
ELECTRODE REM PT RTRN 9FT ADLT (ELECTROSURGICAL) ×1 IMPLANT
GLOVE BIOGEL PI IND STRL 9 (GLOVE) ×1 IMPLANT
GLOVE BIOGEL PI INDICATOR 9 (GLOVE) ×1
GLOVE SURG ORTHO 9.0 STRL STRW (GLOVE) ×2 IMPLANT
GOWN PREVENTION PLUS XLARGE (GOWN DISPOSABLE) ×2 IMPLANT
GOWN SRG XL XLNG 56XLVL 4 (GOWN DISPOSABLE) ×1 IMPLANT
GOWN STRL NON-REIN XL XLG LVL4 (GOWN DISPOSABLE) ×2
KIT BASIN OR (CUSTOM PROCEDURE TRAY) ×2 IMPLANT
KIT ROOM TURNOVER OR (KITS) ×2 IMPLANT
MANIFOLD NEPTUNE II (INSTRUMENTS) ×2 IMPLANT
NS IRRIG 1000ML POUR BTL (IV SOLUTION) ×2 IMPLANT
PACK GENERAL/GYN (CUSTOM PROCEDURE TRAY) ×2 IMPLANT
PAD ARMBOARD 7.5X6 YLW CONV (MISCELLANEOUS) ×4 IMPLANT
SPONGE GAUZE 4X4 12PLY (GAUZE/BANDAGES/DRESSINGS) ×2 IMPLANT
SPONGE LAP 18X18 X RAY DECT (DISPOSABLE) ×1 IMPLANT
STAPLER VISISTAT 35W (STAPLE) ×1 IMPLANT
STOCKINETTE IMPERVIOUS LG (DRAPES) ×2 IMPLANT
SUT PDS AB 1 CT  36 (SUTURE) ×2
SUT PDS AB 1 CT 36 (SUTURE) IMPLANT
SUT SILK 2 0 (SUTURE) ×2
SUT SILK 2-0 18XBRD TIE 12 (SUTURE) ×1 IMPLANT
TOWEL OR 17X24 6PK STRL BLUE (TOWEL DISPOSABLE) ×2 IMPLANT
TOWEL OR 17X26 10 PK STRL BLUE (TOWEL DISPOSABLE) ×2 IMPLANT
TUBE ANAEROBIC SPECIMEN COL (MISCELLANEOUS) IMPLANT
WATER STERILE IRR 1000ML POUR (IV SOLUTION) ×2 IMPLANT

## 2013-06-18 NOTE — Progress Notes (Signed)
ANTICOAGULATION CONSULT NOTE - Initial Consult  Pharmacy Consult for Warfarin Indication: VTE prophylaxis s/p L BKA  Allergies  Allergen Reactions  . Codeine Other (See Comments)    Causes confusion  . Meperidine Hcl Other (See Comments)    Increase in heart rate (demerol)  . Metformin Nausea And Vomiting    Patient Measurements: Height: 5\' 2"  (157.5 cm) Weight: 272 lb 0.8 oz (123.4 kg) IBW/kg (Calculated) : 50.1  Vital Signs: Temp: 97.7 F (36.5 C) (11/30 0951) Temp src: Oral (11/30 0951) BP: 108/58 mmHg (11/30 1006) Pulse Rate: 75 (11/30 0951)  Labs:  Recent Labs  06/16/13 1753 06/17/13 0530 06/18/13 0652  HGB 10.4* 9.8* 8.9*  HCT 31.3* 30.1* 27.1*  PLT 124* 127* 104*  LABPROT  --   --  18.8*  INR  --   --  1.62*  CREATININE 1.50* 1.73* 2.43*    Estimated Creatinine Clearance: 27.8 ml/min (by C-G formula based on Cr of 2.43).   Medical History: Past Medical History  Diagnosis Date  . OSA (obstructive sleep apnea)   . Thrombophlebitis of deep vein of leg   . DM (diabetes mellitus)   . CHF (congestive heart failure)     hx of EF 15-20% in 4/08,  echo 3/09 EF 60-65%;   echo 6/12: EF 55-60%, mild LAE  . Carotid stenosis     u/s 6/10: R 0-39%Ll 40-59%;  u/s 7/12: 40-59% bilateral (repeat in 7/13)  . HTN (hypertension)   . Morbid obesity   . Pulmonary HTN     mild,  cath 6/08 with PVR 5.1  . Hypokalemia   . 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 Spinal stenosis   . Obesity hypoventilation syndrome   . Cancer     hx of colon CA 2000  . Arthritis     Medications:  Scheduled:  . allopurinol  300 mg Oral Daily  . aspirin  325 mg Oral Daily  . carvedilol  3.125 mg Oral BID WC  . Chlorhexidine Gluconate Cloth  6 each Topical Daily  . [START ON 07/11/2013] cyanocobalamin  1,000 mcg Intramuscular Q30 days  . insulin aspart  0-5 Units Subcutaneous QHS  . insulin aspart  0-9 Units Subcutaneous TID WC  . insulin aspart  5 Units  Subcutaneous TID WC  . insulin glargine  42 Units Subcutaneous Daily  . insulin glargine  80 Units Subcutaneous QHS  . LORazepam  1 mg Oral BID  . mupirocin ointment  1 application Nasal BID  . oxyCODONE      . piperacillin-tazobactam (ZOSYN)  IV  3.375 g Intravenous Q8H  . sertraline  50 mg Oral Daily  . simvastatin  40 mg Oral QHS  . vancomycin  1,500 mg Intravenous Q24H  . [START ON 06/19/2013] Vitamin D (Ergocalciferol)  50,000 Units Oral 2 times weekly    Assessment: 67 yo F s/p BKA on 11/30. Pharmacy consulted to dose for VTE prophylaxis. Patient has baseline INR 1.62, wth low Hgb and plt. Renal function elevated from yesterday.   Goal of Therapy:  INR 2-3 Monitor platelets by anticoagulation protocol: Yes   Plan:  Start Coumadin 3mg  x1 Monitor daily PT/INR, CBC, and signs of bleed  Forestine Na M 06/18/2013,10:56 AM

## 2013-06-18 NOTE — Op Note (Signed)
OPERATIVE REPORT  DATE OF SURGERY: 06/18/2013  PATIENT:  [name],  67 y.o. female  PRE-OPERATIVE DIAGNOSIS:  osteomyelitis,abcess,ulceration of left foot  POST-OPERATIVE DIAGNOSIS:  osteomyelitis,abcess ulceration of left foot  PROCEDURE:  Procedure(s): AMPUTATION BELOW KNEE left  SURGEON:  Surgeon(s): Nadara Mustard, MD  ANESTHESIA:   general  EBL:  Minimal ML  SPECIMEN:  Source of Specimen:  Left leg  TOURNIQUET:   Total Tourniquet Time Documented: Thigh (Left) - 8 minutes Total: Thigh (Left) - 8 minutes   PROCEDURE DETAILS: Patient is a 67 year old woman Charcot collapse left foot diabetic insensate neuropathy with abscess ulceration and osteomyelitis of the hindfoot. Patient does not have foot salvage options available and presents for transtibial amputation. Risks and benefits were discussed including infection neurovascular injury nonhealing of the wound need for additional surgery. Patient states she understands and wished to proceed at this time. Description of procedure patient was brought to the operating room and underwent a general anesthetic. After adequate levels of anesthesia were obtained patient's left lower extremity was prepped using DuraPrep draped into a sterile field and the foot was draped out of sterile field with an impervious stockinette. A transverse incision was made 11 cm distal to the tibial tubercle this curved proximally a large posterior flap was created. The tibia was transected proximal to the skin incision the fibula was transected proximal to the tibial incision. A knife was used to create a large posterior flap. The sciatic nerve was pulled cut and allowed to retract. The vascular bundles were suture ligated with 2-0 silk. The tourniquet was deflated hemostasis was obtained. The deep and superficial fascial layers were closed using #1 PDS. The skin was closed using staples. The wound was covered with Adaptic orthopedic sponges ABDs dressing  Kerlix and Coban. Patient was extubated taken to the PACU in stable condition.  PLAN OF CARE: Admit to inpatient   PATIENT DISPOSITION:  PACU - hemodynamically stable.   Nadara Mustard, MD 06/18/2013 8:46 AM

## 2013-06-18 NOTE — Interval H&P Note (Signed)
History and Physical Interval Note:  06/18/2013 7:42 AM  [name]  has presented today for surgery, with the diagnosis of /  The various methods of treatment have been discussed with the patient and family. After consideration of risks, benefits and other options for treatment, the patient has consented to  Procedure(s): AMPUTATION BELOW KNEE (Left) as a surgical intervention .  The patient's history has been reviewed, patient examined, no change in status, stable for surgery.  I have reviewed the patient's chart and labs.  Questions were answered to the patient's satisfaction.     DUDA,[name]

## 2013-06-18 NOTE — Preoperative (Signed)
Beta Blockers   Reason not to administer Beta Blockers:received coreg at 0700, 06/18/2013

## 2013-06-18 NOTE — Anesthesia Postprocedure Evaluation (Signed)
  Anesthesia Post-op Note  Patient: [name]  Procedure(s) Performed: Procedure(s): AMPUTATION BELOW KNEE (Left)  Patient Location: PACU  Anesthesia Type:General  Level of Consciousness: awake, alert , oriented and patient cooperative  Airway and Oxygen Therapy: Patient Spontanous Breathing  Post-op Pain: mild  Post-op Assessment: Post-op Vital signs reviewed, Patient's Cardiovascular Status Stable, Respiratory Function Stable, Patent Airway, No signs of Nausea or vomiting and Pain level controlled  Post-op Vital Signs: stable  Complications: No apparent anesthesia complications

## 2013-06-18 NOTE — Anesthesia Preprocedure Evaluation (Addendum)
Anesthesia Evaluation  Patient identified by MRN, date of birth, ID band Patient awake    Reviewed: Allergy & Precautions, H&P , NPO status , Patient's Chart, lab work & pertinent test results  Airway Mallampati: I TM Distance: >3 FB Neck ROM: Full    Dental  (+) Teeth Intact and Dental Advisory Given   Pulmonary sleep apnea ,          Cardiovascular hypertension, Pt. on medications and Pt. on home beta blockers + Peripheral Vascular Disease and +CHF + dysrhythmias     Neuro/Psych  Neuromuscular disease    GI/Hepatic   Endo/Other  diabetes, Type 2, Insulin DependentMorbid obesity  Renal/GU Renal Insufficiency and ARFRenal disease     Musculoskeletal   Abdominal   Peds  Hematology  (+) anemia ,   Anesthesia Other Findings   Reproductive/Obstetrics                         Anesthesia Physical Anesthesia Plan  ASA: IV  Anesthesia Plan: General   Post-op Pain Management:    Induction: Intravenous  Airway Management Planned: Oral ETT and LMA  Additional Equipment:   Intra-op Plan:   Post-operative Plan: Extubation in OR  Informed Consent: I have reviewed the patients History and Physical, chart, labs and discussed the procedure including the risks, benefits and alternatives for the proposed anesthesia with the patient or authorized representative who has indicated his/her understanding and acceptance.     Plan Discussed with:   Anesthesia Plan Comments:         Anesthesia Quick Evaluation

## 2013-06-18 NOTE — Anesthesia Procedure Notes (Signed)
Procedure Name: LMA Insertion Date/Time: 06/18/2013 8:07 AM Performed by: Alanda Amass A Pre-anesthesia Checklist: Patient identified, Timeout performed, Emergency Drugs available, Patient being monitored and Suction available Patient Re-evaluated:Patient Re-evaluated prior to inductionOxygen Delivery Method: Circle system utilized Preoxygenation: Pre-oxygenation with 100% oxygen Intubation Type: IV induction LMA: LMA inserted LMA Size: 4.0 Number of attempts: 1 Placement Confirmation: positive ETCO2 and breath sounds checked- equal and bilateral Tube secured with: Tape Dental Injury: Teeth and Oropharynx as per pre-operative assessment

## 2013-06-18 NOTE — Progress Notes (Signed)
[facility] PROGRESS NOTE  [place] H Bodine ZOX:[id number] DOB: 1945-07-29 DOA: 06/16/2013 PCP: Ailene Ravel, MD  HPI/Subjective: Seen after she had her surgery. Sleepy but easily aroused, denies any complaints  Assessment/Plan: Principal Problem:   Osteomyelitis of foot, left, acute Active Problems:   DM   Pure hypercholesterolemia   OBSTRUCTIVE SLEEP APNEA   HYPERTENSION   Chronic diastolic heart failure   Diabetic foot ulcer   Acute on chronic renal failure   Normocytic anemia   Thrombocytopenia, unspecified   Leukocytosis, unspecified    Left foot osteomyelitis/diabetic foot ulcer -With insensate neuropathy with Charcot foot deformity. -She is on vancomycin and Zosyn. -Status post left BKA done earlier today 06/18/2013 done by Dr. Lajoyce Corners.  Diabetes mellitus type 2, uncontrolled -Hemoglobin A1c is 10.2 which correlate with the plasma glucose of 246. -Insulin sliding scale. On carbohydrate modified diet. -Probably needs Lantus dose increase prior to discharge  Chronic diastolic CHF -Patient is on torsemide carvedilol, no evidence of fluid overload. -On IV fluids, will discontinue as patient does not seems to be dehydrated. -Hold diuretics on the day of surgery.  Leukocytosis -Likely secondary to left foot osteomyelitis/diabetic foot. -Follow closely after initiation of antibiotics.  Hypertension -Patient is on carvedilol.  Code Status: Full code Family Communication: Plan discussed with the patient. Disposition Plan: Remains inpatient   Consultants:  Dr. Lajoyce Corners  Procedures:  None  Antibiotics:  Vancomycin and Zosyn   Objective: Filed Vitals:   06/18/13 1105  BP: 116/46  Pulse:   Temp:   Resp:     Intake/Output Summary (Last 24 hours) at 06/18/13 1204 Last data filed at 06/18/13 1025  Gross per 24 hour  Intake 1581.33 ml  Output    300 ml  Net 1281.33 ml   Filed Weights   06/16/13 1800 06/16/13 2131  Weight: 118.389 kg (261 lb)  123.4 kg (272 lb 0.8 oz)    Exam: General: Alert and awake, oriented x3, not in any acute distress. HEENT: anicteric sclera, pupils reactive to light and accommodation, EOMI CVS: S1-S2 clear, no murmur rubs or gallops Chest: clear to auscultation bilaterally, no wheezing, rales or rhonchi Abdomen: soft nontender, nondistended, normal bowel sounds, no organomegaly Extremities: Left lower extremity Charcot deformity, there is ulcer in mid foot Neuro: Cranial nerves II-XII intact, no focal neurological deficits  Data Reviewed: Basic Metabolic Panel:  Recent Labs Lab 06/16/13 1753 06/17/13 0530 06/18/13 0652 06/18/13 0826  NA 137 139 136  --   K 3.9 4.2 4.2  --   CL 100 101 99  --   CO2 24 25 23   --   GLUCOSE 130* 127* 174* 166*  BUN 54* 57* 65*  --   CREATININE 1.50* 1.73* 2.43*  --   CALCIUM 9.5 8.9 8.5  --    Liver Function Tests:  Recent Labs Lab 06/16/13 1753  AST 20  ALT 17  ALKPHOS 71  BILITOT 0.6  PROT 6.9  ALBUMIN 3.4*   No results found for this basename: LIPASE, AMYLASE,  in the last 168 hours No results found for this basename: AMMONIA,  in the last 168 hours CBC:  Recent Labs Lab 06/16/13 1753 06/17/13 0530 06/18/13 0652  WBC 18.1* 21.0* 11.6*  NEUTROABS 15.4*  --   --   HGB 10.4* 9.8* 8.9*  HCT 31.3* 30.1* 27.1*  MCV 86.7 88.5 87.7  PLT 124* 127* 104*   Cardiac Enzymes: No results found for this basename: CKTOTAL, CKMB, CKMBINDEX, TROPONINI,  in the last 168 hours  BNP (last 3 results) No results found for this basename: PROBNP,  in the last 8760 hours CBG:  Recent Labs Lab 06/17/13 0748 06/17/13 1147 06/17/13 1733 06/17/13 2103 06/18/13 0904  GLUCAP 138* 180* 198* 236* 147*    Micro Recent Results (from the past 240 hour(s))  SURGICAL PCR SCREEN     Status: Abnormal   Collection Time    06/16/13  9:04 PM      Result Value Range Status   MRSA, PCR NEGATIVE  NEGATIVE Final   Staphylococcus aureus POSITIVE (*) NEGATIVE Final    Comment:            The Xpert SA Assay (FDA     approved for NASAL specimens     in patients over 36 years of age),     is one component of     a comprehensive surveillance     program.  Test performance has     been validated by [facility] [employer] for patients greater     than or equal to 50 year old.     It is not intended     to diagnose infection nor to     guide or monitor treatment.     RESULT CALLED TO, READ BACK BY AND VERIFIED WITH:     Wess Botts RN [phone] [id number] GREEN R  WOUND CULTURE     Status: None   Collection Time    06/17/13 10:24 AM      Result Value Range Status   Specimen Description WOUND LEFT FOOT   Final   Special Requests Immunocompromised   Final   Gram Stain     Final   Value: ABUNDANT WBC PRESENT,BOTH PMN AND MONONUCLEAR     RARE SQUAMOUS EPITHELIAL CELLS PRESENT     ABUNDANT GRAM POSITIVE COCCI IN PAIRS     Performed at [facility]   Culture     Final   Value: Culture reincubated for better growth     Performed at [facility]   Report Status PENDING   Incomplete     Studies: Mr Foot Left Wo Contrast  06/17/2013   CLINICAL DATA:  Osteomyelitis.  Diabetes.  Fever.  Foot pain.  EXAM: MRI OF THE LEFT FOREFOOT WITHOUT CONTRAST  TECHNIQUE: Multiplanar, multisequence MR imaging was performed. No intravenous contrast was administered.  COMPARISON:  06/16/2013  FINDINGS: Severe motion artifact reduces exam sensitivity and specificity.  The middle and lateral cuneiforms are partially collapsed/ resorbed. There is proximal migration and dorsal subluxation of the metatarsal bases at the Lisfranc joint.  Prior fracture in deformity of the distal 2nd metatarsal. Abnormal low T1 signal in the base and shaft of the proximal phalanx 3rd toe.  Extensive subcutaneous and plantar muscular edema. There are erosions along the metatarsal bases.  There is a plantar skin defect below the dislocated articulation between the cuboid and 4th and 5th metatarsals with  surrounding edema shown on image 14 of series 3, favoring ulceration.  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  On coronal images there is evidence of extensor digitorum longus tenosynovitis.  IMPRESSION: 1. Extensive Charcot arthropathy in the midfoot, especially along the Lisfranc joint, which is dislocated with partial resort shin/collapse of the middle and lateral cuneiforms and with erosions along the Lisfranc joint. 2. Possible osteomyelitis of the proximal phalanx of the 3rd toe. There is significantly reduced T1 signal in the  proximal phalanx although elevated T2 signal is faint. 3. 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 4. Extensive subcutaneous and muscular edema compatible with cellulitis and myositis. Ulceration is suspected below the cuboid. 5. Prominent extensor digitorum longus tenosynovitis.   Electronically Signed   By: Herbie Baltimore M.D.   On: 06/17/2013 14:22   Dg Foot Complete Left  06/16/2013   CLINICAL DATA:  Infection.  History of left great toe amputation.  EXAM: LEFT FOOT - COMPLETE 3+ VIEW  COMPARISON:  Left great toe a radiographs 09/22/2012  FINDINGS: There has been interval amputation of the 1st ray. There is lucency/erosion in the medial cuneiform, consist with osteomyelitis. Since the radiographs of 09/22/2012, there has been loss of the normal tarsometatarsal joint spaces, with decreased mineralization hand indistinctness of the cortical margins of the tarsals and the proximal metatarsals. Subluxation of the tarsometatarsal joints cannot be excluded.  There is extensive periosteal reaction/new bone formation about the distal 2nd metatarsal, that is new from prior radiographs. The 2nd metatarsophalangeal joint space is widened and likely subluxed. There is also periosteal reaction about the distal 3rd metatarsal. There is extensive soft tissue  swelling about the left foot. There are severe peripheral vascular atherosclerotic calcifications.  IMPRESSION: Findings very worrisome for diffuse osteomyelitis of the left foot, most prominent at the level of the tarsometatarsal joints. Given the disorganization tarsometatarsal joints, an associated Charcot's joint is not excluded. Osteomyelitis of at least the 2nd and 3rd metatarsals is also suspected. Probable diffuse cellulitis of the left foot given the extensive soft tissue swelling.   Electronically Signed   By: Britta Mccreedy M.D.   On: 06/16/2013 18:59    Scheduled Meds: . allopurinol  300 mg Oral Daily  . aspirin  325 mg Oral Daily  . carvedilol  3.125 mg Oral BID WC  . Chlorhexidine Gluconate Cloth  6 each Topical Daily  . [START ON 07/11/2013] cyanocobalamin  1,000 mcg Intramuscular Q30 days  . insulin aspart  0-5 Units Subcutaneous QHS  . insulin aspart  0-9 Units Subcutaneous TID WC  . insulin aspart  5 Units Subcutaneous TID WC  . insulin glargine  42 Units Subcutaneous Daily  . insulin glargine  80 Units Subcutaneous QHS  . LORazepam  1 mg Oral BID  . mupirocin ointment  1 application Nasal BID  . oxyCODONE      . piperacillin-tazobactam (ZOSYN)  IV  3.375 g Intravenous Q8H  . sertraline  50 mg Oral Daily  . simvastatin  40 mg Oral QHS  . vancomycin  1,500 mg Intravenous Q24H  . [START ON 06/19/2013] Vitamin D (Ergocalciferol)  50,000 Units Oral 2 times weekly  . warfarin  3 mg Oral ONCE-1800  . Warfarin - Pharmacist Dosing Inpatient   Does not apply q1800   Continuous Infusions: . sodium chloride 100 mL/hr at 06/18/13 0750       Time spent: 35 minutes    [facility] A  [facility] Pager [phone] If 7PM-7AM, please contact night-coverage at [email or web address], password [facility] 06/18/2013, 12:04 PM  LOS: 2 days

## 2013-06-18 NOTE — H&P (View-Only) (Signed)
Reason for Consult: Abscess left foot Referring Physician: Dr Elmahi  [name] is an 67 y.o. female.  HPI: Patient is a 67-year-old woman with diabetic insensate neuropathy peripheral vascular disease sleep apnea renal failure with a Charcot collapse of the left foot. Patient was seen in my office about a week ago and had a stable Charcot arthropathy with no redness no cellulitis no tenderness to palpation. Patient states that yesterday she had acute onset of pain in her left foot and had a fever. Patient states that the pain was severe enough that she went to the emergency room.  Past Medical History  Diagnosis Date  . OSA (obstructive sleep apnea)   . Thrombophlebitis of deep vein of leg   . DM (diabetes mellitus)   . CHF (congestive heart failure)     hx of EF 15-20% in 4/08,  echo 3/09 EF 60-65%;   echo 6/12: EF 55-60%, mild LAE  . Carotid stenosis     u/s 6/10: R 0-39%Ll 40-59%;  u/s 7/12: 40-59% bilateral (repeat in 7/13)  . HTN (hypertension)   . Morbid obesity   . Pulmonary HTN     mild,  cath 6/08 with PVR 5.1  . Hypokalemia   . 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 Spinal stenosis   . Obesity hypoventilation syndrome   . Cancer     hx of colon CA 2000  . Arthritis     Past Surgical History  Procedure Laterality Date  . Carpal tunnel release  07/2007  . Colon cancer with resection  2000  . Cholecystectomy  2002  . Vaginal hysterectomy  1983  . Eye surgery      bilateral cataracts   . Amputation Left 10/07/2012    Procedure: AMPUTATION RAY;  Surgeon: [name] Duda, MD;  Location: [facility] OR;  Service: Orthopedics;  Laterality: Left;  Left foot 1st Ray Amputation    Family History  Problem Relation Age of Onset  . Coronary artery disease Father     Social History:  reports that she has never smoked. She has never used smokeless tobacco. She reports that she does not drink alcohol or use illicit drugs.  Allergies:  Allergies  Allergen  Reactions  . Codeine Other (See Comments)    Causes confusion  . Meperidine Hcl Other (See Comments)    Increase in heart rate (demerol)  . Metformin Nausea And Vomiting    Medications: I have reviewed the patient's current medications.  Results for orders placed during the hospital encounter of 06/16/13 (from the past 48 hour(s))  CBC WITH DIFFERENTIAL     Status: Abnormal   Collection Time    06/16/13  5:53 PM      Result Value Range   WBC 18.1 (*) 4.0 - 10.5 K/uL   RBC 3.61 (*) 3.87 - 5.11 MIL/uL   Hemoglobin 10.4 (*) 12.0 - 15.0 g/dL   HCT 31.3 (*) 36.0 - 46.0 %   MCV 86.7  78.0 - 100.0 fL   MCH 28.8  26.0 - 34.0 pg   MCHC 33.2  30.0 - 36.0 g/dL   RDW 16.6 (*) 11.5 - 15.5 %   Platelets 124 (*) 150 - 400 K/uL   Neutrophils Relative % 85 (*) 43 - 77 %   Neutro Abs 15.4 (*) 1.7 - 7.7 K/uL   Lymphocytes Relative 6 (*) 12 - 46 %   Lymphs Abs 1.1  0.7 - 4.0   K/uL   Monocytes Relative 9  3 - 12 %   Monocytes Absolute 1.6 (*) 0.1 - 1.0 K/uL   Eosinophils Relative 1  0 - 5 %   Eosinophils Absolute 0.2  0.0 - 0.7 K/uL   Basophils Relative 0  0 - 1 %   Basophils Absolute 0.0  0.0 - 0.1 K/uL  COMPREHENSIVE METABOLIC PANEL     Status: Abnormal   Collection Time    06/16/13  5:53 PM      Result Value Range   Sodium 137  135 - 145 mEq/L   Potassium 3.9  3.5 - 5.1 mEq/L   Chloride 100  96 - 112 mEq/L   CO2 24  19 - 32 mEq/L   Glucose, Bld 130 (*) 70 - 99 mg/dL   BUN 54 (*) 6 - 23 mg/dL   Creatinine, Ser 1.50 (*) 0.50 - 1.10 mg/dL   Calcium 9.5  8.4 - 10.5 mg/dL   Total Protein 6.9  6.0 - 8.3 g/dL   Albumin 3.4 (*) 3.5 - 5.2 g/dL   AST 20  0 - 37 U/L   ALT 17  0 - 35 U/L   Alkaline Phosphatase 71  39 - 117 U/L   Total Bilirubin 0.6  0.3 - 1.2 mg/dL   GFR calc non Af Amer 35 (*) >90 mL/min   GFR calc Af Amer 40 (*) >90 mL/min   Comment: (NOTE)     The eGFR has been calculated using the CKD EPI equation.     This calculation has not been validated in all clinical situations.      eGFR's persistently <90 mL/min signify possible Chronic Kidney     Disease.  SURGICAL PCR SCREEN     Status: Abnormal   Collection Time    06/16/13  9:04 PM      Result Value Range   MRSA, PCR NEGATIVE  NEGATIVE   Staphylococcus aureus POSITIVE (*) NEGATIVE   Comment:            The Xpert SA Assay (FDA     approved for NASAL specimens     in patients over 21 years of age),     is one component of     a comprehensive surveillance     program.  Test performance has     been validated by [facility] for patients greater     than or equal to 1 year old.     It is not intended     to diagnose infection nor to     guide or monitor treatment.     RESULT CALLED TO, READ BACK BY AND VERIFIED WITH:     JENNY STACKER RN [phone] GREEN R  GLUCOSE, CAPILLARY     Status: Abnormal   Collection Time    06/16/13  9:08 PM      Result Value Range   Glucose-Capillary 107 (*) 70 - 99 mg/dL   Comment 1 Documented in Chart     Comment 2 Notify RN    BASIC METABOLIC PANEL     Status: Abnormal   Collection Time    06/17/13  5:30 AM      Result Value Range   Sodium 139  135 - 145 mEq/L   Potassium 4.2  3.5 - 5.1 mEq/L   Chloride 101  96 - 112 mEq/L   CO2 25  19 - 32 mEq/L   Glucose, Bld 127 (*) 70 -   99 mg/dL   BUN 57 (*) 6 - 23 mg/dL   Creatinine, Ser 1.73 (*) 0.50 - 1.10 mg/dL   Calcium 8.9  8.4 - 10.5 mg/dL   GFR calc non Af Amer 29 (*) >90 mL/min   GFR calc Af Amer 34 (*) >90 mL/min   Comment: (NOTE)     The eGFR has been calculated using the CKD EPI equation.     This calculation has not been validated in all clinical situations.     eGFR's persistently <90 mL/min signify possible Chronic Kidney     Disease.  CBC     Status: Abnormal   Collection Time    06/17/13  5:30 AM      Result Value Range   WBC 21.0 (*) 4.0 - 10.5 K/uL   RBC 3.40 (*) 3.87 - 5.11 MIL/uL   Hemoglobin 9.8 (*) 12.0 - 15.0 g/dL   HCT 30.1 (*) 36.0 - 46.0 %   MCV 88.5  78.0 - 100.0 fL   MCH 28.8   26.0 - 34.0 pg   MCHC 32.6  30.0 - 36.0 g/dL   RDW 16.8 (*) 11.5 - 15.5 %   Platelets 127 (*) 150 - 400 K/uL  GLUCOSE, CAPILLARY     Status: Abnormal   Collection Time    06/17/13  7:48 AM      Result Value Range   Glucose-Capillary 138 (*) 70 - 99 mg/dL   Comment 1 Documented in Chart     Comment 2 Notify RN      Dg Foot Complete Left  06/16/2013   CLINICAL DATA:  Infection.  History of left great toe amputation.  EXAM: LEFT FOOT - COMPLETE 3+ VIEW  COMPARISON:  Left great toe a radiographs 09/22/2012  FINDINGS: There has been interval amputation of the 1st ray. There is lucency/erosion in the medial cuneiform, consist with osteomyelitis. Since the radiographs of 09/22/2012, there has been loss of the normal tarsometatarsal joint spaces, with decreased mineralization hand indistinctness of the cortical margins of the tarsals and the proximal metatarsals. Subluxation of the tarsometatarsal joints cannot be excluded.  There is extensive periosteal reaction/new bone formation about the distal 2nd metatarsal, that is new from prior radiographs. The 2nd metatarsophalangeal joint space is widened and likely subluxed. There is also periosteal reaction about the distal 3rd metatarsal. There is extensive soft tissue swelling about the left foot. There are severe peripheral vascular atherosclerotic calcifications.  IMPRESSION: Findings very worrisome for diffuse osteomyelitis of the left foot, most prominent at the level of the tarsometatarsal joints. Given the disorganization tarsometatarsal joints, an associated Charcot's joint is not excluded. Osteomyelitis of at least the 2nd and 3rd metatarsals is also suspected. Probable diffuse cellulitis of the left foot given the extensive soft tissue swelling.   Electronically Signed   By: Susan  Turner M.D.   On: 06/16/2013 18:59    Review of Systems  All other systems reviewed and are negative.   Blood pressure 113/65, pulse 79, temperature 98.1 F (36.7  C), temperature source Oral, resp. rate 20, height 5' 2" (1.575 m), weight 123.4 kg (272 lb 0.8 oz), SpO2 94.00%. Physical Exam On examination patient does have swelling in the left lower extremity. She has a palpable dorsalis pedis pulse. She does not have protective sensation. Patient has redness and cellulitis which would appears to be a benign hematoma beneath the rocker-bottom deformity of the Charcot collapse. After informed consent and sterile prepping the hematoma was debrided of skin and soft   tissue with a 15 blade knife. The wound area was 3 cm in diameter. There was a very superficial abscess. After debridement of the abscess patient still had fluctuance beneath the ulcer. A 15 blade knife was used to incise the area deep to the abscess and this showed a large purulent abscess. Cultures were obtained and sent. The deep abscess was debrided of skin soft tissue and muscle. The abscess probed all the way to bone and was approximately 4 cm deep there was a large cavernous area of the deep abscess. Volume approximately 4 cm in diameter. Radiograph shows Charcot arthropathy with destructive changes of her lesser toes however the lesser toes show no clinical signs of infection there is no swelling there is no redness no cellulitis there is no streaking up her legs. Assessment/Plan: Assessment: Charcot arthropathy left foot with diabetic insensate neuropathy with a large abscess of the hindfoot and osteomyelitis of the hindfoot.  Plan: Patient is scheduled for an MRI scan of her left foot today anticipate this will confirm the clinical findings of the deep osteomyelitis and abscess. Will plan for surgery tomorrow morning for a transtibial amputation of the left. Risks and benefits were discussed with the patient and her sister they state they understand and wish to proceed at this time. N.p.o. after midnight.  DUDA,[name] 06/17/2013, 10:35 AM      

## 2013-06-18 NOTE — Transfer of Care (Signed)
Immediate Anesthesia Transfer of Care Note  Patient: [name]  Procedure(s) Performed: Procedure(s): AMPUTATION BELOW KNEE (Left)  Patient Location: PACU  Anesthesia Type:General  Level of Consciousness: awake  Airway & Oxygen Therapy: Patient Spontanous Breathing and Patient connected to nasal cannula oxygen  Post-op Assessment: Report given to PACU RN and Post -op Vital signs reviewed and stable  Post vital signs: Reviewed and stable  Complications: No apparent anesthesia complications

## 2013-06-19 DIAGNOSIS — I739 Peripheral vascular disease, unspecified: Secondary | ICD-10-CM | POA: Diagnosis not present

## 2013-06-19 DIAGNOSIS — L97909 Non-pressure chronic ulcer of unspecified part of unspecified lower leg with unspecified severity: Secondary | ICD-10-CM | POA: Diagnosis not present

## 2013-06-19 DIAGNOSIS — E119 Type 2 diabetes mellitus without complications: Secondary | ICD-10-CM | POA: Diagnosis not present

## 2013-06-19 DIAGNOSIS — E1169 Type 2 diabetes mellitus with other specified complication: Secondary | ICD-10-CM | POA: Diagnosis not present

## 2013-06-19 DIAGNOSIS — M86179 Other acute osteomyelitis, unspecified ankle and foot: Secondary | ICD-10-CM | POA: Diagnosis not present

## 2013-06-19 DIAGNOSIS — I499 Cardiac arrhythmia, unspecified: Secondary | ICD-10-CM

## 2013-06-19 LAB — GLUCOSE, CAPILLARY
Glucose-Capillary: 177 mg/dL — ABNORMAL HIGH (ref 70–99)
Glucose-Capillary: 81 mg/dL (ref 70–99)
Glucose-Capillary: 77 mg/dL (ref 70–99)
Glucose-Capillary: 67 mg/dL — ABNORMAL LOW (ref 70–99)
Glucose-Capillary: 70 mg/dL (ref 70–99)
Glucose-Capillary: 76 mg/dL (ref 70–99)

## 2013-06-19 LAB — BASIC METABOLIC PANEL
CO2: 21 mEq/L (ref 19–32)
Calcium: 8.4 mg/dL (ref 8.4–10.5)
Chloride: 100 mEq/L (ref 96–112)
Creatinine, Ser: 3.08 mg/dL — ABNORMAL HIGH (ref 0.50–1.10)
GFR calc Af Amer: 17 mL/min — ABNORMAL LOW (ref >90)
Glucose, Bld: 78 mg/dL (ref 70–99)
Potassium: 4.6 mEq/L (ref 3.5–5.1)
Sodium: 136 mEq/L (ref 135–145)

## 2013-06-19 LAB — PROTIME-INR: INR: 1.48 (ref 0.00–1.49)

## 2013-06-19 MED ORDER — WARFARIN SODIUM 5 MG PO TABS
5.0000 mg | ORAL_TABLET | Freq: Once | ORAL | Status: AC
  Administered 2013-06-19: 17:00:00 5 mg via ORAL
  Filled 2013-06-19: qty 1

## 2013-06-19 MED ORDER — COUMADIN BOOK
Freq: Once | Status: AC
  Administered 2013-06-19: 11:00:00
  Filled 2013-06-19 (×2): qty 1

## 2013-06-19 MED ORDER — WARFARIN VIDEO
Freq: Once | Status: AC
  Administered 2013-06-19: 11:00:00

## 2013-06-19 NOTE — Progress Notes (Signed)
Patient ID: [name], female   DOB: October 15, 1945, 67 y.o.   MRN: [id number] Postoperative day 1 left transtibial amputation. Patient resting comfortably. Hemoglobin stable at 8.9. Renal function worsening. Patient will need discharge to skilled nursing. Okay to discontinue IV antibiotics at this time.

## 2013-06-19 NOTE — Clinical Social Work Psychosocial (Signed)
Clinical Social Work Department BRIEF PSYCHOSOCIAL ASSESSMENT 06/19/2013  Patient:  [name]     Account Number:  [id number]     Admit date:  06/16/2013  Clinical Social Worker:  Lavell Luster  Date/Time:  06/19/2013 11:00 AM  Referred by:  Physician  Date Referred:  06/19/2013 Referred for  SNF Placement   Other Referral:   Interview type:  Other - See comment Other interview type:   Patient and daughter Maxcine Ham interviewed at bedside.    PSYCHOSOCIAL DATA Living Status:  ALONE Admitted from facility:   Level of care:   Primary support name:  Maxcine Ham [phone], [phone], [phone] Primary support relationship to patient:  CHILD, ADULT Degree of support available:   Support is strong.    CURRENT CONCERNS Current Concerns  Post-Acute Placement   Other Concerns:    SOCIAL WORK ASSESSMENT / PLAN CSW met with patient at bedside to discuss recommendation for SNF placement. Patient is agreeable to SNF placement and has a preference for [facility]. Patient has daughter and son at bedside. Patient plans to return home after SNF stay. Patient has preference for private room.   Assessment/plan status:  Psychosocial Support/Ongoing Assessment of Needs Other assessment/ plan:   Complete FL2, Fax, PASRR.   Information/referral to community resources:   CSW contact information and SNF list given to patient.    PATIENT'S/FAMILY'S RESPONSE TO PLAN OF CARE: Patient is agreeable to SNF placement. Patient was pleasant and appreciative of CSW contact. Patient is hoping for [facility]. CSW will continue to follow.     [name], [place], [place], [phone]

## 2013-06-19 NOTE — Progress Notes (Signed)
ANTICOAGULATION CONSULT NOTE - Initial Consult  Pharmacy Consult for Warfarin Indication: VTE prophylaxis s/p L BKA  Allergies  Allergen Reactions  . Codeine Other (See Comments)    Causes confusion  . Meperidine Hcl Other (See Comments)    Increase in heart rate (demerol)  . Metformin Nausea And Vomiting    Patient Measurements: Height: 5\' 2"  (157.5 cm) Weight: 272 lb 0.8 oz (123.4 kg) IBW/kg (Calculated) : 50.1  Vital Signs: Temp: 97.5 F (36.4 C) (12/01 0347) Temp src: Oral (12/01 0347) BP: 177/67 mmHg (12/01 0741) Pulse Rate: 76 (12/01 0741)  Labs:  Recent Labs  06/16/13 1753 06/17/13 0530 06/18/13 0652 06/19/13 0725  HGB 10.4* 9.8* 8.9*  --   HCT 31.3* 30.1* 27.1*  --   PLT 124* 127* 104*  --   LABPROT  --   --  18.8* 17.5*  INR  --   --  1.62* 1.48  CREATININE 1.50* 1.73* 2.43* 3.08*    Estimated Creatinine Clearance: 21.9 ml/min (by C-G formula based on Cr of 3.08).   Medical History: Past Medical History  Diagnosis Date  . OSA (obstructive sleep apnea)   . Thrombophlebitis of deep vein of leg   . DM (diabetes mellitus)   . CHF (congestive heart failure)     hx of EF 15-20% in 4/08,  echo 3/09 EF 60-65%;   echo 6/12: EF 55-60%, mild LAE  . Carotid stenosis     u/s 6/10: R 0-39%Ll 40-59%;  u/s 7/12: 40-59% bilateral (repeat in 7/13)  . HTN (hypertension)   . Morbid obesity   . Pulmonary HTN     mild,  cath 6/08 with PVR 5.1  . Hypokalemia   . 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 Spinal stenosis   . Obesity hypoventilation syndrome   . Cancer     hx of colon CA 2000  . Arthritis     Medications:  Scheduled:  . allopurinol  300 mg Oral Daily  . aspirin  325 mg Oral Daily  . carvedilol  3.125 mg Oral BID WC  . Chlorhexidine Gluconate Cloth  6 each Topical Daily  . [START ON 07/11/2013] cyanocobalamin  1,000 mcg Intramuscular Q30 days  . insulin aspart  0-5 Units Subcutaneous QHS  . insulin aspart  0-9 Units  Subcutaneous TID WC  . insulin aspart  5 Units Subcutaneous TID WC  . insulin glargine  42 Units Subcutaneous Daily  . insulin glargine  80 Units Subcutaneous QHS  . LORazepam  1 mg Oral BID  . mupirocin ointment  1 application Nasal BID  . piperacillin-tazobactam (ZOSYN)  IV  3.375 g Intravenous Q8H  . sertraline  50 mg Oral Daily  . simvastatin  40 mg Oral QHS  . vancomycin  1,500 mg Intravenous Q24H  . Vitamin D (Ergocalciferol)  50,000 Units Oral 2 times weekly  . Warfarin - Pharmacist Dosing Inpatient   Does not apply q1800    Assessment: 67 yo F s/p BKA on 11/30. Pharmacy consulted to dose for VTE prophylaxis. Patient has baseline INR 1.62. INR 1.48 today.  Scr cont to trend up.   Goal of Therapy:  INR 2-3 Monitor platelets by anticoagulation protocol: Yes   Plan:   Coumadin 5mg  PO x1 Monitor daily PT/INR, CBC, and signs of bleed Ortho's note said ok to dc abx

## 2013-06-19 NOTE — Clinical Social Work Placement (Signed)
Clinical Social Work Department CLINICAL SOCIAL WORK PLACEMENT NOTE 06/19/2013  Patient:  [name], [name]  Account Number:  [id number] Admit date:  06/16/2013  Clinical Social Worker:  Cherre Blanc, [place]  Date/time:  06/19/2013 11:30 AM  Clinical Social Work is seeking post-discharge placement for this patient at the following level of care:   SKILLED NURSING   (*CSW will update this form in Epic as items are completed)   06/19/2013  Patient/family provided with [facility] Department of Clinical Social Work's list of facilities offering this level of care within the geographic area requested by the patient (or if unable, by the patient's family).  06/19/2013  Patient/family informed of their freedom to choose among providers that offer the needed level of care, that participate in Medicare, Medicaid or managed care program needed by the patient, have an available bed and are willing to accept the patient.  06/19/2013  Patient/family informed of [facility]' ownership interest in [facility], as well as of the fact that they are under no obligation to receive care at this facility.  PASARR submitted to EDS on 06/19/2013 PASARR number received from EDS on 06/19/2013  FL2 transmitted to all facilities in geographic area requested by pt/family on  06/19/2013 FL2 transmitted to all facilities within larger geographic area on   Patient informed that his/her managed care company has contracts with or will negotiate with  certain facilities, including the following:     Patient/family informed of bed offers received:  06/19/2013 Patient chooses bed at OTHER Physician recommends and patient chooses bed at    Patient to be transferred to OTHER on   Patient to be transferred to facility by   The following physician request were entered in Epic:   Additional Comments: Patient chooses bed at [facility].   Roddie Mc, [place], [place], [phone]

## 2013-06-19 NOTE — Progress Notes (Signed)
Patient continues to refuse the CPAP machine. Patient is aware to call RT if she does change her mind about wearing the machine. The CPAP is set up at patient bedside, ready to use if needed. RT will continue to assist as needed.

## 2013-06-19 NOTE — Evaluation (Addendum)
Occupational Therapy Evaluation Patient Details Name: [name] MRN: [id number] DOB: August 06, 1945 Today's Date: 06/19/2013 Time: 4132-4401 OT Time Calculation (min): 47 min  OT Assessment / Plan / Recommendation History of present illness Patient is a 67 year old woman with dx diabetic insensate neuropathy peripheral vascular disease sleep apnea renal failure with a Charcot collapse of the left foot, now s/p L BKA.   Clinical Impression   Pt admitted s/p L BKA now impacting her ability to perform ADL's and self care tasks & transfers. Pt very lethargic (?due to medications - see problem list below) & requiring +2 total assist, pt 10%. Will follow acutely to assist in maximizing independence with ADL's prior to SNF Rehab.    OT Assessment  Patient needs continued OT Services    Follow Up Recommendations  SNF;Supervision/Assistance - 24 hour    Barriers to Discharge      Equipment Recommendations  Other (comment) (Defer to next venue of care)    Recommendations for Other Services    Frequency  Min 2X/week    Precautions / Restrictions Precautions Precautions: Fall Restrictions Weight Bearing Restrictions: Yes LLE Weight Bearing: Non weight bearing   Pertinent Vitals/Pain No, denies pain. "Groggy" keeping eyes closed unless given mod-max vc's/tc's to open.    ADL  Grooming: Performed;Wash/dry hands;Wash/dry face;Set up;Min guard Where Assessed - Grooming: Supported sitting Upper Body Bathing: Simulated;Min guard Where Assessed - Upper Body Bathing: Supported sitting Lower Body Bathing: +2 Total assistance;Performed Lower Body Bathing: Patient Percentage: 10% Where Assessed - Lower Body Bathing: Supported sit to stand Upper Body Dressing: Simulated;Minimal assistance Where Assessed - Upper Body Dressing: Supported sitting Lower Body Dressing: Simulated;+1 Total assistance;+2 Total assistance Lower Body Dressing: Patient Percentage: 10% Where Assessed - Lower Body  Dressing: Rolling right and/or left;Supported sit to stand Toilet Transfer: Performed;+2 Total assistance Toilet Transfer: Patient Percentage: 10% Toilet Transfer Method: Stand pivot;Sit to Barista: Materials engineer and Hygiene: Performed;+2 Total assistance Toileting - Architect and Hygiene: Patient Percentage: 10% Where Assessed - Toileting Clothing Manipulation and Hygiene: Sit to stand from 3-in-1 or toilet Tub/Shower Transfer Method: Not assessed Equipment Used: Gait belt;Rolling walker Transfers/Ambulation Related to ADLs: Pt was +2 total assist for initial sit to stand from EOB pt 40%. She then requested using 3:1 was noted to be +2-3 total assist pt 10%. Very lethargic (?medsications), difficulty following commands, required maximal verbal and tactile cues, thrid person assisted for sit to stand from 3:1 w/ lift pad placed in chair, RN made aware of need to use life equipment for transfers at this time. ADL Comments: Pt was educted in role of OT and recommendations. She participated in ADL's today for toileting, grooming and functional transfers. She is lethargic, groggy and had significant difficulty following commands ?due to medications. She plans to d/c to SNF Rehab after acute stay.    OT Diagnosis: Generalized weakness;Other (comment) (S/P L BKA)  OT Problem List: Decreased strength;Decreased activity tolerance;Impaired balance (sitting and/or standing);Decreased knowledge of precautions;Decreased knowledge of use of DME or AE;Decreased safety awareness OT Treatment Interventions: Self-care/ADL training;DME and/or AE instruction;Patient/family education;Therapeutic activities;Balance training   OT Goals(Current goals can be found in the care plan section) Acute Rehab OT Goals Patient Stated Goal: Rehab in [place], [place], near my home. Time For Goal Achievement: 07/03/13 Potential to Achieve Goals: Good  Visit  Information  Last OT Received On: 06/19/13 Assistance Needed: +2 History of Present Illness: Patient is a 67 year old woman with dx diabetic insensate  neuropathy peripheral vascular disease sleep apnea renal failure with a Charcot collapse of the left foot, now s/p L BKA.       Prior Functioning     Home Living Family/patient expects to be discharged to:: Skilled nursing facility Living Arrangements: Spouse/significant other Prior Function Level of Independence: Needs assistance Gait / Transfers Assistance Needed: Dan Humphreys and cane ADL's / Homemaking Assistance Needed: Aide for assist every morning Communication Communication: No difficulties Dominant Hand: Right    Vision/Perception Vision - Assessment Vision Assessment: Vision not tested   Cognition  Cognition Arousal/Alertness: Lethargic;Suspect due to medications Behavior During Therapy: [facility] for tasks assessed/performed Overall Cognitive Status: Within Functional Limits for tasks assessed    Extremity/Trunk Assessment Upper Extremity Assessment Upper Extremity Assessment: Generalized weakness Lower Extremity Assessment Lower Extremity Assessment: Defer to PT evaluation    Mobility Bed Mobility Bed Mobility: Right Sidelying to Sit;Sitting - Scoot to Edge of Bed Right Sidelying to Sit: 2: Max assist;3: Mod assist;HOB elevated Sitting - Scoot to Edge of Bed: 3: Mod assist;With rail Details for Bed Mobility Assistance: Pt required vc's/tc's for safety, sequencing and hand placement during bed mobility.  Transfers Transfers: Sit to Stand;Stand to Sit Sit to Stand: 1: +2 Total assist;With upper extremity assist;With armrests;From bed;From chair/3-in-1 Sit to Stand: Patient Percentage: 10% Stand to Sit: 1: +2 Total assist;To chair/3-in-1 Stand to Sit: Patient Percentage: 10% Transfer via Lift Equipment:  (Pt will need lift for transfer back to bed, lift pad placed behind pt. in chair, RN staff made aware.) Details for  Transfer Assistance: Initial sit to stand from EOB w/ +2 total assist, pt 40%. After sitting on 3:1, attempted sit to stand x4, third person was needed to remove commode and bring recliner chair behind pt. Pt w/ maximal vc's/tc's for safety, sequencing and hand placement. She was very lethargic, ?due to medications        Balance Balance Balance Assessed: Yes Static Sitting Balance Static Sitting - Balance Support: Bilateral upper extremity supported (R LE supported, Contact guard assist to Mod Assist secondary to fatigue/lethargy)   End of Session OT - End of Session Equipment Utilized During Treatment: Gait belt;Rolling walker;Oxygen;Other (comment) (3:1, lift pad for transfer back to bed after sitting up in chair) Activity Tolerance: Patient limited by fatigue;Patient limited by lethargy Patient left: in chair;with call bell/phone within reach;with nursing/sitter in room;with family/visitor present Nurse Communication: Mobility status;Need for lift equipment  GO     Alm Bustard 06/19/2013, 10:50 AM

## 2013-06-19 NOTE — Progress Notes (Signed)
RT Note:  Pt refused CPAP at this time.  Pt. Encouraged to call for Respiratory if she wishes to wear CPAP. RN aware.

## 2013-06-19 NOTE — Progress Notes (Signed)
Hypoglycemic Event  CBG: 70  Treatment: 15 GM carbohydrate snack  Symptoms: None  Follow-up CBG: Time:5:05 pm Followed up again at 5:25pm  Possible Reasons for Event: Inadequate meal intake  Comments/MD notified:NO     [name]  Remember to initiate Hypoglycemia Order Set & complete

## 2013-06-19 NOTE — Evaluation (Signed)
Physical Therapy Evaluation Patient Details Name: [name] MRN: [id number] DOB: 18-May-1946 Today's Date: 06/19/2013 Time: 1191-4782 PT Time Calculation (min): 52 min  PT Assessment / Plan / Recommendation History of Present Illness  Patient is a 67 year old woman with dx diabetic insensate neuropathy peripheral vascular disease sleep apnea renal failure with a Charcot collapse of the left foot, now s/p L BKA.  Clinical Impression  This patient presents with acute pain and decreased functional independence following the above mentioned procedure. At the time of PT eval, pt was very lethargic and had difficulty staying awake during the session. Per nursing, pt was lethargic prior to the surgery, but has been intensified with pain medication. Pt fatigued very quickly and a third person was needed to manage chairs/equipment as +2 assist was needed for static standing at the [facility]. This patient is appropriate for skilled PT interventions to address functional limitations, improve safety and independence with functional mobility, and return to PLOF.     PT Assessment  Patient needs continued PT services    Follow Up Recommendations  SNF    Does the patient have the potential to tolerate intense rehabilitation      Barriers to Discharge        Equipment Recommendations  None recommended by PT    Recommendations for Other Services     Frequency Min 2X/week    Precautions / Restrictions Precautions Precautions: Fall Restrictions Weight Bearing Restrictions: Yes LLE Weight Bearing: Non weight bearing   Pertinent Vitals/Pain Pt reports minimal pain throughout session, and no SOB.      Mobility  Bed Mobility Bed Mobility: Right Sidelying to Sit;Sitting - Scoot to Edge of Bed Right Sidelying to Sit: 2: Max assist;3: Mod assist;HOB elevated Right Sidelying to Sit: Patient Percentage: 30% Sitting - Scoot to Edge of Bed: 3: Mod assist;With rail Details for Bed Mobility Assistance:  Pt required vc's/tc's for safety, sequencing and hand placement during bed mobility.  Transfers Transfers: Sit to Stand;Stand to Sit;Stand Pivot Transfers Sit to Stand: 1: +2 Total assist;With upper extremity assist;With armrests;From bed;From chair/3-in-1 Sit to Stand: Patient Percentage: 10% Stand to Sit: 1: +2 Total assist;To chair/3-in-1 Stand to Sit: Patient Percentage: 10% Stand Pivot Transfers: 1: +2 Total assist Stand Pivot Transfers: Patient Percentage: 30% Transfer via Lift Equipment:  (Pt will need lift for transfer back to bed, lift pad placed behind pt. in chair, RN staff made aware.) Details for Transfer Assistance: Initial sit to stand from EOB w/ +2 total assist, pt 40%. After sitting on 3:1, attempted sit to stand x4, third person was needed to remove commode and bring recliner chair behind pt. Pt w/ maximal vc's/tc's for safety, sequencing and hand placement. She was very lethargic, ?due to medications Ambulation/Gait Ambulation/Gait Assistance: Not tested (comment) Ambulation/Gait Assistance Details: Pt unable at this time     Exercises     PT Diagnosis: Difficulty walking;Acute pain  PT Problem List: Decreased strength;Decreased range of motion;Decreased activity tolerance;Decreased balance;Decreased mobility;Decreased knowledge of use of DME;Decreased safety awareness;Decreased knowledge of precautions;Pain PT Treatment Interventions: DME instruction;Gait training;Functional mobility training;Therapeutic activities;Therapeutic exercise;Neuromuscular re-education;Patient/family education     PT Goals(Current goals can be found in the care plan section) Acute Rehab PT Goals Patient Stated Goal: Rehab in [place], [place], near my home. PT Goal Formulation: With patient Time For Goal Achievement: 06/26/13 Potential to Achieve Goals: Fair  Visit Information  Last PT Received On: 06/19/13 Assistance Needed: +3 or more PT/OT Co-Evaluation/Treatment: Yes History of Present  Illness: Patient  is a 67 year old woman with dx diabetic insensate neuropathy peripheral vascular disease sleep apnea renal failure with a Charcot collapse of the left foot, now s/p L BKA.       Prior Functioning  Home Living Family/patient expects to be discharged to:: Skilled nursing facility Living Arrangements: Spouse/significant other Prior Function Level of Independence: Needs assistance Gait / Transfers Assistance Needed: Dan Humphreys and cane ADL's / Homemaking Assistance Needed: Aide for assist every morning Communication Communication: No difficulties Dominant Hand: Right    Cognition  Cognition Arousal/Alertness: Lethargic;Suspect due to medications Behavior During Therapy: [facility] for tasks assessed/performed Overall Cognitive Status: Within Functional Limits for tasks assessed    Extremity/Trunk Assessment Upper Extremity Assessment Upper Extremity Assessment: Generalized weakness Lower Extremity Assessment Lower Extremity Assessment: Defer to PT evaluation   Balance Balance Balance Assessed: Yes Static Sitting Balance Static Sitting - Balance Support: Bilateral upper extremity supported (R LE supported, Contact guard assist to Mod Assist secondary to fatigue/lethargy) Static Sitting - Level of Assistance: 4: Min Oncologist Standing - Balance Support: Bilateral upper extremity supported Static Standing - Level of Assistance: 3: Mod assist  End of Session PT - End of Session Equipment Utilized During Treatment: Gait belt Activity Tolerance: Patient limited by fatigue;Patient limited by lethargy Patient left: in chair;with call bell/phone within reach;with nursing/sitter in room Nurse Communication: Mobility status;Need for lift equipment  GP     Ruthann Cancer 06/19/2013, 11:59 AM  Ruthann Cancer, PT, DPT [phone]

## 2013-06-19 NOTE — Progress Notes (Signed)
[facility] PROGRESS NOTE  [place] H Kerwood ZOX:[id number] DOB: 23-Nov-1945 DOA: 06/16/2013 PCP: Ailene Ravel, MD  HPI/Subjective: She is ready to be discharged and feels better today.  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 Lajoyce Corners -She needs a PT consult and SNF placement  Acute on chronic renal failure -Patient has CKD stage III with baseline creatinine of 1.5. -Has progressive worsening of her creatinine since admission. Patient is on IV fluids. -Check urine studies, continue IV fluids.  Diabetes Mellitus type 2, uncontrolled -Hemoglobin A1c is 10.2 -recent plasma glucose is 78 due to insulin administration at breakfast -Sliding scale insulin. Carbohydrate modified diet -Lantus dose increase before d.c  Chronic diastolic CHF -taking torsemide carvediolol -d/c IV fluids  Leukocytosis -secondary to left foot osteomyelitis/diabetic foot -trending down to normal-yesterday 11.6  Hypertension -patient is on carvedilol  Code Status: full Family Communication: Plan discussed with patient. No family in the room Disposition Plan: inpatient   Consultants:  Dr. Phil Dopp  PT  Procedures:  none  Antibiotics:  Vancomycin and Zosyn    Objective: Filed Vitals:   06/19/13 0741  BP: 177/67  Pulse: 76  Temp:   Resp:     Intake/Output Summary (Last 24 hours) at 06/19/13 1027 Last data filed at 06/19/13 0900  Gross per 24 hour  Intake 1987.17 ml  Output      0 ml  Net 1987.17 ml   Filed Weights   06/16/13 1800 06/16/13 2131  Weight: 118.389 kg (261 lb) 123.4 kg (272 lb 0.8 oz)    Exam:   General: well developed well nourished  Cardiovascular: S1 and S2 no murmurs, rubs, gallops  Respiratory: clear to auscultation no wheezes, rales, or rhonchi  Abdomen: soft non-tender,  non-distended, + BS  Musculoskeletal: left leg amputation with edema without erythema.  Data Reviewed: Basic Metabolic Panel:  Recent Labs Lab 06/16/13 1753 06/17/13 0530 06/18/13 0652 06/18/13 0826 06/19/13 0725  NA 137 139 136  --  136  K 3.9 4.2 4.2  --  4.6  CL 100 101 99  --  100  CO2 24 25 23   --  21  GLUCOSE 130* 127* 174* 166* 78  BUN 54* 57* 65*  --  73*  CREATININE 1.50* 1.73* 2.43*  --  3.08*  CALCIUM 9.5 8.9 8.5  --  8.4   Liver Function Tests:  Recent Labs Lab 06/16/13 1753  AST 20  ALT 17  ALKPHOS 71  BILITOT 0.6  PROT 6.9  ALBUMIN 3.4*   CBC:  Recent Labs Lab 06/16/13 1753 06/17/13 0530 06/18/13 0652  WBC 18.1* 21.0* 11.6*  NEUTROABS 15.4*  --   --   HGB 10.4* 9.8* 8.9*  HCT 31.3* 30.1* 27.1*  MCV 86.7 88.5 87.7  PLT 124* 127* 104*   CBG:  Recent Labs Lab 06/18/13 1008 06/18/13 1144 06/18/13 1652 06/18/13 2117 06/19/13 0738  GLUCAP 156* 158* 141* 177* 81    Recent Results (from the past 240 hour(s))  SURGICAL PCR SCREEN     Status: Abnormal   Collection Time    06/16/13  9:04 PM      Result Value Range Status   MRSA, PCR NEGATIVE  NEGATIVE Final   Staphylococcus aureus POSITIVE (*) NEGATIVE Final   Comment:            The Xpert SA Assay (FDA     approved for  NASAL specimens     in patients over 22 years of age),     is one component of     a comprehensive surveillance     program.  Test performance has     been validated by [employer] for patients greater     than or equal to 66 year old.     It is not intended     to diagnose infection nor to     guide or monitor treatment.     RESULT CALLED TO, READ BACK BY AND VERIFIED WITH:     Wess Botts RN [phone] [id number] GREEN R  WOUND CULTURE     Status: None   Collection Time    06/17/13 10:24 AM      Result Value Range Status   Specimen Description WOUND LEFT FOOT   Final   Special Requests Immunocompromised   Final   Gram Stain     Final   Value: ABUNDANT WBC  PRESENT,BOTH PMN AND MONONUCLEAR     RARE SQUAMOUS EPITHELIAL CELLS PRESENT     ABUNDANT GRAM POSITIVE COCCI IN PAIRS     Performed at [facility]   Culture     Final   Value: ABUNDANT STAPHYLOCOCCUS AUREUS     Note: RIFAMPIN AND GENTAMICIN SHOULD NOT BE USED AS SINGLE DRUGS FOR TREATMENT OF STAPH INFECTIONS.     Performed at [facility]   Report Status PENDING   Incomplete     Studies: Mr Foot Left Wo Contrast  06/17/2013   CLINICAL DATA:  Osteomyelitis.  Diabetes.  Fever.  Foot pain.  EXAM: MRI OF THE LEFT FOREFOOT WITHOUT CONTRAST  TECHNIQUE: Multiplanar, multisequence MR imaging was performed. No intravenous contrast was administered.  COMPARISON:  06/16/2013  FINDINGS: Severe motion artifact reduces exam sensitivity and specificity.  The middle and lateral cuneiforms are partially collapsed/ resorbed. There is proximal migration and dorsal subluxation of the metatarsal bases at the Lisfranc joint.  Prior fracture in deformity of the distal 2nd metatarsal. Abnormal low T1 signal in the base and shaft of the proximal phalanx 3rd toe.  Extensive subcutaneous and plantar muscular edema. There are erosions along the metatarsal bases.  There is a plantar skin defect below the dislocated articulation between the cuboid and 4th and 5th metatarsals with surrounding edema shown on image 14 of series 3, favoring ulceration.  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  On coronal images there is evidence of extensor digitorum longus tenosynovitis.  IMPRESSION: 1. Extensive Charcot arthropathy in the midfoot, especially along the Lisfranc joint, which is dislocated with partial resort shin/collapse of the middle and lateral cuneiforms and with erosions along the Lisfranc joint. 2. Possible osteomyelitis of the proximal phalanx of the 3rd toe. There is significantly reduced T1 signal in the proximal phalanx although elevated T2 signal  is faint. 3. 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 4. Extensive subcutaneous and muscular edema compatible with cellulitis and myositis. Ulceration is suspected below the cuboid. 5. Prominent extensor digitorum longus tenosynovitis.   Electronically Signed   By: Herbie Baltimore M.D.   On: 06/17/2013 14:22    Scheduled Meds: . allopurinol  300 mg Oral Daily  . aspirin  325 mg Oral Daily  . carvedilol  3.125 mg Oral BID WC  . Chlorhexidine Gluconate Cloth  6 each Topical  Daily  . [START ON 07/11/2013] cyanocobalamin  1,000 mcg Intramuscular Q30 days  . insulin aspart  0-5 Units Subcutaneous QHS  . insulin aspart  0-9 Units Subcutaneous TID WC  . insulin aspart  5 Units Subcutaneous TID WC  . insulin glargine  42 Units Subcutaneous Daily  . insulin glargine  80 Units Subcutaneous QHS  . LORazepam  1 mg Oral BID  . mupirocin ointment  1 application Nasal BID  . sertraline  50 mg Oral Daily  . simvastatin  40 mg Oral QHS  . Vitamin D (Ergocalciferol)  50,000 Units Oral 2 times weekly  . warfarin  5 mg Oral ONCE-1800  . Warfarin - Pharmacist Dosing Inpatient   Does not apply q1800   Continuous Infusions: . sodium chloride 10 mL/hr at 06/18/13 2358    Principal Problem:   Osteomyelitis of foot, left, acute Active Problems:   DM   Pure hypercholesterolemia   OBSTRUCTIVE SLEEP APNEA   HYPERTENSION   Chronic diastolic heart failure   Diabetic foot ulcer   Acute on chronic renal failure   Normocytic anemia   Thrombocytopenia, unspecified   Leukocytosis, unspecified    Time spent: 71    Jari Favre PA-S  [facility] Pager [phone]. If 7PM-7AM, please contact night-coverage at [email or web address], password [facility] 06/19/2013, 10:27 AM  LOS: 3 days      Addendum  Patient seen and examined, chart and data base reviewed.  I agree with the above assessment and plan.  For full details please see Ms.  Jari Favre PA-S note.   Clint Lipps, MD [facility] Pager: [phone] 06/19/2013, 12:41 PM

## 2013-06-20 ENCOUNTER — Encounter (HOSPITAL_COMMUNITY): Payer: Self-pay | Admitting: Orthopedic Surgery

## 2013-06-20 DIAGNOSIS — E118 Type 2 diabetes mellitus with unspecified complications: Secondary | ICD-10-CM | POA: Diagnosis not present

## 2013-06-20 DIAGNOSIS — Z5181 Encounter for therapeutic drug level monitoring: Secondary | ICD-10-CM | POA: Diagnosis not present

## 2013-06-20 DIAGNOSIS — M86179 Other acute osteomyelitis, unspecified ankle and foot: Secondary | ICD-10-CM | POA: Diagnosis not present

## 2013-06-20 DIAGNOSIS — Z7901 Long term (current) use of anticoagulants: Secondary | ICD-10-CM | POA: Diagnosis not present

## 2013-06-20 DIAGNOSIS — M6250 Muscle wasting and atrophy, not elsewhere classified, unspecified site: Secondary | ICD-10-CM | POA: Diagnosis not present

## 2013-06-20 DIAGNOSIS — H43399 Other vitreous opacities, unspecified eye: Secondary | ICD-10-CM | POA: Diagnosis not present

## 2013-06-20 DIAGNOSIS — I10 Essential (primary) hypertension: Secondary | ICD-10-CM | POA: Diagnosis not present

## 2013-06-20 DIAGNOSIS — E1169 Type 2 diabetes mellitus with other specified complication: Secondary | ICD-10-CM | POA: Diagnosis not present

## 2013-06-20 DIAGNOSIS — N181 Chronic kidney disease, stage 1: Secondary | ICD-10-CM | POA: Diagnosis not present

## 2013-06-20 DIAGNOSIS — R269 Unspecified abnormalities of gait and mobility: Secondary | ICD-10-CM | POA: Diagnosis not present

## 2013-06-20 DIAGNOSIS — R262 Difficulty in walking, not elsewhere classified: Secondary | ICD-10-CM | POA: Diagnosis not present

## 2013-06-20 DIAGNOSIS — N390 Urinary tract infection, site not specified: Secondary | ICD-10-CM | POA: Diagnosis not present

## 2013-06-20 DIAGNOSIS — M86679 Other chronic osteomyelitis, unspecified ankle and foot: Secondary | ICD-10-CM | POA: Diagnosis not present

## 2013-06-20 DIAGNOSIS — I5030 Unspecified diastolic (congestive) heart failure: Secondary | ICD-10-CM | POA: Diagnosis not present

## 2013-06-20 DIAGNOSIS — E1129 Type 2 diabetes mellitus with other diabetic kidney complication: Secondary | ICD-10-CM | POA: Diagnosis not present

## 2013-06-20 DIAGNOSIS — M6281 Muscle weakness (generalized): Secondary | ICD-10-CM | POA: Diagnosis not present

## 2013-06-20 DIAGNOSIS — D72829 Elevated white blood cell count, unspecified: Secondary | ICD-10-CM | POA: Diagnosis not present

## 2013-06-20 DIAGNOSIS — N179 Acute kidney failure, unspecified: Secondary | ICD-10-CM | POA: Diagnosis not present

## 2013-06-20 DIAGNOSIS — M25569 Pain in unspecified knee: Secondary | ICD-10-CM | POA: Diagnosis not present

## 2013-06-20 DIAGNOSIS — E119 Type 2 diabetes mellitus without complications: Secondary | ICD-10-CM | POA: Diagnosis not present

## 2013-06-20 DIAGNOSIS — R489 Unspecified symbolic dysfunctions: Secondary | ICD-10-CM | POA: Diagnosis not present

## 2013-06-20 DIAGNOSIS — S88119A Complete traumatic amputation at level between knee and ankle, unspecified lower leg, initial encounter: Secondary | ICD-10-CM | POA: Diagnosis not present

## 2013-06-20 DIAGNOSIS — D649 Anemia, unspecified: Secondary | ICD-10-CM | POA: Diagnosis not present

## 2013-06-20 DIAGNOSIS — L97209 Non-pressure chronic ulcer of unspecified calf with unspecified severity: Secondary | ICD-10-CM | POA: Diagnosis not present

## 2013-06-20 DIAGNOSIS — G4730 Sleep apnea, unspecified: Secondary | ICD-10-CM | POA: Diagnosis not present

## 2013-06-20 DIAGNOSIS — I83219 Varicose veins of right lower extremity with both ulcer of unspecified site and inflammation: Secondary | ICD-10-CM | POA: Diagnosis not present

## 2013-06-20 DIAGNOSIS — E1149 Type 2 diabetes mellitus with other diabetic neurological complication: Secondary | ICD-10-CM | POA: Diagnosis not present

## 2013-06-20 LAB — BASIC METABOLIC PANEL
BUN: 80 mg/dL — ABNORMAL HIGH (ref 6–23)
CO2: 20 mEq/L (ref 19–32)
Calcium: 8.5 mg/dL (ref 8.4–10.5)
Creatinine, Ser: 3.12 mg/dL — ABNORMAL HIGH (ref 0.50–1.10)
GFR calc Af Amer: 17 mL/min — ABNORMAL LOW (ref >90)
GFR calc non Af Amer: 14 mL/min — ABNORMAL LOW (ref >90)
Glucose, Bld: 51 mg/dL — ABNORMAL LOW (ref 70–99)
Potassium: 4.3 mEq/L (ref 3.5–5.1)

## 2013-06-20 LAB — PROTIME-INR: INR: 1.45 (ref 0.00–1.49)

## 2013-06-20 LAB — WOUND CULTURE

## 2013-06-20 LAB — CREATININE, URINE, RANDOM: Creatinine, Urine: 133.84 mg/dL

## 2013-06-20 LAB — GLUCOSE, CAPILLARY
Glucose-Capillary: 81 mg/dL (ref 70–99)
Glucose-Capillary: 60 mg/dL — ABNORMAL LOW (ref 70–99)
Glucose-Capillary: 75 mg/dL (ref 70–99)

## 2013-06-20 LAB — CBC
Hemoglobin: 8.5 g/dL — ABNORMAL LOW (ref 12.0–15.0)
MCH: 28 pg (ref 26.0–34.0)
MCHC: 31.8 g/dL (ref 30.0–36.0)
MCV: 87.8 fL (ref 78.0–100.0)
Platelets: 121 10*3/uL — ABNORMAL LOW (ref 150–400)
RDW: 17.1 % — ABNORMAL HIGH (ref 11.5–15.5)

## 2013-06-20 LAB — SODIUM, URINE, RANDOM: Sodium, Ur: 16 mEq/L

## 2013-06-20 MED ORDER — GLUCOSE 40 % PO GEL: 1.0000 | ORAL | Status: DC | PRN

## 2013-06-20 MED ORDER — INSULIN GLARGINE 100 UNIT/ML ~~LOC~~ SOLN: SUBCUTANEOUS | Status: DC

## 2013-06-20 MED ORDER — FUROSEMIDE 10 MG/ML IJ SOLN
80.0000 mg | Freq: Once | INTRAMUSCULAR | Status: AC
  Administered 2013-06-20: 11:00:00 80 mg via INTRAVENOUS
  Filled 2013-06-20: qty 8

## 2013-06-20 MED ORDER — DEXTROSE 50 % IV SOLN
INTRAVENOUS | Status: AC
  Filled 2013-06-20: qty 50

## 2013-06-20 MED ORDER — DOXYCYCLINE HYCLATE 100 MG PO CAPS: 100.0000 mg | ORAL_CAPSULE | Freq: Two times a day (BID) | ORAL | Status: DC

## 2013-06-20 MED ORDER — DEXTROSE 50 % IV SOLN
25.0000 mL | Freq: Once | INTRAVENOUS | Status: AC | PRN
  Administered 2013-06-20: 05:00:00 25 mL via INTRAVENOUS

## 2013-06-20 NOTE — Discharge Summary (Signed)
Physician Discharge Summary  [name] UJW:[id number] DOB: 24-Oct-1945 DOA: 06/16/2013  PCP: Ailene Ravel, MD  Admit date: 06/16/2013 Discharge date: 06/20/2013  Time spent: 40 minutes  Recommendations for Outpatient Follow-up:  1. Followup with primary care physician in one week. 2. Followup with Dr. Lajoyce Corners in 2 weeks.  Discharge Diagnoses:  Principal Problem:   Osteomyelitis of foot, left, acute Active Problems:   DM   Pure hypercholesterolemia   OBSTRUCTIVE SLEEP APNEA   HYPERTENSION   Chronic diastolic heart failure   Diabetic foot ulcer   Acute on chronic renal failure   Normocytic anemia   Thrombocytopenia, unspecified   Leukocytosis, unspecified   Discharge Condition: Stable  Diet recommendation: Carbohydrate modified diet  Filed Weights   06/16/13 1800 06/16/13 2131  Weight: 118.389 kg (261 lb) 123.4 kg (272 lb 0.8 oz)    History of present illness:  [name] is a 67 y.o. female with Multiple Medical problems with recent Left Great toe Amputation who presents to the ED with complaints of worsening swelling and redness and pain of her left foot over the past 4 days. She also reports having fevers and chills today. She was seen by Orthopedics Dr. Lajoyce Corners 5 days ago and has been under surveillance for a blood blister on the plantar surface of her left foot. In the ED an X-ray was performed which revealed diffuse osteomyelitis and she was placed on IV Vancomycin and Zosyn and Dr. Lajoyce Corners was consulted.   Hospital Course:   1. Acute osteomyelitis with diabetic ulcer: Patient does have diabetic neuropathy, Charcot's arthropathy. She was presented to hospital with diabetic foot ulcer, started initially on IV vancomycin and Zosyn. Seen by Dr. Lajoyce Corners of the orthopedic service and by visual inspection some bony involvement was seen. Patient was taken to the OR, she and her done left BKA on 11/30. Afterwards PT evaluated the patient recommended SNF patient for discharge  today. Patient's leg ulcer grew MSSA, patient still had leukocytosis on the day of discharge so doxycycline for 7 more days as prescribed.  2. Acute on chronic renal failure: Patient has CKD stage III was baseline of creatinine of 1.5 secondary to HTN and diabetes type 2. Patient did have progressive worsening of creatinine since admission, she was off of her diuretics, creatinine today is 3.1. Patient will be restarted on her diuretics, special attention should be paid to creatinine as outpatient and the urine output.  3. Diabetes mellitus type 2, uncontrolled: With hemoglobin A1c of 10.2 which could correlate with mean plasma glucose of 246. Patient is on 80 units of Lantus each bedtime and 42 every morning. While patient in the hospital she did have episode of hypoglycemia with blood sugar went down to 52. Likely secondary to poor oral intake and acute renal failure which increase the efficacy of the Lantus insulin. On discharge her Lantus insulin decreased to 40 units every morning and 60 units every afternoon. This is might need further adjustment as outpatient.  4. Chronic diastolic CHF: Patient taking torsemide and carvedilol as outpatient. Torsemide was held for some time and restarted on discharge.  5. Leukocytosis secondary to the left foot osteomyelitis/diabetic foot ulcer. This is improving.  6. Hypertension: Patient is on carvedilol and torsemide continued.  7. Lethargy: Patient was sleepy post surgery but easy to arouse. Patient was taken Ativan twice a day, thought to be secondary to the Ativan effect. On discharge Ativan discontinued. This is improved.  Procedures:  None  Consultations:  None  Discharge Exam: Filed Vitals:   06/20/13 0828  BP: 169/65  Pulse: 66  Temp:   Resp:    General: Alert and awake, oriented x3, not in any acute distress. HEENT: anicteric sclera, pupils reactive to light and accommodation, EOMI CVS: S1-S2 clear, no murmur rubs or gallops Chest:  clear to auscultation bilaterally, no wheezing, rales or rhonchi Abdomen: soft nontender, nondistended, normal bowel sounds, no organomegaly Extremities: no cyanosis, clubbing or edema noted bilaterally Neuro: Cranial nerves II-XII intact, no focal neurological deficits  Discharge Instructions      Discharge Orders   Future Appointments Provider Department Dept Phone   08/09/2013 11:00 AM Barbaraann Share, MD [facility] [phone]   Future Orders Complete By Expires   Diet Carb Modified  As directed    Increase activity slowly  As directed        Medication List    STOP taking these medications       LORazepam 1 MG tablet  Commonly known as:  ATIVAN      TAKE these medications       allopurinol 300 MG tablet  Commonly known as:  ZYLOPRIM  Take 300 mg by mouth daily.     APIDRA 100 UNIT/ML injection  Generic drug:  insulin glulisine  Inject 5-20 Units into the skin 3 (three) times daily before meals. Sliding scale     BAYER ASPIRIN 325 MG tablet  Generic drug:  aspirin  Take 325 mg by mouth daily.     carvedilol 3.125 MG tablet  Commonly known as:  COREG  Take 3.125 mg by mouth 2 (two) times daily with a meal.     cyanocobalamin 1000 MCG/ML injection  Commonly known as:  (VITAMIN B-12)  Inject 1,000 mcg into the muscle every 30 (thirty) days.     doxycycline 100 MG capsule  Commonly known as:  VIBRAMYCIN  Take 1 capsule (100 mg total) by mouth 2 (two) times daily.     insulin glargine 100 UNIT/ML injection  Commonly known as:  LANTUS  Inject 40 units in AM and 60 units in PM     potassium chloride SA 20 MEQ tablet  Commonly known as:  K-DUR,KLOR-CON  Take 20 mEq by mouth daily.     sertraline 100 MG tablet  Commonly known as:  ZOLOFT  Take 50 mg by mouth daily.     simvastatin 40 MG tablet  Commonly known as:  ZOCOR  Take 1 tablet (40 mg total) by mouth at bedtime.     torsemide 20 MG tablet  Commonly known as:  DEMADEX  Take 3 tablets  (60 mg total) by mouth daily.     Vitamin D (Ergocalciferol) 50000 UNITS Caps capsule  Commonly known as:  DRISDOL  Take 50,000 Units by mouth 2 (two) times a week. Take on Mon and Thurs.       Allergies  Allergen Reactions  . Codeine Other (See Comments)    Causes confusion  . Meperidine Hcl Other (See Comments)    Increase in heart rate (demerol)  . Metformin Nausea And Vomiting   Follow-up Information   Follow up with DUDA,[name], MD In 2 weeks.   Specialty:  Orthopedic Surgery   Contact information:   [address] [place] [address] [place] [address] [phone]       Follow up with [facility], MD In 1 week.   Specialty:  Family Medicine   Contact information:   Dr. Sharman Crate Hamrick [address]  [place] [address] [phone]        The results of significant diagnostics from this hospitalization (including imaging, microbiology, ancillary and laboratory) are listed below for reference.    Significant Diagnostic Studies: Mr Foot Left Wo Contrast  06/17/2013   CLINICAL DATA:  Osteomyelitis.  Diabetes.  Fever.  Foot pain.  EXAM: MRI OF THE LEFT FOREFOOT WITHOUT CONTRAST  TECHNIQUE: Multiplanar, multisequence MR imaging was performed. No intravenous contrast was administered.  COMPARISON:  06/16/2013  FINDINGS: Severe motion artifact reduces exam sensitivity and specificity.  The middle and lateral cuneiforms are partially collapsed/ resorbed. There is proximal migration and dorsal subluxation of the metatarsal bases at the Lisfranc joint.  Prior fracture in deformity of the distal 2nd metatarsal. Abnormal low T1 signal in the base and shaft of the proximal phalanx 3rd toe.  Extensive subcutaneous and plantar muscular edema. There are erosions along the metatarsal bases.  There is a plantar skin defect below the dislocated articulation between the cuboid and 4th and 5th metatarsals with surrounding edema shown on image 14 of series 3, favoring ulceration.   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  On coronal images there is evidence of extensor digitorum longus tenosynovitis.  IMPRESSION: 1. Extensive Charcot arthropathy in the midfoot, especially along the Lisfranc joint, which is dislocated with partial resort shin/collapse of the middle and lateral cuneiforms and with erosions along the Lisfranc joint. 2. Possible osteomyelitis of the proximal phalanx of the 3rd toe. There is significantly reduced T1 signal in the proximal phalanx although elevated T2 signal is faint. 3. 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 4. Extensive subcutaneous and muscular edema compatible with cellulitis and myositis. Ulceration is suspected below the cuboid. 5. Prominent extensor digitorum longus tenosynovitis.   Electronically Signed   By: Herbie Baltimore M.D.   On: 06/17/2013 14:22   Dg Foot Complete Left  06/16/2013   CLINICAL DATA:  Infection.  History of left great toe amputation.  EXAM: LEFT FOOT - COMPLETE 3+ VIEW  COMPARISON:  Left great toe a radiographs 09/22/2012  FINDINGS: There has been interval amputation of the 1st ray. There is lucency/erosion in the medial cuneiform, consist with osteomyelitis. Since the radiographs of 09/22/2012, there has been loss of the normal tarsometatarsal joint spaces, with decreased mineralization hand indistinctness of the cortical margins of the tarsals and the proximal metatarsals. Subluxation of the tarsometatarsal joints cannot be excluded.  There is extensive periosteal reaction/new bone formation about the distal 2nd metatarsal, that is new from prior radiographs. The 2nd metatarsophalangeal joint space is widened and likely subluxed. There is also periosteal reaction about the distal 3rd metatarsal. There is extensive soft tissue swelling about the left foot. There are severe peripheral vascular  atherosclerotic calcifications.  IMPRESSION: Findings very worrisome for diffuse osteomyelitis of the left foot, most prominent at the level of the tarsometatarsal joints. Given the disorganization tarsometatarsal joints, an associated Charcot's joint is not excluded. Osteomyelitis of at least the 2nd and 3rd metatarsals is also suspected. Probable diffuse cellulitis of the left foot given the extensive soft tissue swelling.   Electronically Signed   By: Britta Mccreedy M.D.   On: 06/16/2013 18:59    Microbiology: Recent Results (from the past 240 hour(s))  SURGICAL PCR SCREEN     Status: Abnormal   Collection Time    06/16/13  9:04 PM  Result Value Range Status   MRSA, PCR NEGATIVE  NEGATIVE Final   Staphylococcus aureus POSITIVE (*) NEGATIVE Final   Comment:            The Xpert SA Assay (FDA     approved for NASAL specimens     in patients over 33 years of age),     is one component of     a comprehensive surveillance     program.  Test performance has     been validated by [employer] for patients greater     than or equal to 22 year old.     It is not intended     to diagnose infection nor to     guide or monitor treatment.     RESULT CALLED TO, READ BACK BY AND VERIFIED WITH:     Wess Botts RN [phone] [id number] GREEN R  WOUND CULTURE     Status: None   Collection Time    06/17/13 10:24 AM      Result Value Range Status   Specimen Description WOUND LEFT FOOT   Final   Special Requests Immunocompromised   Final   Gram Stain     Final   Value: ABUNDANT WBC PRESENT,BOTH PMN AND MONONUCLEAR     RARE SQUAMOUS EPITHELIAL CELLS PRESENT     ABUNDANT GRAM POSITIVE COCCI IN PAIRS     Performed at [facility]   Culture     Final   Value: ABUNDANT STAPHYLOCOCCUS AUREUS     Note: RIFAMPIN AND GENTAMICIN SHOULD NOT BE USED AS SINGLE DRUGS FOR TREATMENT OF STAPH INFECTIONS. This organism is presumed to be Clindamycin resistant based on detection of inducible Clindamycin  resistance.     Performed at [facility]   Report Status 06/20/2013 FINAL   Final   Organism ID, Bacteria STAPHYLOCOCCUS AUREUS   Final     Labs: Basic Metabolic Panel:  Recent Labs Lab 06/16/13 1753 06/17/13 0530 06/18/13 0652 06/18/13 0826 06/19/13 0725 06/20/13 0505  NA 137 139 136  --  136 137  K 3.9 4.2 4.2  --  4.6 4.3  CL 100 101 99  --  100 101  CO2 24 25 23   --  21 20  GLUCOSE 130* 127* 174* 166* 78 51*  BUN 54* 57* 65*  --  73* 80*  CREATININE 1.50* 1.73* 2.43*  --  3.08* 3.12*  CALCIUM 9.5 8.9 8.5  --  8.4 8.5   Liver Function Tests:  Recent Labs Lab 06/16/13 1753  AST 20  ALT 17  ALKPHOS 71  BILITOT 0.6  PROT 6.9  ALBUMIN 3.4*   No results found for this basename: LIPASE, AMYLASE,  in the last 168 hours No results found for this basename: AMMONIA,  in the last 168 hours CBC:  Recent Labs Lab 06/16/13 1753 06/17/13 0530 06/18/13 0652 06/20/13 0505  WBC 18.1* 21.0* 11.6* 11.2*  NEUTROABS 15.4*  --   --   --   HGB 10.4* 9.8* 8.9* 8.5*  HCT 31.3* 30.1* 27.1* 26.7*  MCV 86.7 88.5 87.7 87.8  PLT 124* 127* 104* 121*   Cardiac Enzymes: No results found for this basename: CKTOTAL, CKMB, CKMBINDEX, TROPONINI,  in the last 168 hours BNP: BNP (last 3 results) No results found for this basename: PROBNP,  in the last 8760 hours CBG:  Recent Labs Lab 06/20/13 0512 06/20/13 0545 06/20/13 0754 06/20/13 0833 06/20/13 0907  GLUCAP 52* 81  55* 60* 75       Signed:  Meagan Ancona A  [facility] 06/20/2013, 11:31 AM

## 2013-06-20 NOTE — Progress Notes (Signed)
Hypoglycemic Event  CBG: 52  Treatment: D50 IV 25 mL  Symptoms: Sweaty, lethargic  Follow-up CBG: Time:0545 CBG Result:81  Possible Reasons for Event: Medication regimen: Lantus 80 units at HS  Comments/MD notified: K. Schorr provider on call text paged to notify.    [name] [facility]  Remember to initiate Hypoglycemia Order Set & complete

## 2013-06-20 NOTE — Progress Notes (Signed)
Hypoglycemic Event  CBG: 55  Treatment: 15 GM carbohydrate snack  Symptoms: None  Follow-up CBG: Time:08:30 CBG Result:75  Possible Reasons for Event: Inadequate meal intake  Comments/MD notified:Dr. Arthor Captain is aware, Lantus not given.    Kenna Kirn L  Remember to initiate Hypoglycemia Order Set & complete

## 2013-06-20 NOTE — Progress Notes (Signed)
Patient ID: [name], female   DOB: 06/20/1946, 67 y.o.   MRN: [id number] Postoperative day 2 left transtibial amputation. Patient is stable is ready for discharge to skilled nursing. I will followup in the office 2 weeks after discharge.

## 2013-06-20 NOTE — Progress Notes (Signed)
Nsg Discharge Note  Admit Date:  06/16/2013 Discharge date: 06/20/2013   [facility] to be D/C'd Nursing Home per MD order.  AVS completed.  Copy for chart, and copy for patient signed, and dated. Patient/caregiver able to verbalize understanding.  Discharge Medication:   Medication List    STOP taking these medications       LORazepam 1 MG tablet  Commonly known as:  ATIVAN      TAKE these medications       allopurinol 300 MG tablet  Commonly known as:  ZYLOPRIM  Take 300 mg by mouth daily.     APIDRA 100 UNIT/ML injection  Generic drug:  insulin glulisine  Inject 5-20 Units into the skin 3 (three) times daily before meals. Sliding scale     BAYER ASPIRIN 325 MG tablet  Generic drug:  aspirin  Take 325 mg by mouth daily.     carvedilol 3.125 MG tablet  Commonly known as:  COREG  Take 3.125 mg by mouth 2 (two) times daily with a meal.     cyanocobalamin 1000 MCG/ML injection  Commonly known as:  (VITAMIN B-12)  Inject 1,000 mcg into the muscle every 30 (thirty) days.     doxycycline 100 MG capsule  Commonly known as:  VIBRAMYCIN  Take 1 capsule (100 mg total) by mouth 2 (two) times daily.     insulin glargine 100 UNIT/ML injection  Commonly known as:  LANTUS  Inject 40 units in AM and 60 units in PM     potassium chloride SA 20 MEQ tablet  Commonly known as:  K-DUR,KLOR-CON  Take 20 mEq by mouth daily.     sertraline 100 MG tablet  Commonly known as:  ZOLOFT  Take 50 mg by mouth daily.     simvastatin 40 MG tablet  Commonly known as:  ZOCOR  Take 1 tablet (40 mg total) by mouth at bedtime.     torsemide 20 MG tablet  Commonly known as:  DEMADEX  Take 3 tablets (60 mg total) by mouth daily.     Vitamin D (Ergocalciferol) 50000 UNITS Caps capsule  Commonly known as:  DRISDOL  Take 50,000 Units by mouth 2 (two) times a week. Take on Mon and Thurs.        Discharge Assessment: Filed Vitals:   06/20/13 1415  BP: 180/71  Pulse: 71  Temp: 97.3  F (36.3 C)  Resp:    Skin clean, dry and intact without evidence of skin break down, no evidence of skin tears noted. IV catheter discontinued intact. Site without signs and symptoms of complications - no redness or edema noted at insertion site, patient denies c/o pain - only slight tenderness at site.  Dressing with slight pressure applied.  D/c Instructions-Education: Discharge instructions given to patient/family with verbalized understanding. D/c education completed with patient/family including follow up instructions, medication list, d/c activities limitations if indicated, with other d/c instructions as indicated by MD - patient able to verbalize understanding, all questions fully answered. Patient instructed to return to ED, call 911, or call MD for any changes in condition.  Patient escorted via EMS.  Kern Reap, RN 06/20/2013 2:38 PM

## 2013-06-20 NOTE — Care Management Note (Signed)
    Page 1 of 1   06/20/2013     10:54:14 AM   CARE MANAGEMENT NOTE 06/20/2013  Patient:  [name], [name]   Account Number:  [id number]  Date Initiated:  06/17/2013  Documentation initiated by:  [facility]  Subjective/Objective Assessment:   osteomyelitis of foot     Action/Plan:   waiting PT/OT recommendation  rec snf   Anticipated DC Date:  06/20/2013   Anticipated DC Plan:  SKILLED NURSING FACILITY  In-house referral  Clinical Social Worker      DC Planning Services  CM consult      Choice offered to / List presented to:             Status of service:  Completed, signed off Medicare Important Message given?   (If response is "NO", the following Medicare IM given date fields will be blank) Date Medicare IM given:   Date Additional Medicare IM given:    Discharge Disposition:  SKILLED NURSING FACILITY  Per UR Regulation:  Reviewed for med. necessity/level of care/duration of stay  If discussed at Long Length of Stay Meetings, dates discussed:    Comments:  06/20/13 10:52 Letha Cape RN, BSN [phone] patient lives with spouse, per physical therapy recs snf, patient for dc today to [facility] [place].  CSW following.

## 2013-06-22 NOTE — Clinical Social Work Placement (Signed)
Clinical Social Work Department CLINICAL SOCIAL WORK PLACEMENT NOTE 06/22/2013  Patient:  [name], [name]  Account Number:  [id number] Admit date:  06/16/2013  Clinical Social Worker:  Cherre Blanc, [place]  Date/time:  06/19/2013 11:30 AM  Clinical Social Work is seeking post-discharge placement for this patient at the following level of care:   SKILLED NURSING   (*CSW will update this form in Epic as items are completed)   06/19/2013  Patient/family provided with [facility] Department of Clinical Social Work's list of facilities offering this level of care within the geographic area requested by the patient (or if unable, by the patient's family).  06/19/2013  Patient/family informed of their freedom to choose among providers that offer the needed level of care, that participate in Medicare, Medicaid or managed care program needed by the patient, have an available bed and are willing to accept the patient.  06/19/2013  Patient/family informed of [facility]' ownership interest in [facility], as well as of the fact that they are under no obligation to receive care at this facility.  PASARR submitted to EDS on 06/19/2013 PASARR number received from EDS on 06/19/2013  FL2 transmitted to all facilities in geographic area requested by pt/family on  06/19/2013 FL2 transmitted to all facilities within larger geographic area on   Patient informed that his/her managed care company has contracts with or will negotiate with  certain facilities, including the following:     Patient/family informed of bed offers received:  06/19/2013 Patient chooses bed at OTHER Physician recommends and patient chooses bed at    Patient to be transferred to OTHER on  06/20/2013 Patient to be transferred to facility by   The following physician request were entered in Epic:   Additional Comments: Per MD patient ready for DC to [facility] [place]. Patient, daughter, RN, and  facility notified of DC. RN given number for report. Ambulance transport requested for patient. DC packet left with patient's chart. CSW signing off.   Roddie Mc, [place], [place], [phone]

## 2013-06-30 DIAGNOSIS — D72829 Elevated white blood cell count, unspecified: Secondary | ICD-10-CM | POA: Diagnosis not present

## 2013-07-04 DIAGNOSIS — D649 Anemia, unspecified: Secondary | ICD-10-CM | POA: Diagnosis not present

## 2013-08-07 DIAGNOSIS — H43399 Other vitreous opacities, unspecified eye: Secondary | ICD-10-CM | POA: Diagnosis not present

## 2013-08-09 ENCOUNTER — Ambulatory Visit: Payer: Medicare Other | Admitting: Pulmonary Disease

## 2013-08-15 DIAGNOSIS — N181 Chronic kidney disease, stage 1: Secondary | ICD-10-CM | POA: Diagnosis not present

## 2013-08-15 DIAGNOSIS — E1129 Type 2 diabetes mellitus with other diabetic kidney complication: Secondary | ICD-10-CM | POA: Diagnosis not present

## 2013-08-15 DIAGNOSIS — N390 Urinary tract infection, site not specified: Secondary | ICD-10-CM | POA: Diagnosis not present

## 2013-09-21 DIAGNOSIS — I83219 Varicose veins of right lower extremity with both ulcer of unspecified site and inflammation: Secondary | ICD-10-CM | POA: Diagnosis not present

## 2013-09-21 DIAGNOSIS — S88119A Complete traumatic amputation at level between knee and ankle, unspecified lower leg, initial encounter: Secondary | ICD-10-CM | POA: Diagnosis not present

## 2013-09-21 DIAGNOSIS — E1149 Type 2 diabetes mellitus with other diabetic neurological complication: Secondary | ICD-10-CM | POA: Diagnosis not present

## 2013-09-21 DIAGNOSIS — L97209 Non-pressure chronic ulcer of unspecified calf with unspecified severity: Secondary | ICD-10-CM | POA: Diagnosis not present

## 2013-09-28 DIAGNOSIS — L97209 Non-pressure chronic ulcer of unspecified calf with unspecified severity: Secondary | ICD-10-CM | POA: Diagnosis not present

## 2013-09-28 DIAGNOSIS — I5030 Unspecified diastolic (congestive) heart failure: Secondary | ICD-10-CM | POA: Diagnosis not present

## 2013-09-28 DIAGNOSIS — L97929 Non-pressure chronic ulcer of unspecified part of left lower leg with unspecified severity: Secondary | ICD-10-CM | POA: Diagnosis not present

## 2013-09-28 DIAGNOSIS — M25569 Pain in unspecified knee: Secondary | ICD-10-CM | POA: Diagnosis not present

## 2013-09-28 DIAGNOSIS — M86679 Other chronic osteomyelitis, unspecified ankle and foot: Secondary | ICD-10-CM | POA: Diagnosis not present

## 2013-09-28 DIAGNOSIS — R489 Unspecified symbolic dysfunctions: Secondary | ICD-10-CM | POA: Diagnosis not present

## 2013-09-28 DIAGNOSIS — M6281 Muscle weakness (generalized): Secondary | ICD-10-CM | POA: Diagnosis not present

## 2013-09-28 DIAGNOSIS — R262 Difficulty in walking, not elsewhere classified: Secondary | ICD-10-CM | POA: Diagnosis not present

## 2013-09-28 DIAGNOSIS — E1149 Type 2 diabetes mellitus with other diabetic neurological complication: Secondary | ICD-10-CM | POA: Diagnosis not present

## 2013-09-28 DIAGNOSIS — M6250 Muscle wasting and atrophy, not elsewhere classified, unspecified site: Secondary | ICD-10-CM | POA: Diagnosis not present

## 2013-09-28 DIAGNOSIS — E119 Type 2 diabetes mellitus without complications: Secondary | ICD-10-CM | POA: Diagnosis not present

## 2013-09-28 DIAGNOSIS — S88119A Complete traumatic amputation at level between knee and ankle, unspecified lower leg, initial encounter: Secondary | ICD-10-CM | POA: Diagnosis not present

## 2013-09-28 DIAGNOSIS — I10 Essential (primary) hypertension: Secondary | ICD-10-CM | POA: Diagnosis not present

## 2013-09-28 DIAGNOSIS — I83219 Varicose veins of right lower extremity with both ulcer of unspecified site and inflammation: Secondary | ICD-10-CM | POA: Diagnosis not present

## 2013-09-29 DIAGNOSIS — M86679 Other chronic osteomyelitis, unspecified ankle and foot: Secondary | ICD-10-CM | POA: Diagnosis not present

## 2013-09-29 DIAGNOSIS — E119 Type 2 diabetes mellitus without complications: Secondary | ICD-10-CM | POA: Diagnosis not present

## 2013-09-29 DIAGNOSIS — I10 Essential (primary) hypertension: Secondary | ICD-10-CM | POA: Diagnosis not present

## 2013-09-29 DIAGNOSIS — I5030 Unspecified diastolic (congestive) heart failure: Secondary | ICD-10-CM | POA: Diagnosis not present

## 2013-09-29 DIAGNOSIS — S88119A Complete traumatic amputation at level between knee and ankle, unspecified lower leg, initial encounter: Secondary | ICD-10-CM | POA: Diagnosis not present

## 2013-10-02 DIAGNOSIS — I5030 Unspecified diastolic (congestive) heart failure: Secondary | ICD-10-CM | POA: Diagnosis not present

## 2013-10-02 DIAGNOSIS — I10 Essential (primary) hypertension: Secondary | ICD-10-CM | POA: Diagnosis not present

## 2013-10-02 DIAGNOSIS — E119 Type 2 diabetes mellitus without complications: Secondary | ICD-10-CM | POA: Diagnosis not present

## 2013-10-02 DIAGNOSIS — M86679 Other chronic osteomyelitis, unspecified ankle and foot: Secondary | ICD-10-CM | POA: Diagnosis not present

## 2013-10-02 DIAGNOSIS — S88119A Complete traumatic amputation at level between knee and ankle, unspecified lower leg, initial encounter: Secondary | ICD-10-CM | POA: Diagnosis not present

## 2013-10-03 DIAGNOSIS — I10 Essential (primary) hypertension: Secondary | ICD-10-CM | POA: Diagnosis not present

## 2013-10-03 DIAGNOSIS — S88119A Complete traumatic amputation at level between knee and ankle, unspecified lower leg, initial encounter: Secondary | ICD-10-CM | POA: Diagnosis not present

## 2013-10-03 DIAGNOSIS — I5030 Unspecified diastolic (congestive) heart failure: Secondary | ICD-10-CM | POA: Diagnosis not present

## 2013-10-03 DIAGNOSIS — M86679 Other chronic osteomyelitis, unspecified ankle and foot: Secondary | ICD-10-CM | POA: Diagnosis not present

## 2013-10-03 DIAGNOSIS — E119 Type 2 diabetes mellitus without complications: Secondary | ICD-10-CM | POA: Diagnosis not present

## 2013-10-04 DIAGNOSIS — I5030 Unspecified diastolic (congestive) heart failure: Secondary | ICD-10-CM | POA: Diagnosis not present

## 2013-10-04 DIAGNOSIS — L97209 Non-pressure chronic ulcer of unspecified calf with unspecified severity: Secondary | ICD-10-CM | POA: Diagnosis not present

## 2013-10-04 DIAGNOSIS — M86679 Other chronic osteomyelitis, unspecified ankle and foot: Secondary | ICD-10-CM | POA: Diagnosis not present

## 2013-10-04 DIAGNOSIS — E119 Type 2 diabetes mellitus without complications: Secondary | ICD-10-CM | POA: Diagnosis not present

## 2013-10-04 DIAGNOSIS — S88119A Complete traumatic amputation at level between knee and ankle, unspecified lower leg, initial encounter: Secondary | ICD-10-CM | POA: Diagnosis not present

## 2013-10-04 DIAGNOSIS — I10 Essential (primary) hypertension: Secondary | ICD-10-CM | POA: Diagnosis not present

## 2013-10-05 DIAGNOSIS — I5030 Unspecified diastolic (congestive) heart failure: Secondary | ICD-10-CM | POA: Diagnosis not present

## 2013-10-05 DIAGNOSIS — M86679 Other chronic osteomyelitis, unspecified ankle and foot: Secondary | ICD-10-CM | POA: Diagnosis not present

## 2013-10-05 DIAGNOSIS — I10 Essential (primary) hypertension: Secondary | ICD-10-CM | POA: Diagnosis not present

## 2013-10-05 DIAGNOSIS — S88119A Complete traumatic amputation at level between knee and ankle, unspecified lower leg, initial encounter: Secondary | ICD-10-CM | POA: Diagnosis not present

## 2013-10-05 DIAGNOSIS — E119 Type 2 diabetes mellitus without complications: Secondary | ICD-10-CM | POA: Diagnosis not present

## 2013-10-06 DIAGNOSIS — I5030 Unspecified diastolic (congestive) heart failure: Secondary | ICD-10-CM | POA: Diagnosis not present

## 2013-10-06 DIAGNOSIS — M86679 Other chronic osteomyelitis, unspecified ankle and foot: Secondary | ICD-10-CM | POA: Diagnosis not present

## 2013-10-06 DIAGNOSIS — S88119A Complete traumatic amputation at level between knee and ankle, unspecified lower leg, initial encounter: Secondary | ICD-10-CM | POA: Diagnosis not present

## 2013-10-06 DIAGNOSIS — E119 Type 2 diabetes mellitus without complications: Secondary | ICD-10-CM | POA: Diagnosis not present

## 2013-10-06 DIAGNOSIS — I10 Essential (primary) hypertension: Secondary | ICD-10-CM | POA: Diagnosis not present

## 2013-10-09 DIAGNOSIS — E119 Type 2 diabetes mellitus without complications: Secondary | ICD-10-CM | POA: Diagnosis not present

## 2013-10-09 DIAGNOSIS — S88119A Complete traumatic amputation at level between knee and ankle, unspecified lower leg, initial encounter: Secondary | ICD-10-CM | POA: Diagnosis not present

## 2013-10-09 DIAGNOSIS — I10 Essential (primary) hypertension: Secondary | ICD-10-CM | POA: Diagnosis not present

## 2013-10-09 DIAGNOSIS — I5030 Unspecified diastolic (congestive) heart failure: Secondary | ICD-10-CM | POA: Diagnosis not present

## 2013-10-09 DIAGNOSIS — M86679 Other chronic osteomyelitis, unspecified ankle and foot: Secondary | ICD-10-CM | POA: Diagnosis not present

## 2013-10-10 DIAGNOSIS — S88119A Complete traumatic amputation at level between knee and ankle, unspecified lower leg, initial encounter: Secondary | ICD-10-CM | POA: Diagnosis not present

## 2013-10-10 DIAGNOSIS — M86679 Other chronic osteomyelitis, unspecified ankle and foot: Secondary | ICD-10-CM | POA: Diagnosis not present

## 2013-10-10 DIAGNOSIS — E119 Type 2 diabetes mellitus without complications: Secondary | ICD-10-CM | POA: Diagnosis not present

## 2013-10-10 DIAGNOSIS — I10 Essential (primary) hypertension: Secondary | ICD-10-CM | POA: Diagnosis not present

## 2013-10-10 DIAGNOSIS — I5030 Unspecified diastolic (congestive) heart failure: Secondary | ICD-10-CM | POA: Diagnosis not present

## 2013-10-11 DIAGNOSIS — I83219 Varicose veins of right lower extremity with both ulcer of unspecified site and inflammation: Secondary | ICD-10-CM | POA: Diagnosis not present

## 2013-10-11 DIAGNOSIS — E119 Type 2 diabetes mellitus without complications: Secondary | ICD-10-CM | POA: Diagnosis not present

## 2013-10-11 DIAGNOSIS — I5030 Unspecified diastolic (congestive) heart failure: Secondary | ICD-10-CM | POA: Diagnosis not present

## 2013-10-11 DIAGNOSIS — S88119A Complete traumatic amputation at level between knee and ankle, unspecified lower leg, initial encounter: Secondary | ICD-10-CM | POA: Diagnosis not present

## 2013-10-11 DIAGNOSIS — I83229 Varicose veins of left lower extremity with both ulcer of unspecified site and inflammation: Secondary | ICD-10-CM | POA: Diagnosis not present

## 2013-10-11 DIAGNOSIS — L97209 Non-pressure chronic ulcer of unspecified calf with unspecified severity: Secondary | ICD-10-CM | POA: Diagnosis not present

## 2013-10-11 DIAGNOSIS — M86679 Other chronic osteomyelitis, unspecified ankle and foot: Secondary | ICD-10-CM | POA: Diagnosis not present

## 2013-10-11 DIAGNOSIS — I10 Essential (primary) hypertension: Secondary | ICD-10-CM | POA: Diagnosis not present

## 2013-10-11 DIAGNOSIS — E1149 Type 2 diabetes mellitus with other diabetic neurological complication: Secondary | ICD-10-CM | POA: Diagnosis not present

## 2013-10-12 DIAGNOSIS — I5030 Unspecified diastolic (congestive) heart failure: Secondary | ICD-10-CM | POA: Diagnosis not present

## 2013-10-12 DIAGNOSIS — S88119A Complete traumatic amputation at level between knee and ankle, unspecified lower leg, initial encounter: Secondary | ICD-10-CM | POA: Diagnosis not present

## 2013-10-12 DIAGNOSIS — M86679 Other chronic osteomyelitis, unspecified ankle and foot: Secondary | ICD-10-CM | POA: Diagnosis not present

## 2013-10-12 DIAGNOSIS — I10 Essential (primary) hypertension: Secondary | ICD-10-CM | POA: Diagnosis not present

## 2013-10-12 DIAGNOSIS — E119 Type 2 diabetes mellitus without complications: Secondary | ICD-10-CM | POA: Diagnosis not present

## 2013-10-13 DIAGNOSIS — I10 Essential (primary) hypertension: Secondary | ICD-10-CM | POA: Diagnosis not present

## 2013-10-13 DIAGNOSIS — S88119A Complete traumatic amputation at level between knee and ankle, unspecified lower leg, initial encounter: Secondary | ICD-10-CM | POA: Diagnosis not present

## 2013-10-13 DIAGNOSIS — M86679 Other chronic osteomyelitis, unspecified ankle and foot: Secondary | ICD-10-CM | POA: Diagnosis not present

## 2013-10-13 DIAGNOSIS — I5030 Unspecified diastolic (congestive) heart failure: Secondary | ICD-10-CM | POA: Diagnosis not present

## 2013-10-13 DIAGNOSIS — E119 Type 2 diabetes mellitus without complications: Secondary | ICD-10-CM | POA: Diagnosis not present

## 2013-10-16 DIAGNOSIS — M86679 Other chronic osteomyelitis, unspecified ankle and foot: Secondary | ICD-10-CM | POA: Diagnosis not present

## 2013-10-16 DIAGNOSIS — I5030 Unspecified diastolic (congestive) heart failure: Secondary | ICD-10-CM | POA: Diagnosis not present

## 2013-10-16 DIAGNOSIS — I10 Essential (primary) hypertension: Secondary | ICD-10-CM | POA: Diagnosis not present

## 2013-10-16 DIAGNOSIS — S88119A Complete traumatic amputation at level between knee and ankle, unspecified lower leg, initial encounter: Secondary | ICD-10-CM | POA: Diagnosis not present

## 2013-10-16 DIAGNOSIS — E119 Type 2 diabetes mellitus without complications: Secondary | ICD-10-CM | POA: Diagnosis not present

## 2013-10-17 DIAGNOSIS — I10 Essential (primary) hypertension: Secondary | ICD-10-CM | POA: Diagnosis not present

## 2013-10-17 DIAGNOSIS — S88119A Complete traumatic amputation at level between knee and ankle, unspecified lower leg, initial encounter: Secondary | ICD-10-CM | POA: Diagnosis not present

## 2013-10-17 DIAGNOSIS — I5030 Unspecified diastolic (congestive) heart failure: Secondary | ICD-10-CM | POA: Diagnosis not present

## 2013-10-17 DIAGNOSIS — M86679 Other chronic osteomyelitis, unspecified ankle and foot: Secondary | ICD-10-CM | POA: Diagnosis not present

## 2013-10-17 DIAGNOSIS — E119 Type 2 diabetes mellitus without complications: Secondary | ICD-10-CM | POA: Diagnosis not present

## 2013-10-18 DIAGNOSIS — R489 Unspecified symbolic dysfunctions: Secondary | ICD-10-CM | POA: Diagnosis not present

## 2013-10-18 DIAGNOSIS — M86679 Other chronic osteomyelitis, unspecified ankle and foot: Secondary | ICD-10-CM | POA: Diagnosis not present

## 2013-10-18 DIAGNOSIS — M6281 Muscle weakness (generalized): Secondary | ICD-10-CM | POA: Diagnosis not present

## 2013-10-18 DIAGNOSIS — I5030 Unspecified diastolic (congestive) heart failure: Secondary | ICD-10-CM | POA: Diagnosis not present

## 2013-10-18 DIAGNOSIS — S88119A Complete traumatic amputation at level between knee and ankle, unspecified lower leg, initial encounter: Secondary | ICD-10-CM | POA: Diagnosis not present

## 2013-10-18 DIAGNOSIS — E1149 Type 2 diabetes mellitus with other diabetic neurological complication: Secondary | ICD-10-CM | POA: Diagnosis not present

## 2013-10-18 DIAGNOSIS — E119 Type 2 diabetes mellitus without complications: Secondary | ICD-10-CM | POA: Diagnosis not present

## 2013-10-18 DIAGNOSIS — I10 Essential (primary) hypertension: Secondary | ICD-10-CM | POA: Diagnosis not present

## 2013-10-18 DIAGNOSIS — M25569 Pain in unspecified knee: Secondary | ICD-10-CM | POA: Diagnosis not present

## 2013-10-18 DIAGNOSIS — R262 Difficulty in walking, not elsewhere classified: Secondary | ICD-10-CM | POA: Diagnosis not present

## 2013-10-18 DIAGNOSIS — M6250 Muscle wasting and atrophy, not elsewhere classified, unspecified site: Secondary | ICD-10-CM | POA: Diagnosis not present

## 2013-10-19 DIAGNOSIS — L97209 Non-pressure chronic ulcer of unspecified calf with unspecified severity: Secondary | ICD-10-CM | POA: Diagnosis not present

## 2013-10-19 DIAGNOSIS — I83219 Varicose veins of right lower extremity with both ulcer of unspecified site and inflammation: Secondary | ICD-10-CM | POA: Diagnosis not present

## 2013-10-19 DIAGNOSIS — L97409 Non-pressure chronic ulcer of unspecified heel and midfoot with unspecified severity: Secondary | ICD-10-CM | POA: Diagnosis not present

## 2013-10-19 DIAGNOSIS — I5030 Unspecified diastolic (congestive) heart failure: Secondary | ICD-10-CM | POA: Diagnosis not present

## 2013-10-19 DIAGNOSIS — I83229 Varicose veins of left lower extremity with both ulcer of unspecified site and inflammation: Secondary | ICD-10-CM | POA: Diagnosis not present

## 2013-10-19 DIAGNOSIS — E119 Type 2 diabetes mellitus without complications: Secondary | ICD-10-CM | POA: Diagnosis not present

## 2013-10-19 DIAGNOSIS — I10 Essential (primary) hypertension: Secondary | ICD-10-CM | POA: Diagnosis not present

## 2013-10-19 DIAGNOSIS — E1149 Type 2 diabetes mellitus with other diabetic neurological complication: Secondary | ICD-10-CM | POA: Diagnosis not present

## 2013-10-19 DIAGNOSIS — S88119A Complete traumatic amputation at level between knee and ankle, unspecified lower leg, initial encounter: Secondary | ICD-10-CM | POA: Diagnosis not present

## 2013-10-19 DIAGNOSIS — M86679 Other chronic osteomyelitis, unspecified ankle and foot: Secondary | ICD-10-CM | POA: Diagnosis not present

## 2013-10-20 DIAGNOSIS — I5030 Unspecified diastolic (congestive) heart failure: Secondary | ICD-10-CM | POA: Diagnosis not present

## 2013-10-20 DIAGNOSIS — M86679 Other chronic osteomyelitis, unspecified ankle and foot: Secondary | ICD-10-CM | POA: Diagnosis not present

## 2013-10-20 DIAGNOSIS — I10 Essential (primary) hypertension: Secondary | ICD-10-CM | POA: Diagnosis not present

## 2013-10-20 DIAGNOSIS — E119 Type 2 diabetes mellitus without complications: Secondary | ICD-10-CM | POA: Diagnosis not present

## 2013-10-20 DIAGNOSIS — S88119A Complete traumatic amputation at level between knee and ankle, unspecified lower leg, initial encounter: Secondary | ICD-10-CM | POA: Diagnosis not present

## 2013-10-23 DIAGNOSIS — I5030 Unspecified diastolic (congestive) heart failure: Secondary | ICD-10-CM | POA: Diagnosis not present

## 2013-10-23 DIAGNOSIS — E119 Type 2 diabetes mellitus without complications: Secondary | ICD-10-CM | POA: Diagnosis not present

## 2013-10-23 DIAGNOSIS — M86679 Other chronic osteomyelitis, unspecified ankle and foot: Secondary | ICD-10-CM | POA: Diagnosis not present

## 2013-10-23 DIAGNOSIS — I10 Essential (primary) hypertension: Secondary | ICD-10-CM | POA: Diagnosis not present

## 2013-10-23 DIAGNOSIS — S88119A Complete traumatic amputation at level between knee and ankle, unspecified lower leg, initial encounter: Secondary | ICD-10-CM | POA: Diagnosis not present

## 2013-10-24 DIAGNOSIS — S88119A Complete traumatic amputation at level between knee and ankle, unspecified lower leg, initial encounter: Secondary | ICD-10-CM | POA: Diagnosis not present

## 2013-10-24 DIAGNOSIS — E119 Type 2 diabetes mellitus without complications: Secondary | ICD-10-CM | POA: Diagnosis not present

## 2013-10-24 DIAGNOSIS — I5030 Unspecified diastolic (congestive) heart failure: Secondary | ICD-10-CM | POA: Diagnosis not present

## 2013-10-24 DIAGNOSIS — I10 Essential (primary) hypertension: Secondary | ICD-10-CM | POA: Diagnosis not present

## 2013-10-24 DIAGNOSIS — M86679 Other chronic osteomyelitis, unspecified ankle and foot: Secondary | ICD-10-CM | POA: Diagnosis not present

## 2013-10-25 DIAGNOSIS — M86679 Other chronic osteomyelitis, unspecified ankle and foot: Secondary | ICD-10-CM | POA: Diagnosis not present

## 2013-10-25 DIAGNOSIS — E119 Type 2 diabetes mellitus without complications: Secondary | ICD-10-CM | POA: Diagnosis not present

## 2013-10-25 DIAGNOSIS — S88119A Complete traumatic amputation at level between knee and ankle, unspecified lower leg, initial encounter: Secondary | ICD-10-CM | POA: Diagnosis not present

## 2013-10-25 DIAGNOSIS — I10 Essential (primary) hypertension: Secondary | ICD-10-CM | POA: Diagnosis not present

## 2013-10-25 DIAGNOSIS — I5030 Unspecified diastolic (congestive) heart failure: Secondary | ICD-10-CM | POA: Diagnosis not present

## 2013-10-26 DIAGNOSIS — M86679 Other chronic osteomyelitis, unspecified ankle and foot: Secondary | ICD-10-CM | POA: Diagnosis not present

## 2013-10-26 DIAGNOSIS — I10 Essential (primary) hypertension: Secondary | ICD-10-CM | POA: Diagnosis not present

## 2013-10-26 DIAGNOSIS — S88119A Complete traumatic amputation at level between knee and ankle, unspecified lower leg, initial encounter: Secondary | ICD-10-CM | POA: Diagnosis not present

## 2013-10-26 DIAGNOSIS — I5030 Unspecified diastolic (congestive) heart failure: Secondary | ICD-10-CM | POA: Diagnosis not present

## 2013-10-26 DIAGNOSIS — L97409 Non-pressure chronic ulcer of unspecified heel and midfoot with unspecified severity: Secondary | ICD-10-CM | POA: Diagnosis not present

## 2013-10-26 DIAGNOSIS — E1149 Type 2 diabetes mellitus with other diabetic neurological complication: Secondary | ICD-10-CM | POA: Diagnosis not present

## 2013-10-26 DIAGNOSIS — E119 Type 2 diabetes mellitus without complications: Secondary | ICD-10-CM | POA: Diagnosis not present

## 2013-10-27 DIAGNOSIS — S88119A Complete traumatic amputation at level between knee and ankle, unspecified lower leg, initial encounter: Secondary | ICD-10-CM | POA: Diagnosis not present

## 2013-10-27 DIAGNOSIS — I5030 Unspecified diastolic (congestive) heart failure: Secondary | ICD-10-CM | POA: Diagnosis not present

## 2013-10-27 DIAGNOSIS — I10 Essential (primary) hypertension: Secondary | ICD-10-CM | POA: Diagnosis not present

## 2013-10-27 DIAGNOSIS — M86679 Other chronic osteomyelitis, unspecified ankle and foot: Secondary | ICD-10-CM | POA: Diagnosis not present

## 2013-10-27 DIAGNOSIS — E119 Type 2 diabetes mellitus without complications: Secondary | ICD-10-CM | POA: Diagnosis not present

## 2013-10-30 DIAGNOSIS — S88119A Complete traumatic amputation at level between knee and ankle, unspecified lower leg, initial encounter: Secondary | ICD-10-CM | POA: Diagnosis not present

## 2013-10-30 DIAGNOSIS — M86679 Other chronic osteomyelitis, unspecified ankle and foot: Secondary | ICD-10-CM | POA: Diagnosis not present

## 2013-10-30 DIAGNOSIS — I10 Essential (primary) hypertension: Secondary | ICD-10-CM | POA: Diagnosis not present

## 2013-10-30 DIAGNOSIS — I5030 Unspecified diastolic (congestive) heart failure: Secondary | ICD-10-CM | POA: Diagnosis not present

## 2013-10-30 DIAGNOSIS — E119 Type 2 diabetes mellitus without complications: Secondary | ICD-10-CM | POA: Diagnosis not present

## 2013-10-31 DIAGNOSIS — E119 Type 2 diabetes mellitus without complications: Secondary | ICD-10-CM | POA: Diagnosis not present

## 2013-10-31 DIAGNOSIS — S88119A Complete traumatic amputation at level between knee and ankle, unspecified lower leg, initial encounter: Secondary | ICD-10-CM | POA: Diagnosis not present

## 2013-10-31 DIAGNOSIS — M86679 Other chronic osteomyelitis, unspecified ankle and foot: Secondary | ICD-10-CM | POA: Diagnosis not present

## 2013-10-31 DIAGNOSIS — I5030 Unspecified diastolic (congestive) heart failure: Secondary | ICD-10-CM | POA: Diagnosis not present

## 2013-10-31 DIAGNOSIS — I10 Essential (primary) hypertension: Secondary | ICD-10-CM | POA: Diagnosis not present

## 2013-11-01 DIAGNOSIS — S88119A Complete traumatic amputation at level between knee and ankle, unspecified lower leg, initial encounter: Secondary | ICD-10-CM | POA: Diagnosis not present

## 2013-11-01 DIAGNOSIS — E119 Type 2 diabetes mellitus without complications: Secondary | ICD-10-CM | POA: Diagnosis not present

## 2013-11-01 DIAGNOSIS — I5030 Unspecified diastolic (congestive) heart failure: Secondary | ICD-10-CM | POA: Diagnosis not present

## 2013-11-01 DIAGNOSIS — I10 Essential (primary) hypertension: Secondary | ICD-10-CM | POA: Diagnosis not present

## 2013-11-01 DIAGNOSIS — M86679 Other chronic osteomyelitis, unspecified ankle and foot: Secondary | ICD-10-CM | POA: Diagnosis not present

## 2013-11-02 DIAGNOSIS — S88119A Complete traumatic amputation at level between knee and ankle, unspecified lower leg, initial encounter: Secondary | ICD-10-CM | POA: Diagnosis not present

## 2013-11-02 DIAGNOSIS — I5030 Unspecified diastolic (congestive) heart failure: Secondary | ICD-10-CM | POA: Diagnosis not present

## 2013-11-02 DIAGNOSIS — L97409 Non-pressure chronic ulcer of unspecified heel and midfoot with unspecified severity: Secondary | ICD-10-CM | POA: Diagnosis not present

## 2013-11-02 DIAGNOSIS — Z79899 Other long term (current) drug therapy: Secondary | ICD-10-CM | POA: Diagnosis not present

## 2013-11-02 DIAGNOSIS — M86679 Other chronic osteomyelitis, unspecified ankle and foot: Secondary | ICD-10-CM | POA: Diagnosis not present

## 2013-11-02 DIAGNOSIS — E119 Type 2 diabetes mellitus without complications: Secondary | ICD-10-CM | POA: Diagnosis not present

## 2013-11-02 DIAGNOSIS — L97209 Non-pressure chronic ulcer of unspecified calf with unspecified severity: Secondary | ICD-10-CM | POA: Diagnosis not present

## 2013-11-02 DIAGNOSIS — I10 Essential (primary) hypertension: Secondary | ICD-10-CM | POA: Diagnosis not present

## 2013-11-02 DIAGNOSIS — E1149 Type 2 diabetes mellitus with other diabetic neurological complication: Secondary | ICD-10-CM | POA: Diagnosis not present

## 2013-11-03 DIAGNOSIS — I5030 Unspecified diastolic (congestive) heart failure: Secondary | ICD-10-CM | POA: Diagnosis not present

## 2013-11-03 DIAGNOSIS — M86679 Other chronic osteomyelitis, unspecified ankle and foot: Secondary | ICD-10-CM | POA: Diagnosis not present

## 2013-11-03 DIAGNOSIS — S88119A Complete traumatic amputation at level between knee and ankle, unspecified lower leg, initial encounter: Secondary | ICD-10-CM | POA: Diagnosis not present

## 2013-11-03 DIAGNOSIS — I10 Essential (primary) hypertension: Secondary | ICD-10-CM | POA: Diagnosis not present

## 2013-11-03 DIAGNOSIS — E119 Type 2 diabetes mellitus without complications: Secondary | ICD-10-CM | POA: Diagnosis not present

## 2013-11-06 DIAGNOSIS — E119 Type 2 diabetes mellitus without complications: Secondary | ICD-10-CM | POA: Diagnosis not present

## 2013-11-06 DIAGNOSIS — S88119A Complete traumatic amputation at level between knee and ankle, unspecified lower leg, initial encounter: Secondary | ICD-10-CM | POA: Diagnosis not present

## 2013-11-06 DIAGNOSIS — I5030 Unspecified diastolic (congestive) heart failure: Secondary | ICD-10-CM | POA: Diagnosis not present

## 2013-11-06 DIAGNOSIS — M86679 Other chronic osteomyelitis, unspecified ankle and foot: Secondary | ICD-10-CM | POA: Diagnosis not present

## 2013-11-06 DIAGNOSIS — I10 Essential (primary) hypertension: Secondary | ICD-10-CM | POA: Diagnosis not present

## 2013-11-07 DIAGNOSIS — M86679 Other chronic osteomyelitis, unspecified ankle and foot: Secondary | ICD-10-CM | POA: Diagnosis not present

## 2013-11-07 DIAGNOSIS — I5030 Unspecified diastolic (congestive) heart failure: Secondary | ICD-10-CM | POA: Diagnosis not present

## 2013-11-07 DIAGNOSIS — I10 Essential (primary) hypertension: Secondary | ICD-10-CM | POA: Diagnosis not present

## 2013-11-07 DIAGNOSIS — S88119A Complete traumatic amputation at level between knee and ankle, unspecified lower leg, initial encounter: Secondary | ICD-10-CM | POA: Diagnosis not present

## 2013-11-07 DIAGNOSIS — E119 Type 2 diabetes mellitus without complications: Secondary | ICD-10-CM | POA: Diagnosis not present

## 2013-11-08 DIAGNOSIS — M86679 Other chronic osteomyelitis, unspecified ankle and foot: Secondary | ICD-10-CM | POA: Diagnosis not present

## 2013-11-08 DIAGNOSIS — I10 Essential (primary) hypertension: Secondary | ICD-10-CM | POA: Diagnosis not present

## 2013-11-08 DIAGNOSIS — E119 Type 2 diabetes mellitus without complications: Secondary | ICD-10-CM | POA: Diagnosis not present

## 2013-11-08 DIAGNOSIS — S88119A Complete traumatic amputation at level between knee and ankle, unspecified lower leg, initial encounter: Secondary | ICD-10-CM | POA: Diagnosis not present

## 2013-11-08 DIAGNOSIS — I5030 Unspecified diastolic (congestive) heart failure: Secondary | ICD-10-CM | POA: Diagnosis not present

## 2013-11-09 DIAGNOSIS — S88119A Complete traumatic amputation at level between knee and ankle, unspecified lower leg, initial encounter: Secondary | ICD-10-CM | POA: Diagnosis not present

## 2013-11-09 DIAGNOSIS — E119 Type 2 diabetes mellitus without complications: Secondary | ICD-10-CM | POA: Diagnosis not present

## 2013-11-09 DIAGNOSIS — E1149 Type 2 diabetes mellitus with other diabetic neurological complication: Secondary | ICD-10-CM | POA: Diagnosis not present

## 2013-11-09 DIAGNOSIS — I5030 Unspecified diastolic (congestive) heart failure: Secondary | ICD-10-CM | POA: Diagnosis not present

## 2013-11-09 DIAGNOSIS — L97209 Non-pressure chronic ulcer of unspecified calf with unspecified severity: Secondary | ICD-10-CM | POA: Diagnosis not present

## 2013-11-09 DIAGNOSIS — M86679 Other chronic osteomyelitis, unspecified ankle and foot: Secondary | ICD-10-CM | POA: Diagnosis not present

## 2013-11-09 DIAGNOSIS — I83219 Varicose veins of right lower extremity with both ulcer of unspecified site and inflammation: Secondary | ICD-10-CM | POA: Diagnosis not present

## 2013-11-09 DIAGNOSIS — L97409 Non-pressure chronic ulcer of unspecified heel and midfoot with unspecified severity: Secondary | ICD-10-CM | POA: Diagnosis not present

## 2013-11-09 DIAGNOSIS — I10 Essential (primary) hypertension: Secondary | ICD-10-CM | POA: Diagnosis not present

## 2013-11-09 DIAGNOSIS — L97929 Non-pressure chronic ulcer of unspecified part of left lower leg with unspecified severity: Secondary | ICD-10-CM | POA: Diagnosis not present

## 2013-11-10 DIAGNOSIS — I10 Essential (primary) hypertension: Secondary | ICD-10-CM | POA: Diagnosis not present

## 2013-11-10 DIAGNOSIS — S88119A Complete traumatic amputation at level between knee and ankle, unspecified lower leg, initial encounter: Secondary | ICD-10-CM | POA: Diagnosis not present

## 2013-11-10 DIAGNOSIS — M86679 Other chronic osteomyelitis, unspecified ankle and foot: Secondary | ICD-10-CM | POA: Diagnosis not present

## 2013-11-10 DIAGNOSIS — I5030 Unspecified diastolic (congestive) heart failure: Secondary | ICD-10-CM | POA: Diagnosis not present

## 2013-11-10 DIAGNOSIS — E119 Type 2 diabetes mellitus without complications: Secondary | ICD-10-CM | POA: Diagnosis not present

## 2013-11-13 DIAGNOSIS — I10 Essential (primary) hypertension: Secondary | ICD-10-CM | POA: Diagnosis not present

## 2013-11-13 DIAGNOSIS — M86679 Other chronic osteomyelitis, unspecified ankle and foot: Secondary | ICD-10-CM | POA: Diagnosis not present

## 2013-11-13 DIAGNOSIS — I5030 Unspecified diastolic (congestive) heart failure: Secondary | ICD-10-CM | POA: Diagnosis not present

## 2013-11-13 DIAGNOSIS — S88119A Complete traumatic amputation at level between knee and ankle, unspecified lower leg, initial encounter: Secondary | ICD-10-CM | POA: Diagnosis not present

## 2013-11-13 DIAGNOSIS — E119 Type 2 diabetes mellitus without complications: Secondary | ICD-10-CM | POA: Diagnosis not present

## 2013-11-14 DIAGNOSIS — I5030 Unspecified diastolic (congestive) heart failure: Secondary | ICD-10-CM | POA: Diagnosis not present

## 2013-11-14 DIAGNOSIS — I10 Essential (primary) hypertension: Secondary | ICD-10-CM | POA: Diagnosis not present

## 2013-11-14 DIAGNOSIS — S88119A Complete traumatic amputation at level between knee and ankle, unspecified lower leg, initial encounter: Secondary | ICD-10-CM | POA: Diagnosis not present

## 2013-11-14 DIAGNOSIS — M86679 Other chronic osteomyelitis, unspecified ankle and foot: Secondary | ICD-10-CM | POA: Diagnosis not present

## 2013-11-14 DIAGNOSIS — E119 Type 2 diabetes mellitus without complications: Secondary | ICD-10-CM | POA: Diagnosis not present

## 2013-11-15 DIAGNOSIS — M86679 Other chronic osteomyelitis, unspecified ankle and foot: Secondary | ICD-10-CM | POA: Diagnosis not present

## 2013-11-15 DIAGNOSIS — I10 Essential (primary) hypertension: Secondary | ICD-10-CM | POA: Diagnosis not present

## 2013-11-15 DIAGNOSIS — S88119A Complete traumatic amputation at level between knee and ankle, unspecified lower leg, initial encounter: Secondary | ICD-10-CM | POA: Diagnosis not present

## 2013-11-15 DIAGNOSIS — E119 Type 2 diabetes mellitus without complications: Secondary | ICD-10-CM | POA: Diagnosis not present

## 2013-11-15 DIAGNOSIS — I5030 Unspecified diastolic (congestive) heart failure: Secondary | ICD-10-CM | POA: Diagnosis not present

## 2013-11-16 DIAGNOSIS — I83219 Varicose veins of right lower extremity with both ulcer of unspecified site and inflammation: Secondary | ICD-10-CM | POA: Diagnosis not present

## 2013-11-16 DIAGNOSIS — I10 Essential (primary) hypertension: Secondary | ICD-10-CM | POA: Diagnosis not present

## 2013-11-16 DIAGNOSIS — S88119A Complete traumatic amputation at level between knee and ankle, unspecified lower leg, initial encounter: Secondary | ICD-10-CM | POA: Diagnosis not present

## 2013-11-16 DIAGNOSIS — E1149 Type 2 diabetes mellitus with other diabetic neurological complication: Secondary | ICD-10-CM | POA: Diagnosis not present

## 2013-11-16 DIAGNOSIS — I5030 Unspecified diastolic (congestive) heart failure: Secondary | ICD-10-CM | POA: Diagnosis not present

## 2013-11-16 DIAGNOSIS — M86679 Other chronic osteomyelitis, unspecified ankle and foot: Secondary | ICD-10-CM | POA: Diagnosis not present

## 2013-11-16 DIAGNOSIS — L97929 Non-pressure chronic ulcer of unspecified part of left lower leg with unspecified severity: Secondary | ICD-10-CM | POA: Diagnosis not present

## 2013-11-16 DIAGNOSIS — L97409 Non-pressure chronic ulcer of unspecified heel and midfoot with unspecified severity: Secondary | ICD-10-CM | POA: Diagnosis not present

## 2013-11-16 DIAGNOSIS — E119 Type 2 diabetes mellitus without complications: Secondary | ICD-10-CM | POA: Diagnosis not present

## 2013-11-17 DIAGNOSIS — E119 Type 2 diabetes mellitus without complications: Secondary | ICD-10-CM | POA: Diagnosis not present

## 2013-11-17 DIAGNOSIS — M6250 Muscle wasting and atrophy, not elsewhere classified, unspecified site: Secondary | ICD-10-CM | POA: Diagnosis not present

## 2013-11-17 DIAGNOSIS — M25569 Pain in unspecified knee: Secondary | ICD-10-CM | POA: Diagnosis not present

## 2013-11-17 DIAGNOSIS — E1149 Type 2 diabetes mellitus with other diabetic neurological complication: Secondary | ICD-10-CM | POA: Diagnosis not present

## 2013-11-17 DIAGNOSIS — M109 Gout, unspecified: Secondary | ICD-10-CM | POA: Diagnosis not present

## 2013-11-17 DIAGNOSIS — I5030 Unspecified diastolic (congestive) heart failure: Secondary | ICD-10-CM | POA: Diagnosis not present

## 2013-11-17 DIAGNOSIS — R489 Unspecified symbolic dysfunctions: Secondary | ICD-10-CM | POA: Diagnosis not present

## 2013-11-17 DIAGNOSIS — I10 Essential (primary) hypertension: Secondary | ICD-10-CM | POA: Diagnosis not present

## 2013-11-17 DIAGNOSIS — M6281 Muscle weakness (generalized): Secondary | ICD-10-CM | POA: Diagnosis not present

## 2013-11-17 DIAGNOSIS — M86679 Other chronic osteomyelitis, unspecified ankle and foot: Secondary | ICD-10-CM | POA: Diagnosis not present

## 2013-11-17 DIAGNOSIS — S88119A Complete traumatic amputation at level between knee and ankle, unspecified lower leg, initial encounter: Secondary | ICD-10-CM | POA: Diagnosis not present

## 2013-11-17 DIAGNOSIS — R262 Difficulty in walking, not elsewhere classified: Secondary | ICD-10-CM | POA: Diagnosis not present

## 2013-11-19 DIAGNOSIS — N390 Urinary tract infection, site not specified: Secondary | ICD-10-CM | POA: Diagnosis not present

## 2013-11-20 DIAGNOSIS — M109 Gout, unspecified: Secondary | ICD-10-CM | POA: Diagnosis not present

## 2013-11-20 DIAGNOSIS — I5030 Unspecified diastolic (congestive) heart failure: Secondary | ICD-10-CM | POA: Diagnosis not present

## 2013-11-20 DIAGNOSIS — M86679 Other chronic osteomyelitis, unspecified ankle and foot: Secondary | ICD-10-CM | POA: Diagnosis not present

## 2013-11-20 DIAGNOSIS — R262 Difficulty in walking, not elsewhere classified: Secondary | ICD-10-CM | POA: Diagnosis not present

## 2013-11-20 DIAGNOSIS — S88119A Complete traumatic amputation at level between knee and ankle, unspecified lower leg, initial encounter: Secondary | ICD-10-CM | POA: Diagnosis not present

## 2013-11-21 DIAGNOSIS — S88119A Complete traumatic amputation at level between knee and ankle, unspecified lower leg, initial encounter: Secondary | ICD-10-CM | POA: Diagnosis not present

## 2013-11-21 DIAGNOSIS — R262 Difficulty in walking, not elsewhere classified: Secondary | ICD-10-CM | POA: Diagnosis not present

## 2013-11-21 DIAGNOSIS — M86679 Other chronic osteomyelitis, unspecified ankle and foot: Secondary | ICD-10-CM | POA: Diagnosis not present

## 2013-11-21 DIAGNOSIS — M109 Gout, unspecified: Secondary | ICD-10-CM | POA: Diagnosis not present

## 2013-11-21 DIAGNOSIS — I5030 Unspecified diastolic (congestive) heart failure: Secondary | ICD-10-CM | POA: Diagnosis not present

## 2013-11-22 DIAGNOSIS — I5030 Unspecified diastolic (congestive) heart failure: Secondary | ICD-10-CM | POA: Diagnosis not present

## 2013-11-22 DIAGNOSIS — S88119A Complete traumatic amputation at level between knee and ankle, unspecified lower leg, initial encounter: Secondary | ICD-10-CM | POA: Diagnosis not present

## 2013-11-22 DIAGNOSIS — M86679 Other chronic osteomyelitis, unspecified ankle and foot: Secondary | ICD-10-CM | POA: Diagnosis not present

## 2013-11-22 DIAGNOSIS — R262 Difficulty in walking, not elsewhere classified: Secondary | ICD-10-CM | POA: Diagnosis not present

## 2013-11-22 DIAGNOSIS — M109 Gout, unspecified: Secondary | ICD-10-CM | POA: Diagnosis not present

## 2013-11-23 DIAGNOSIS — M109 Gout, unspecified: Secondary | ICD-10-CM | POA: Diagnosis not present

## 2013-11-23 DIAGNOSIS — M86679 Other chronic osteomyelitis, unspecified ankle and foot: Secondary | ICD-10-CM | POA: Diagnosis not present

## 2013-11-23 DIAGNOSIS — I5030 Unspecified diastolic (congestive) heart failure: Secondary | ICD-10-CM | POA: Diagnosis not present

## 2013-11-23 DIAGNOSIS — S88119A Complete traumatic amputation at level between knee and ankle, unspecified lower leg, initial encounter: Secondary | ICD-10-CM | POA: Diagnosis not present

## 2013-11-23 DIAGNOSIS — R262 Difficulty in walking, not elsewhere classified: Secondary | ICD-10-CM | POA: Diagnosis not present

## 2013-11-24 DIAGNOSIS — E1149 Type 2 diabetes mellitus with other diabetic neurological complication: Secondary | ICD-10-CM | POA: Diagnosis not present

## 2013-11-24 DIAGNOSIS — I83219 Varicose veins of right lower extremity with both ulcer of unspecified site and inflammation: Secondary | ICD-10-CM | POA: Diagnosis not present

## 2013-11-24 DIAGNOSIS — R262 Difficulty in walking, not elsewhere classified: Secondary | ICD-10-CM | POA: Diagnosis not present

## 2013-11-24 DIAGNOSIS — L97209 Non-pressure chronic ulcer of unspecified calf with unspecified severity: Secondary | ICD-10-CM | POA: Diagnosis not present

## 2013-11-24 DIAGNOSIS — L97929 Non-pressure chronic ulcer of unspecified part of left lower leg with unspecified severity: Secondary | ICD-10-CM | POA: Diagnosis not present

## 2013-11-24 DIAGNOSIS — L97409 Non-pressure chronic ulcer of unspecified heel and midfoot with unspecified severity: Secondary | ICD-10-CM | POA: Diagnosis not present

## 2013-11-24 DIAGNOSIS — M109 Gout, unspecified: Secondary | ICD-10-CM | POA: Diagnosis not present

## 2013-11-24 DIAGNOSIS — S88119A Complete traumatic amputation at level between knee and ankle, unspecified lower leg, initial encounter: Secondary | ICD-10-CM | POA: Diagnosis not present

## 2013-11-24 DIAGNOSIS — I5030 Unspecified diastolic (congestive) heart failure: Secondary | ICD-10-CM | POA: Diagnosis not present

## 2013-11-24 DIAGNOSIS — M86679 Other chronic osteomyelitis, unspecified ankle and foot: Secondary | ICD-10-CM | POA: Diagnosis not present

## 2013-11-27 DIAGNOSIS — S88119A Complete traumatic amputation at level between knee and ankle, unspecified lower leg, initial encounter: Secondary | ICD-10-CM | POA: Diagnosis not present

## 2013-11-27 DIAGNOSIS — M86679 Other chronic osteomyelitis, unspecified ankle and foot: Secondary | ICD-10-CM | POA: Diagnosis not present

## 2013-11-27 DIAGNOSIS — M109 Gout, unspecified: Secondary | ICD-10-CM | POA: Diagnosis not present

## 2013-11-27 DIAGNOSIS — I5030 Unspecified diastolic (congestive) heart failure: Secondary | ICD-10-CM | POA: Diagnosis not present

## 2013-11-27 DIAGNOSIS — R262 Difficulty in walking, not elsewhere classified: Secondary | ICD-10-CM | POA: Diagnosis not present

## 2013-11-28 DIAGNOSIS — M109 Gout, unspecified: Secondary | ICD-10-CM | POA: Diagnosis not present

## 2013-11-28 DIAGNOSIS — R262 Difficulty in walking, not elsewhere classified: Secondary | ICD-10-CM | POA: Diagnosis not present

## 2013-11-28 DIAGNOSIS — I5030 Unspecified diastolic (congestive) heart failure: Secondary | ICD-10-CM | POA: Diagnosis not present

## 2013-11-28 DIAGNOSIS — M86679 Other chronic osteomyelitis, unspecified ankle and foot: Secondary | ICD-10-CM | POA: Diagnosis not present

## 2013-11-28 DIAGNOSIS — S88119A Complete traumatic amputation at level between knee and ankle, unspecified lower leg, initial encounter: Secondary | ICD-10-CM | POA: Diagnosis not present

## 2013-11-29 DIAGNOSIS — I5030 Unspecified diastolic (congestive) heart failure: Secondary | ICD-10-CM | POA: Diagnosis not present

## 2013-11-29 DIAGNOSIS — S88119A Complete traumatic amputation at level between knee and ankle, unspecified lower leg, initial encounter: Secondary | ICD-10-CM | POA: Diagnosis not present

## 2013-11-29 DIAGNOSIS — M109 Gout, unspecified: Secondary | ICD-10-CM | POA: Diagnosis not present

## 2013-11-29 DIAGNOSIS — M86679 Other chronic osteomyelitis, unspecified ankle and foot: Secondary | ICD-10-CM | POA: Diagnosis not present

## 2013-11-29 DIAGNOSIS — R262 Difficulty in walking, not elsewhere classified: Secondary | ICD-10-CM | POA: Diagnosis not present

## 2013-11-30 DIAGNOSIS — I83219 Varicose veins of right lower extremity with both ulcer of unspecified site and inflammation: Secondary | ICD-10-CM | POA: Diagnosis not present

## 2013-11-30 DIAGNOSIS — R262 Difficulty in walking, not elsewhere classified: Secondary | ICD-10-CM | POA: Diagnosis not present

## 2013-11-30 DIAGNOSIS — L97929 Non-pressure chronic ulcer of unspecified part of left lower leg with unspecified severity: Secondary | ICD-10-CM | POA: Diagnosis not present

## 2013-11-30 DIAGNOSIS — M109 Gout, unspecified: Secondary | ICD-10-CM | POA: Diagnosis not present

## 2013-11-30 DIAGNOSIS — L97209 Non-pressure chronic ulcer of unspecified calf with unspecified severity: Secondary | ICD-10-CM | POA: Diagnosis not present

## 2013-11-30 DIAGNOSIS — L97409 Non-pressure chronic ulcer of unspecified heel and midfoot with unspecified severity: Secondary | ICD-10-CM | POA: Diagnosis not present

## 2013-11-30 DIAGNOSIS — I5030 Unspecified diastolic (congestive) heart failure: Secondary | ICD-10-CM | POA: Diagnosis not present

## 2013-11-30 DIAGNOSIS — E1149 Type 2 diabetes mellitus with other diabetic neurological complication: Secondary | ICD-10-CM | POA: Diagnosis not present

## 2013-11-30 DIAGNOSIS — S88119A Complete traumatic amputation at level between knee and ankle, unspecified lower leg, initial encounter: Secondary | ICD-10-CM | POA: Diagnosis not present

## 2013-11-30 DIAGNOSIS — M86679 Other chronic osteomyelitis, unspecified ankle and foot: Secondary | ICD-10-CM | POA: Diagnosis not present

## 2013-12-03 DIAGNOSIS — I10 Essential (primary) hypertension: Secondary | ICD-10-CM | POA: Diagnosis not present

## 2013-12-03 DIAGNOSIS — Z4789 Encounter for other orthopedic aftercare: Secondary | ICD-10-CM | POA: Diagnosis not present

## 2013-12-03 DIAGNOSIS — E119 Type 2 diabetes mellitus without complications: Secondary | ICD-10-CM | POA: Diagnosis not present

## 2013-12-03 DIAGNOSIS — G4733 Obstructive sleep apnea (adult) (pediatric): Secondary | ICD-10-CM | POA: Diagnosis not present

## 2013-12-03 DIAGNOSIS — I5030 Unspecified diastolic (congestive) heart failure: Secondary | ICD-10-CM | POA: Diagnosis not present

## 2013-12-03 DIAGNOSIS — Z794 Long term (current) use of insulin: Secondary | ICD-10-CM | POA: Diagnosis not present

## 2013-12-04 DIAGNOSIS — Z794 Long term (current) use of insulin: Secondary | ICD-10-CM | POA: Diagnosis not present

## 2013-12-04 DIAGNOSIS — I10 Essential (primary) hypertension: Secondary | ICD-10-CM | POA: Diagnosis not present

## 2013-12-04 DIAGNOSIS — G4733 Obstructive sleep apnea (adult) (pediatric): Secondary | ICD-10-CM | POA: Diagnosis not present

## 2013-12-04 DIAGNOSIS — I5030 Unspecified diastolic (congestive) heart failure: Secondary | ICD-10-CM | POA: Diagnosis not present

## 2013-12-04 DIAGNOSIS — Z4789 Encounter for other orthopedic aftercare: Secondary | ICD-10-CM | POA: Diagnosis not present

## 2013-12-04 DIAGNOSIS — E119 Type 2 diabetes mellitus without complications: Secondary | ICD-10-CM | POA: Diagnosis not present

## 2013-12-05 DIAGNOSIS — E119 Type 2 diabetes mellitus without complications: Secondary | ICD-10-CM | POA: Diagnosis not present

## 2013-12-05 DIAGNOSIS — Z4789 Encounter for other orthopedic aftercare: Secondary | ICD-10-CM | POA: Diagnosis not present

## 2013-12-05 DIAGNOSIS — I10 Essential (primary) hypertension: Secondary | ICD-10-CM | POA: Diagnosis not present

## 2013-12-05 DIAGNOSIS — G4733 Obstructive sleep apnea (adult) (pediatric): Secondary | ICD-10-CM | POA: Diagnosis not present

## 2013-12-05 DIAGNOSIS — I5030 Unspecified diastolic (congestive) heart failure: Secondary | ICD-10-CM | POA: Diagnosis not present

## 2013-12-05 DIAGNOSIS — Z794 Long term (current) use of insulin: Secondary | ICD-10-CM | POA: Diagnosis not present

## 2013-12-06 DIAGNOSIS — L97209 Non-pressure chronic ulcer of unspecified calf with unspecified severity: Secondary | ICD-10-CM | POA: Diagnosis not present

## 2013-12-06 DIAGNOSIS — Z794 Long term (current) use of insulin: Secondary | ICD-10-CM | POA: Diagnosis not present

## 2013-12-06 DIAGNOSIS — Z4789 Encounter for other orthopedic aftercare: Secondary | ICD-10-CM | POA: Diagnosis not present

## 2013-12-06 DIAGNOSIS — G4733 Obstructive sleep apnea (adult) (pediatric): Secondary | ICD-10-CM | POA: Diagnosis not present

## 2013-12-06 DIAGNOSIS — I10 Essential (primary) hypertension: Secondary | ICD-10-CM | POA: Diagnosis not present

## 2013-12-06 DIAGNOSIS — I83219 Varicose veins of right lower extremity with both ulcer of unspecified site and inflammation: Secondary | ICD-10-CM | POA: Diagnosis not present

## 2013-12-06 DIAGNOSIS — E119 Type 2 diabetes mellitus without complications: Secondary | ICD-10-CM | POA: Diagnosis not present

## 2013-12-06 DIAGNOSIS — E1149 Type 2 diabetes mellitus with other diabetic neurological complication: Secondary | ICD-10-CM | POA: Diagnosis not present

## 2013-12-06 DIAGNOSIS — I5030 Unspecified diastolic (congestive) heart failure: Secondary | ICD-10-CM | POA: Diagnosis not present

## 2013-12-06 DIAGNOSIS — L97409 Non-pressure chronic ulcer of unspecified heel and midfoot with unspecified severity: Secondary | ICD-10-CM | POA: Diagnosis not present

## 2013-12-06 DIAGNOSIS — L97929 Non-pressure chronic ulcer of unspecified part of left lower leg with unspecified severity: Secondary | ICD-10-CM | POA: Diagnosis not present

## 2013-12-07 DIAGNOSIS — E119 Type 2 diabetes mellitus without complications: Secondary | ICD-10-CM | POA: Diagnosis not present

## 2013-12-07 DIAGNOSIS — Z4789 Encounter for other orthopedic aftercare: Secondary | ICD-10-CM | POA: Diagnosis not present

## 2013-12-07 DIAGNOSIS — G4733 Obstructive sleep apnea (adult) (pediatric): Secondary | ICD-10-CM | POA: Diagnosis not present

## 2013-12-07 DIAGNOSIS — Z794 Long term (current) use of insulin: Secondary | ICD-10-CM | POA: Diagnosis not present

## 2013-12-07 DIAGNOSIS — I5030 Unspecified diastolic (congestive) heart failure: Secondary | ICD-10-CM | POA: Diagnosis not present

## 2013-12-07 DIAGNOSIS — I10 Essential (primary) hypertension: Secondary | ICD-10-CM | POA: Diagnosis not present

## 2013-12-08 DIAGNOSIS — Z794 Long term (current) use of insulin: Secondary | ICD-10-CM | POA: Diagnosis not present

## 2013-12-08 DIAGNOSIS — G4733 Obstructive sleep apnea (adult) (pediatric): Secondary | ICD-10-CM | POA: Diagnosis not present

## 2013-12-08 DIAGNOSIS — I5030 Unspecified diastolic (congestive) heart failure: Secondary | ICD-10-CM | POA: Diagnosis not present

## 2013-12-08 DIAGNOSIS — N390 Urinary tract infection, site not specified: Secondary | ICD-10-CM | POA: Diagnosis not present

## 2013-12-08 DIAGNOSIS — E119 Type 2 diabetes mellitus without complications: Secondary | ICD-10-CM | POA: Diagnosis not present

## 2013-12-08 DIAGNOSIS — I10 Essential (primary) hypertension: Secondary | ICD-10-CM | POA: Diagnosis not present

## 2013-12-08 DIAGNOSIS — Z4789 Encounter for other orthopedic aftercare: Secondary | ICD-10-CM | POA: Diagnosis not present

## 2013-12-08 DIAGNOSIS — Z139 Encounter for screening, unspecified: Secondary | ICD-10-CM | POA: Diagnosis not present

## 2013-12-08 DIAGNOSIS — E118 Type 2 diabetes mellitus with unspecified complications: Secondary | ICD-10-CM | POA: Diagnosis not present

## 2013-12-08 DIAGNOSIS — Z1331 Encounter for screening for depression: Secondary | ICD-10-CM | POA: Diagnosis not present

## 2013-12-08 DIAGNOSIS — M109 Gout, unspecified: Secondary | ICD-10-CM | POA: Diagnosis not present

## 2013-12-11 DIAGNOSIS — Z794 Long term (current) use of insulin: Secondary | ICD-10-CM | POA: Diagnosis not present

## 2013-12-11 DIAGNOSIS — G4733 Obstructive sleep apnea (adult) (pediatric): Secondary | ICD-10-CM | POA: Diagnosis not present

## 2013-12-11 DIAGNOSIS — Z4789 Encounter for other orthopedic aftercare: Secondary | ICD-10-CM | POA: Diagnosis not present

## 2013-12-11 DIAGNOSIS — I10 Essential (primary) hypertension: Secondary | ICD-10-CM | POA: Diagnosis not present

## 2013-12-11 DIAGNOSIS — I5030 Unspecified diastolic (congestive) heart failure: Secondary | ICD-10-CM | POA: Diagnosis not present

## 2013-12-11 DIAGNOSIS — E119 Type 2 diabetes mellitus without complications: Secondary | ICD-10-CM | POA: Diagnosis not present

## 2013-12-12 ENCOUNTER — Other Ambulatory Visit (HOSPITAL_COMMUNITY): Payer: Self-pay | Admitting: Cardiology

## 2013-12-12 DIAGNOSIS — I5032 Chronic diastolic (congestive) heart failure: Secondary | ICD-10-CM

## 2013-12-12 DIAGNOSIS — E119 Type 2 diabetes mellitus without complications: Secondary | ICD-10-CM | POA: Diagnosis not present

## 2013-12-12 DIAGNOSIS — I10 Essential (primary) hypertension: Secondary | ICD-10-CM | POA: Diagnosis not present

## 2013-12-12 DIAGNOSIS — G4733 Obstructive sleep apnea (adult) (pediatric): Secondary | ICD-10-CM | POA: Diagnosis not present

## 2013-12-12 DIAGNOSIS — Z794 Long term (current) use of insulin: Secondary | ICD-10-CM | POA: Diagnosis not present

## 2013-12-12 DIAGNOSIS — Z4789 Encounter for other orthopedic aftercare: Secondary | ICD-10-CM | POA: Diagnosis not present

## 2013-12-12 DIAGNOSIS — I5030 Unspecified diastolic (congestive) heart failure: Secondary | ICD-10-CM | POA: Diagnosis not present

## 2013-12-13 ENCOUNTER — Ambulatory Visit (HOSPITAL_COMMUNITY)
Admission: RE | Admit: 2013-12-13 | Discharge: 2013-12-13 | Disposition: A | Discharge status: Home / Self Care | Payer: Medicare Other | Source: Ambulatory Visit | Attending: Family Medicine | Admitting: Family Medicine

## 2013-12-13 ENCOUNTER — Ambulatory Visit (HOSPITAL_BASED_OUTPATIENT_CLINIC_OR_DEPARTMENT_OTHER)
Admission: RE | Admit: 2013-12-13 | Discharge: 2013-12-13 | Disposition: A | Discharge status: Home / Self Care | Payer: Medicare Other | Source: Ambulatory Visit | Attending: Internal Medicine | Admitting: Internal Medicine

## 2013-12-13 ENCOUNTER — Encounter: Payer: Self-pay | Admitting: Pulmonary Disease

## 2013-12-13 ENCOUNTER — Encounter (HOSPITAL_COMMUNITY): Payer: Self-pay

## 2013-12-13 ENCOUNTER — Ambulatory Visit (INDEPENDENT_AMBULATORY_CARE_PROVIDER_SITE_OTHER): Payer: Medicare Other | Admitting: Pulmonary Disease

## 2013-12-13 VITALS — BP 118/50 | HR 63 | Wt 236.4 lb

## 2013-12-13 VITALS — BP 112/58 | HR 64 | Ht 62.5 in | Wt 236.0 lb

## 2013-12-13 DIAGNOSIS — I6529 Occlusion and stenosis of unspecified carotid artery: Secondary | ICD-10-CM | POA: Diagnosis not present

## 2013-12-13 DIAGNOSIS — I5032 Chronic diastolic (congestive) heart failure: Secondary | ICD-10-CM | POA: Diagnosis not present

## 2013-12-13 DIAGNOSIS — E678 Other specified hyperalimentation: Secondary | ICD-10-CM

## 2013-12-13 DIAGNOSIS — I509 Heart failure, unspecified: Secondary | ICD-10-CM | POA: Diagnosis not present

## 2013-12-13 DIAGNOSIS — I10 Essential (primary) hypertension: Secondary | ICD-10-CM | POA: Diagnosis not present

## 2013-12-13 DIAGNOSIS — I517 Cardiomegaly: Secondary | ICD-10-CM

## 2013-12-13 MED ORDER — TORSEMIDE 20 MG PO TABS: 40.0000 mg | ORAL_TABLET | Freq: Every day | ORAL | Status: DC

## 2013-12-13 NOTE — Progress Notes (Signed)
   Subjective:    Patient ID: [name], female    DOB: August 08, 1945, 68 y.o.   MRN: [id number]  HPI The patient comes in today for followup of her obesity hypoventilation syndrome. She is doing very well on bilevel, but has had some issues with excessive water and humidity. She was not aware of turning the heat down, and therefore has discontinued her humidifier. This has resulted in dry mouth and lips. She feels that she has done well with the device, and has lost over 20 pounds since last visit   Review of Systems  Constitutional: Negative for fever and unexpected weight change.  HENT: Negative for congestion, dental problem, ear pain, nosebleeds, postnasal drip, rhinorrhea, sinus pressure, sneezing, sore throat and trouble swallowing.   Eyes: Negative for redness and itching.  Respiratory: Negative for cough, chest tightness, shortness of breath and wheezing.   Cardiovascular: Negative for palpitations and leg swelling.  Gastrointestinal: Negative for nausea and vomiting.  Genitourinary: Negative for dysuria.  Musculoskeletal: Negative for joint swelling.  Skin: Negative for rash.  Neurological: Negative for headaches.  Hematological: Does not bruise/bleed easily.  Psychiatric/Behavioral: Negative for dysphoric mood. The patient is not nervous/anxious.        Objective:   Physical Exam Obese female in no acute distress Nose without purulence or discharge noted No skin breakdown or pressure necrosis from the CPAP mask Neck without lymphadenopathy or thyromegaly Chest totally clear to auscultation, no wheezing Cardiac exam with regular rate and rhythm Alert and oriented, moves all 4 extremities.       Assessment & Plan:

## 2013-12-13 NOTE — Patient Instructions (Signed)
Continue with bipap, and keep up with the mask changes and supplies. Turn your heat down on the humidifier, but let me know if you are still having excessive moisture issues. Keep working on weight loss, you are doing great.  followup with me again in 73mos.

## 2013-12-13 NOTE — Assessment & Plan Note (Signed)
The patient continues to do well with her bilevel device, and is losing weight consistently. I have commended her on this, and asked her to continue. I've also asked her to turn down the heat on her humidifier in order to decrease the humidity. She may need a climate control tubing this continues to be an issue.

## 2013-12-13 NOTE — Progress Notes (Addendum)
Patient ID: [name], female   DOB: 05-15-1946, 68 y.o.   MRN: [id number] Primary Cardiologist:  Dr. Glori Bickers PCP: Dr. Joan Flores in [place]  HPI: [name] is a 68 y.o. female with a history of morbid obesity complicated by obesity hypoventilation syndrome, hypertension, sleep apnea, diabetes, CRI, diastolic HF and mild secondary pulmonary hypertension. Carotid stenosis 40-59% bilaterally.    Initially, she had an echocardiogram read as severe LV dysfunction with an EF of 15-20%. She underwent catheterization in 2008 that showed minimal nonobstructive coronary artery disease with EF 55%. She had mild pulmonary hypertension with both pulmonary venous and pulmonary arterial components. Her PVR was 5.1 Wood units. Echo in 3/09 EF 60%.    Admitted to [facility] in 2013 due to hyperkalemia and acute renal failure. This was thought to be from Bactroban and spironolactone. Evaluated by Dr Macarthur Critchley. Recommendations to stop spironolactone/ARB.   In 11/14 underwent L transtibial amputation. Continues to see Dr. Sharol Given for wound on RLE  She returns for follow up. Denies SOB/PND. Weight down almost 40 pounds with strict diet. BP much improved. Still takes demdex 60 daily. Denies dizziness. No orthopnea or PND. No edema.   Echo 12/13/13: EF 40-45% with anteroseptal HK. grade 3 DD RV ok   Scheduled to see Dr. Simone Curia on Friday for blood work   Labs  02/07/13 K 4.1 Creatinine 1.2  12/14 Cr 3.1   She is not on spironolactone/ARB due to acute renal failure and hyperkalemia.   ROS: All systems negative except as listed in HPI, PMH and Problem List.  Past Medical History  Diagnosis Date  . OSA (obstructive sleep apnea)   . Thrombophlebitis of deep vein of leg   . DM (diabetes mellitus)   . CHF (congestive heart failure)     hx of EF 15-20% in 4/08,  echo 3/09 EF 60-65%;   echo 6/12: EF 55-60%, mild LAE  . Carotid stenosis     u/s 6/10: R 0-39%Ll 40-59%;  u/s 7/12: 40-59% bilateral (repeat in  7/13)  . HTN (hypertension)   . Morbid obesity   . Pulmonary HTN     mild,  cath 6/08 with PVR 5.1  . Hypokalemia   . 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 Spinal stenosis   . Obesity hypoventilation syndrome   . Cancer     hx of colon CA 2000  . Arthritis      Current Outpatient Prescriptions on File Prior to Encounter  Medication Sig Dispense Refill  . allopurinol (ZYLOPRIM) 300 MG tablet Take 300 mg by mouth daily.      Marland Kitchen aspirin (BAYER ASPIRIN) 325 MG tablet Take 325 mg by mouth daily.        . carvedilol (COREG) 3.125 MG tablet Take 3.125 mg by mouth 2 (two) times daily with a meal.       . cyanocobalamin (,VITAMIN B-12,) 1000 MCG/ML injection Inject 1,000 mcg into the muscle every 30 (thirty) days.       . insulin glargine (LANTUS) 100 UNIT/ML injection Inject 40 units in AM and 60 units in PM  10 mL  12  . insulin glulisine (APIDRA) 100 UNIT/ML injection Inject 5-20 Units into the skin 3 (three) times daily before meals. Sliding scale      . potassium chloride SA (K-DUR,KLOR-CON) 20 MEQ tablet Take 20 mEq by mouth daily.      . sertraline (ZOLOFT) 100 MG tablet Take 50 mg  by mouth daily.       . simvastatin (ZOCOR) 40 MG tablet Take 1 tablet (40 mg total) by mouth at bedtime.  90 tablet  2  . torsemide (DEMADEX) 20 MG tablet Take 3 tablets (60 mg total) by mouth daily.  90 tablet  6  . Vitamin D, Ergocalciferol, (DRISDOL) 50000 UNITS CAPS Take 50,000 Units by mouth 2 (two) times a week. Take on Mon and Thurs.       No current facility-administered medications on file prior to encounter.   Allergies  Allergen Reactions  . Codeine Other (See Comments)    Causes confusion  . Meperidine Hcl Other (See Comments)    Increase in heart rate (demerol)  . Metformin Nausea And Vomiting     PHYSICAL EXAM: Filed Vitals:   12/13/13 1228  BP: 118/50  Pulse: 63  Weight: 236 lb 6.4 oz (107.23 kg)  SpO2: 96%    General: Looks good sitting in WC. No  resp difficulty. Sister present HEENT: normal Neck: supple. JVP difficult to tell due to body habitus but does not appear elevated.   Carotids 2+ bilaterally; no bruits. No lymphadenopathy or thryomegaly appreciated. Cor: PMI normal. Regular rate & rhythm. No rubs, gallops or murmurs. Lungs: clear Abdomen: obese,  nontender, mildly distended. No hepatosplenomegaly. No bruits or masses. Good bowel sounds. Extremities:  No cyanosis or clubbing. RLE wrapped. LLE s/p amputation no edema Neuro: alert & orientedx3, cranial nerves grossly intact. Moves all 4 extremities w/o difficulty. Affect pleasant.  ASSESSMENT & PLAN:  1) Chronic systolic/diastolic HF - EF 74-08% - Volume status looks great. Will try to cut torsemide to 40 mg daily.  But watch weight VERY closely and if it starts to go back up at all go back to 60mg  daily.  - Continue daily weights, low sodium diet, and fluid restriction.  - doing great. Echo looks ok. Check labs with Dr. Simone Curia this week.  2) Carotid stenosis - Asx - Will schedule  3) HTN -Blood pressure well controlled. Continue current regimen.   F/u 6 months Jolaine Artist MD 12:34 PM

## 2013-12-13 NOTE — Progress Notes (Signed)
  Echocardiogram 2D Echocardiogram has been performed.  [name] 12/13/2013, 12:05 PM

## 2013-12-13 NOTE — Patient Instructions (Signed)
Decrease Torsemide to 40 mg (2 tabs) daily  Your physician has requested that you have a carotid duplex. This test is an ultrasound of the carotid arteries in your neck. It looks at blood flow through these arteries that supply the brain with blood. Allow one hour for this exam. There are no restrictions or special instructions.  We will contact you in 6 months to schedule your next appointment.

## 2013-12-13 NOTE — Addendum Note (Signed)
Encounter addended by: Scarlette Calico, RN on: 12/13/2013 12:55 PM<BR>     Documentation filed: Visit Diagnoses, Patient Instructions Section, Orders

## 2013-12-14 DIAGNOSIS — E1149 Type 2 diabetes mellitus with other diabetic neurological complication: Secondary | ICD-10-CM | POA: Diagnosis not present

## 2013-12-14 DIAGNOSIS — I83219 Varicose veins of right lower extremity with both ulcer of unspecified site and inflammation: Secondary | ICD-10-CM | POA: Diagnosis not present

## 2013-12-14 DIAGNOSIS — L97409 Non-pressure chronic ulcer of unspecified heel and midfoot with unspecified severity: Secondary | ICD-10-CM | POA: Diagnosis not present

## 2013-12-14 DIAGNOSIS — L97929 Non-pressure chronic ulcer of unspecified part of left lower leg with unspecified severity: Secondary | ICD-10-CM | POA: Diagnosis not present

## 2013-12-15 DIAGNOSIS — G4733 Obstructive sleep apnea (adult) (pediatric): Secondary | ICD-10-CM | POA: Diagnosis not present

## 2013-12-15 DIAGNOSIS — I10 Essential (primary) hypertension: Secondary | ICD-10-CM | POA: Diagnosis not present

## 2013-12-15 DIAGNOSIS — Z794 Long term (current) use of insulin: Secondary | ICD-10-CM | POA: Diagnosis not present

## 2013-12-15 DIAGNOSIS — E782 Mixed hyperlipidemia: Secondary | ICD-10-CM | POA: Diagnosis not present

## 2013-12-15 DIAGNOSIS — I5030 Unspecified diastolic (congestive) heart failure: Secondary | ICD-10-CM | POA: Diagnosis not present

## 2013-12-15 DIAGNOSIS — E119 Type 2 diabetes mellitus without complications: Secondary | ICD-10-CM | POA: Diagnosis not present

## 2013-12-15 DIAGNOSIS — E118 Type 2 diabetes mellitus with unspecified complications: Secondary | ICD-10-CM | POA: Diagnosis not present

## 2013-12-15 DIAGNOSIS — Z4789 Encounter for other orthopedic aftercare: Secondary | ICD-10-CM | POA: Diagnosis not present

## 2013-12-15 NOTE — Addendum Note (Signed)
Encounter addended by: Jolaine Artist, MD on: 12/15/2013 11:52 AM<BR>     Documentation filed: Notes Section

## 2013-12-18 DIAGNOSIS — G4733 Obstructive sleep apnea (adult) (pediatric): Secondary | ICD-10-CM | POA: Diagnosis not present

## 2013-12-18 DIAGNOSIS — I5030 Unspecified diastolic (congestive) heart failure: Secondary | ICD-10-CM | POA: Diagnosis not present

## 2013-12-18 DIAGNOSIS — Z794 Long term (current) use of insulin: Secondary | ICD-10-CM | POA: Diagnosis not present

## 2013-12-18 DIAGNOSIS — Z4789 Encounter for other orthopedic aftercare: Secondary | ICD-10-CM | POA: Diagnosis not present

## 2013-12-18 DIAGNOSIS — E119 Type 2 diabetes mellitus without complications: Secondary | ICD-10-CM | POA: Diagnosis not present

## 2013-12-18 DIAGNOSIS — I10 Essential (primary) hypertension: Secondary | ICD-10-CM | POA: Diagnosis not present

## 2013-12-19 ENCOUNTER — Ambulatory Visit (HOSPITAL_COMMUNITY)
Admission: RE | Admit: 2013-12-19 | Discharge: 2013-12-19 | Disposition: A | Discharge status: Home / Self Care | Payer: Medicare Other | Source: Ambulatory Visit | Attending: Internal Medicine | Admitting: Internal Medicine

## 2013-12-19 DIAGNOSIS — I6529 Occlusion and stenosis of unspecified carotid artery: Secondary | ICD-10-CM

## 2013-12-19 DIAGNOSIS — I779 Disorder of arteries and arterioles, unspecified: Secondary | ICD-10-CM | POA: Insufficient documentation

## 2013-12-19 NOTE — Progress Notes (Signed)
*  PRELIMINARY RESULTS* Vascular Ultrasound Carotid Duplex (Doppler) has been completed.  Preliminary findings: bilateral 40-59% internal carotid artery stenosis.    Landry Mellow, RDMS, RVT  12/19/2013, 10:32 AM

## 2013-12-20 DIAGNOSIS — Z4789 Encounter for other orthopedic aftercare: Secondary | ICD-10-CM | POA: Diagnosis not present

## 2013-12-20 DIAGNOSIS — E119 Type 2 diabetes mellitus without complications: Secondary | ICD-10-CM | POA: Diagnosis not present

## 2013-12-20 DIAGNOSIS — I5030 Unspecified diastolic (congestive) heart failure: Secondary | ICD-10-CM | POA: Diagnosis not present

## 2013-12-20 DIAGNOSIS — I10 Essential (primary) hypertension: Secondary | ICD-10-CM | POA: Diagnosis not present

## 2013-12-20 DIAGNOSIS — Z794 Long term (current) use of insulin: Secondary | ICD-10-CM | POA: Diagnosis not present

## 2013-12-20 DIAGNOSIS — G4733 Obstructive sleep apnea (adult) (pediatric): Secondary | ICD-10-CM | POA: Diagnosis not present

## 2013-12-21 DIAGNOSIS — Z4789 Encounter for other orthopedic aftercare: Secondary | ICD-10-CM | POA: Diagnosis not present

## 2013-12-21 DIAGNOSIS — E119 Type 2 diabetes mellitus without complications: Secondary | ICD-10-CM | POA: Diagnosis not present

## 2013-12-21 DIAGNOSIS — I5030 Unspecified diastolic (congestive) heart failure: Secondary | ICD-10-CM | POA: Diagnosis not present

## 2013-12-21 DIAGNOSIS — I10 Essential (primary) hypertension: Secondary | ICD-10-CM | POA: Diagnosis not present

## 2013-12-21 DIAGNOSIS — Z794 Long term (current) use of insulin: Secondary | ICD-10-CM | POA: Diagnosis not present

## 2013-12-21 DIAGNOSIS — G4733 Obstructive sleep apnea (adult) (pediatric): Secondary | ICD-10-CM | POA: Diagnosis not present

## 2013-12-25 DIAGNOSIS — Z4789 Encounter for other orthopedic aftercare: Secondary | ICD-10-CM | POA: Diagnosis not present

## 2013-12-25 DIAGNOSIS — Z794 Long term (current) use of insulin: Secondary | ICD-10-CM | POA: Diagnosis not present

## 2013-12-25 DIAGNOSIS — I5030 Unspecified diastolic (congestive) heart failure: Secondary | ICD-10-CM | POA: Diagnosis not present

## 2013-12-25 DIAGNOSIS — I10 Essential (primary) hypertension: Secondary | ICD-10-CM | POA: Diagnosis not present

## 2013-12-25 DIAGNOSIS — G4733 Obstructive sleep apnea (adult) (pediatric): Secondary | ICD-10-CM | POA: Diagnosis not present

## 2013-12-25 DIAGNOSIS — E119 Type 2 diabetes mellitus without complications: Secondary | ICD-10-CM | POA: Diagnosis not present

## 2013-12-27 DIAGNOSIS — G4733 Obstructive sleep apnea (adult) (pediatric): Secondary | ICD-10-CM | POA: Diagnosis not present

## 2013-12-27 DIAGNOSIS — I10 Essential (primary) hypertension: Secondary | ICD-10-CM | POA: Diagnosis not present

## 2013-12-27 DIAGNOSIS — I5030 Unspecified diastolic (congestive) heart failure: Secondary | ICD-10-CM | POA: Diagnosis not present

## 2013-12-27 DIAGNOSIS — Z4789 Encounter for other orthopedic aftercare: Secondary | ICD-10-CM | POA: Diagnosis not present

## 2013-12-27 DIAGNOSIS — E119 Type 2 diabetes mellitus without complications: Secondary | ICD-10-CM | POA: Diagnosis not present

## 2013-12-27 DIAGNOSIS — Z794 Long term (current) use of insulin: Secondary | ICD-10-CM | POA: Diagnosis not present

## 2013-12-28 DIAGNOSIS — L97209 Non-pressure chronic ulcer of unspecified calf with unspecified severity: Secondary | ICD-10-CM | POA: Diagnosis not present

## 2013-12-28 DIAGNOSIS — L97929 Non-pressure chronic ulcer of unspecified part of left lower leg with unspecified severity: Secondary | ICD-10-CM | POA: Diagnosis not present

## 2013-12-28 DIAGNOSIS — E1149 Type 2 diabetes mellitus with other diabetic neurological complication: Secondary | ICD-10-CM | POA: Diagnosis not present

## 2013-12-28 DIAGNOSIS — I83219 Varicose veins of right lower extremity with both ulcer of unspecified site and inflammation: Secondary | ICD-10-CM | POA: Diagnosis not present

## 2013-12-29 DIAGNOSIS — E782 Mixed hyperlipidemia: Secondary | ICD-10-CM | POA: Diagnosis not present

## 2013-12-29 DIAGNOSIS — I129 Hypertensive chronic kidney disease with stage 1 through stage 4 chronic kidney disease, or unspecified chronic kidney disease: Secondary | ICD-10-CM | POA: Diagnosis not present

## 2013-12-29 DIAGNOSIS — N183 Chronic kidney disease, stage 3 unspecified: Secondary | ICD-10-CM | POA: Diagnosis not present

## 2013-12-29 DIAGNOSIS — E118 Type 2 diabetes mellitus with unspecified complications: Secondary | ICD-10-CM | POA: Diagnosis not present

## 2014-01-08 DIAGNOSIS — I10 Essential (primary) hypertension: Secondary | ICD-10-CM | POA: Diagnosis not present

## 2014-01-08 DIAGNOSIS — G4733 Obstructive sleep apnea (adult) (pediatric): Secondary | ICD-10-CM | POA: Diagnosis not present

## 2014-01-08 DIAGNOSIS — I5030 Unspecified diastolic (congestive) heart failure: Secondary | ICD-10-CM | POA: Diagnosis not present

## 2014-01-08 DIAGNOSIS — E119 Type 2 diabetes mellitus without complications: Secondary | ICD-10-CM | POA: Diagnosis not present

## 2014-01-08 DIAGNOSIS — Z4789 Encounter for other orthopedic aftercare: Secondary | ICD-10-CM | POA: Diagnosis not present

## 2014-01-08 DIAGNOSIS — Z794 Long term (current) use of insulin: Secondary | ICD-10-CM | POA: Diagnosis not present

## 2014-01-09 DIAGNOSIS — I10 Essential (primary) hypertension: Secondary | ICD-10-CM | POA: Diagnosis not present

## 2014-01-09 DIAGNOSIS — E119 Type 2 diabetes mellitus without complications: Secondary | ICD-10-CM | POA: Diagnosis not present

## 2014-01-09 DIAGNOSIS — Z794 Long term (current) use of insulin: Secondary | ICD-10-CM | POA: Diagnosis not present

## 2014-01-09 DIAGNOSIS — G4733 Obstructive sleep apnea (adult) (pediatric): Secondary | ICD-10-CM | POA: Diagnosis not present

## 2014-01-09 DIAGNOSIS — Z4789 Encounter for other orthopedic aftercare: Secondary | ICD-10-CM | POA: Diagnosis not present

## 2014-01-09 DIAGNOSIS — I5030 Unspecified diastolic (congestive) heart failure: Secondary | ICD-10-CM | POA: Diagnosis not present

## 2014-01-10 DIAGNOSIS — E119 Type 2 diabetes mellitus without complications: Secondary | ICD-10-CM | POA: Diagnosis not present

## 2014-01-10 DIAGNOSIS — S88119A Complete traumatic amputation at level between knee and ankle, unspecified lower leg, initial encounter: Secondary | ICD-10-CM | POA: Diagnosis not present

## 2014-01-10 DIAGNOSIS — B351 Tinea unguium: Secondary | ICD-10-CM | POA: Diagnosis not present

## 2014-01-11 DIAGNOSIS — G4733 Obstructive sleep apnea (adult) (pediatric): Secondary | ICD-10-CM | POA: Diagnosis not present

## 2014-01-11 DIAGNOSIS — Z794 Long term (current) use of insulin: Secondary | ICD-10-CM | POA: Diagnosis not present

## 2014-01-11 DIAGNOSIS — E119 Type 2 diabetes mellitus without complications: Secondary | ICD-10-CM | POA: Diagnosis not present

## 2014-01-11 DIAGNOSIS — I5030 Unspecified diastolic (congestive) heart failure: Secondary | ICD-10-CM | POA: Diagnosis not present

## 2014-01-11 DIAGNOSIS — Z4789 Encounter for other orthopedic aftercare: Secondary | ICD-10-CM | POA: Diagnosis not present

## 2014-01-11 DIAGNOSIS — I10 Essential (primary) hypertension: Secondary | ICD-10-CM | POA: Diagnosis not present

## 2014-01-15 DIAGNOSIS — E119 Type 2 diabetes mellitus without complications: Secondary | ICD-10-CM | POA: Diagnosis not present

## 2014-01-15 DIAGNOSIS — G4733 Obstructive sleep apnea (adult) (pediatric): Secondary | ICD-10-CM | POA: Diagnosis not present

## 2014-01-15 DIAGNOSIS — Z4789 Encounter for other orthopedic aftercare: Secondary | ICD-10-CM | POA: Diagnosis not present

## 2014-01-15 DIAGNOSIS — Z794 Long term (current) use of insulin: Secondary | ICD-10-CM | POA: Diagnosis not present

## 2014-01-15 DIAGNOSIS — I10 Essential (primary) hypertension: Secondary | ICD-10-CM | POA: Diagnosis not present

## 2014-01-15 DIAGNOSIS — I5030 Unspecified diastolic (congestive) heart failure: Secondary | ICD-10-CM | POA: Diagnosis not present

## 2014-01-17 DIAGNOSIS — E119 Type 2 diabetes mellitus without complications: Secondary | ICD-10-CM | POA: Diagnosis not present

## 2014-01-17 DIAGNOSIS — Z794 Long term (current) use of insulin: Secondary | ICD-10-CM | POA: Diagnosis not present

## 2014-01-17 DIAGNOSIS — I10 Essential (primary) hypertension: Secondary | ICD-10-CM | POA: Diagnosis not present

## 2014-01-17 DIAGNOSIS — G4733 Obstructive sleep apnea (adult) (pediatric): Secondary | ICD-10-CM | POA: Diagnosis not present

## 2014-01-17 DIAGNOSIS — I5030 Unspecified diastolic (congestive) heart failure: Secondary | ICD-10-CM | POA: Diagnosis not present

## 2014-01-17 DIAGNOSIS — Z4789 Encounter for other orthopedic aftercare: Secondary | ICD-10-CM | POA: Diagnosis not present

## 2014-01-19 DIAGNOSIS — I5030 Unspecified diastolic (congestive) heart failure: Secondary | ICD-10-CM | POA: Diagnosis not present

## 2014-01-19 DIAGNOSIS — I10 Essential (primary) hypertension: Secondary | ICD-10-CM | POA: Diagnosis not present

## 2014-01-19 DIAGNOSIS — E119 Type 2 diabetes mellitus without complications: Secondary | ICD-10-CM | POA: Diagnosis not present

## 2014-01-19 DIAGNOSIS — Z4789 Encounter for other orthopedic aftercare: Secondary | ICD-10-CM | POA: Diagnosis not present

## 2014-01-19 DIAGNOSIS — G4733 Obstructive sleep apnea (adult) (pediatric): Secondary | ICD-10-CM | POA: Diagnosis not present

## 2014-01-19 DIAGNOSIS — Z794 Long term (current) use of insulin: Secondary | ICD-10-CM | POA: Diagnosis not present

## 2014-01-22 DIAGNOSIS — I5030 Unspecified diastolic (congestive) heart failure: Secondary | ICD-10-CM | POA: Diagnosis not present

## 2014-01-22 DIAGNOSIS — Z794 Long term (current) use of insulin: Secondary | ICD-10-CM | POA: Diagnosis not present

## 2014-01-22 DIAGNOSIS — G4733 Obstructive sleep apnea (adult) (pediatric): Secondary | ICD-10-CM | POA: Diagnosis not present

## 2014-01-22 DIAGNOSIS — E119 Type 2 diabetes mellitus without complications: Secondary | ICD-10-CM | POA: Diagnosis not present

## 2014-01-22 DIAGNOSIS — Z4789 Encounter for other orthopedic aftercare: Secondary | ICD-10-CM | POA: Diagnosis not present

## 2014-01-22 DIAGNOSIS — I10 Essential (primary) hypertension: Secondary | ICD-10-CM | POA: Diagnosis not present

## 2014-01-24 DIAGNOSIS — Z794 Long term (current) use of insulin: Secondary | ICD-10-CM | POA: Diagnosis not present

## 2014-01-24 DIAGNOSIS — I5030 Unspecified diastolic (congestive) heart failure: Secondary | ICD-10-CM | POA: Diagnosis not present

## 2014-01-24 DIAGNOSIS — E109 Type 1 diabetes mellitus without complications: Secondary | ICD-10-CM | POA: Diagnosis not present

## 2014-01-24 DIAGNOSIS — Z4789 Encounter for other orthopedic aftercare: Secondary | ICD-10-CM | POA: Diagnosis not present

## 2014-01-24 DIAGNOSIS — Z961 Presence of intraocular lens: Secondary | ICD-10-CM | POA: Diagnosis not present

## 2014-01-24 DIAGNOSIS — I10 Essential (primary) hypertension: Secondary | ICD-10-CM | POA: Diagnosis not present

## 2014-01-24 DIAGNOSIS — G4733 Obstructive sleep apnea (adult) (pediatric): Secondary | ICD-10-CM | POA: Diagnosis not present

## 2014-01-24 DIAGNOSIS — E119 Type 2 diabetes mellitus without complications: Secondary | ICD-10-CM | POA: Diagnosis not present

## 2014-01-25 DIAGNOSIS — I83229 Varicose veins of left lower extremity with both ulcer of unspecified site and inflammation: Secondary | ICD-10-CM | POA: Diagnosis not present

## 2014-01-25 DIAGNOSIS — I83219 Varicose veins of right lower extremity with both ulcer of unspecified site and inflammation: Secondary | ICD-10-CM | POA: Diagnosis not present

## 2014-01-25 DIAGNOSIS — L97409 Non-pressure chronic ulcer of unspecified heel and midfoot with unspecified severity: Secondary | ICD-10-CM | POA: Diagnosis not present

## 2014-01-25 DIAGNOSIS — E1149 Type 2 diabetes mellitus with other diabetic neurological complication: Secondary | ICD-10-CM | POA: Diagnosis not present

## 2014-01-25 DIAGNOSIS — M25469 Effusion, unspecified knee: Secondary | ICD-10-CM | POA: Diagnosis not present

## 2014-01-26 DIAGNOSIS — Z4789 Encounter for other orthopedic aftercare: Secondary | ICD-10-CM | POA: Diagnosis not present

## 2014-01-26 DIAGNOSIS — E119 Type 2 diabetes mellitus without complications: Secondary | ICD-10-CM | POA: Diagnosis not present

## 2014-01-26 DIAGNOSIS — I5030 Unspecified diastolic (congestive) heart failure: Secondary | ICD-10-CM | POA: Diagnosis not present

## 2014-01-26 DIAGNOSIS — I10 Essential (primary) hypertension: Secondary | ICD-10-CM | POA: Diagnosis not present

## 2014-01-26 DIAGNOSIS — G4733 Obstructive sleep apnea (adult) (pediatric): Secondary | ICD-10-CM | POA: Diagnosis not present

## 2014-01-26 DIAGNOSIS — Z794 Long term (current) use of insulin: Secondary | ICD-10-CM | POA: Diagnosis not present

## 2014-01-29 DIAGNOSIS — Z4789 Encounter for other orthopedic aftercare: Secondary | ICD-10-CM | POA: Diagnosis not present

## 2014-01-29 DIAGNOSIS — Z794 Long term (current) use of insulin: Secondary | ICD-10-CM | POA: Diagnosis not present

## 2014-01-29 DIAGNOSIS — I5030 Unspecified diastolic (congestive) heart failure: Secondary | ICD-10-CM | POA: Diagnosis not present

## 2014-01-29 DIAGNOSIS — G4733 Obstructive sleep apnea (adult) (pediatric): Secondary | ICD-10-CM | POA: Diagnosis not present

## 2014-01-29 DIAGNOSIS — E119 Type 2 diabetes mellitus without complications: Secondary | ICD-10-CM | POA: Diagnosis not present

## 2014-01-29 DIAGNOSIS — I10 Essential (primary) hypertension: Secondary | ICD-10-CM | POA: Diagnosis not present

## 2014-01-30 DIAGNOSIS — I5030 Unspecified diastolic (congestive) heart failure: Secondary | ICD-10-CM | POA: Diagnosis not present

## 2014-01-30 DIAGNOSIS — E119 Type 2 diabetes mellitus without complications: Secondary | ICD-10-CM | POA: Diagnosis not present

## 2014-01-30 DIAGNOSIS — G4733 Obstructive sleep apnea (adult) (pediatric): Secondary | ICD-10-CM | POA: Diagnosis not present

## 2014-01-30 DIAGNOSIS — Z794 Long term (current) use of insulin: Secondary | ICD-10-CM | POA: Diagnosis not present

## 2014-01-30 DIAGNOSIS — Z4789 Encounter for other orthopedic aftercare: Secondary | ICD-10-CM | POA: Diagnosis not present

## 2014-01-30 DIAGNOSIS — I10 Essential (primary) hypertension: Secondary | ICD-10-CM | POA: Diagnosis not present

## 2014-02-01 DIAGNOSIS — Z794 Long term (current) use of insulin: Secondary | ICD-10-CM | POA: Diagnosis not present

## 2014-02-01 DIAGNOSIS — I5030 Unspecified diastolic (congestive) heart failure: Secondary | ICD-10-CM | POA: Diagnosis not present

## 2014-02-01 DIAGNOSIS — S88119A Complete traumatic amputation at level between knee and ankle, unspecified lower leg, initial encounter: Secondary | ICD-10-CM | POA: Diagnosis not present

## 2014-02-01 DIAGNOSIS — E119 Type 2 diabetes mellitus without complications: Secondary | ICD-10-CM | POA: Diagnosis not present

## 2014-02-01 DIAGNOSIS — I10 Essential (primary) hypertension: Secondary | ICD-10-CM | POA: Diagnosis not present

## 2014-02-01 DIAGNOSIS — IMO0001 Reserved for inherently not codable concepts without codable children: Secondary | ICD-10-CM | POA: Diagnosis not present

## 2014-02-05 DIAGNOSIS — S88119A Complete traumatic amputation at level between knee and ankle, unspecified lower leg, initial encounter: Secondary | ICD-10-CM | POA: Diagnosis not present

## 2014-02-05 DIAGNOSIS — IMO0001 Reserved for inherently not codable concepts without codable children: Secondary | ICD-10-CM | POA: Diagnosis not present

## 2014-02-05 DIAGNOSIS — Z794 Long term (current) use of insulin: Secondary | ICD-10-CM | POA: Diagnosis not present

## 2014-02-05 DIAGNOSIS — I10 Essential (primary) hypertension: Secondary | ICD-10-CM | POA: Diagnosis not present

## 2014-02-05 DIAGNOSIS — E119 Type 2 diabetes mellitus without complications: Secondary | ICD-10-CM | POA: Diagnosis not present

## 2014-02-05 DIAGNOSIS — I5030 Unspecified diastolic (congestive) heart failure: Secondary | ICD-10-CM | POA: Diagnosis not present

## 2014-02-08 DIAGNOSIS — S88119A Complete traumatic amputation at level between knee and ankle, unspecified lower leg, initial encounter: Secondary | ICD-10-CM | POA: Diagnosis not present

## 2014-02-08 DIAGNOSIS — E119 Type 2 diabetes mellitus without complications: Secondary | ICD-10-CM | POA: Diagnosis not present

## 2014-02-08 DIAGNOSIS — IMO0001 Reserved for inherently not codable concepts without codable children: Secondary | ICD-10-CM | POA: Diagnosis not present

## 2014-02-08 DIAGNOSIS — I10 Essential (primary) hypertension: Secondary | ICD-10-CM | POA: Diagnosis not present

## 2014-02-08 DIAGNOSIS — Z794 Long term (current) use of insulin: Secondary | ICD-10-CM | POA: Diagnosis not present

## 2014-02-08 DIAGNOSIS — I5030 Unspecified diastolic (congestive) heart failure: Secondary | ICD-10-CM | POA: Diagnosis not present

## 2014-02-12 DIAGNOSIS — I5030 Unspecified diastolic (congestive) heart failure: Secondary | ICD-10-CM | POA: Diagnosis not present

## 2014-02-12 DIAGNOSIS — S88119A Complete traumatic amputation at level between knee and ankle, unspecified lower leg, initial encounter: Secondary | ICD-10-CM | POA: Diagnosis not present

## 2014-02-12 DIAGNOSIS — IMO0001 Reserved for inherently not codable concepts without codable children: Secondary | ICD-10-CM | POA: Diagnosis not present

## 2014-02-12 DIAGNOSIS — Z794 Long term (current) use of insulin: Secondary | ICD-10-CM | POA: Diagnosis not present

## 2014-02-12 DIAGNOSIS — E119 Type 2 diabetes mellitus without complications: Secondary | ICD-10-CM | POA: Diagnosis not present

## 2014-02-12 DIAGNOSIS — I10 Essential (primary) hypertension: Secondary | ICD-10-CM | POA: Diagnosis not present

## 2014-02-15 DIAGNOSIS — S88119A Complete traumatic amputation at level between knee and ankle, unspecified lower leg, initial encounter: Secondary | ICD-10-CM | POA: Diagnosis not present

## 2014-02-15 DIAGNOSIS — E119 Type 2 diabetes mellitus without complications: Secondary | ICD-10-CM | POA: Diagnosis not present

## 2014-02-15 DIAGNOSIS — Z794 Long term (current) use of insulin: Secondary | ICD-10-CM | POA: Diagnosis not present

## 2014-02-15 DIAGNOSIS — I10 Essential (primary) hypertension: Secondary | ICD-10-CM | POA: Diagnosis not present

## 2014-02-15 DIAGNOSIS — IMO0001 Reserved for inherently not codable concepts without codable children: Secondary | ICD-10-CM | POA: Diagnosis not present

## 2014-02-15 DIAGNOSIS — I5030 Unspecified diastolic (congestive) heart failure: Secondary | ICD-10-CM | POA: Diagnosis not present

## 2014-02-19 DIAGNOSIS — E119 Type 2 diabetes mellitus without complications: Secondary | ICD-10-CM | POA: Diagnosis not present

## 2014-02-19 DIAGNOSIS — I5030 Unspecified diastolic (congestive) heart failure: Secondary | ICD-10-CM | POA: Diagnosis not present

## 2014-02-19 DIAGNOSIS — I10 Essential (primary) hypertension: Secondary | ICD-10-CM | POA: Diagnosis not present

## 2014-02-19 DIAGNOSIS — IMO0001 Reserved for inherently not codable concepts without codable children: Secondary | ICD-10-CM | POA: Diagnosis not present

## 2014-02-19 DIAGNOSIS — S88119A Complete traumatic amputation at level between knee and ankle, unspecified lower leg, initial encounter: Secondary | ICD-10-CM | POA: Diagnosis not present

## 2014-02-19 DIAGNOSIS — Z794 Long term (current) use of insulin: Secondary | ICD-10-CM | POA: Diagnosis not present

## 2014-02-20 ENCOUNTER — Ambulatory Visit (HOSPITAL_COMMUNITY)
Admission: RE | Admit: 2014-02-20 | Discharge: 2014-02-20 | Disposition: A | Discharge status: Home / Self Care | Payer: Medicare Other | Source: Ambulatory Visit | Attending: Orthopedic Surgery | Admitting: Orthopedic Surgery

## 2014-02-20 ENCOUNTER — Other Ambulatory Visit (HOSPITAL_COMMUNITY): Payer: Self-pay | Admitting: Orthopedic Surgery

## 2014-02-20 DIAGNOSIS — M7989 Other specified soft tissue disorders: Secondary | ICD-10-CM | POA: Insufficient documentation

## 2014-02-20 DIAGNOSIS — M79609 Pain in unspecified limb: Secondary | ICD-10-CM | POA: Insufficient documentation

## 2014-02-20 DIAGNOSIS — M79661 Pain in right lower leg: Secondary | ICD-10-CM

## 2014-02-20 NOTE — Progress Notes (Signed)
VASCULAR LAB PRELIMINARY  PRELIMINARY  PRELIMINARY  PRELIMINARY  Right lower extremity venous duplex completed.    Preliminary report:  Right:  No evidence of DVT, superficial thrombosis, or Baker's cyst.  Latham Kinzler, RVT 02/20/2014, 2:57 PM

## 2014-02-22 DIAGNOSIS — L97929 Non-pressure chronic ulcer of unspecified part of left lower leg with unspecified severity: Secondary | ICD-10-CM | POA: Diagnosis not present

## 2014-02-22 DIAGNOSIS — L97409 Non-pressure chronic ulcer of unspecified heel and midfoot with unspecified severity: Secondary | ICD-10-CM | POA: Diagnosis not present

## 2014-02-22 DIAGNOSIS — E1149 Type 2 diabetes mellitus with other diabetic neurological complication: Secondary | ICD-10-CM | POA: Diagnosis not present

## 2014-02-22 DIAGNOSIS — I83219 Varicose veins of right lower extremity with both ulcer of unspecified site and inflammation: Secondary | ICD-10-CM | POA: Diagnosis not present

## 2014-02-22 DIAGNOSIS — L97209 Non-pressure chronic ulcer of unspecified calf with unspecified severity: Secondary | ICD-10-CM | POA: Diagnosis not present

## 2014-03-07 ENCOUNTER — Other Ambulatory Visit: Payer: Self-pay

## 2014-03-07 DIAGNOSIS — Z1231 Encounter for screening mammogram for malignant neoplasm of breast: Secondary | ICD-10-CM

## 2014-03-14 ENCOUNTER — Ambulatory Visit
Admission: RE | Admit: 2014-03-14 | Discharge: 2014-03-14 | Disposition: A | Discharge status: Home / Self Care | Payer: Medicare Other | Source: Ambulatory Visit

## 2014-03-14 DIAGNOSIS — Z1231 Encounter for screening mammogram for malignant neoplasm of breast: Secondary | ICD-10-CM

## 2014-03-15 ENCOUNTER — Other Ambulatory Visit: Payer: Self-pay | Admitting: Family Medicine

## 2014-03-15 DIAGNOSIS — R928 Other abnormal and inconclusive findings on diagnostic imaging of breast: Secondary | ICD-10-CM

## 2014-03-20 DIAGNOSIS — I83219 Varicose veins of right lower extremity with both ulcer of unspecified site and inflammation: Secondary | ICD-10-CM | POA: Diagnosis not present

## 2014-03-20 DIAGNOSIS — E1149 Type 2 diabetes mellitus with other diabetic neurological complication: Secondary | ICD-10-CM | POA: Diagnosis not present

## 2014-03-20 DIAGNOSIS — L97409 Non-pressure chronic ulcer of unspecified heel and midfoot with unspecified severity: Secondary | ICD-10-CM | POA: Diagnosis not present

## 2014-03-20 DIAGNOSIS — L97929 Non-pressure chronic ulcer of unspecified part of left lower leg with unspecified severity: Secondary | ICD-10-CM | POA: Diagnosis not present

## 2014-03-22 ENCOUNTER — Ambulatory Visit
Admission: RE | Admit: 2014-03-22 | Discharge: 2014-03-22 | Disposition: A | Discharge status: Home / Self Care | Payer: Medicare Other | Source: Ambulatory Visit | Attending: Family Medicine | Admitting: Family Medicine

## 2014-03-22 ENCOUNTER — Other Ambulatory Visit: Payer: Self-pay | Admitting: Family Medicine

## 2014-03-22 DIAGNOSIS — R928 Other abnormal and inconclusive findings on diagnostic imaging of breast: Secondary | ICD-10-CM

## 2014-03-22 DIAGNOSIS — N63 Unspecified lump in unspecified breast: Secondary | ICD-10-CM | POA: Diagnosis not present

## 2014-03-22 DIAGNOSIS — I872 Venous insufficiency (chronic) (peripheral): Secondary | ICD-10-CM | POA: Diagnosis not present

## 2014-03-22 DIAGNOSIS — L8995 Pressure ulcer of unspecified site, unstageable: Secondary | ICD-10-CM | POA: Diagnosis not present

## 2014-03-22 DIAGNOSIS — L97509 Non-pressure chronic ulcer of other part of unspecified foot with unspecified severity: Secondary | ICD-10-CM | POA: Diagnosis not present

## 2014-03-22 DIAGNOSIS — L89609 Pressure ulcer of unspecified heel, unspecified stage: Secondary | ICD-10-CM | POA: Diagnosis not present

## 2014-03-22 DIAGNOSIS — E119 Type 2 diabetes mellitus without complications: Secondary | ICD-10-CM | POA: Diagnosis not present

## 2014-03-22 DIAGNOSIS — I10 Essential (primary) hypertension: Secondary | ICD-10-CM | POA: Diagnosis not present

## 2014-03-27 DIAGNOSIS — L89609 Pressure ulcer of unspecified heel, unspecified stage: Secondary | ICD-10-CM | POA: Diagnosis not present

## 2014-03-27 DIAGNOSIS — I872 Venous insufficiency (chronic) (peripheral): Secondary | ICD-10-CM | POA: Diagnosis not present

## 2014-03-27 DIAGNOSIS — I10 Essential (primary) hypertension: Secondary | ICD-10-CM | POA: Diagnosis not present

## 2014-03-27 DIAGNOSIS — L97509 Non-pressure chronic ulcer of other part of unspecified foot with unspecified severity: Secondary | ICD-10-CM | POA: Diagnosis not present

## 2014-03-27 DIAGNOSIS — L8995 Pressure ulcer of unspecified site, unstageable: Secondary | ICD-10-CM | POA: Diagnosis not present

## 2014-03-27 DIAGNOSIS — E119 Type 2 diabetes mellitus without complications: Secondary | ICD-10-CM | POA: Diagnosis not present

## 2014-03-30 ENCOUNTER — Ambulatory Visit
Admission: RE | Admit: 2014-03-30 | Discharge: 2014-03-30 | Disposition: A | Discharge status: Home / Self Care | Payer: Medicare Other | Source: Ambulatory Visit | Attending: Family Medicine | Admitting: Family Medicine

## 2014-03-30 ENCOUNTER — Other Ambulatory Visit: Payer: Self-pay | Admitting: Family Medicine

## 2014-03-30 DIAGNOSIS — R928 Other abnormal and inconclusive findings on diagnostic imaging of breast: Secondary | ICD-10-CM

## 2014-03-30 DIAGNOSIS — N63 Unspecified lump in unspecified breast: Secondary | ICD-10-CM | POA: Diagnosis not present

## 2014-03-30 DIAGNOSIS — D249 Benign neoplasm of unspecified breast: Secondary | ICD-10-CM | POA: Diagnosis not present

## 2014-04-02 DIAGNOSIS — I872 Venous insufficiency (chronic) (peripheral): Secondary | ICD-10-CM | POA: Diagnosis not present

## 2014-04-02 DIAGNOSIS — L89609 Pressure ulcer of unspecified heel, unspecified stage: Secondary | ICD-10-CM | POA: Diagnosis not present

## 2014-04-02 DIAGNOSIS — L8995 Pressure ulcer of unspecified site, unstageable: Secondary | ICD-10-CM | POA: Diagnosis not present

## 2014-04-02 DIAGNOSIS — L97509 Non-pressure chronic ulcer of other part of unspecified foot with unspecified severity: Secondary | ICD-10-CM | POA: Diagnosis not present

## 2014-04-02 DIAGNOSIS — I10 Essential (primary) hypertension: Secondary | ICD-10-CM | POA: Diagnosis not present

## 2014-04-02 DIAGNOSIS — E119 Type 2 diabetes mellitus without complications: Secondary | ICD-10-CM | POA: Diagnosis not present

## 2014-04-03 DIAGNOSIS — E782 Mixed hyperlipidemia: Secondary | ICD-10-CM | POA: Diagnosis not present

## 2014-04-03 DIAGNOSIS — Z23 Encounter for immunization: Secondary | ICD-10-CM | POA: Diagnosis not present

## 2014-04-03 DIAGNOSIS — E118 Type 2 diabetes mellitus with unspecified complications: Secondary | ICD-10-CM | POA: Diagnosis not present

## 2014-04-03 DIAGNOSIS — I129 Hypertensive chronic kidney disease with stage 1 through stage 4 chronic kidney disease, or unspecified chronic kidney disease: Secondary | ICD-10-CM | POA: Diagnosis not present

## 2014-04-03 DIAGNOSIS — N183 Chronic kidney disease, stage 3 unspecified: Secondary | ICD-10-CM | POA: Diagnosis not present

## 2014-04-04 DIAGNOSIS — L97509 Non-pressure chronic ulcer of other part of unspecified foot with unspecified severity: Secondary | ICD-10-CM | POA: Diagnosis not present

## 2014-04-04 DIAGNOSIS — L8995 Pressure ulcer of unspecified site, unstageable: Secondary | ICD-10-CM | POA: Diagnosis not present

## 2014-04-04 DIAGNOSIS — E119 Type 2 diabetes mellitus without complications: Secondary | ICD-10-CM | POA: Diagnosis not present

## 2014-04-04 DIAGNOSIS — L89609 Pressure ulcer of unspecified heel, unspecified stage: Secondary | ICD-10-CM | POA: Diagnosis not present

## 2014-04-04 DIAGNOSIS — I10 Essential (primary) hypertension: Secondary | ICD-10-CM | POA: Diagnosis not present

## 2014-04-04 DIAGNOSIS — I872 Venous insufficiency (chronic) (peripheral): Secondary | ICD-10-CM | POA: Diagnosis not present

## 2014-04-05 DIAGNOSIS — E1149 Type 2 diabetes mellitus with other diabetic neurological complication: Secondary | ICD-10-CM | POA: Diagnosis not present

## 2014-04-05 DIAGNOSIS — L97929 Non-pressure chronic ulcer of unspecified part of left lower leg with unspecified severity: Secondary | ICD-10-CM | POA: Diagnosis not present

## 2014-04-05 DIAGNOSIS — L97209 Non-pressure chronic ulcer of unspecified calf with unspecified severity: Secondary | ICD-10-CM | POA: Diagnosis not present

## 2014-04-05 DIAGNOSIS — I83219 Varicose veins of right lower extremity with both ulcer of unspecified site and inflammation: Secondary | ICD-10-CM | POA: Diagnosis not present

## 2014-04-05 DIAGNOSIS — L97409 Non-pressure chronic ulcer of unspecified heel and midfoot with unspecified severity: Secondary | ICD-10-CM | POA: Diagnosis not present

## 2014-04-06 DIAGNOSIS — L97509 Non-pressure chronic ulcer of other part of unspecified foot with unspecified severity: Secondary | ICD-10-CM | POA: Diagnosis not present

## 2014-04-06 DIAGNOSIS — L8995 Pressure ulcer of unspecified site, unstageable: Secondary | ICD-10-CM | POA: Diagnosis not present

## 2014-04-06 DIAGNOSIS — I10 Essential (primary) hypertension: Secondary | ICD-10-CM | POA: Diagnosis not present

## 2014-04-06 DIAGNOSIS — L89609 Pressure ulcer of unspecified heel, unspecified stage: Secondary | ICD-10-CM | POA: Diagnosis not present

## 2014-04-06 DIAGNOSIS — E119 Type 2 diabetes mellitus without complications: Secondary | ICD-10-CM | POA: Diagnosis not present

## 2014-04-06 DIAGNOSIS — I872 Venous insufficiency (chronic) (peripheral): Secondary | ICD-10-CM | POA: Diagnosis not present

## 2014-04-11 DIAGNOSIS — E1149 Type 2 diabetes mellitus with other diabetic neurological complication: Secondary | ICD-10-CM | POA: Diagnosis not present

## 2014-04-11 DIAGNOSIS — I70269 Atherosclerosis of native arteries of extremities with gangrene, unspecified extremity: Secondary | ICD-10-CM | POA: Diagnosis not present

## 2014-04-11 DIAGNOSIS — L97929 Non-pressure chronic ulcer of unspecified part of left lower leg with unspecified severity: Secondary | ICD-10-CM | POA: Diagnosis not present

## 2014-04-11 DIAGNOSIS — I83219 Varicose veins of right lower extremity with both ulcer of unspecified site and inflammation: Secondary | ICD-10-CM | POA: Diagnosis not present

## 2014-04-11 DIAGNOSIS — L97209 Non-pressure chronic ulcer of unspecified calf with unspecified severity: Secondary | ICD-10-CM | POA: Diagnosis not present

## 2014-04-16 DIAGNOSIS — Z0000 Encounter for general adult medical examination without abnormal findings: Secondary | ICD-10-CM | POA: Diagnosis not present

## 2014-04-16 DIAGNOSIS — Z1331 Encounter for screening for depression: Secondary | ICD-10-CM | POA: Diagnosis not present

## 2014-04-16 DIAGNOSIS — Z139 Encounter for screening, unspecified: Secondary | ICD-10-CM | POA: Diagnosis not present

## 2014-04-27 ENCOUNTER — Encounter (HOSPITAL_COMMUNITY): Payer: Self-pay | Admitting: Pharmacy Technician

## 2014-04-27 DIAGNOSIS — E1142 Type 2 diabetes mellitus with diabetic polyneuropathy: Secondary | ICD-10-CM | POA: Diagnosis not present

## 2014-04-27 DIAGNOSIS — L89614 Pressure ulcer of right heel, stage 4: Secondary | ICD-10-CM | POA: Diagnosis not present

## 2014-04-27 DIAGNOSIS — I70261 Atherosclerosis of native arteries of extremities with gangrene, right leg: Secondary | ICD-10-CM | POA: Diagnosis not present

## 2014-04-30 ENCOUNTER — Other Ambulatory Visit (HOSPITAL_COMMUNITY): Payer: Self-pay | Admitting: Orthopedic Surgery

## 2014-05-03 ENCOUNTER — Encounter (HOSPITAL_COMMUNITY): Payer: Self-pay | Admitting: *Deleted

## 2014-05-03 MED ORDER — CEFAZOLIN SODIUM-DEXTROSE 2-3 GM-% IV SOLR
2.0000 g | INTRAVENOUS | Status: AC
  Administered 2014-05-04: 2 g via INTRAVENOUS
  Filled 2014-05-03: qty 50

## 2014-05-04 ENCOUNTER — Encounter (HOSPITAL_COMMUNITY)
Admission: RE | Disposition: A | Discharge status: Skilled Nursing Facility | Payer: Self-pay | Source: Ambulatory Visit | Attending: Orthopedic Surgery

## 2014-05-04 ENCOUNTER — Encounter (HOSPITAL_COMMUNITY): Payer: Self-pay | Admitting: Anesthesiology

## 2014-05-04 ENCOUNTER — Inpatient Hospital Stay (HOSPITAL_COMMUNITY): Payer: Medicare Other

## 2014-05-04 ENCOUNTER — Encounter (HOSPITAL_COMMUNITY): Payer: Medicare Other | Admitting: Anesthesiology

## 2014-05-04 ENCOUNTER — Inpatient Hospital Stay (HOSPITAL_COMMUNITY): Payer: Medicare Other | Admitting: Anesthesiology

## 2014-05-04 ENCOUNTER — Inpatient Hospital Stay (HOSPITAL_COMMUNITY)
Admission: RE | Admit: 2014-05-04 | Discharge: 2014-05-07 | DRG: 240 | Disposition: A | Discharge status: Skilled Nursing Facility | Payer: Medicare Other | Source: Ambulatory Visit | Attending: Orthopedic Surgery | Admitting: Orthopedic Surgery

## 2014-05-04 DIAGNOSIS — Z8249 Family history of ischemic heart disease and other diseases of the circulatory system: Secondary | ICD-10-CM

## 2014-05-04 DIAGNOSIS — E1152 Type 2 diabetes mellitus with diabetic peripheral angiopathy with gangrene: Principal | ICD-10-CM | POA: Diagnosis present

## 2014-05-04 DIAGNOSIS — Z794 Long term (current) use of insulin: Secondary | ICD-10-CM | POA: Diagnosis not present

## 2014-05-04 DIAGNOSIS — M868X7 Other osteomyelitis, ankle and foot: Secondary | ICD-10-CM | POA: Diagnosis present

## 2014-05-04 DIAGNOSIS — M6281 Muscle weakness (generalized): Secondary | ICD-10-CM | POA: Diagnosis not present

## 2014-05-04 DIAGNOSIS — Z7982 Long term (current) use of aspirin: Secondary | ICD-10-CM

## 2014-05-04 DIAGNOSIS — E662 Morbid (severe) obesity with alveolar hypoventilation: Secondary | ICD-10-CM | POA: Diagnosis not present

## 2014-05-04 DIAGNOSIS — E1159 Type 2 diabetes mellitus with other circulatory complications: Secondary | ICD-10-CM | POA: Diagnosis not present

## 2014-05-04 DIAGNOSIS — L97519 Non-pressure chronic ulcer of other part of right foot with unspecified severity: Secondary | ICD-10-CM | POA: Diagnosis present

## 2014-05-04 DIAGNOSIS — L97819 Non-pressure chronic ulcer of other part of right lower leg with unspecified severity: Secondary | ICD-10-CM | POA: Diagnosis not present

## 2014-05-04 DIAGNOSIS — Z01818 Encounter for other preprocedural examination: Secondary | ICD-10-CM | POA: Diagnosis not present

## 2014-05-04 DIAGNOSIS — G8918 Other acute postprocedural pain: Secondary | ICD-10-CM | POA: Diagnosis not present

## 2014-05-04 DIAGNOSIS — E669 Obesity, unspecified: Secondary | ICD-10-CM | POA: Diagnosis not present

## 2014-05-04 DIAGNOSIS — M6250 Muscle wasting and atrophy, not elsewhere classified, unspecified site: Secondary | ICD-10-CM | POA: Diagnosis not present

## 2014-05-04 DIAGNOSIS — R262 Difficulty in walking, not elsewhere classified: Secondary | ICD-10-CM | POA: Diagnosis not present

## 2014-05-04 DIAGNOSIS — Z89512 Acquired absence of left leg below knee: Secondary | ICD-10-CM

## 2014-05-04 DIAGNOSIS — Z8672 Personal history of thrombophlebitis: Secondary | ICD-10-CM | POA: Diagnosis not present

## 2014-05-04 DIAGNOSIS — M6258 Muscle wasting and atrophy, not elsewhere classified, other site: Secondary | ICD-10-CM | POA: Diagnosis not present

## 2014-05-04 DIAGNOSIS — E1142 Type 2 diabetes mellitus with diabetic polyneuropathy: Secondary | ICD-10-CM | POA: Diagnosis not present

## 2014-05-04 DIAGNOSIS — M79661 Pain in right lower leg: Secondary | ICD-10-CM | POA: Diagnosis not present

## 2014-05-04 DIAGNOSIS — Z6841 Body Mass Index (BMI) 40.0 and over, adult: Secondary | ICD-10-CM | POA: Diagnosis not present

## 2014-05-04 DIAGNOSIS — S88011A Complete traumatic amputation at knee level, right lower leg, initial encounter: Secondary | ICD-10-CM | POA: Diagnosis not present

## 2014-05-04 DIAGNOSIS — I509 Heart failure, unspecified: Secondary | ICD-10-CM | POA: Diagnosis not present

## 2014-05-04 DIAGNOSIS — I10 Essential (primary) hypertension: Secondary | ICD-10-CM | POA: Diagnosis present

## 2014-05-04 DIAGNOSIS — I96 Gangrene, not elsewhere classified: Secondary | ICD-10-CM | POA: Diagnosis not present

## 2014-05-04 DIAGNOSIS — Z89511 Acquired absence of right leg below knee: Secondary | ICD-10-CM | POA: Diagnosis not present

## 2014-05-04 DIAGNOSIS — I70261 Atherosclerosis of native arteries of extremities with gangrene, right leg: Secondary | ICD-10-CM | POA: Diagnosis not present

## 2014-05-04 DIAGNOSIS — G4733 Obstructive sleep apnea (adult) (pediatric): Secondary | ICD-10-CM | POA: Diagnosis present

## 2014-05-04 DIAGNOSIS — Z79899 Other long term (current) drug therapy: Secondary | ICD-10-CM | POA: Diagnosis not present

## 2014-05-04 DIAGNOSIS — L89614 Pressure ulcer of right heel, stage 4: Secondary | ICD-10-CM | POA: Diagnosis not present

## 2014-05-04 HISTORY — DX: Unspecified injury of head, initial encounter: S09.90XA

## 2014-05-04 HISTORY — DX: Other specified postprocedural states: R11.2

## 2014-05-04 HISTORY — DX: Anxiety disorder, unspecified: F41.9

## 2014-05-04 HISTORY — PX: AMPUTATION: SHX166

## 2014-05-04 HISTORY — DX: Other complications of anesthesia, initial encounter: T88.59XA

## 2014-05-04 HISTORY — DX: Personal history of other medical treatment: Z92.89

## 2014-05-04 HISTORY — DX: Adverse effect of unspecified anesthetic, initial encounter: T41.45XA

## 2014-05-04 HISTORY — DX: Other specified postprocedural states: Z98.890

## 2014-05-04 HISTORY — DX: Family history of other specified conditions: Z84.89

## 2014-05-04 LAB — GLUCOSE, CAPILLARY
GLUCOSE-CAPILLARY: 93 mg/dL (ref 70–99)
GLUCOSE-CAPILLARY: 88 mg/dL (ref 70–99)
Glucose-Capillary: 68 mg/dL — ABNORMAL LOW (ref 70–99)
Glucose-Capillary: 70 mg/dL (ref 70–99)
Glucose-Capillary: 73 mg/dL (ref 70–99)
Glucose-Capillary: 177 mg/dL — ABNORMAL HIGH (ref 70–99)

## 2014-05-04 LAB — APTT: APTT: 34 s (ref 24–37)

## 2014-05-04 LAB — PROTIME-INR
INR: 1.27 (ref 0.00–1.49)
PROTHROMBIN TIME: 16.1 s — AB (ref 11.6–15.2)

## 2014-05-04 LAB — COMPREHENSIVE METABOLIC PANEL
ALBUMIN: 2.7 g/dL — AB (ref 3.5–5.2)
ALK PHOS: 84 U/L (ref 39–117)
ALT: 19 U/L (ref 0–35)
ANION GAP: 16 — AB (ref 5–15)
AST: 28 U/L (ref 0–37)
BUN: 36 mg/dL — ABNORMAL HIGH (ref 6–23)
CHLORIDE: 101 meq/L (ref 96–112)
CO2: 22 mEq/L (ref 19–32)
CREATININE: 1.38 mg/dL — AB (ref 0.50–1.10)
Calcium: 9.3 mg/dL (ref 8.4–10.5)
GFR calc Af Amer: 44 mL/min — ABNORMAL LOW (ref >90)
GFR calc non Af Amer: 38 mL/min — ABNORMAL LOW (ref >90)
Glucose, Bld: 92 mg/dL (ref 70–99)
POTASSIUM: 4.3 meq/L (ref 3.7–5.3)
Sodium: 139 mEq/L (ref 137–147)
TOTAL PROTEIN: 7.3 g/dL (ref 6.0–8.3)
Total Bilirubin: 0.4 mg/dL (ref 0.3–1.2)

## 2014-05-04 LAB — CBC
HCT: 30.9 % — ABNORMAL LOW (ref 36.0–46.0)
Hemoglobin: 10 g/dL — ABNORMAL LOW (ref 12.0–15.0)
MCH: 28.2 pg (ref 26.0–34.0)
MCHC: 32.4 g/dL (ref 30.0–36.0)
MCV: 87.3 fL (ref 78.0–100.0)
Platelets: 211 10*3/uL (ref 150–400)
RBC: 3.54 MIL/uL — ABNORMAL LOW (ref 3.87–5.11)
RDW: 14.2 % (ref 11.5–15.5)
WBC: 14.1 10*3/uL — AB (ref 4.0–10.5)

## 2014-05-04 SURGERY — AMPUTATION BELOW KNEE
Anesthesia: Regional | Site: Leg Lower | Laterality: Right

## 2014-05-04 MED ORDER — INSULIN ASPART 100 UNIT/ML ~~LOC~~ SOLN
4.0000 [IU] | Freq: Three times a day (TID) | SUBCUTANEOUS | Status: DC
  Administered 2014-05-05 – 2014-05-07 (×3): 4 [IU] via SUBCUTANEOUS

## 2014-05-04 MED ORDER — INSULIN GLARGINE 100 UNIT/ML ~~LOC~~ SOLN
50.0000 [IU] | Freq: Every day | SUBCUTANEOUS | Status: DC
  Administered 2014-05-04 – 2014-05-06 (×3): 50 [IU] via SUBCUTANEOUS
  Filled 2014-05-04 (×4): qty 0.5

## 2014-05-04 MED ORDER — MIDAZOLAM HCL 2 MG/2ML IJ SOLN
INTRAMUSCULAR | Status: AC
  Filled 2014-05-04: qty 2

## 2014-05-04 MED ORDER — 0.9 % SODIUM CHLORIDE (POUR BTL) OPTIME
TOPICAL | Status: DC | PRN
  Administered 2014-05-04: 1000 mL

## 2014-05-04 MED ORDER — SODIUM CHLORIDE 0.9 % IV SOLN
INTRAVENOUS | Status: DC
  Administered 2014-05-04: 12:00:00 via INTRAVENOUS

## 2014-05-04 MED ORDER — SODIUM CHLORIDE 0.9 % IV SOLN
INTRAVENOUS | Status: DC | PRN
  Administered 2014-05-04: 14:00:00 via INTRAVENOUS

## 2014-05-04 MED ORDER — METOCLOPRAMIDE HCL 10 MG PO TABS: 5.0000 mg | ORAL_TABLET | Freq: Three times a day (TID) | ORAL | Status: DC | PRN

## 2014-05-04 MED ORDER — FENTANYL CITRATE 0.05 MG/ML IJ SOLN
INTRAMUSCULAR | Status: AC
  Administered 2014-05-04: 50 ug
  Filled 2014-05-04: qty 2

## 2014-05-04 MED ORDER — OXYCODONE-ACETAMINOPHEN 5-325 MG PO TABS
1.0000 | ORAL_TABLET | ORAL | Status: DC | PRN
  Administered 2014-05-05: 1 via ORAL
  Administered 2014-05-06: 2 via ORAL
  Filled 2014-05-04: qty 1
  Filled 2014-05-04: qty 2

## 2014-05-04 MED ORDER — DOCUSATE SODIUM 100 MG PO CAPS
100.0000 mg | ORAL_CAPSULE | Freq: Two times a day (BID) | ORAL | Status: DC
  Administered 2014-05-04 – 2014-05-07 (×6): 100 mg via ORAL
  Filled 2014-05-04 (×7): qty 1

## 2014-05-04 MED ORDER — INSULIN ASPART 100 UNIT/ML ~~LOC~~ SOLN
0.0000 [IU] | Freq: Three times a day (TID) | SUBCUTANEOUS | Status: DC
  Administered 2014-05-05 – 2014-05-07 (×6): 3 [IU] via SUBCUTANEOUS

## 2014-05-04 MED ORDER — DIPHENHYDRAMINE HCL 12.5 MG/5ML PO ELIX: 12.5000 mg | ORAL_SOLUTION | ORAL | Status: DC | PRN

## 2014-05-04 MED ORDER — MIDAZOLAM HCL 5 MG/5ML IJ SOLN
INTRAMUSCULAR | Status: DC | PRN
  Administered 2014-05-04: 2 mg via INTRAVENOUS

## 2014-05-04 MED ORDER — FENTANYL CITRATE 0.05 MG/ML IJ SOLN
INTRAMUSCULAR | Status: AC
  Filled 2014-05-04: qty 5

## 2014-05-04 MED ORDER — CARVEDILOL 3.125 MG PO TABS
3.1250 mg | ORAL_TABLET | Freq: Two times a day (BID) | ORAL | Status: DC
  Administered 2014-05-04 – 2014-05-07 (×6): 3.125 mg via ORAL
  Filled 2014-05-04 (×8): qty 1

## 2014-05-04 MED ORDER — FENTANYL CITRATE 0.05 MG/ML IJ SOLN
INTRAMUSCULAR | Status: DC | PRN
  Administered 2014-05-04: 100 ug via INTRAVENOUS

## 2014-05-04 MED ORDER — METHOCARBAMOL 1000 MG/10ML IJ SOLN
500.0000 mg | Freq: Four times a day (QID) | INTRAVENOUS | Status: DC | PRN
  Filled 2014-05-04: qty 5

## 2014-05-04 MED ORDER — MIDAZOLAM HCL 2 MG/2ML IJ SOLN
INTRAMUSCULAR | Status: AC
  Administered 2014-05-04: 1 mg via INTRAVENOUS
  Filled 2014-05-04: qty 2

## 2014-05-04 MED ORDER — SODIUM CHLORIDE 0.9 % IV SOLN
INTRAVENOUS | Status: DC
  Administered 2014-05-04 – 2014-05-05 (×2): via INTRAVENOUS

## 2014-05-04 MED ORDER — ONDANSETRON HCL 4 MG/2ML IJ SOLN
INTRAMUSCULAR | Status: DC | PRN
  Administered 2014-05-04: 4 mg via INTRAVENOUS

## 2014-05-04 MED ORDER — ONDANSETRON HCL 4 MG PO TABS: 4.0000 mg | ORAL_TABLET | Freq: Four times a day (QID) | ORAL | Status: DC | PRN

## 2014-05-04 MED ORDER — LIDOCAINE HCL (CARDIAC) 20 MG/ML IV SOLN
INTRAVENOUS | Status: DC | PRN
  Administered 2014-05-04: 60 mg via INTRAVENOUS

## 2014-05-04 MED ORDER — POTASSIUM CHLORIDE CRYS ER 20 MEQ PO TBCR
20.0000 meq | EXTENDED_RELEASE_TABLET | Freq: Every day | ORAL | Status: DC
Start: 2014-05-05 — End: 2014-05-07
  Administered 2014-05-05 – 2014-05-07 (×3): 20 meq via ORAL
  Filled 2014-05-04 (×3): qty 1

## 2014-05-04 MED ORDER — HYDROMORPHONE HCL 1 MG/ML IJ SOLN: 0.5000 mg | INTRAMUSCULAR | Status: DC | PRN

## 2014-05-04 MED ORDER — ALLOPURINOL 300 MG PO TABS
300.0000 mg | ORAL_TABLET | Freq: Every day | ORAL | Status: DC
Start: 2014-05-05 — End: 2014-05-07
  Administered 2014-05-05 – 2014-05-07 (×3): 300 mg via ORAL
  Filled 2014-05-04 (×3): qty 1

## 2014-05-04 MED ORDER — TORSEMIDE 20 MG PO TABS
60.0000 mg | ORAL_TABLET | Freq: Every day | ORAL | Status: DC
Start: 2014-05-04 — End: 2014-05-07
  Administered 2014-05-04 – 2014-05-07 (×4): 60 mg via ORAL
  Filled 2014-05-04 (×4): qty 3

## 2014-05-04 MED ORDER — METHOCARBAMOL 500 MG PO TABS
500.0000 mg | ORAL_TABLET | Freq: Four times a day (QID) | ORAL | Status: DC | PRN
  Administered 2014-05-05: 500 mg via ORAL
  Filled 2014-05-04 (×2): qty 1

## 2014-05-04 MED ORDER — ONDANSETRON HCL 4 MG/2ML IJ SOLN: 4.0000 mg | Freq: Four times a day (QID) | INTRAMUSCULAR | Status: DC | PRN

## 2014-05-04 MED ORDER — PROPOFOL 10 MG/ML IV BOLUS
INTRAVENOUS | Status: DC | PRN
  Administered 2014-05-04: 150 mg via INTRAVENOUS

## 2014-05-04 MED ORDER — ONDANSETRON HCL 4 MG/2ML IJ SOLN
INTRAMUSCULAR | Status: AC
  Filled 2014-05-04: qty 2

## 2014-05-04 MED ORDER — CEFAZOLIN SODIUM-DEXTROSE 2-3 GM-% IV SOLR
2.0000 g | Freq: Four times a day (QID) | INTRAVENOUS | Status: AC
  Administered 2014-05-04 – 2014-05-05 (×3): 2 g via INTRAVENOUS
  Filled 2014-05-04 (×3): qty 50

## 2014-05-04 MED ORDER — SERTRALINE HCL 50 MG PO TABS
50.0000 mg | ORAL_TABLET | Freq: Every day | ORAL | Status: DC
  Administered 2014-05-04 – 2014-05-06 (×3): 50 mg via ORAL
  Filled 2014-05-04 (×4): qty 1

## 2014-05-04 MED ORDER — PRAVASTATIN SODIUM 40 MG PO TABS
40.0000 mg | ORAL_TABLET | Freq: Every day | ORAL | Status: DC
  Administered 2014-05-04 – 2014-05-06 (×3): 40 mg via ORAL
  Filled 2014-05-04 (×4): qty 1

## 2014-05-04 MED ORDER — ASPIRIN 325 MG PO TABS
325.0000 mg | ORAL_TABLET | Freq: Every day | ORAL | Status: DC
  Administered 2014-05-05 – 2014-05-07 (×3): 325 mg via ORAL
  Filled 2014-05-04 (×3): qty 1

## 2014-05-04 MED ORDER — ROPIVACAINE HCL 5 MG/ML IJ SOLN
INTRAMUSCULAR | Status: DC | PRN
  Administered 2014-05-04: 20 mL via PERINEURAL
  Administered 2014-05-04: 30 mL via PERINEURAL

## 2014-05-04 MED ORDER — METOCLOPRAMIDE HCL 5 MG/ML IJ SOLN: 5.0000 mg | Freq: Three times a day (TID) | INTRAMUSCULAR | Status: DC | PRN

## 2014-05-04 MED ORDER — PROPOFOL 10 MG/ML IV BOLUS
INTRAVENOUS | Status: AC
  Filled 2014-05-04: qty 20

## 2014-05-04 MED ORDER — LACTATED RINGERS IV SOLN: INTRAVENOUS | Status: DC

## 2014-05-04 SURGICAL SUPPLY — 45 items
BANDAGE ESMARK 6X9 LF (GAUZE/BANDAGES/DRESSINGS) ×1 IMPLANT
BLADE SAW RECIP 87.9 MT (BLADE) ×3 IMPLANT
BLADE SURG 21 STRL SS (BLADE) ×3 IMPLANT
BNDG CMPR 9X6 STRL LF SNTH (GAUZE/BANDAGES/DRESSINGS) ×1
BNDG COHESIVE 6X5 TAN STRL LF (GAUZE/BANDAGES/DRESSINGS) ×6 IMPLANT
BNDG ESMARK 6X9 LF (GAUZE/BANDAGES/DRESSINGS) ×3
BNDG GAUZE ELAST 4 BULKY (GAUZE/BANDAGES/DRESSINGS) ×4 IMPLANT
COVER SURGICAL LIGHT HANDLE (MISCELLANEOUS) ×3 IMPLANT
CUFF TOURNIQUET SINGLE 34IN LL (TOURNIQUET CUFF) IMPLANT
CUFF TOURNIQUET SINGLE 44IN (TOURNIQUET CUFF) IMPLANT
DRAIN PENROSE 1/2X12 LTX STRL (WOUND CARE) IMPLANT
DRAPE EXTREMITY T 121X128X90 (DRAPE) ×3 IMPLANT
DRAPE PROXIMA HALF (DRAPES) ×6 IMPLANT
DRAPE U-SHAPE 47X51 STRL (DRAPES) ×2 IMPLANT
DRSG ADAPTIC 3X8 NADH LF (GAUZE/BANDAGES/DRESSINGS) ×3 IMPLANT
DRSG PAD ABDOMINAL 8X10 ST (GAUZE/BANDAGES/DRESSINGS) ×1 IMPLANT
DURAPREP 26ML APPLICATOR (WOUND CARE) ×3 IMPLANT
ELECT REM PT RETURN 9FT ADLT (ELECTROSURGICAL) ×3
ELECTRODE REM PT RTRN 9FT ADLT (ELECTROSURGICAL) ×1 IMPLANT
GAUZE SPONGE 4X4 12PLY STRL (GAUZE/BANDAGES/DRESSINGS) ×3 IMPLANT
GLOVE BIOGEL PI IND STRL 9 (GLOVE) ×1 IMPLANT
GLOVE BIOGEL PI INDICATOR 9 (GLOVE) ×2
GLOVE SURG ORTHO 9.0 STRL STRW (GLOVE) ×3 IMPLANT
GOWN STRL REUS W/ TWL XL LVL3 (GOWN DISPOSABLE) ×2 IMPLANT
GOWN STRL REUS W/TWL XL LVL3 (GOWN DISPOSABLE) ×3
KIT BASIN OR (CUSTOM PROCEDURE TRAY) ×3 IMPLANT
KIT ROOM TURNOVER OR (KITS) ×3 IMPLANT
MANIFOLD NEPTUNE II (INSTRUMENTS) ×3 IMPLANT
NS IRRIG 1000ML POUR BTL (IV SOLUTION) ×3 IMPLANT
PACK GENERAL/GYN (CUSTOM PROCEDURE TRAY) ×3 IMPLANT
PAD ABD 8X10 STRL (GAUZE/BANDAGES/DRESSINGS) ×6 IMPLANT
PAD ARMBOARD 7.5X6 YLW CONV (MISCELLANEOUS) ×6 IMPLANT
SPONGE LAP 18X18 X RAY DECT (DISPOSABLE) ×2 IMPLANT
STAPLER VISISTAT 35W (STAPLE) ×2 IMPLANT
STOCKINETTE IMPERVIOUS LG (DRAPES) ×3 IMPLANT
SUT PDS AB 1 CT  36 (SUTURE)
SUT PDS AB 1 CT 36 (SUTURE) IMPLANT
SUT SILK 2 0 (SUTURE) ×3
SUT SILK 2-0 18XBRD TIE 12 (SUTURE) ×1 IMPLANT
SUT VIC AB 1 CTX 36 (SUTURE) ×6
SUT VIC AB 1 CTX36XBRD ANBCTR (SUTURE) IMPLANT
TOWEL OR 17X24 6PK STRL BLUE (TOWEL DISPOSABLE) ×3 IMPLANT
TOWEL OR 17X26 10 PK STRL BLUE (TOWEL DISPOSABLE) ×3 IMPLANT
TUBE ANAEROBIC SPECIMEN COL (MISCELLANEOUS) IMPLANT
WATER STERILE IRR 1000ML POUR (IV SOLUTION) ×1 IMPLANT

## 2014-05-04 NOTE — H&P (Signed)
[name] is an 68 y.o. female.   Chief Complaint: Osteomyelitis ulceration right foot HPI: Patient is a 68 year old woman with diabetes peripheral vascular disease who is status post foot salvage surgery intervention who presents at this time after failure foot salvage for a transtibial amputation.  Past Medical History  Diagnosis Date  . OSA (obstructive sleep apnea)   . Thrombophlebitis of deep vein of leg   . DM (diabetes mellitus)   . CHF (congestive heart failure)     hx of EF 15-20% in 4/08,  echo 3/09 EF 60-65%;   echo 6/12: EF 55-60%, mild LAE  . Carotid stenosis     u/s 6/10: R 0-39%Ll 40-59%;  u/s 7/12: 40-59% bilateral (repeat in 7/13)  . HTN (hypertension)   . Morbid obesity   . Pulmonary HTN     mild,  cath 6/08 with PVR 5.1  . Hypokalemia   . 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 Spinal stenosis   . Obesity hypoventilation syndrome   . Cancer     hx of colon CA 2000  . Arthritis   . Complication of anesthesia     Oxygen drops   . PONV (postoperative nausea and vomiting)     after colon surgery 2000  . Head injury, acute, without loss of consciousness   . Anxiety   . History of blood transfusion   . Family history of anesthesia complication     Mother N/V    Past Surgical History  Procedure Laterality Date  . Carpal tunnel release Left 07/2007  . Colon cancer with resection  2000  . Cholecystectomy  2002  . Vaginal hysterectomy  1983  . Eye surgery Bilateral     bilateral cataracts   . Amputation Left 10/07/2012    Procedure: AMPUTATION RAY;  Surgeon: Newt Minion, MD;  Location: [place];  Service: Orthopedics;  Laterality: Left;  Left foot 1st Ray Amputation  . Amputation Left 06/18/2013    Procedure: AMPUTATION BELOW KNEE;  Surgeon: Newt Minion, MD;  Location: [place];  Service: Orthopedics;  Laterality: Left;  . Colonoscopy      Family History  Problem Relation Age of Onset  . Coronary artery disease Father    Social  History:  reports that she has never smoked. She has never used smokeless tobacco. She reports that she does not drink alcohol or use illicit drugs.  Allergies:  Allergies  Allergen Reactions  . Aldactone [Spironolactone] Other (See Comments)    ARF  . Codeine Other (See Comments)    Causes confusion  . Meperidine Hcl Other (See Comments)    Increase in heart rate (demerol)  . Metformin Nausea And Vomiting    No prescriptions prior to admission    No results found for this or any previous visit (from the past 48 hour(s)). No results found.  Review of Systems  All other systems reviewed and are negative.   There were no vitals taken for this visit. Physical Exam  On examination patient has osteomyelitis ulceration of the right foot. Assessment/Plan Assessment: Ulceration osteomyelitis right foot.  Plan: We'll plan for transtibial amputation the right. Risks and benefits were discussed including risk of wound healing potential for additional surgery. Patient states she understands and wished to proceed at this time.  DUDA,[name] 05/04/2014, 6:36 AM

## 2014-05-04 NOTE — Anesthesia Postprocedure Evaluation (Signed)
  Anesthesia Post-op Note  Patient: [name]  Procedure(s) Performed: Procedure(s): Right Below Knee Amputation (Right)  Patient Location: PACU  Anesthesia Type:General  Level of Consciousness: awake  Airway and Oxygen Therapy: Patient Spontanous Breathing  Post-op Pain: mild  Post-op Assessment: Post-op Vital signs reviewed  Post-op Vital Signs: Reviewed  Last Vitals:  Filed Vitals:   05/04/14 1600  BP: 134/43  Pulse: 70  Temp:   Resp: 21    Complications: No apparent anesthesia complications

## 2014-05-04 NOTE — Progress Notes (Signed)
Utilization review completed.  

## 2014-05-04 NOTE — Anesthesia Preprocedure Evaluation (Signed)
Anesthesia Evaluation  Patient identified by MRN, date of birth, ID band Patient awake    Reviewed: Allergy & Precautions, H&P , NPO status , Patient's Chart, lab work & pertinent test results  Airway Mallampati: I TM Distance: >3 FB Neck ROM: Full    Dental  (+) Teeth Intact   Pulmonary sleep apnea , neg COPDneg recent URI,  Pulmonary hypertension per chart patient unaware breath sounds clear to auscultation        Cardiovascular hypertension, Pt. on medications and Pt. on home beta blockers - angina+ Peripheral Vascular Disease and +CHF - Past MI Rhythm:Regular     Neuro/Psych PSYCHIATRIC DISORDERS negative neurological ROS     GI/Hepatic negative GI ROS, Neg liver ROS,   Endo/Other  diabetes, Type 2, Insulin DependentMorbid obesity  Renal/GU Renal disease     Musculoskeletal   Abdominal   Peds  Hematology   Anesthesia Other Findings   Reproductive/Obstetrics                           Anesthesia Physical Anesthesia Plan  ASA: III  Anesthesia Plan: General and Regional   Post-op Pain Management:    Induction: Intravenous  Airway Management Planned: LMA  Additional Equipment: None  Intra-op Plan:   Post-operative Plan: Extubation in OR  Informed Consent: I have reviewed the patients History and Physical, chart, labs and discussed the procedure including the risks, benefits and alternatives for the proposed anesthesia with the patient or authorized representative who has indicated his/her understanding and acceptance.   Dental advisory given  Plan Discussed with: CRNA and Surgeon  Anesthesia Plan Comments:         Anesthesia Quick Evaluation

## 2014-05-04 NOTE — Op Note (Signed)
05/04/2014  3:19 PM  PATIENT:  [name]    PRE-OPERATIVE DIAGNOSIS:  Gangrene Right Heel  POST-OPERATIVE DIAGNOSIS:  Same  PROCEDURE:  Right Below Knee Amputation  SURGEON:  Newt Minion, MD  PHYSICIAN ASSISTANT:None ANESTHESIA:   General  PREOPERATIVE INDICATIONS:  [name] is a  68 y.o. female with a diagnosis of Gangrene Right Heel who failed conservative measures and elected for surgical management.    The risks benefits and alternatives were discussed with the patient preoperatively including but not limited to the risks of infection, bleeding, nerve injury, cardiopulmonary complications, the need for revision surgery, among others, and the patient was willing to proceed.  OPERATIVE IMPLANTS: none  OPERATIVE FINDINGS: Calcified vessels  OPERATIVE PROCEDURE: Patient was brought to the operating room and underwent a general anesthetic. After adequate levels of anesthesia were obtained patient's right extremity was prepped using DuraPrep draped into a sterile field. A timeout was called. A transverse incision was made 11 cm distal to the tibial tubercle this curved proximally a large posterior flap was created. The tibia was transected 1 cm proximal to the skin incision beveled anteriorly. The fibula was transected just proximal to the tibial incision. A large posterior flap was created. The sciatic nerve was pulled cut and allowed to retract. The vascular bundles were suture ligated with 2-0 silk. The tourniquet was deflated. The deep and superficial fascial layers were closed using #1 Vicryl. The skin was closed using staples. A sterile compressive dressing was applied. Patient was extubated taken to the PACU in stable condition. The foot was draped out of sterile field with an impervious stocking at. Plan for discharge to skilled nursing.

## 2014-05-04 NOTE — Transfer of Care (Signed)
Immediate Anesthesia Transfer of Care Note  Patient: [name]  Procedure(s) Performed: Procedure(s): Right Below Knee Amputation (Right)  Patient Location: PACU  Anesthesia Type:General  Level of Consciousness: awake and alert   Airway & Oxygen Therapy: Patient Spontanous Breathing and Patient connected to face mask oxygen  Post-op Assessment: Report given to PACU RN and Post -op Vital signs reviewed and stable  Post vital signs: Reviewed and stable  Complications: No apparent anesthesia complications

## 2014-05-04 NOTE — Anesthesia Procedure Notes (Addendum)
Anesthesia Regional Block:  Popliteal block  Pre-Anesthetic Checklist: ,, timeout performed, Correct Patient, Correct Site, Correct Laterality, Correct Procedure, Correct Position, site marked, Risks and benefits discussed,  Surgical consent,  Pre-op evaluation,  At surgeon's request and post-op pain management  Laterality: Lower and Right  Prep: chloraprep       Needles:  Injection technique: Single-shot  Needle Type: Echogenic Stimulator Needle          Additional Needles:  Procedures: nerve stimulator Popliteal block  Nerve Stimulator or Paresthesia:  Response: plantarflexion , 0.4 mA,   Additional Responses:   Narrative:  Start time: 05/04/2014 1:18 PM End time: 05/04/2014 1:24 PM Injection made incrementally with aspirations every 5 mL.  Performed by: Personally  Anesthesiologist: Ahmiya Abee  Additional Notes: H+P and labs reviewed, risks and benefits discussed with patient, procedure tolerated well without complications   Anesthesia Regional Block:  Femoral nerve block  Pre-Anesthetic Checklist: ,, timeout performed, Correct Patient, Correct Site, Correct Laterality, Correct Procedure, Correct Position, site marked, Risks and benefits discussed,  Surgical consent,  Pre-op evaluation,  At surgeon's request and post-op pain management  Laterality: Lower  Prep: chloraprep       Needles:  Injection technique: Single-shot  Needle Type: Echogenic Stimulator Needle          Additional Needles:  Procedures: ultrasound guided (picture in chart) and nerve stimulator Femoral nerve block  Nerve Stimulator or Paresthesia:  Response: quad, 0.4 mA,   Additional Responses:   Narrative:  Start time: 05/04/2014 1:36 PM End time: 05/04/2014 1:42 PM Injection made incrementally with aspirations every 5 mL.  Performed by: Personally  Anesthesiologist: Keniah Klemmer  Additional Notes: H+P and labs reviewed, risks and benefits discussed with patient, procedure tolerated  well without complications

## 2014-05-04 NOTE — Progress Notes (Signed)
Plan for discharge to skilled nursing. Patient's family has Re: chosen and a facility.

## 2014-05-05 LAB — GLUCOSE, CAPILLARY
GLUCOSE-CAPILLARY: 139 mg/dL — AB (ref 70–99)
GLUCOSE-CAPILLARY: 193 mg/dL — AB (ref 70–99)
GLUCOSE-CAPILLARY: 170 mg/dL — AB (ref 70–99)
Glucose-Capillary: 166 mg/dL — ABNORMAL HIGH (ref 70–99)
Glucose-Capillary: 133 mg/dL — ABNORMAL HIGH (ref 70–99)

## 2014-05-05 NOTE — Evaluation (Signed)
Physical Therapy Evaluation Patient Details Name: [name] MRN: [id number] DOB: 08-26-1945 Today's Date: 05/05/2014   History of Present Illness  Patient is a 68 year old woman with diabetes peripheral vascular disease who is status post foot salvage surgery intervention who presents at this time after failure foot salvage for a transtibial amputation of RLE. 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 Pt has LLE prosthesis and plans to progress to RLE prosthesis. Pt currently with generalized weakness, posterior lean and 2 person assist needed for anterior/posterior transfer who will benefit from acute therapy to maximize function, mobility, strength and independence. Pt incontinent of urine on arrival and unaware with tech assisting for pericare prior to transfer to chair. Recommend nursing use lift for OOB.    Follow Up Recommendations SNF;Supervision/Assistance - 24 hour    Equipment Recommendations  None recommended by PT    Recommendations for Other Services       Precautions / Restrictions Precautions Precautions: Fall Precaution Comments: bil BKA Restrictions RLE Weight Bearing: Non weight bearing      Mobility  Bed Mobility Overal bed mobility: Needs Assistance;+2 for physical assistance Bed Mobility: Rolling Rolling: Mod assist         General bed mobility comments: max cues for sequence and safety with rail, 2 person assist to scoot to [place] Overall transfer level: Needs assistance Equipment used: 2 person hand held assist Transfers: Comptroller transfers: Mod assist   General transfer comment: max cues with reciprocal scooting, pad and safety for sequence to scoot back in chair  Ambulation/Gait                Stairs            Wheelchair Mobility    Modified Rankin (Stroke Patients Only)        Balance Overall balance assessment: Needs assistance   Sitting balance-Leahy Scale: Fair   Postural control: Posterior lean                                   Pertinent Vitals/Pain Pain Assessment: No/denies pain    Home Living Family/patient expects to be discharged to:: Skilled nursing facility Living Arrangements: Spouse/significant other Available Help at Discharge: Family;Available PRN/intermittently Type of Home: House Home Access: Level entry     Home Layout: Two level;Laundry or work area in [place]: Environmental consultant - 2 wheels;Walker - 4 wheels;Cane - single point;Bedside commode;Shower seat;Wheelchair - manual;Grab bars - tub/shower;Hand held shower head      Prior Function Level of Independence: Independent with assistive device(s)         Comments: was walking with RW and prosthesis until wound on RLE until one month ago     Hand Dominance        Extremity/Trunk Assessment   Upper Extremity Assessment: Generalized weakness           Lower Extremity Assessment: RLE deficits/detail;LLE deficits/detail RLE Deficits / Details: limited AROM pt reports numbness  LLE Deficits / Details: grossly WFL ROM strength not formally assessed  Cervical / Trunk Assessment: Normal  Communication   Communication: No difficulties  Cognition Arousal/Alertness: Awake/alert Behavior During Therapy: WFL for tasks assessed/performed Overall Cognitive Status: Within Functional Limits for tasks assessed  General Comments      Exercises        Assessment/Plan    PT Assessment Patient needs continued PT services  PT Diagnosis Generalized weakness   PT Problem List Decreased strength;Decreased activity tolerance;Decreased balance;Decreased mobility  PT Treatment Interventions Functional mobility training;Therapeutic activities;Therapeutic exercise;Patient/family education;DME instruction;Balance training   PT  Goals (Current goals can be found in the Care Plan section) Acute Rehab PT Goals Patient Stated Goal: be able to walk with prostheses PT Goal Formulation: With patient Time For Goal Achievement: 05/19/14 Potential to Achieve Goals: Fair    Frequency Min 3X/week   Barriers to discharge Decreased caregiver support      Co-evaluation               End of Session   Activity Tolerance: Patient tolerated treatment well Patient left: in chair;with call bell/phone within reach;with nursing/sitter in room Nurse Communication: Precautions;Need for lift equipment;Mobility status         Time: 1142-1208 PT Time Calculation (min): 26 min   Charges:   PT Evaluation $Initial PT Evaluation Tier I: 1 Procedure PT Treatments $Therapeutic Activity: 8-22 mins   PT G Codes:          Melford Aase 05/05/2014, 12:16 PM Elwyn Reach, [place]

## 2014-05-05 NOTE — Progress Notes (Signed)
Dressing c/d/i AFVSS Pain controlled Up with PT Anticipate dc SNF Monday  N. Eduard Roux, MD [place] 9:21 AM

## 2014-05-06 LAB — GLUCOSE, CAPILLARY
GLUCOSE-CAPILLARY: 145 mg/dL — AB (ref 70–99)
Glucose-Capillary: 114 mg/dL — ABNORMAL HIGH (ref 70–99)
Glucose-Capillary: 167 mg/dL — ABNORMAL HIGH (ref 70–99)
Glucose-Capillary: 159 mg/dL — ABNORMAL HIGH (ref 70–99)

## 2014-05-06 NOTE — Clinical Social Work Placement (Addendum)
Clinical Social Work Department CLINICAL SOCIAL WORK PLACEMENT NOTE 05/06/2014  Patient:  [name], [name]  Account Number:  [id number] Admit date:  05/04/2014  Clinical Social Worker:  Daiva Huge  Date/time:  05/06/2014 11:50 AM  Clinical Social Work is seeking post-discharge placement for this patient at the following level of care:   [place]   (*CSW will update this form in [place] as items are completed)   05/06/2014  Patient/family provided with [place] Department of Clinical Social Work's list of facilities offering this level of care within the geographic area requested by the patient (or if unable, by the patient's family).  05/06/2014  Patient/family informed of their freedom to choose among providers that offer the needed level of care, that participate in Medicare, Medicaid or managed care program needed by the patient, have an available bed and are willing to accept the patient.  05/06/2014  Patient/family informed of [facility]' ownership interest in [facility], as well as of the fact that they are under no obligation to receive care at this facility.  PASARR submitted to EDS on 05/06/2014 PASARR number received on 05/06/2014  FL2 transmitted to all facilities in geographic area requested by pt/family on  05/06/2014 FL2 transmitted to all facilities within larger geographic area on   Patient informed that his/her managed care company has contracts with or will negotiate with  certain facilities, including the following:     Patient/family informed of bed offers received:  05/07/2014 Lubertha Sayres, Latanya Presser) Patient chooses bed at [facility] Lubertha Sayres, [place]) Physician recommends and patient chooses bed at    Patient to be transferred to  [place] on  05/07/2014 Lubertha Sayres, [facility]) Patient to be transferred to facility by [facility] Lubertha Sayres, [place]) Patient and family notified of transfer on  05/07/2014 Lubertha Sayres, Latanya Presser) Name of family member notified:  Patient updated regarding discharge. [place])  The following physician request were entered in Epic:   Additional Comments: Eduard Clos, MSW, [place] coverage  Lubertha Sayres, [place] ([phone]) Licensed Clinical Social Worker Orthopedics [phone]) and Surgical [phone])

## 2014-05-06 NOTE — Clinical Social Work Psychosocial (Signed)
Clinical Social Work Department BRIEF PSYCHOSOCIAL ASSESSMENT 05/06/2014  Patient:  LEAFY, MOTSINGER     Account Number:  [id number]     Admit date:  05/04/2014  Clinical Social Worker:  Daiva Huge  Date/Time:  05/06/2014 11:44 AM  Referred by:  Physician  Date Referred:  05/06/2014 Referred for  SNF Placement   Other Referral:   Interview type:  Patient Other interview type:    PSYCHOSOCIAL DATA Living Status:  FAMILY Admitted from facility:   Level of care:   Primary support name:  husband and daughter- Lattie Haw Primary support relationship to patient:  FAMILY Degree of support available:   good    CURRENT CONCERNS Current Concerns  Post-Acute Placement   Other Concerns:    SOCIAL WORK ASSESSMENT / PLAN Met wtih patient to discuss SNF for rehab- patient reports she was pretty active prior to this admission and surgery- now bilateral amputee. SHe was able to get up and down stairs and has a prosthesis for her old amputation. She is hopeful for SNF rehab to help her post operatively and prior to returning home where she lives with her husband of 65 years.   Assessment/plan status:  Other - See comment Other assessment/ plan:   FL2 and PASARR for SNF   Information/referral to community resources:   SNF list    PATIENT'S/FAMILY'S RESPONSE TO PLAN OF CARE: Patient reports she has already spoken with SNF staff at [facility] [place] which is near her home and her preference. CSW will seek offer from them as well as other surrounding SNF's for back up option.  Patient in good spirits- appears to coping and managinf well post op with bilateral LE amputations.       Eduard Clos, MSW, [place] weekend coverage

## 2014-05-06 NOTE — Progress Notes (Signed)
Comfortable Dressing c/d/i SNF Monday Continue to work with PT  N. Eduard Roux, MD [place] 7:55 AM

## 2014-05-07 DIAGNOSIS — E1159 Type 2 diabetes mellitus with other circulatory complications: Secondary | ICD-10-CM | POA: Diagnosis not present

## 2014-05-07 DIAGNOSIS — Z79899 Other long term (current) drug therapy: Secondary | ICD-10-CM | POA: Diagnosis not present

## 2014-05-07 DIAGNOSIS — Z89512 Acquired absence of left leg below knee: Secondary | ICD-10-CM | POA: Diagnosis not present

## 2014-05-07 DIAGNOSIS — Z89519 Acquired absence of unspecified leg below knee: Secondary | ICD-10-CM | POA: Diagnosis not present

## 2014-05-07 DIAGNOSIS — Z89511 Acquired absence of right leg below knee: Secondary | ICD-10-CM | POA: Diagnosis not present

## 2014-05-07 DIAGNOSIS — E1151 Type 2 diabetes mellitus with diabetic peripheral angiopathy without gangrene: Secondary | ICD-10-CM | POA: Diagnosis not present

## 2014-05-07 DIAGNOSIS — Z9049 Acquired absence of other specified parts of digestive tract: Secondary | ICD-10-CM | POA: Diagnosis not present

## 2014-05-07 DIAGNOSIS — M6250 Muscle wasting and atrophy, not elsewhere classified, unspecified site: Secondary | ICD-10-CM | POA: Diagnosis not present

## 2014-05-07 DIAGNOSIS — E662 Morbid (severe) obesity with alveolar hypoventilation: Secondary | ICD-10-CM | POA: Diagnosis not present

## 2014-05-07 DIAGNOSIS — N3942 Incontinence without sensory awareness: Secondary | ICD-10-CM | POA: Diagnosis not present

## 2014-05-07 DIAGNOSIS — T879 Unspecified complications of amputation stump: Secondary | ICD-10-CM | POA: Diagnosis not present

## 2014-05-07 DIAGNOSIS — R262 Difficulty in walking, not elsewhere classified: Secondary | ICD-10-CM | POA: Diagnosis not present

## 2014-05-07 DIAGNOSIS — Z85038 Personal history of other malignant neoplasm of large intestine: Secondary | ICD-10-CM | POA: Diagnosis not present

## 2014-05-07 DIAGNOSIS — D649 Anemia, unspecified: Secondary | ICD-10-CM | POA: Diagnosis not present

## 2014-05-07 DIAGNOSIS — E669 Obesity, unspecified: Secondary | ICD-10-CM | POA: Diagnosis not present

## 2014-05-07 DIAGNOSIS — R339 Retention of urine, unspecified: Secondary | ICD-10-CM | POA: Diagnosis not present

## 2014-05-07 DIAGNOSIS — S88911A Complete traumatic amputation of right lower leg, level unspecified, initial encounter: Secondary | ICD-10-CM | POA: Diagnosis not present

## 2014-05-07 DIAGNOSIS — E1142 Type 2 diabetes mellitus with diabetic polyneuropathy: Secondary | ICD-10-CM | POA: Diagnosis not present

## 2014-05-07 DIAGNOSIS — I10 Essential (primary) hypertension: Secondary | ICD-10-CM | POA: Diagnosis not present

## 2014-05-07 DIAGNOSIS — I6529 Occlusion and stenosis of unspecified carotid artery: Secondary | ICD-10-CM | POA: Diagnosis not present

## 2014-05-07 DIAGNOSIS — R2689 Other abnormalities of gait and mobility: Secondary | ICD-10-CM | POA: Diagnosis not present

## 2014-05-07 DIAGNOSIS — I509 Heart failure, unspecified: Secondary | ICD-10-CM | POA: Diagnosis not present

## 2014-05-07 DIAGNOSIS — E1165 Type 2 diabetes mellitus with hyperglycemia: Secondary | ICD-10-CM | POA: Diagnosis not present

## 2014-05-07 DIAGNOSIS — R278 Other lack of coordination: Secondary | ICD-10-CM | POA: Diagnosis not present

## 2014-05-07 DIAGNOSIS — I70263 Atherosclerosis of native arteries of extremities with gangrene, bilateral legs: Secondary | ICD-10-CM | POA: Diagnosis not present

## 2014-05-07 DIAGNOSIS — I739 Peripheral vascular disease, unspecified: Secondary | ICD-10-CM | POA: Diagnosis not present

## 2014-05-07 DIAGNOSIS — Z452 Encounter for adjustment and management of vascular access device: Secondary | ICD-10-CM | POA: Diagnosis not present

## 2014-05-07 DIAGNOSIS — M79662 Pain in left lower leg: Secondary | ICD-10-CM | POA: Diagnosis not present

## 2014-05-07 DIAGNOSIS — F4320 Adjustment disorder, unspecified: Secondary | ICD-10-CM | POA: Diagnosis not present

## 2014-05-07 DIAGNOSIS — L97411 Non-pressure chronic ulcer of right heel and midfoot limited to breakdown of skin: Secondary | ICD-10-CM | POA: Diagnosis not present

## 2014-05-07 DIAGNOSIS — M79661 Pain in right lower leg: Secondary | ICD-10-CM | POA: Diagnosis not present

## 2014-05-07 DIAGNOSIS — M6258 Muscle wasting and atrophy, not elsewhere classified, other site: Secondary | ICD-10-CM | POA: Diagnosis not present

## 2014-05-07 DIAGNOSIS — Z1389 Encounter for screening for other disorder: Secondary | ICD-10-CM | POA: Diagnosis not present

## 2014-05-07 DIAGNOSIS — I129 Hypertensive chronic kidney disease with stage 1 through stage 4 chronic kidney disease, or unspecified chronic kidney disease: Secondary | ICD-10-CM | POA: Diagnosis not present

## 2014-05-07 DIAGNOSIS — I70261 Atherosclerosis of native arteries of extremities with gangrene, right leg: Secondary | ICD-10-CM | POA: Diagnosis not present

## 2014-05-07 DIAGNOSIS — Z882 Allergy status to sulfonamides status: Secondary | ICD-10-CM | POA: Diagnosis not present

## 2014-05-07 DIAGNOSIS — F329 Major depressive disorder, single episode, unspecified: Secondary | ICD-10-CM | POA: Diagnosis not present

## 2014-05-07 DIAGNOSIS — S88011A Complete traumatic amputation at knee level, right lower leg, initial encounter: Secondary | ICD-10-CM | POA: Diagnosis not present

## 2014-05-07 DIAGNOSIS — Z0000 Encounter for general adult medical examination without abnormal findings: Secondary | ICD-10-CM | POA: Diagnosis not present

## 2014-05-07 DIAGNOSIS — M109 Gout, unspecified: Secondary | ICD-10-CM | POA: Diagnosis not present

## 2014-05-07 DIAGNOSIS — S88912A Complete traumatic amputation of left lower leg, level unspecified, initial encounter: Secondary | ICD-10-CM | POA: Diagnosis not present

## 2014-05-07 DIAGNOSIS — E782 Mixed hyperlipidemia: Secondary | ICD-10-CM | POA: Diagnosis not present

## 2014-05-07 DIAGNOSIS — M6281 Muscle weakness (generalized): Secondary | ICD-10-CM | POA: Diagnosis not present

## 2014-05-07 DIAGNOSIS — Z6838 Body mass index (BMI) 38.0-38.9, adult: Secondary | ICD-10-CM | POA: Diagnosis not present

## 2014-05-07 DIAGNOSIS — N390 Urinary tract infection, site not specified: Secondary | ICD-10-CM | POA: Diagnosis not present

## 2014-05-07 DIAGNOSIS — R2681 Unsteadiness on feet: Secondary | ICD-10-CM | POA: Diagnosis not present

## 2014-05-07 DIAGNOSIS — N3020 Other chronic cystitis without hematuria: Secondary | ICD-10-CM | POA: Diagnosis not present

## 2014-05-07 DIAGNOSIS — R05 Cough: Secondary | ICD-10-CM | POA: Diagnosis not present

## 2014-05-07 LAB — GLUCOSE, CAPILLARY
GLUCOSE-CAPILLARY: 114 mg/dL — AB (ref 70–99)
Glucose-Capillary: 151 mg/dL — ABNORMAL HIGH (ref 70–99)

## 2014-05-07 MED ORDER — OXYCODONE-ACETAMINOPHEN 5-325 MG PO TABS: 1.0000 | ORAL_TABLET | ORAL | Status: DC | PRN

## 2014-05-07 NOTE — Discharge Instructions (Signed)
Change dressing as needed

## 2014-05-07 NOTE — Clinical Social Work Note (Signed)
Pt to be discharged to [facility] SNF. Pt updated regarding discharge.  [facility]: [phone] Transportation: EMS ([address])  Lubertha Sayres, [place] ([phone]) Licensed Clinical Social Worker Orthopedics [phone]) and Surgical [phone])

## 2014-05-07 NOTE — Discharge Summary (Signed)
Physician Discharge Summary  Patient ID: [name] MRN: [id number] DOB/AGE: 1946/06/18 68 y.o.  Admit date: 05/04/2014 Discharge date: 05/07/2014  Admission Diagnoses: Dehiscence right transtibial amputation  Discharge Diagnoses:  Same Active Problems:   S/P BKA (below knee amputation) bilateral   Discharged Condition: stable  Hospital Course: Patient's hospital course was essentially unremarkable. She underwent revision of her right transtibial amputation. Postoperatively patient was stable and was discharged back to skilled nursing.  Consults: None  Significant Diagnostic Studies: labs: Routine labs  Treatments: surgery: See operative note  Discharge Exam: Blood pressure 146/58, pulse 75, temperature 98.7 F (37.1 C), temperature source Oral, resp. rate 18, height 5\' 3"  (1.6 m), weight 105.688 kg (233 lb), SpO2 96.00%. Incision/Wound: dressing clean dry and intact  Disposition: 03-Skilled Nursing Facility  Discharge Instructions   Call MD / Call 911    Complete by:  As directed   If you experience chest pain or shortness of breath, CALL 911 and be transported to the hospital emergency room.  If you develope a fever above 101 F, pus (white drainage) or increased drainage or redness at the wound, or calf pain, call your surgeon's office.     Constipation Prevention    Complete by:  As directed   Drink plenty of fluids.  Prune juice may be helpful.  You may use a stool softener, such as Colace (over the counter) 100 mg twice a day.  Use MiraLax (over the counter) for constipation as needed.     Diet - low sodium heart healthy    Complete by:  As directed      Increase activity slowly as tolerated    Complete by:  As directed             Medication List         acetaminophen 500 MG tablet  Commonly known as:  TYLENOL  Take 500 mg by mouth every 6 (six) hours as needed.     allopurinol 300 MG tablet  Commonly known as:  ZYLOPRIM  Take 300 mg by mouth daily.      APIDRA 100 UNIT/ML injection  Generic drug:  insulin glulisine  Inject 5-20 Units into the skin 3 (three) times daily before meals. Sliding scale     BAYER ASPIRIN 325 MG tablet  Generic drug:  aspirin  Take 325 mg by mouth daily.     carvedilol 3.125 MG tablet  Commonly known as:  COREG  Take 3.125 mg by mouth 2 (two) times daily with a meal.     insulin glargine 100 UNIT/ML injection  Commonly known as:  LANTUS  Inject 50 units in AM and 50 units in PM     oxyCODONE-acetaminophen 5-325 MG per tablet  Commonly known as:  ROXICET  Take 1 tablet by mouth every 4 (four) hours as needed for severe pain.     potassium chloride SA 20 MEQ tablet  Commonly known as:  K-DUR,KLOR-CON  Take 20 mEq by mouth daily.     pravastatin 40 MG tablet  Commonly known as:  PRAVACHOL  Take 1 tablet by mouth daily.     sertraline 100 MG tablet  Commonly known as:  ZOLOFT  Take 50 mg by mouth daily.     torsemide 20 MG tablet  Commonly known as:  DEMADEX  Take 60 mg by mouth daily.     Vitamin D (Ergocalciferol) 50000 UNITS Caps capsule  Commonly known as:  DRISDOL  Take 50,000 Units by mouth 2 (  two) times a week. Take on Mon and Thurs.           Follow-up Information   Follow up with DUDA,[name], MD In 2 weeks.   Specialty:  Orthopedic Surgery   Contact information:   [place] [address] [phone]       Signed: Newt Minion 05/07/2014, 6:34 AM

## 2014-05-07 NOTE — Progress Notes (Signed)
OT Cancellation Note  Patient Details Name: [name] MRN: [id number] DOB: [birth year]   Cancelled Treatment:    Reason Eval/Treat Not Completed: Other (comment) Pt is Medicare and current D/C plan is SNF. No apparent immediate acute care OT needs, therefore will defer OT to SNF. If OT eval is needed please call Acute Rehab Dept. at [phone] or text page OT at [phone].    Almon Register [phone] 05/07/2014, 7:30 AM

## 2014-05-07 NOTE — Progress Notes (Signed)
Patient discharged to [facility], SNF. Report called to RN at facility. IV was removed and patient left unit in a stable condition with all personal belongings via EMS.

## 2014-05-07 NOTE — Progress Notes (Signed)
Physical Therapy Treatment Patient Details Name: [name] MRN: [id number] DOB: February 18, 1946 Today's Date: 05/07/2014    History of Present Illness Patient is a 68 year old woman with diabetes peripheral vascular disease who is status post foot salvage surgery intervention who presents at this time after failure foot salvage for a transtibial amputation of RLE. 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 Practiced posterior scoot from bed to chair this AM, requires mod assist +2. Motivated to improve and tolerated therapeutic exercises well today. Patient will continue to benefit from skilled physical therapy services to further improve independence with functional mobility.    Follow Up Recommendations  SNF;Supervision/Assistance - 24 hour     Equipment Recommendations  None recommended by PT    Recommendations for Other Services       Precautions / Restrictions Precautions Precautions: Fall Precaution Comments: bil BKA Restrictions Weight Bearing Restrictions: Yes RLE Weight Bearing: Non weight bearing    Mobility  Bed Mobility Overal bed mobility: Needs Assistance;+2 for physical assistance Bed Mobility: Rolling;Supine to Sit Rolling: Min assist   Supine to sit: Min assist;+2 for physical assistance;HOB elevated     General bed mobility comments: Min assist to roll towards pt left side, +2 min assist for supine to sit with HOB elevated and VC for technique.  Transfers Overall transfer level: Needs assistance Equipment used: 2 person hand held assist Transfers: Comptroller transfers: Mod assist;+2 physical assistance   General transfer comment: VC for technique and weight shiftting. +2 mod assist for scooting posteriorly from bed to chair.  Ambulation/Gait                 Stairs            Wheelchair Mobility    Modified Rankin (Stroke  Patients Only)       Balance                                    Cognition Arousal/Alertness: Awake/alert Behavior During Therapy: WFL for tasks assessed/performed Overall Cognitive Status: Within Functional Limits for tasks assessed                      Exercises Amputee Exercises Quad Sets: Strengthening;Both;10 reps;Seated Hip Flexion/Marching: Strengthening;Right;10 reps;Seated Knee Flexion: AROM;Strengthening;Right;10 reps;Seated Knee Extension: AROM;Strengthening;Right;10 reps;Seated    General Comments        Pertinent Vitals/Pain Pain Assessment: 0-10 Pain Score: 2  Pain Location: Rt LE Pain Descriptors / Indicators: Cramping Pain Intervention(s): Monitored during session;Repositioned    Home Living                      Prior Function            PT Goals (current goals can now be found in the care plan section) Acute Rehab PT Goals PT Goal Formulation: With patient Time For Goal Achievement: 05/19/14 Progress towards PT goals: Progressing toward goals    Frequency  Min 3X/week    PT Plan Current plan remains appropriate    Co-evaluation             End of Session Equipment Utilized During Treatment: Gait belt Activity Tolerance: Patient tolerated treatment well Patient left: in chair;with call bell/phone within reach     Time: 1941-7408 PT Time Calculation (  min): 19 min  Charges:  $Therapeutic Activity: 8-22 mins                    G Codes:      Elayne Snare, [place]  Ellouise Newer 05/07/2014, 10:44 AM

## 2014-05-08 ENCOUNTER — Encounter (HOSPITAL_COMMUNITY): Payer: Self-pay | Admitting: Orthopedic Surgery

## 2014-05-11 DIAGNOSIS — T879 Unspecified complications of amputation stump: Secondary | ICD-10-CM | POA: Diagnosis not present

## 2014-05-11 DIAGNOSIS — M109 Gout, unspecified: Secondary | ICD-10-CM | POA: Diagnosis not present

## 2014-05-11 DIAGNOSIS — F329 Major depressive disorder, single episode, unspecified: Secondary | ICD-10-CM | POA: Diagnosis not present

## 2014-05-11 DIAGNOSIS — E782 Mixed hyperlipidemia: Secondary | ICD-10-CM | POA: Diagnosis not present

## 2014-06-11 ENCOUNTER — Ambulatory Visit: Payer: Medicare Other | Admitting: Pulmonary Disease

## 2014-06-19 DIAGNOSIS — F4320 Adjustment disorder, unspecified: Secondary | ICD-10-CM | POA: Diagnosis not present

## 2014-06-29 DIAGNOSIS — R05 Cough: Secondary | ICD-10-CM | POA: Diagnosis not present

## 2014-07-17 DIAGNOSIS — N3942 Incontinence without sensory awareness: Secondary | ICD-10-CM | POA: Diagnosis not present

## 2014-07-17 DIAGNOSIS — R339 Retention of urine, unspecified: Secondary | ICD-10-CM | POA: Diagnosis not present

## 2014-07-17 DIAGNOSIS — N3020 Other chronic cystitis without hematuria: Secondary | ICD-10-CM | POA: Diagnosis not present

## 2014-08-23 DIAGNOSIS — I70261 Atherosclerosis of native arteries of extremities with gangrene, right leg: Secondary | ICD-10-CM | POA: Diagnosis not present

## 2014-08-23 DIAGNOSIS — Z89511 Acquired absence of right leg below knee: Secondary | ICD-10-CM | POA: Diagnosis not present

## 2014-08-23 DIAGNOSIS — E1142 Type 2 diabetes mellitus with diabetic polyneuropathy: Secondary | ICD-10-CM | POA: Diagnosis not present

## 2014-10-03 DIAGNOSIS — N390 Urinary tract infection, site not specified: Secondary | ICD-10-CM | POA: Diagnosis not present

## 2014-10-12 DIAGNOSIS — S88912A Complete traumatic amputation of left lower leg, level unspecified, initial encounter: Secondary | ICD-10-CM | POA: Diagnosis not present

## 2014-10-12 DIAGNOSIS — I129 Hypertensive chronic kidney disease with stage 1 through stage 4 chronic kidney disease, or unspecified chronic kidney disease: Secondary | ICD-10-CM | POA: Diagnosis not present

## 2014-10-12 DIAGNOSIS — Z89519 Acquired absence of unspecified leg below knee: Secondary | ICD-10-CM | POA: Diagnosis not present

## 2014-10-12 DIAGNOSIS — S88911A Complete traumatic amputation of right lower leg, level unspecified, initial encounter: Secondary | ICD-10-CM | POA: Diagnosis not present

## 2014-10-19 DIAGNOSIS — Z89519 Acquired absence of unspecified leg below knee: Secondary | ICD-10-CM | POA: Diagnosis not present

## 2014-10-19 DIAGNOSIS — E669 Obesity, unspecified: Secondary | ICD-10-CM | POA: Diagnosis not present

## 2014-10-19 DIAGNOSIS — Z85038 Personal history of other malignant neoplasm of large intestine: Secondary | ICD-10-CM | POA: Diagnosis not present

## 2014-10-19 DIAGNOSIS — Z452 Encounter for adjustment and management of vascular access device: Secondary | ICD-10-CM | POA: Diagnosis not present

## 2014-10-19 DIAGNOSIS — R262 Difficulty in walking, not elsewhere classified: Secondary | ICD-10-CM | POA: Diagnosis not present

## 2014-10-19 DIAGNOSIS — E1151 Type 2 diabetes mellitus with diabetic peripheral angiopathy without gangrene: Secondary | ICD-10-CM | POA: Diagnosis not present

## 2014-10-19 DIAGNOSIS — E662 Morbid (severe) obesity with alveolar hypoventilation: Secondary | ICD-10-CM | POA: Diagnosis not present

## 2014-10-19 DIAGNOSIS — I70263 Atherosclerosis of native arteries of extremities with gangrene, bilateral legs: Secondary | ICD-10-CM | POA: Diagnosis not present

## 2014-10-19 DIAGNOSIS — Z6838 Body mass index (BMI) 38.0-38.9, adult: Secondary | ICD-10-CM | POA: Diagnosis not present

## 2014-10-19 DIAGNOSIS — I70261 Atherosclerosis of native arteries of extremities with gangrene, right leg: Secondary | ICD-10-CM | POA: Diagnosis not present

## 2014-10-19 DIAGNOSIS — E1165 Type 2 diabetes mellitus with hyperglycemia: Secondary | ICD-10-CM | POA: Diagnosis not present

## 2014-10-19 DIAGNOSIS — M6258 Muscle wasting and atrophy, not elsewhere classified, other site: Secondary | ICD-10-CM | POA: Diagnosis not present

## 2014-10-19 DIAGNOSIS — I509 Heart failure, unspecified: Secondary | ICD-10-CM | POA: Diagnosis not present

## 2014-10-19 DIAGNOSIS — I129 Hypertensive chronic kidney disease with stage 1 through stage 4 chronic kidney disease, or unspecified chronic kidney disease: Secondary | ICD-10-CM | POA: Diagnosis not present

## 2014-10-19 DIAGNOSIS — M6281 Muscle weakness (generalized): Secondary | ICD-10-CM | POA: Diagnosis not present

## 2014-10-19 DIAGNOSIS — R278 Other lack of coordination: Secondary | ICD-10-CM | POA: Diagnosis not present

## 2014-10-19 DIAGNOSIS — Z882 Allergy status to sulfonamides status: Secondary | ICD-10-CM | POA: Diagnosis not present

## 2014-10-19 DIAGNOSIS — Z89512 Acquired absence of left leg below knee: Secondary | ICD-10-CM | POA: Diagnosis not present

## 2014-10-19 DIAGNOSIS — Z0000 Encounter for general adult medical examination without abnormal findings: Secondary | ICD-10-CM | POA: Diagnosis not present

## 2014-10-19 DIAGNOSIS — L97411 Non-pressure chronic ulcer of right heel and midfoot limited to breakdown of skin: Secondary | ICD-10-CM | POA: Diagnosis not present

## 2014-10-19 DIAGNOSIS — I739 Peripheral vascular disease, unspecified: Secondary | ICD-10-CM | POA: Diagnosis not present

## 2014-10-19 DIAGNOSIS — Z79899 Other long term (current) drug therapy: Secondary | ICD-10-CM | POA: Diagnosis not present

## 2014-10-19 DIAGNOSIS — M6250 Muscle wasting and atrophy, not elsewhere classified, unspecified site: Secondary | ICD-10-CM | POA: Diagnosis not present

## 2014-10-19 DIAGNOSIS — E782 Mixed hyperlipidemia: Secondary | ICD-10-CM | POA: Diagnosis not present

## 2014-10-19 DIAGNOSIS — R2689 Other abnormalities of gait and mobility: Secondary | ICD-10-CM | POA: Diagnosis not present

## 2014-10-19 DIAGNOSIS — Z89511 Acquired absence of right leg below knee: Secondary | ICD-10-CM | POA: Diagnosis not present

## 2014-10-19 DIAGNOSIS — N390 Urinary tract infection, site not specified: Secondary | ICD-10-CM | POA: Diagnosis not present

## 2014-10-19 DIAGNOSIS — E1159 Type 2 diabetes mellitus with other circulatory complications: Secondary | ICD-10-CM | POA: Diagnosis not present

## 2014-10-19 DIAGNOSIS — E1142 Type 2 diabetes mellitus with diabetic polyneuropathy: Secondary | ICD-10-CM | POA: Diagnosis not present

## 2014-10-19 DIAGNOSIS — Z9049 Acquired absence of other specified parts of digestive tract: Secondary | ICD-10-CM | POA: Diagnosis not present

## 2014-10-19 DIAGNOSIS — R2681 Unsteadiness on feet: Secondary | ICD-10-CM | POA: Diagnosis not present

## 2014-10-19 DIAGNOSIS — Z1389 Encounter for screening for other disorder: Secondary | ICD-10-CM | POA: Diagnosis not present

## 2014-10-19 DIAGNOSIS — I6529 Occlusion and stenosis of unspecified carotid artery: Secondary | ICD-10-CM | POA: Diagnosis not present

## 2014-10-19 DIAGNOSIS — I10 Essential (primary) hypertension: Secondary | ICD-10-CM | POA: Diagnosis not present

## 2014-10-19 DIAGNOSIS — M79662 Pain in left lower leg: Secondary | ICD-10-CM | POA: Diagnosis not present

## 2014-10-22 DIAGNOSIS — I70261 Atherosclerosis of native arteries of extremities with gangrene, right leg: Secondary | ICD-10-CM | POA: Diagnosis not present

## 2014-10-22 DIAGNOSIS — Z89511 Acquired absence of right leg below knee: Secondary | ICD-10-CM | POA: Diagnosis not present

## 2014-10-22 DIAGNOSIS — E1142 Type 2 diabetes mellitus with diabetic polyneuropathy: Secondary | ICD-10-CM | POA: Diagnosis not present

## 2014-10-28 DIAGNOSIS — N390 Urinary tract infection, site not specified: Secondary | ICD-10-CM | POA: Diagnosis not present

## 2014-11-13 DIAGNOSIS — E1165 Type 2 diabetes mellitus with hyperglycemia: Secondary | ICD-10-CM | POA: Diagnosis not present

## 2014-11-13 DIAGNOSIS — Z882 Allergy status to sulfonamides status: Secondary | ICD-10-CM | POA: Diagnosis not present

## 2014-11-13 DIAGNOSIS — I739 Peripheral vascular disease, unspecified: Secondary | ICD-10-CM | POA: Diagnosis not present

## 2014-11-13 DIAGNOSIS — N390 Urinary tract infection, site not specified: Secondary | ICD-10-CM | POA: Diagnosis not present

## 2014-11-17 DIAGNOSIS — Z89519 Acquired absence of unspecified leg below knee: Secondary | ICD-10-CM | POA: Diagnosis not present

## 2014-11-17 DIAGNOSIS — E1151 Type 2 diabetes mellitus with diabetic peripheral angiopathy without gangrene: Secondary | ICD-10-CM | POA: Diagnosis not present

## 2014-11-17 DIAGNOSIS — I129 Hypertensive chronic kidney disease with stage 1 through stage 4 chronic kidney disease, or unspecified chronic kidney disease: Secondary | ICD-10-CM | POA: Diagnosis not present

## 2014-11-19 DIAGNOSIS — Z89511 Acquired absence of right leg below knee: Secondary | ICD-10-CM | POA: Diagnosis not present

## 2014-11-19 DIAGNOSIS — I70261 Atherosclerosis of native arteries of extremities with gangrene, right leg: Secondary | ICD-10-CM | POA: Diagnosis not present

## 2014-11-19 DIAGNOSIS — E1142 Type 2 diabetes mellitus with diabetic polyneuropathy: Secondary | ICD-10-CM | POA: Diagnosis not present

## 2014-12-12 ENCOUNTER — Ambulatory Visit: Payer: Medicare Other | Admitting: Pulmonary Disease

## 2014-12-12 ENCOUNTER — Ambulatory Visit (INDEPENDENT_AMBULATORY_CARE_PROVIDER_SITE_OTHER): Payer: Medicare Other | Admitting: Pulmonary Disease

## 2014-12-12 ENCOUNTER — Encounter: Payer: Self-pay | Admitting: Pulmonary Disease

## 2014-12-12 VITALS — BP 122/64 | HR 60 | Temp 98.0°F | Wt 222.0 lb

## 2014-12-12 DIAGNOSIS — E662 Morbid (severe) obesity with alveolar hypoventilation: Secondary | ICD-10-CM | POA: Diagnosis not present

## 2014-12-12 NOTE — Assessment & Plan Note (Signed)
The patient continues to do well with her bilevel device, and is satisfied with her sleep and how she feels the next day. He is continuing to work on weight loss, and has lost further weight since our last visit. I have asked her to continue on her device, and to keep working on weight loss. She will follow-up again in one year.

## 2014-12-12 NOTE — Progress Notes (Signed)
   Subjective:    Patient ID: [name], female    DOB: May 09, 1946, 69 y.o.   MRN: [id number]  HPI The patient comes in today for follow-up of her obesity hypoventilation syndrome. She is continuing on her bilevel device along with oxygen, and is satisfied with how she sleeps at night. She has lost further weight since last visit, and is overdue for new supplies. She is having no issues with her mask fit or pressure.   Review of Systems  Constitutional: Negative for fever and unexpected weight change.  HENT: Negative for congestion, dental problem, ear pain, nosebleeds, postnasal drip, rhinorrhea, sinus pressure, sneezing, sore throat and trouble swallowing.   Eyes: Negative for redness and itching.  Respiratory: Negative for cough, chest tightness, shortness of breath and wheezing.   Cardiovascular: Negative for palpitations and leg swelling.  Gastrointestinal: Negative for nausea and vomiting.  Genitourinary: Negative for dysuria.  Musculoskeletal: Negative for joint swelling.  Skin: Negative for rash.  Neurological: Negative for headaches.  Hematological: Does not bruise/bleed easily.  Psychiatric/Behavioral: Negative for dysphoric mood. The patient is not nervous/anxious.        Objective:   Physical Exam Obese female in no acute distress Nose without purulence or discharge noted Neck without lymphadenopathy or thyromegaly No skin breakdown or pressure necrosis from the C Pap mask Lower extremities with bilateral BKA Alert and oriented, does not appear to be sleepy, moves all 4 extremities.       Assessment & Plan:

## 2014-12-12 NOTE — Patient Instructions (Signed)
Continue on bipap, and work on further weight loss. Will send an order to your homecare company to get you new supplies. followup with Dr. Halford Chessman in one year.

## 2014-12-15 DIAGNOSIS — N390 Urinary tract infection, site not specified: Secondary | ICD-10-CM | POA: Diagnosis not present

## 2014-12-18 DIAGNOSIS — I70263 Atherosclerosis of native arteries of extremities with gangrene, bilateral legs: Secondary | ICD-10-CM | POA: Diagnosis not present

## 2014-12-18 DIAGNOSIS — Z89511 Acquired absence of right leg below knee: Secondary | ICD-10-CM | POA: Diagnosis not present

## 2014-12-18 DIAGNOSIS — E1142 Type 2 diabetes mellitus with diabetic polyneuropathy: Secondary | ICD-10-CM | POA: Diagnosis not present

## 2014-12-22 DIAGNOSIS — E782 Mixed hyperlipidemia: Secondary | ICD-10-CM | POA: Diagnosis not present

## 2014-12-22 DIAGNOSIS — Z89519 Acquired absence of unspecified leg below knee: Secondary | ICD-10-CM | POA: Diagnosis not present

## 2014-12-22 DIAGNOSIS — I509 Heart failure, unspecified: Secondary | ICD-10-CM | POA: Diagnosis not present

## 2014-12-22 DIAGNOSIS — E1151 Type 2 diabetes mellitus with diabetic peripheral angiopathy without gangrene: Secondary | ICD-10-CM | POA: Diagnosis not present

## 2015-01-16 DIAGNOSIS — Z79899 Other long term (current) drug therapy: Secondary | ICD-10-CM | POA: Diagnosis not present

## 2015-01-17 DIAGNOSIS — L97411 Non-pressure chronic ulcer of right heel and midfoot limited to breakdown of skin: Secondary | ICD-10-CM | POA: Diagnosis not present

## 2015-01-23 DIAGNOSIS — Z9049 Acquired absence of other specified parts of digestive tract: Secondary | ICD-10-CM | POA: Diagnosis not present

## 2015-01-23 DIAGNOSIS — Z85038 Personal history of other malignant neoplasm of large intestine: Secondary | ICD-10-CM | POA: Diagnosis not present

## 2015-01-23 DIAGNOSIS — Z1389 Encounter for screening for other disorder: Secondary | ICD-10-CM | POA: Diagnosis not present

## 2015-01-23 DIAGNOSIS — I6529 Occlusion and stenosis of unspecified carotid artery: Secondary | ICD-10-CM | POA: Diagnosis not present

## 2015-01-23 DIAGNOSIS — Z0000 Encounter for general adult medical examination without abnormal findings: Secondary | ICD-10-CM | POA: Diagnosis not present

## 2015-01-23 DIAGNOSIS — Z89519 Acquired absence of unspecified leg below knee: Secondary | ICD-10-CM | POA: Diagnosis not present

## 2015-01-23 DIAGNOSIS — Z6838 Body mass index (BMI) 38.0-38.9, adult: Secondary | ICD-10-CM | POA: Diagnosis not present

## 2015-01-29 DIAGNOSIS — I10 Essential (primary) hypertension: Secondary | ICD-10-CM | POA: Diagnosis not present

## 2015-01-29 DIAGNOSIS — E1159 Type 2 diabetes mellitus with other circulatory complications: Secondary | ICD-10-CM | POA: Diagnosis not present

## 2015-01-29 DIAGNOSIS — M6281 Muscle weakness (generalized): Secondary | ICD-10-CM | POA: Diagnosis not present

## 2015-01-29 DIAGNOSIS — F419 Anxiety disorder, unspecified: Secondary | ICD-10-CM | POA: Diagnosis not present

## 2015-01-29 DIAGNOSIS — I509 Heart failure, unspecified: Secondary | ICD-10-CM | POA: Diagnosis not present

## 2015-01-29 DIAGNOSIS — M1990 Unspecified osteoarthritis, unspecified site: Secondary | ICD-10-CM | POA: Diagnosis not present

## 2015-01-30 DIAGNOSIS — M6281 Muscle weakness (generalized): Secondary | ICD-10-CM | POA: Diagnosis not present

## 2015-01-30 DIAGNOSIS — F419 Anxiety disorder, unspecified: Secondary | ICD-10-CM | POA: Diagnosis not present

## 2015-01-30 DIAGNOSIS — E1159 Type 2 diabetes mellitus with other circulatory complications: Secondary | ICD-10-CM | POA: Diagnosis not present

## 2015-01-30 DIAGNOSIS — I10 Essential (primary) hypertension: Secondary | ICD-10-CM | POA: Diagnosis not present

## 2015-01-30 DIAGNOSIS — I509 Heart failure, unspecified: Secondary | ICD-10-CM | POA: Diagnosis not present

## 2015-01-30 DIAGNOSIS — M1990 Unspecified osteoarthritis, unspecified site: Secondary | ICD-10-CM | POA: Diagnosis not present

## 2015-02-04 DIAGNOSIS — M6281 Muscle weakness (generalized): Secondary | ICD-10-CM | POA: Diagnosis not present

## 2015-02-04 DIAGNOSIS — F419 Anxiety disorder, unspecified: Secondary | ICD-10-CM | POA: Diagnosis not present

## 2015-02-04 DIAGNOSIS — I509 Heart failure, unspecified: Secondary | ICD-10-CM | POA: Diagnosis not present

## 2015-02-04 DIAGNOSIS — M1990 Unspecified osteoarthritis, unspecified site: Secondary | ICD-10-CM | POA: Diagnosis not present

## 2015-02-04 DIAGNOSIS — E1159 Type 2 diabetes mellitus with other circulatory complications: Secondary | ICD-10-CM | POA: Diagnosis not present

## 2015-02-04 DIAGNOSIS — I10 Essential (primary) hypertension: Secondary | ICD-10-CM | POA: Diagnosis not present

## 2015-02-05 DIAGNOSIS — I10 Essential (primary) hypertension: Secondary | ICD-10-CM | POA: Diagnosis not present

## 2015-02-05 DIAGNOSIS — F419 Anxiety disorder, unspecified: Secondary | ICD-10-CM | POA: Diagnosis not present

## 2015-02-05 DIAGNOSIS — I509 Heart failure, unspecified: Secondary | ICD-10-CM | POA: Diagnosis not present

## 2015-02-05 DIAGNOSIS — M1990 Unspecified osteoarthritis, unspecified site: Secondary | ICD-10-CM | POA: Diagnosis not present

## 2015-02-05 DIAGNOSIS — M6281 Muscle weakness (generalized): Secondary | ICD-10-CM | POA: Diagnosis not present

## 2015-02-05 DIAGNOSIS — E1159 Type 2 diabetes mellitus with other circulatory complications: Secondary | ICD-10-CM | POA: Diagnosis not present

## 2015-02-07 DIAGNOSIS — I10 Essential (primary) hypertension: Secondary | ICD-10-CM | POA: Diagnosis not present

## 2015-02-07 DIAGNOSIS — I509 Heart failure, unspecified: Secondary | ICD-10-CM | POA: Diagnosis not present

## 2015-02-07 DIAGNOSIS — F419 Anxiety disorder, unspecified: Secondary | ICD-10-CM | POA: Diagnosis not present

## 2015-02-07 DIAGNOSIS — M6281 Muscle weakness (generalized): Secondary | ICD-10-CM | POA: Diagnosis not present

## 2015-02-07 DIAGNOSIS — M1990 Unspecified osteoarthritis, unspecified site: Secondary | ICD-10-CM | POA: Diagnosis not present

## 2015-02-07 DIAGNOSIS — E1159 Type 2 diabetes mellitus with other circulatory complications: Secondary | ICD-10-CM | POA: Diagnosis not present

## 2015-02-08 DIAGNOSIS — I509 Heart failure, unspecified: Secondary | ICD-10-CM | POA: Diagnosis not present

## 2015-02-08 DIAGNOSIS — M1990 Unspecified osteoarthritis, unspecified site: Secondary | ICD-10-CM | POA: Diagnosis not present

## 2015-02-08 DIAGNOSIS — I10 Essential (primary) hypertension: Secondary | ICD-10-CM | POA: Diagnosis not present

## 2015-02-08 DIAGNOSIS — F419 Anxiety disorder, unspecified: Secondary | ICD-10-CM | POA: Diagnosis not present

## 2015-02-08 DIAGNOSIS — M6281 Muscle weakness (generalized): Secondary | ICD-10-CM | POA: Diagnosis not present

## 2015-02-08 DIAGNOSIS — E1159 Type 2 diabetes mellitus with other circulatory complications: Secondary | ICD-10-CM | POA: Diagnosis not present

## 2015-02-12 DIAGNOSIS — I509 Heart failure, unspecified: Secondary | ICD-10-CM | POA: Diagnosis not present

## 2015-02-12 DIAGNOSIS — I10 Essential (primary) hypertension: Secondary | ICD-10-CM | POA: Diagnosis not present

## 2015-02-12 DIAGNOSIS — F419 Anxiety disorder, unspecified: Secondary | ICD-10-CM | POA: Diagnosis not present

## 2015-02-12 DIAGNOSIS — M1990 Unspecified osteoarthritis, unspecified site: Secondary | ICD-10-CM | POA: Diagnosis not present

## 2015-02-12 DIAGNOSIS — E1159 Type 2 diabetes mellitus with other circulatory complications: Secondary | ICD-10-CM | POA: Diagnosis not present

## 2015-02-12 DIAGNOSIS — M6281 Muscle weakness (generalized): Secondary | ICD-10-CM | POA: Diagnosis not present

## 2015-02-13 DIAGNOSIS — R5381 Other malaise: Secondary | ICD-10-CM | POA: Diagnosis not present

## 2015-02-13 DIAGNOSIS — Z89519 Acquired absence of unspecified leg below knee: Secondary | ICD-10-CM | POA: Diagnosis not present

## 2015-02-13 DIAGNOSIS — I129 Hypertensive chronic kidney disease with stage 1 through stage 4 chronic kidney disease, or unspecified chronic kidney disease: Secondary | ICD-10-CM | POA: Diagnosis not present

## 2015-02-13 DIAGNOSIS — N390 Urinary tract infection, site not specified: Secondary | ICD-10-CM | POA: Diagnosis not present

## 2015-02-14 DIAGNOSIS — E1159 Type 2 diabetes mellitus with other circulatory complications: Secondary | ICD-10-CM | POA: Diagnosis not present

## 2015-02-14 DIAGNOSIS — M1990 Unspecified osteoarthritis, unspecified site: Secondary | ICD-10-CM | POA: Diagnosis not present

## 2015-02-14 DIAGNOSIS — M6281 Muscle weakness (generalized): Secondary | ICD-10-CM | POA: Diagnosis not present

## 2015-02-14 DIAGNOSIS — I509 Heart failure, unspecified: Secondary | ICD-10-CM | POA: Diagnosis not present

## 2015-02-14 DIAGNOSIS — F419 Anxiety disorder, unspecified: Secondary | ICD-10-CM | POA: Diagnosis not present

## 2015-02-14 DIAGNOSIS — I10 Essential (primary) hypertension: Secondary | ICD-10-CM | POA: Diagnosis not present

## 2015-02-19 DIAGNOSIS — I509 Heart failure, unspecified: Secondary | ICD-10-CM | POA: Diagnosis not present

## 2015-02-19 DIAGNOSIS — E1159 Type 2 diabetes mellitus with other circulatory complications: Secondary | ICD-10-CM | POA: Diagnosis not present

## 2015-02-19 DIAGNOSIS — M1990 Unspecified osteoarthritis, unspecified site: Secondary | ICD-10-CM | POA: Diagnosis not present

## 2015-02-19 DIAGNOSIS — F419 Anxiety disorder, unspecified: Secondary | ICD-10-CM | POA: Diagnosis not present

## 2015-02-19 DIAGNOSIS — M6281 Muscle weakness (generalized): Secondary | ICD-10-CM | POA: Diagnosis not present

## 2015-02-19 DIAGNOSIS — I10 Essential (primary) hypertension: Secondary | ICD-10-CM | POA: Diagnosis not present

## 2015-02-20 DIAGNOSIS — M1990 Unspecified osteoarthritis, unspecified site: Secondary | ICD-10-CM | POA: Diagnosis not present

## 2015-02-20 DIAGNOSIS — E1159 Type 2 diabetes mellitus with other circulatory complications: Secondary | ICD-10-CM | POA: Diagnosis not present

## 2015-02-20 DIAGNOSIS — I10 Essential (primary) hypertension: Secondary | ICD-10-CM | POA: Diagnosis not present

## 2015-02-20 DIAGNOSIS — F419 Anxiety disorder, unspecified: Secondary | ICD-10-CM | POA: Diagnosis not present

## 2015-02-20 DIAGNOSIS — I509 Heart failure, unspecified: Secondary | ICD-10-CM | POA: Diagnosis not present

## 2015-02-20 DIAGNOSIS — M6281 Muscle weakness (generalized): Secondary | ICD-10-CM | POA: Diagnosis not present

## 2015-02-21 DIAGNOSIS — M6281 Muscle weakness (generalized): Secondary | ICD-10-CM | POA: Diagnosis not present

## 2015-02-21 DIAGNOSIS — F419 Anxiety disorder, unspecified: Secondary | ICD-10-CM | POA: Diagnosis not present

## 2015-02-21 DIAGNOSIS — I509 Heart failure, unspecified: Secondary | ICD-10-CM | POA: Diagnosis not present

## 2015-02-21 DIAGNOSIS — E1159 Type 2 diabetes mellitus with other circulatory complications: Secondary | ICD-10-CM | POA: Diagnosis not present

## 2015-02-21 DIAGNOSIS — M1990 Unspecified osteoarthritis, unspecified site: Secondary | ICD-10-CM | POA: Diagnosis not present

## 2015-02-21 DIAGNOSIS — I10 Essential (primary) hypertension: Secondary | ICD-10-CM | POA: Diagnosis not present

## 2015-02-26 DIAGNOSIS — I10 Essential (primary) hypertension: Secondary | ICD-10-CM | POA: Diagnosis not present

## 2015-02-26 DIAGNOSIS — M1990 Unspecified osteoarthritis, unspecified site: Secondary | ICD-10-CM | POA: Diagnosis not present

## 2015-02-26 DIAGNOSIS — I509 Heart failure, unspecified: Secondary | ICD-10-CM | POA: Diagnosis not present

## 2015-02-26 DIAGNOSIS — E1159 Type 2 diabetes mellitus with other circulatory complications: Secondary | ICD-10-CM | POA: Diagnosis not present

## 2015-02-26 DIAGNOSIS — F419 Anxiety disorder, unspecified: Secondary | ICD-10-CM | POA: Diagnosis not present

## 2015-02-26 DIAGNOSIS — M6281 Muscle weakness (generalized): Secondary | ICD-10-CM | POA: Diagnosis not present

## 2015-02-28 DIAGNOSIS — M1990 Unspecified osteoarthritis, unspecified site: Secondary | ICD-10-CM | POA: Diagnosis not present

## 2015-02-28 DIAGNOSIS — E1159 Type 2 diabetes mellitus with other circulatory complications: Secondary | ICD-10-CM | POA: Diagnosis not present

## 2015-02-28 DIAGNOSIS — I509 Heart failure, unspecified: Secondary | ICD-10-CM | POA: Diagnosis not present

## 2015-02-28 DIAGNOSIS — M6281 Muscle weakness (generalized): Secondary | ICD-10-CM | POA: Diagnosis not present

## 2015-02-28 DIAGNOSIS — I10 Essential (primary) hypertension: Secondary | ICD-10-CM | POA: Diagnosis not present

## 2015-02-28 DIAGNOSIS — F419 Anxiety disorder, unspecified: Secondary | ICD-10-CM | POA: Diagnosis not present

## 2015-03-08 DIAGNOSIS — I10 Essential (primary) hypertension: Secondary | ICD-10-CM | POA: Diagnosis not present

## 2015-03-08 DIAGNOSIS — M1990 Unspecified osteoarthritis, unspecified site: Secondary | ICD-10-CM | POA: Diagnosis not present

## 2015-03-08 DIAGNOSIS — M6281 Muscle weakness (generalized): Secondary | ICD-10-CM | POA: Diagnosis not present

## 2015-03-08 DIAGNOSIS — F419 Anxiety disorder, unspecified: Secondary | ICD-10-CM | POA: Diagnosis not present

## 2015-03-08 DIAGNOSIS — E1159 Type 2 diabetes mellitus with other circulatory complications: Secondary | ICD-10-CM | POA: Diagnosis not present

## 2015-03-08 DIAGNOSIS — I509 Heart failure, unspecified: Secondary | ICD-10-CM | POA: Diagnosis not present

## 2015-03-11 ENCOUNTER — Telehealth: Payer: Self-pay | Admitting: Pulmonary Disease

## 2015-03-11 DIAGNOSIS — G4733 Obstructive sleep apnea (adult) (pediatric): Secondary | ICD-10-CM

## 2015-03-11 NOTE — Telephone Encounter (Signed)
Spoke with pt. Order will be placed for CPAP supplies and mask of choice. Nothing further was needed.

## 2015-03-11 NOTE — Telephone Encounter (Signed)
Called spoke with pt. [place] order for pt to get new CPAP supplies and mask back in May but never received these. Now [facility] needs new order. Dr. Halford Chessman please advise if okay to send under your name? thanks

## 2015-03-11 NOTE — Telephone Encounter (Signed)
Okay to send order. 

## 2015-03-18 DIAGNOSIS — E1151 Type 2 diabetes mellitus with diabetic peripheral angiopathy without gangrene: Secondary | ICD-10-CM | POA: Diagnosis not present

## 2015-03-18 DIAGNOSIS — E118 Type 2 diabetes mellitus with unspecified complications: Secondary | ICD-10-CM | POA: Diagnosis not present

## 2015-03-18 DIAGNOSIS — Z23 Encounter for immunization: Secondary | ICD-10-CM | POA: Diagnosis not present

## 2015-03-18 DIAGNOSIS — E782 Mixed hyperlipidemia: Secondary | ICD-10-CM | POA: Diagnosis not present

## 2015-03-18 DIAGNOSIS — I129 Hypertensive chronic kidney disease with stage 1 through stage 4 chronic kidney disease, or unspecified chronic kidney disease: Secondary | ICD-10-CM | POA: Diagnosis not present

## 2015-03-18 DIAGNOSIS — E1165 Type 2 diabetes mellitus with hyperglycemia: Secondary | ICD-10-CM | POA: Diagnosis not present

## 2015-03-19 ENCOUNTER — Encounter: Payer: Self-pay | Admitting: Internal Medicine

## 2015-03-19 IMAGING — MG MM DIGITAL SCREENING BILAT W/ CAD
5 series · 5 of 5 positions shown · non-contrast
Comparison: Previous exam(s)

CLINICAL DATA: Screening. The patient is in a wheelchair and images
are to the best of the patient's and technologist's abilities.

EXAM:
DIGITAL SCREENING BILATERAL MAMMOGRAM WITH CAD

[R CC]
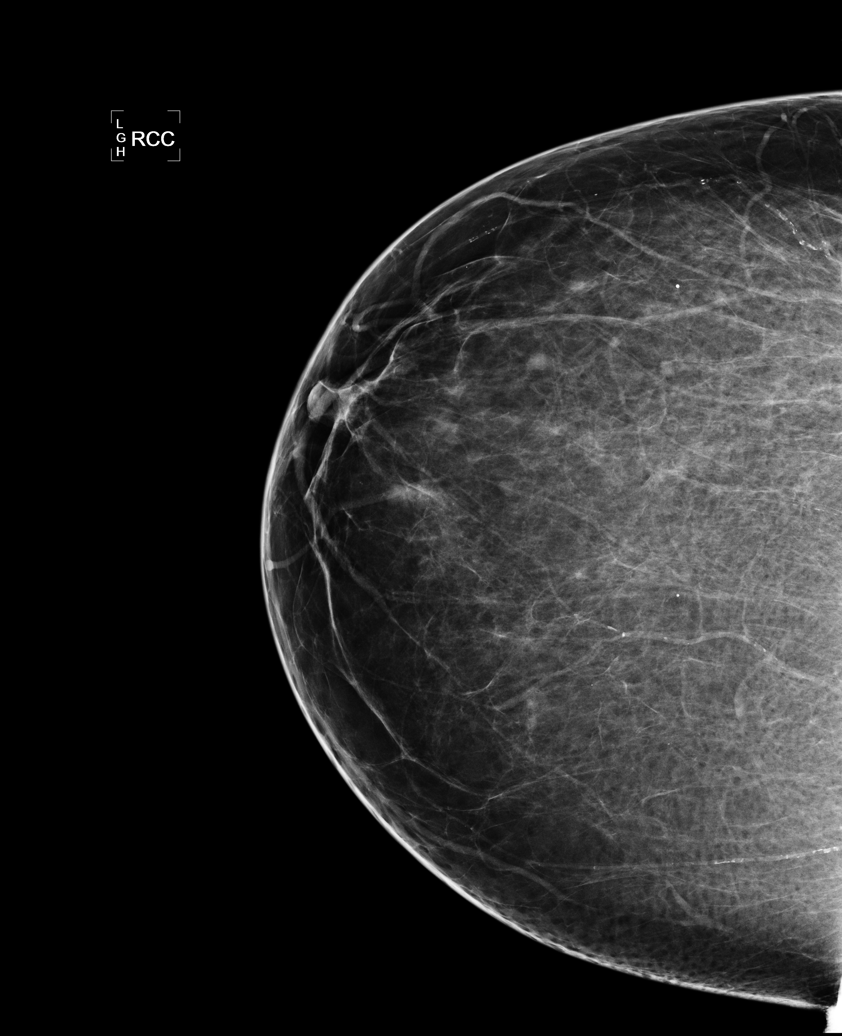

[L CC]
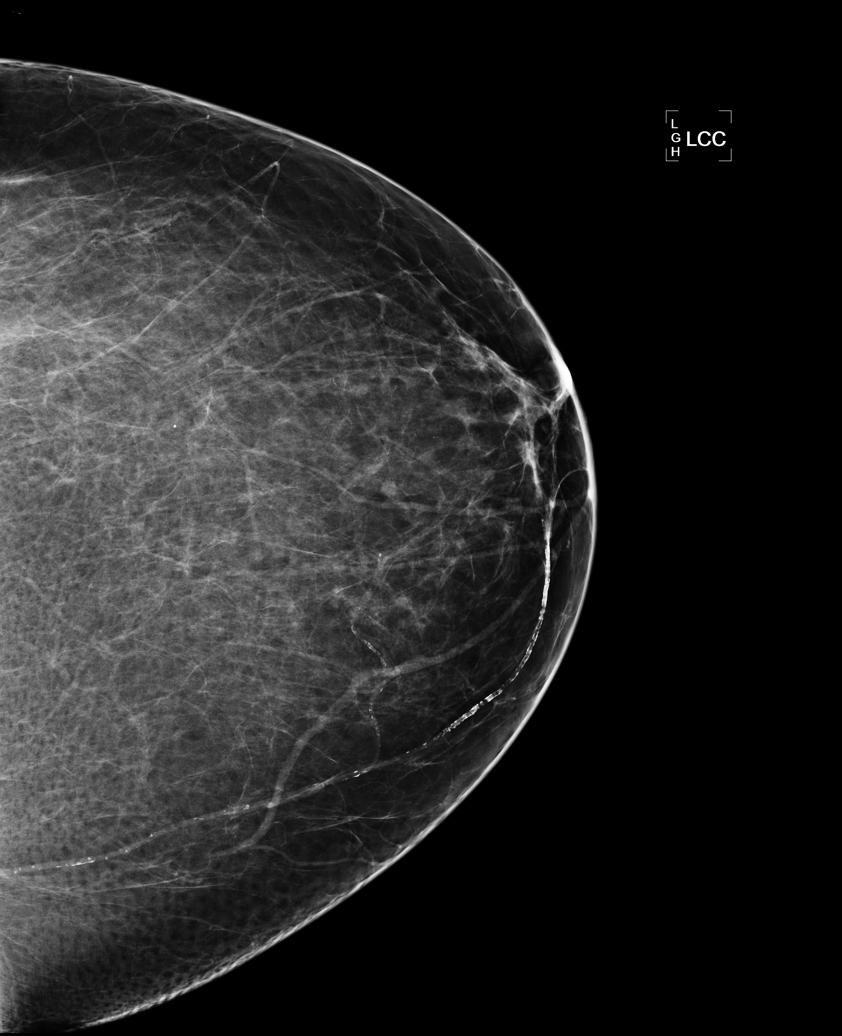

[L MLO (1 of 2)]
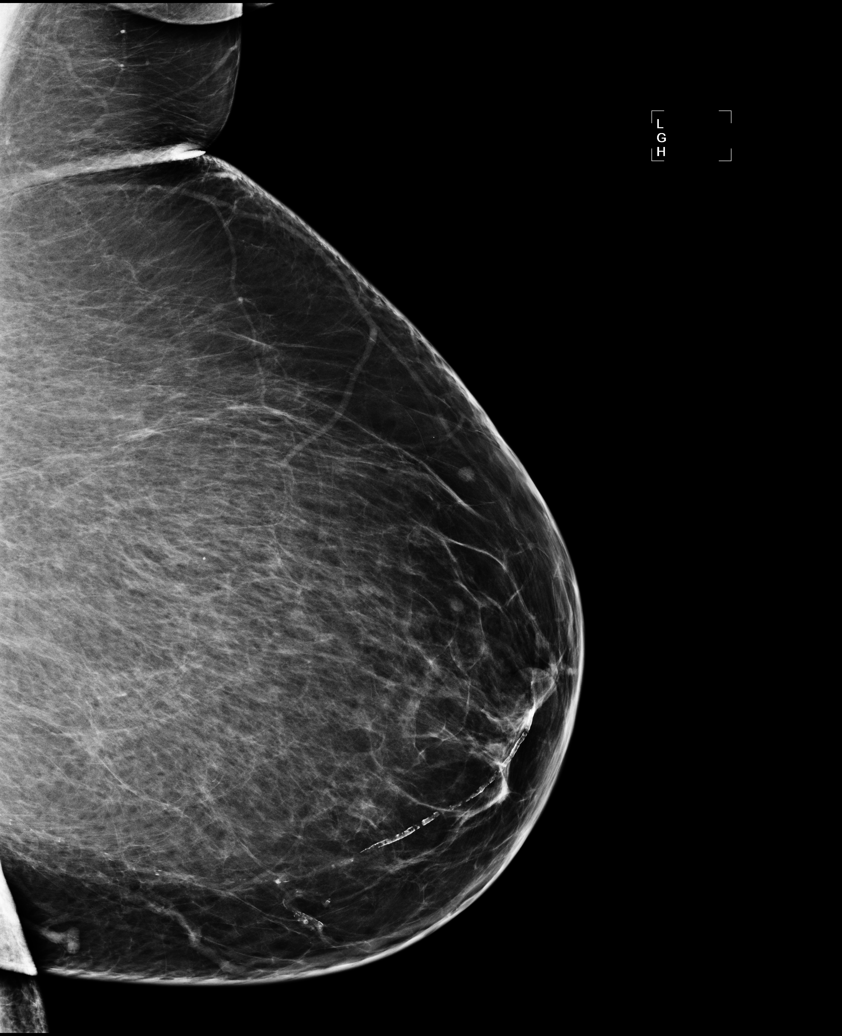

[R MLO]
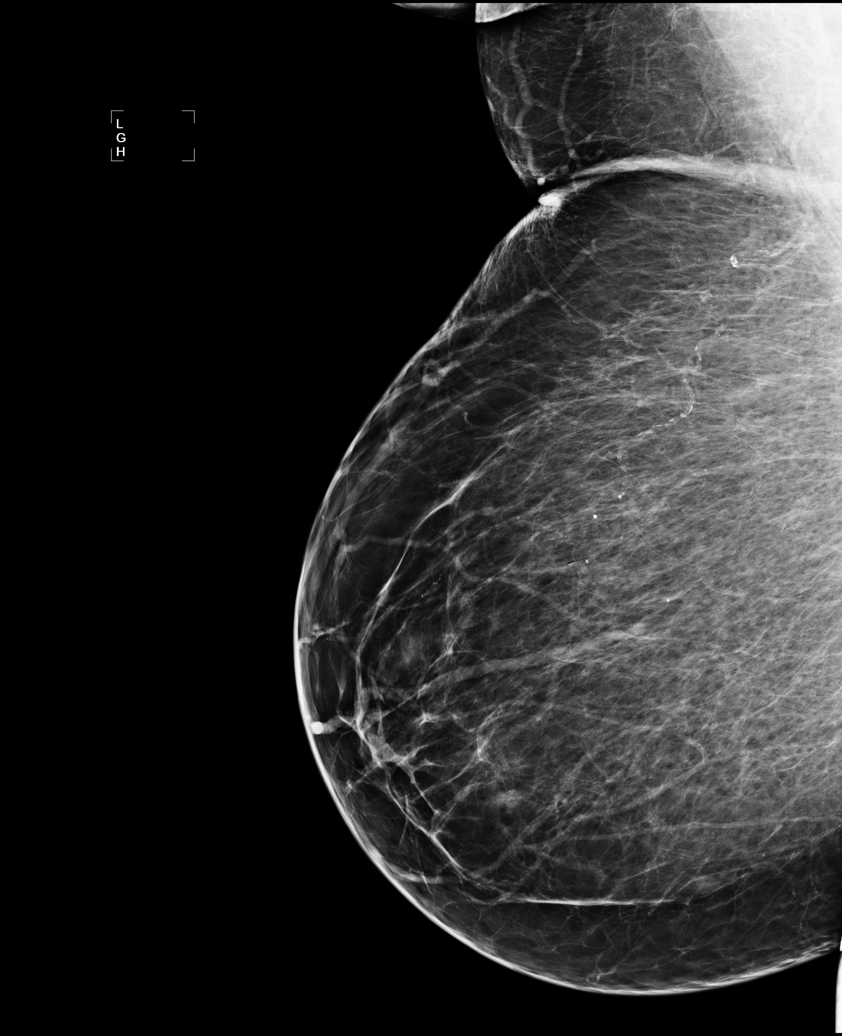

[L MLO (2 of 2)]
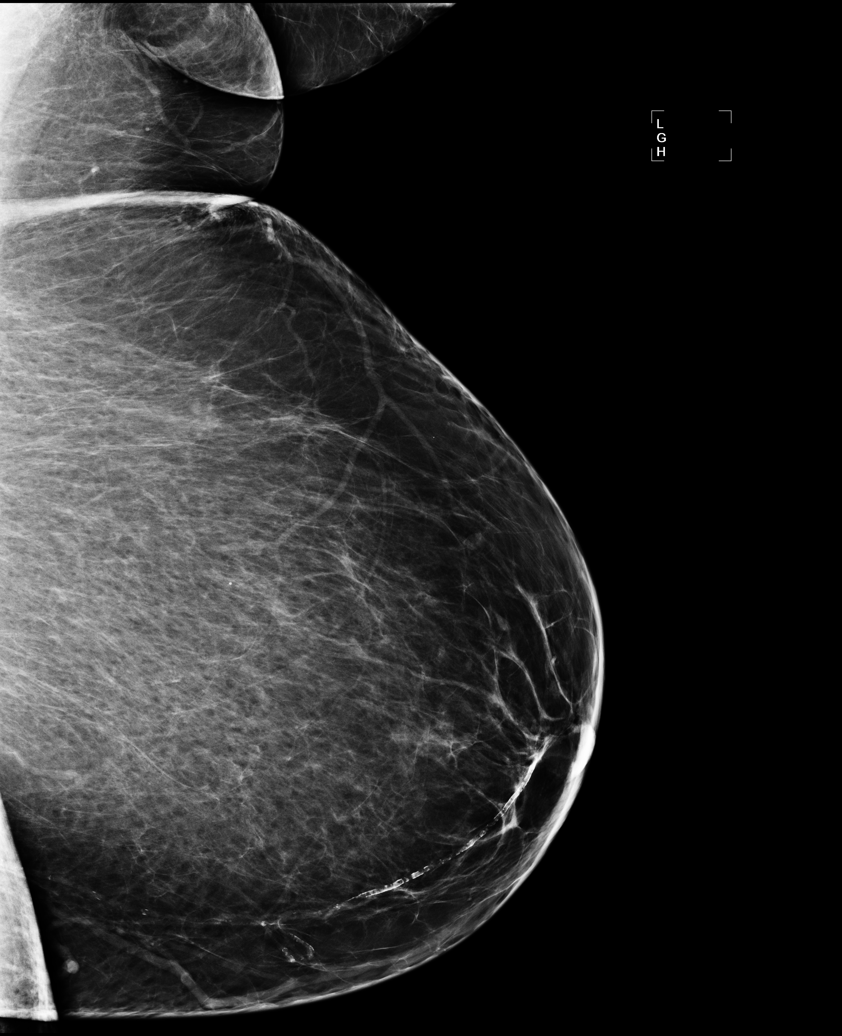

[5 of 5 positions shown; findings below may reference images not displayed]

ACR Breast Density Category b: There are scattered areas of
fibroglandular density.
FINDINGS: In the right breast, a possible mass warrants further evaluation
with spot compression views and possibly ultrasound. In the left
breast, no findings suspicious for malignancy. Images were processed
with CAD.
IMPRESSION: Further evaluation is suggested for possible mass in the right
breast.

RECOMMENDATION:
Diagnostic mammogram and possibly ultrasound of the right breast.
(Code:[id number]-[id number])

The patient will be contacted regarding the findings, and additional
imaging will be scheduled.

BI-RADS CATEGORY  0: Incomplete. Need additional imaging evaluation
and/or prior mammograms for comparison.

## 2015-03-20 DIAGNOSIS — I10 Essential (primary) hypertension: Secondary | ICD-10-CM | POA: Diagnosis not present

## 2015-03-20 DIAGNOSIS — F419 Anxiety disorder, unspecified: Secondary | ICD-10-CM | POA: Diagnosis not present

## 2015-03-20 DIAGNOSIS — M1990 Unspecified osteoarthritis, unspecified site: Secondary | ICD-10-CM | POA: Diagnosis not present

## 2015-03-20 DIAGNOSIS — I509 Heart failure, unspecified: Secondary | ICD-10-CM | POA: Diagnosis not present

## 2015-03-20 DIAGNOSIS — M6281 Muscle weakness (generalized): Secondary | ICD-10-CM | POA: Diagnosis not present

## 2015-03-20 DIAGNOSIS — E1159 Type 2 diabetes mellitus with other circulatory complications: Secondary | ICD-10-CM | POA: Diagnosis not present

## 2015-03-21 ENCOUNTER — Ambulatory Visit (HOSPITAL_COMMUNITY)
Admission: RE | Admit: 2015-03-21 | Discharge: 2015-03-21 | Disposition: A | Discharge status: Home / Self Care | Payer: Medicare Other | Source: Ambulatory Visit | Attending: Internal Medicine | Admitting: Internal Medicine

## 2015-03-21 VITALS — BP 142/68 | HR 72 | Wt 246.0 lb

## 2015-03-21 DIAGNOSIS — G4733 Obstructive sleep apnea (adult) (pediatric): Secondary | ICD-10-CM | POA: Diagnosis not present

## 2015-03-21 DIAGNOSIS — Z7982 Long term (current) use of aspirin: Secondary | ICD-10-CM | POA: Insufficient documentation

## 2015-03-21 DIAGNOSIS — I10 Essential (primary) hypertension: Secondary | ICD-10-CM | POA: Insufficient documentation

## 2015-03-21 DIAGNOSIS — I272 Other secondary pulmonary hypertension: Secondary | ICD-10-CM | POA: Insufficient documentation

## 2015-03-21 DIAGNOSIS — Z89512 Acquired absence of left leg below knee: Secondary | ICD-10-CM | POA: Diagnosis not present

## 2015-03-21 DIAGNOSIS — Z79899 Other long term (current) drug therapy: Secondary | ICD-10-CM | POA: Insufficient documentation

## 2015-03-21 DIAGNOSIS — I5042 Chronic combined systolic (congestive) and diastolic (congestive) heart failure: Secondary | ICD-10-CM | POA: Insufficient documentation

## 2015-03-21 DIAGNOSIS — E662 Morbid (severe) obesity with alveolar hypoventilation: Secondary | ICD-10-CM | POA: Diagnosis not present

## 2015-03-21 DIAGNOSIS — Z89511 Acquired absence of right leg below knee: Secondary | ICD-10-CM | POA: Diagnosis not present

## 2015-03-21 DIAGNOSIS — F419 Anxiety disorder, unspecified: Secondary | ICD-10-CM | POA: Insufficient documentation

## 2015-03-21 DIAGNOSIS — E119 Type 2 diabetes mellitus without complications: Secondary | ICD-10-CM | POA: Diagnosis not present

## 2015-03-21 DIAGNOSIS — I6523 Occlusion and stenosis of bilateral carotid arteries: Secondary | ICD-10-CM | POA: Diagnosis not present

## 2015-03-21 DIAGNOSIS — Z8672 Personal history of thrombophlebitis: Secondary | ICD-10-CM | POA: Insufficient documentation

## 2015-03-21 DIAGNOSIS — Z85038 Personal history of other malignant neoplasm of large intestine: Secondary | ICD-10-CM | POA: Diagnosis not present

## 2015-03-21 DIAGNOSIS — Z794 Long term (current) use of insulin: Secondary | ICD-10-CM | POA: Diagnosis not present

## 2015-03-21 DIAGNOSIS — I5032 Chronic diastolic (congestive) heart failure: Secondary | ICD-10-CM | POA: Diagnosis not present

## 2015-03-21 DIAGNOSIS — Z885 Allergy status to narcotic agent status: Secondary | ICD-10-CM | POA: Diagnosis not present

## 2015-03-21 NOTE — Addendum Note (Signed)
Encounter addended by: Scarlette Calico, RN on: 03/21/2015  3:39 PM<BR>     Documentation filed: Allergies

## 2015-03-21 NOTE — Addendum Note (Signed)
Encounter addended by: Scarlette Calico, RN on: 03/21/2015  3:26 PM<BR>     Documentation filed: Dx Association, Patient Instructions Section, Orders

## 2015-03-21 NOTE — Patient Instructions (Signed)
Your physician has requested that you have an echocardiogram. Echocardiography is a painless test that uses sound waves to create images of your heart. It provides your doctor with information about the size and shape of your heart and how well your heart's chambers and valves are working. This procedure takes approximately one hour. There are no restrictions for this procedure.  Your physician has requested that you have a carotid duplex. This test is an ultrasound of the carotid arteries in your neck. It looks at blood flow through these arteries that supply the brain with blood. Allow one hour for this exam. There are no restrictions or special instructions.  We will contact you in 6 months to schedule your next appointment.

## 2015-03-21 NOTE — Progress Notes (Signed)
Patient ID: [name], female   DOB: 07/18/1946, 69 y.o.   MRN: [id number] Primary Cardiologist:  Dr. Glori Bickers PCP: Dr. Joan Flores in [place]  HPI: [name] is a 69 y.o. female with a history of morbid obesity complicated by obesity hypoventilation syndrome, PAD s/p bilateral BKA, hypertension, sleep apnea, diabetes, CRI, diastolic HF and mild secondary pulmonary hypertension. Carotid stenosis 40-59% bilaterally.    Initially, she had an echocardiogram read as severe LV dysfunction with an EF of 15-20%. She underwent catheterization in 2008 that showed minimal nonobstructive coronary artery disease with EF 55%. She had mild pulmonary hypertension with both pulmonary venous and pulmonary arterial components. Her PVR was 5.1 Wood units. Echo in 3/09 EF 60%.    Admitted to [facility] in 2013 due to hyperkalemia and acute renal failure. This was thought to be from Bactroban and spironolactone. Evaluated by Dr Macarthur Critchley. Recommendations to stop spironolactone/ARB.   Echo 12/13/13: EF 40-45% with anteroseptal HK. grade 3 DD RV ok  She returns for follow up. Since last time we saw her, she has had her other leg amputated. Now s/p bilateral BKA. Also had her mother and husband die. Despite this she is doing very well. Has bilateral prostheses. Walks with walker. Volume status doing well. No CP, orthopnea, PND. Labs with Dr. Simone Curia yesterday looked good.    Labs  02/07/13 K 4.1 Creatinine 1.2  12/14 Cr 3.1  10/15 K 4.3 creatinine 1.38  She is not on spironolactone/ARB due to acute renal failure and hyperkalemia.   ROS: All systems negative except as listed in HPI, PMH and Problem List.  Past Medical History  Diagnosis Date  . OSA (obstructive sleep apnea)   . Thrombophlebitis of deep vein of leg   . DM (diabetes mellitus)   . CHF (congestive heart failure)     hx of EF 15-20% in 4/08,  echo 3/09 EF 60-65%;   echo 6/12: EF 55-60%, mild LAE  . Carotid stenosis     u/s 6/10: R 0-39%Ll  40-59%;  u/s 7/12: 40-59% bilateral (repeat in 7/13)  . HTN (hypertension)   . Morbid obesity   . Pulmonary HTN     mild,  cath 6/08 with PVR 5.1  . Hypokalemia   . 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 Spinal stenosis   . Obesity hypoventilation syndrome   . Cancer     hx of colon CA 2000  . Arthritis   . Complication of anesthesia     Oxygen drops   . PONV (postoperative nausea and vomiting)     after colon surgery 2000  . Head injury, acute, without loss of consciousness   . Anxiety   . History of blood transfusion   . Family history of anesthesia complication     Mother N/V     Current Outpatient Prescriptions on File Prior to Encounter  Medication Sig Dispense Refill  . acetaminophen (TYLENOL) 500 MG tablet Take 500 mg by mouth every 6 (six) hours as needed.    Marland Kitchen allopurinol (ZYLOPRIM) 300 MG tablet Take 300 mg by mouth daily.    Marland Kitchen amLODipine (NORVASC) 5 MG tablet Take 5 mg by mouth daily.    Marland Kitchen aspirin (BAYER ASPIRIN) 325 MG tablet Take 325 mg by mouth daily.      . carvedilol (COREG) 3.125 MG tablet Take 3.125 mg by mouth 2 (two) times daily with a meal.     . docusate sodium (  COLACE) 100 MG capsule Take 100 mg by mouth 2 (two) times daily.    . ferrous sulfate 325 (65 FE) MG tablet Take 325 mg by mouth daily with breakfast.    . LEVEMIR 100 UNIT/ML injection Use as directed    . potassium chloride SA (K-DUR,KLOR-CON) 20 MEQ tablet Take 20 mEq by mouth daily.    . sertraline (ZOLOFT) 100 MG tablet Take 50 mg by mouth daily.     Marland Kitchen torsemide (DEMADEX) 20 MG tablet Take 60 mg by mouth daily.    . Vitamin D, Ergocalciferol, (DRISDOL) 50000 UNITS CAPS Take 50,000 Units by mouth 2 (two) times a week. Take on Mon and Thurs.    . pravastatin (PRAVACHOL) 40 MG tablet Take 1 tablet by mouth daily.     No current facility-administered medications on file prior to encounter.   Allergies  Allergen Reactions  . Aldactone [Spironolactone] Other (See  Comments)    ARF  . Codeine Other (See Comments)    Causes confusion  . Meperidine Hcl Other (See Comments)    Increase in heart rate (demerol)  . Metformin Nausea And Vomiting     PHYSICAL EXAM: Filed Vitals:   03/21/15 1443  BP: 142/68  Pulse: 72  Weight: 246 lb (111.585 kg)  SpO2: 94%    General: Looks good sitting in WC. No resp difficulty. Sister present HEENT: normal Neck: supple. JVP flat  Carotids 2+ bilaterally; no bruits. No lymphadenopathy or thryomegaly appreciated. Cor: PMI normal. Regular rate & rhythm. No rubs, gallops or murmurs. Lungs: clear Abdomen: obese,  nontender, mildly distended. No hepatosplenomegaly. No bruits or masses. Good bowel sounds. Extremities:  No cyanosis or clubbing. S/p BKA no edema Neuro: alert & orientedx3, cranial nerves grossly intact. Moves all 4 extremities w/o difficulty. Affect pleasant.  ASSESSMENT & PLAN:  1) Chronic systolic/diastolic HF - EF 42-87% 6/81 - Volume status looks great. Renal function looks ok - Continue daily weights, low sodium diet, and fluid restriction.  - doing great. Repeat echo to ensure stability LVEF.   2) Carotid stenosis - 40-59% bilaterally in 6/15 - Asx - Will schedule repeat. Continue ASA, statin.  3) HTN -Blood pressure well controlled. Continue current regimen.   F/u 6 months  Bensimhon, Daniel MD 3:10 PM

## 2015-03-27 IMAGING — US US BREAST LTD UNI RIGHT INC AXILLA
1 series · 6 of 6 positions shown · non-contrast
Comparison: 03/14/2014 and earlier

CLINICAL DATA: Patient returns after screening study for evaluation
of the right breast.

EXAM:
DIGITAL DIAGNOSTIC  RIGHT MAMMOGRAM
ULTRASOUND RIGHT BREAST

[Series 1: us breast ltd uni right inc axilla · 6 of 6 slices shown]
[im 1/6]
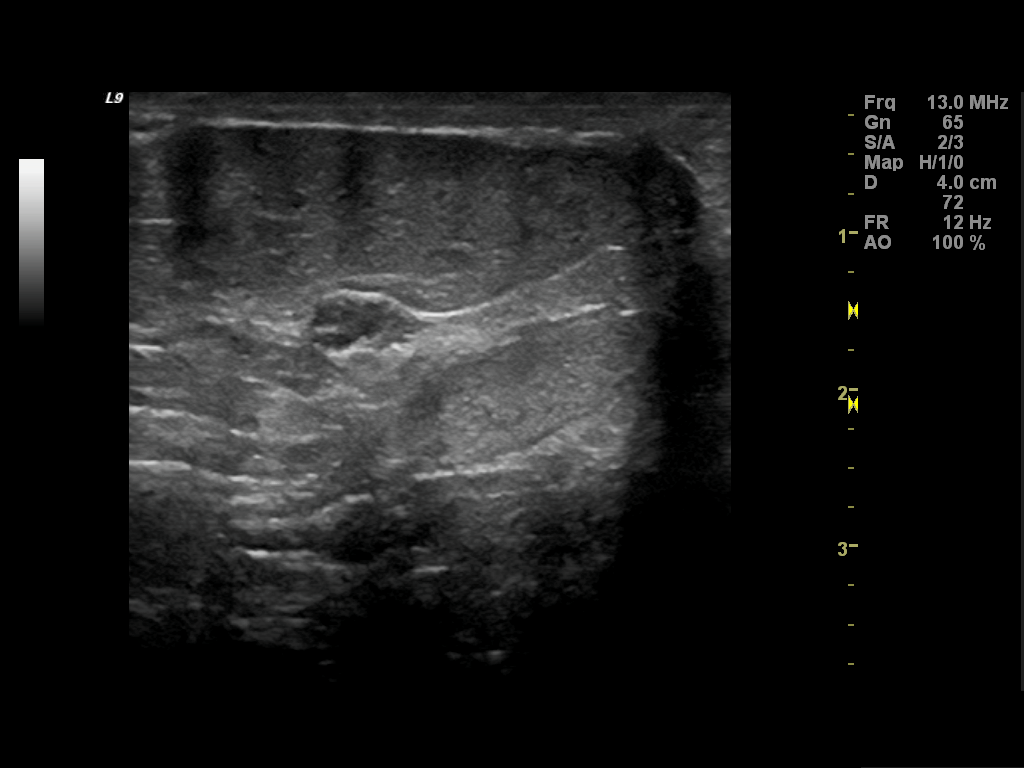
[im 2/6]
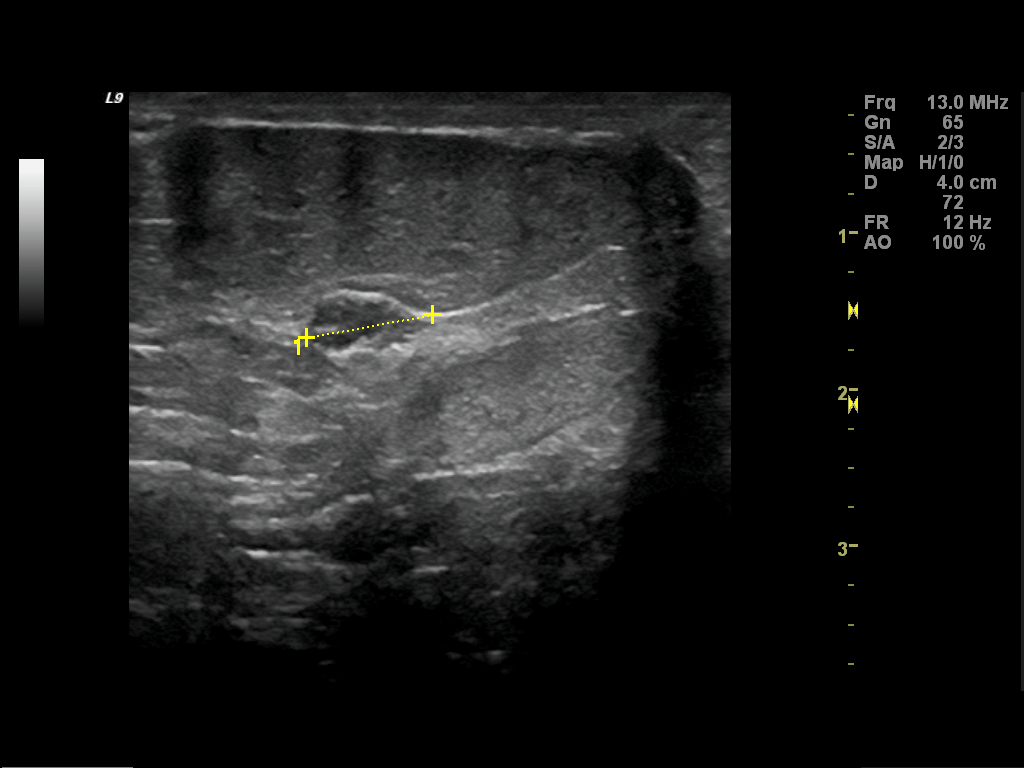
[im 3/6]
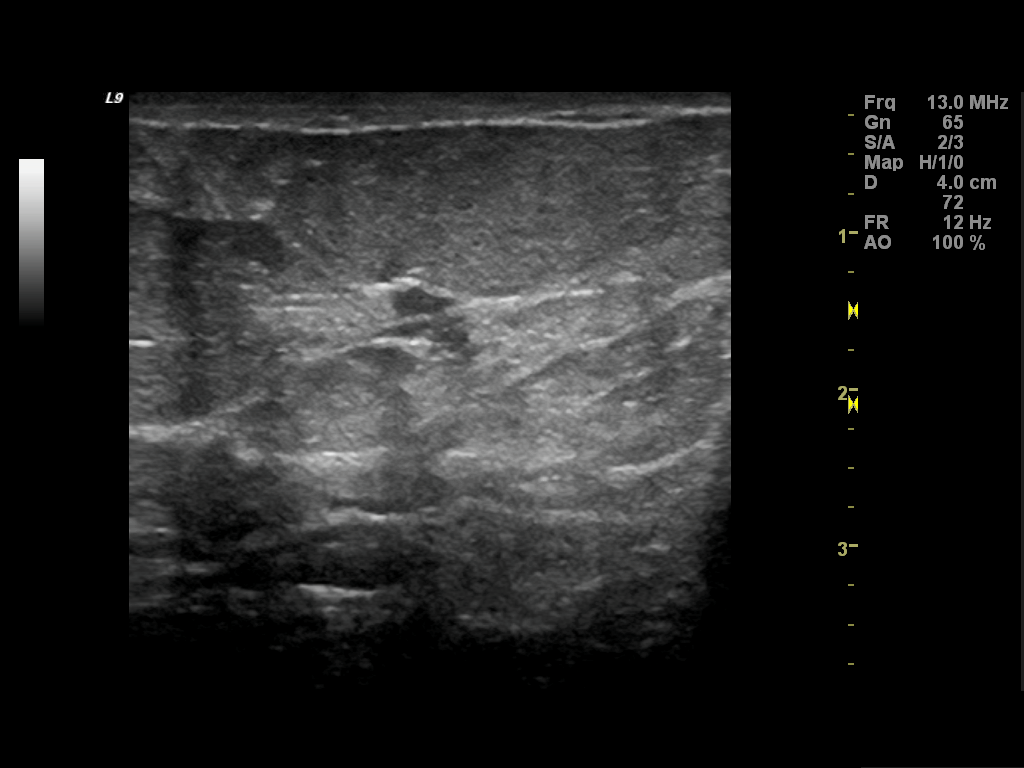
[im 4/6]
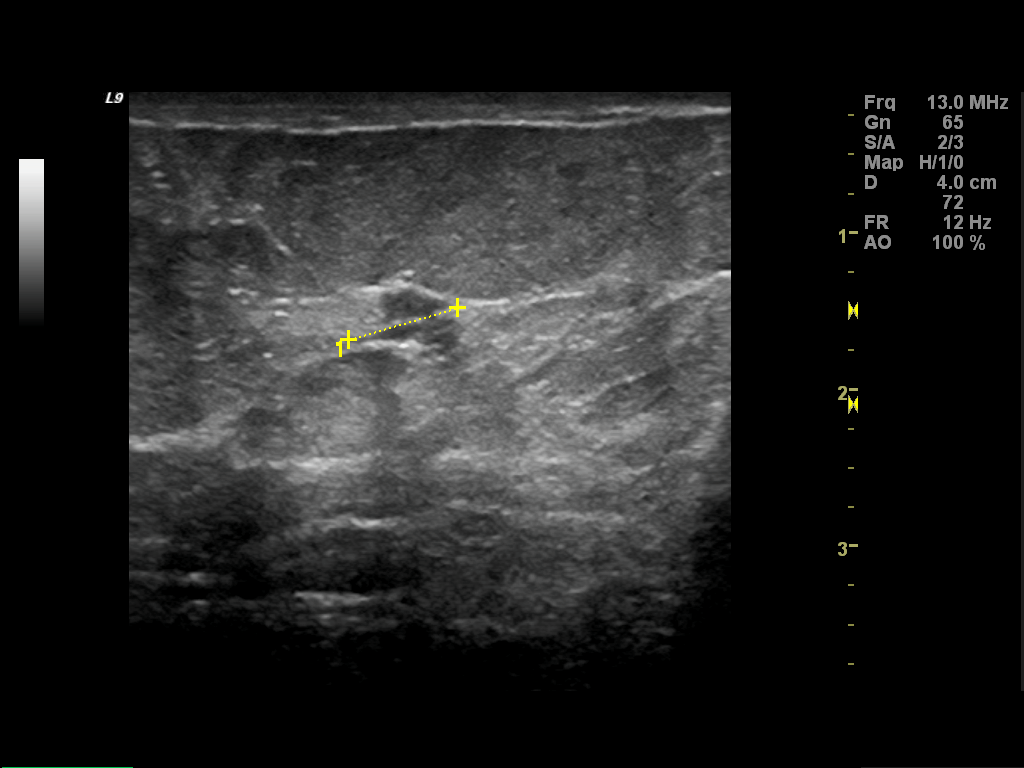
[im 5/6]
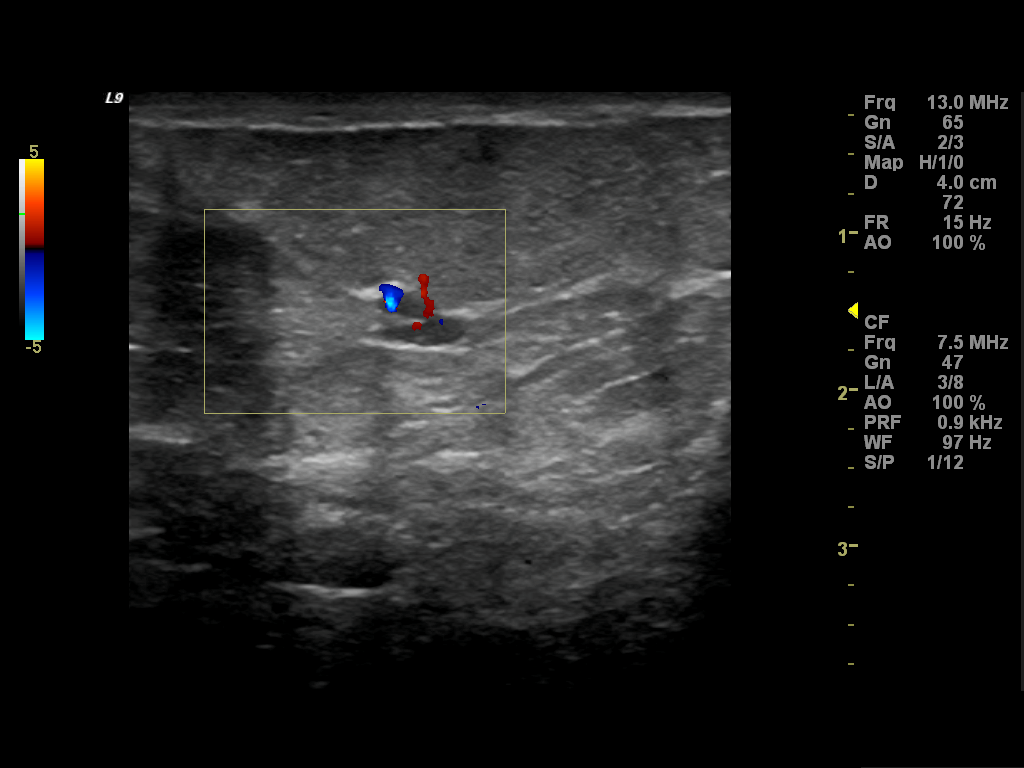
[im 6/6]
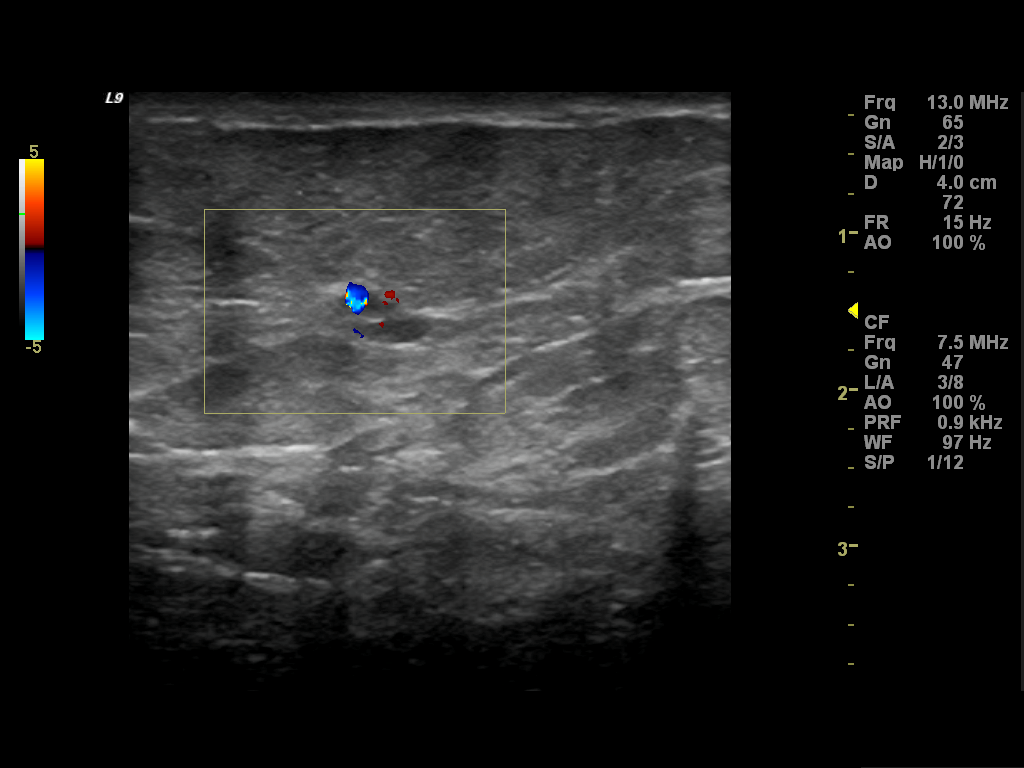

[6 of 6 positions shown; findings below may reference images not displayed]

ACR Breast Density Category b: There are scattered areas of
fibroglandular density.
FINDINGS: Additional views are performed, showing persistent irregular density
just below the level of the right nipple. No associated
microcalcifications are definite mass.

On physical exam, I palpate no abnormality be low the right nipple.

Ultrasound is performed, showing a small irregular hypoechoic nodule
in the 6 o'clock location of the right breast 3 cm from the nipple.
This is associated with blood flow by Doppler evaluation and
measures 0.7 x 0.8 cm. The patient is scanned in the wheelchair.
IMPRESSION: 1. Small irregular mass in the 6 o'clock location of the right
breast which biopsy is recommended.
2. The patient takes 325 mg of aspirin daily. She has a history of
congestive heart failure.

RECOMMENDATION:
1. Ultrasound-guided core biopsy is recommended and has been
scheduled for the patient on 03/30/2014.
2. I would plan to continue daily aspirin given the patient's
history of congestive heart failure. We discussed the slightly
higher chance of bleeding and bruising due to the biopsy.

I have discussed the findings and recommendations with the patient.
Results were also provided in writing at the conclusion of the
visit. If applicable, a reminder letter will be sent to the patient
regarding the next appointment.

BI-RADS CATEGORY  4: Suspicious.

## 2015-03-29 ENCOUNTER — Other Ambulatory Visit: Payer: Self-pay

## 2015-03-29 ENCOUNTER — Ambulatory Visit (HOSPITAL_COMMUNITY)
Admission: RE | Admit: 2015-03-29 | Discharge: 2015-03-29 | Disposition: A | Discharge status: Home / Self Care | Payer: Medicare Other | Source: Ambulatory Visit | Attending: Internal Medicine | Admitting: Internal Medicine

## 2015-03-29 ENCOUNTER — Ambulatory Visit (HOSPITAL_BASED_OUTPATIENT_CLINIC_OR_DEPARTMENT_OTHER): Payer: Medicare Other

## 2015-03-29 DIAGNOSIS — I340 Nonrheumatic mitral (valve) insufficiency: Secondary | ICD-10-CM | POA: Insufficient documentation

## 2015-03-29 DIAGNOSIS — E785 Hyperlipidemia, unspecified: Secondary | ICD-10-CM | POA: Diagnosis not present

## 2015-03-29 DIAGNOSIS — I5032 Chronic diastolic (congestive) heart failure: Secondary | ICD-10-CM | POA: Diagnosis not present

## 2015-03-29 DIAGNOSIS — E119 Type 2 diabetes mellitus without complications: Secondary | ICD-10-CM | POA: Insufficient documentation

## 2015-03-29 DIAGNOSIS — I509 Heart failure, unspecified: Secondary | ICD-10-CM | POA: Diagnosis not present

## 2015-03-29 DIAGNOSIS — I10 Essential (primary) hypertension: Secondary | ICD-10-CM | POA: Diagnosis not present

## 2015-03-29 DIAGNOSIS — I517 Cardiomegaly: Secondary | ICD-10-CM | POA: Insufficient documentation

## 2015-03-29 DIAGNOSIS — Z6841 Body Mass Index (BMI) 40.0 and over, adult: Secondary | ICD-10-CM | POA: Diagnosis not present

## 2015-03-29 DIAGNOSIS — E669 Obesity, unspecified: Secondary | ICD-10-CM | POA: Diagnosis not present

## 2015-03-29 DIAGNOSIS — I6523 Occlusion and stenosis of bilateral carotid arteries: Secondary | ICD-10-CM | POA: Diagnosis not present

## 2015-04-01 DIAGNOSIS — Z794 Long term (current) use of insulin: Secondary | ICD-10-CM | POA: Diagnosis not present

## 2015-04-01 DIAGNOSIS — E119 Type 2 diabetes mellitus without complications: Secondary | ICD-10-CM | POA: Diagnosis not present

## 2015-04-01 DIAGNOSIS — Z961 Presence of intraocular lens: Secondary | ICD-10-CM | POA: Diagnosis not present

## 2015-04-10 DIAGNOSIS — I129 Hypertensive chronic kidney disease with stage 1 through stage 4 chronic kidney disease, or unspecified chronic kidney disease: Secondary | ICD-10-CM | POA: Diagnosis not present

## 2015-04-11 ENCOUNTER — Telehealth: Payer: Self-pay | Admitting: Emergency Medicine

## 2015-04-11 DIAGNOSIS — G4733 Obstructive sleep apnea (adult) (pediatric): Secondary | ICD-10-CM

## 2015-04-11 NOTE — Telephone Encounter (Signed)
Order has been placed.  LMTCB for [facility]

## 2015-04-11 NOTE — Telephone Encounter (Signed)
Spoke with Leafy Ro at [place], states that pt told [facility]'s respiratory therapist that she used to be on 02 qhs through another DME provider and was eventually taken off.  Pt told pt that she feels like she needs 02 qhs again.  [facility] is requesting we order an ONO on bipap to see if pt needs this.   Pt is a former [facility] pt, and it looks like VS approved the order for her new bipap supplies.  VS are you ok with this order?  Thanks!

## 2015-04-11 NOTE — Telephone Encounter (Signed)
Okay to send order for ONO with BiPAP.

## 2015-04-11 NOTE — Telephone Encounter (Signed)
Spoke with [facility], aware of order.  Nothing further needed.

## 2015-04-16 ENCOUNTER — Telehealth: Payer: Self-pay | Admitting: Pulmonary Disease

## 2015-04-16 DIAGNOSIS — G4733 Obstructive sleep apnea (adult) (pediatric): Secondary | ICD-10-CM | POA: Diagnosis not present

## 2015-04-16 NOTE — Telephone Encounter (Signed)
ONO placed in VS look at. Please advise thanks

## 2015-04-17 ENCOUNTER — Telehealth (HOSPITAL_COMMUNITY): Payer: Self-pay | Admitting: *Deleted

## 2015-04-17 NOTE — Telephone Encounter (Signed)
Gave pt echo and carotid u/s results

## 2015-04-17 NOTE — Telephone Encounter (Signed)
Pt was followed by Dr. Gwenette Greet, and had been on BiPAP.  ONO 04/15/15 >> Test time 7 hrs 58 min.  Basal SpO2 91%, low SpO2 80%.  Spent 68.8 min with SpO2 < 88%.  The report does not specify if test was done with pt using BiPAP or on room air only.  Please check with [facility] if patient was using BiPAP when she did ONO.  If she was not using BiPAP, then this test is meaningless.  If she was using BiPAP, then she might need supplemental oxygen at night.

## 2015-04-17 NOTE — Telephone Encounter (Signed)
lmtcb x1 with Mandy.

## 2015-04-17 NOTE — Telephone Encounter (Signed)
Mandy w/ [facility] returned call Leafy Ro reported that the ONO does state on BiPAP on RA >> located ONO and she assisted with locating the information.  Verified test done on BiPAP on RA, highlighted and placed in VS' lookat.  Dr. Halford Chessman please advise what liter flow you would like pt's supplemental O2 on, thank you.

## 2015-04-18 NOTE — Telephone Encounter (Signed)
Called [place] and she will get a download off BIPAP and fax over. Will await fax

## 2015-04-18 NOTE — Telephone Encounter (Signed)
Please get BiPAP download.  Will then determine if she needs supplemental oxygen with BiPAP.

## 2015-04-22 NOTE — Telephone Encounter (Signed)
Called and spoke to [name] at [place]. [name] stated the download should be faxed to [place] tomorrow (10.4.2016). Will hold in triage till download is received.

## 2015-04-23 NOTE — Telephone Encounter (Signed)
As of right now, the download as not been faxed to [place].

## 2015-04-24 NOTE — Telephone Encounter (Signed)
Still not received  Will hold in triage until recieved

## 2015-04-24 NOTE — Telephone Encounter (Signed)
No fax has been received yet. Called [facility] and this will be faxed upfront. Will await fax

## 2015-04-25 NOTE — Telephone Encounter (Signed)
Fax is not in VS' folder up front or in his [place] and re-requested this to be faxed to our office, verified fax #.

## 2015-04-26 NOTE — Telephone Encounter (Signed)
Called and spoke to [place] at [place]. Requested the fax be sent again as it is not in VS look-ats.

## 2015-04-29 NOTE — Telephone Encounter (Signed)
Checking on status of test [phone] mandy wooten.Hillery Hunter

## 2015-04-29 NOTE — Telephone Encounter (Signed)
Download received and placed in VS look at. Please advise thanks

## 2015-04-29 NOTE — Telephone Encounter (Signed)
Talked to [facility] at office and informed that download has been placed in VS folder, but has not been reviewed yet.  Will contact once VS has resulted ONO

## 2015-04-30 NOTE — Telephone Encounter (Signed)
Dr. Halford Chessman, Please advise on ONO results.  Thanks.

## 2015-05-01 NOTE — Telephone Encounter (Signed)
ONO was reviewed on 04/17/15 >> scroll down to review discussion in this phone note.  I was waiting for BiPAP download with results as follows:  BiPAP 02/21/15 to 04/21/15 >> used on 60 of 60 nights with average 1 hrs and 8 min with BiPAP 14/10 cm H2O.   She needs to be schedule for ROV with me or Tammy Parrett to discuss status of sleep disordered breathing therapy.  Will then determine if she needs to use BiPAP on more regular basis, add supplemental oxygen to BiPAP, or just use supplemental oxygen w/o PAP therapy.

## 2015-05-01 NOTE — Telephone Encounter (Signed)
Called and spoke with pt. Reviewed results and recs per VS. Pt is scheduled for ov with TP on 05/05/15 I offered earlier ov but she stated that she would be out of town. Pt voiced understanding and had no further questions.   Called and spoke to [place] at [place] to notify of ov and status of situation.  She voiced understanding and had no further questions Nothing further needed, will sign off on message

## 2015-05-01 NOTE — Telephone Encounter (Signed)
Dr. Halford Chessman, have you received ONO results for this patient? Please advise.

## 2015-05-14 ENCOUNTER — Encounter: Payer: Self-pay | Admitting: Pulmonary Disease

## 2015-05-14 DIAGNOSIS — Z139 Encounter for screening, unspecified: Secondary | ICD-10-CM | POA: Diagnosis not present

## 2015-05-14 DIAGNOSIS — Z0000 Encounter for general adult medical examination without abnormal findings: Secondary | ICD-10-CM | POA: Diagnosis not present

## 2015-05-14 DIAGNOSIS — Z1389 Encounter for screening for other disorder: Secondary | ICD-10-CM | POA: Diagnosis not present

## 2015-05-14 DIAGNOSIS — Z9181 History of falling: Secondary | ICD-10-CM | POA: Diagnosis not present

## 2015-05-15 ENCOUNTER — Encounter: Payer: Self-pay | Admitting: Adult Health

## 2015-05-15 ENCOUNTER — Ambulatory Visit (INDEPENDENT_AMBULATORY_CARE_PROVIDER_SITE_OTHER): Payer: Medicare Other | Admitting: Adult Health

## 2015-05-15 VITALS — BP 134/78 | HR 56 | Temp 97.9°F | Ht 62.0 in | Wt 249.0 lb

## 2015-05-15 DIAGNOSIS — Z23 Encounter for immunization: Secondary | ICD-10-CM

## 2015-05-15 DIAGNOSIS — J9611 Chronic respiratory failure with hypoxia: Secondary | ICD-10-CM | POA: Diagnosis not present

## 2015-05-15 DIAGNOSIS — I6523 Occlusion and stenosis of bilateral carotid arteries: Secondary | ICD-10-CM

## 2015-05-15 DIAGNOSIS — E662 Morbid (severe) obesity with alveolar hypoventilation: Secondary | ICD-10-CM | POA: Diagnosis not present

## 2015-05-15 DIAGNOSIS — G4733 Obstructive sleep apnea (adult) (pediatric): Secondary | ICD-10-CM | POA: Diagnosis not present

## 2015-05-15 NOTE — Patient Instructions (Signed)
Continue on BIPAP At bedtime   Work on weight loss  Wear for at least 6hr each night .  Add Oxygen with BIPAP At bedtime at 2l/m  Set up ONO in 1 month on BIPAP /O2 .  Follow up Dr. Halford Chessman  3-4 months  Flu shot today .

## 2015-05-16 DIAGNOSIS — J9610 Chronic respiratory failure, unspecified whether with hypoxia or hypercapnia: Secondary | ICD-10-CM | POA: Insufficient documentation

## 2015-05-16 NOTE — Progress Notes (Signed)
   Subjective:    Patient ID: [name], female    DOB: [birth year], 69 y.o.   MRN: [id number]  HPI 69 yo female morbidly obese with OHS /OSA on Bilevel device   TEST :  ONO 04/15/15 >> Test time 7 hrs 58 min. Basal SpO2 91%, low SpO2 80%. Spent 68.8 min with SpO2 < 88%.  05/15/15 Follow up : OSA/OHS  Pt returns for 5 months follow up .  She is previous pt of Dr. Gwenette Greet . Has known OHS on BIPAP . Previously on BIPAP with O2 but was taken off of while back. Recently felt that she was have low oxygen while sleeping. ONO was set up . This was done on 9/26 on BIPAP . This showed desats on BIPAP . We discussed starting on Oxygen at 2l/m . She says she wears BIPAP for at least 7 hrs .Download was requested . Says she feels good on BIPAP. She denies chest pain, orthopnea, increased edema.  She does have CHF and is followed by CHF clinic.  Recent echo showed EF 55%, gr 2 DD     Review of Systems Constitutional:   No  weight loss, night sweats,  Fevers, chills, + fatigue, or  lassitude.  HEENT:   No headaches,  Difficulty swallowing,  Tooth/dental problems, or  Sore throat,                No sneezing, itching, ear ache, nasal congestion, post nasal drip,   CV:  No chest pain,  Orthopnea, PND, swelling in lower extremities, anasarca, dizziness, palpitations, syncope.   GI  No heartburn, indigestion, abdominal pain, nausea, vomiting, diarrhea, change in bowel habits, loss of appetite, bloody stools.   Resp:   No excess mucus, no productive cough,  No non-productive cough,  No coughing up of blood.  No change in color of mucus.  No wheezing.  No chest wall deformity  Skin: no rash or lesions.  GU: no dysuria, change in color of urine, no urgency or frequency.  No flank pain, no hematuria   MS:  No joint pain or swelling.  No decreased range of motion.  No back pain.  Psych:  No change in mood or affect. No depression or anxiety.  No memory loss.         Objective:   Physical  Exam GEN: A/Ox3; pleasant , NAD, morbidly obese   HEENT:  [place]/AT,  EACs-clear, TMs-wnl, NOSE-clear, THROAT-clear, no lesions, no postnasal drip or exudate noted.   NECK:  Supple w/ fair ROM; no JVD; normal carotid impulses w/o bruits; no thyromegaly or nodules palpated; no lymphadenopathy.  RESP  Clear  P & A; w/o, wheezes/ rales/ or rhonchi.no accessory muscle use, no dullness to percussion  CARD:  RRR, no m/r/g  , no peripheral edema, pulses intact, no cyanosis or clubbing.  GI:   Soft & nt; nml bowel sounds; no organomegaly or masses detected.  Musco: Warm bil, no deformities or joint swelling noted.  S/;p Bilateral BKA w/ prosthesis   Neuro: alert, no focal deficits noted.    Skin: Warm, no lesions or rashes         Assessment & Plan:

## 2015-05-16 NOTE — Assessment & Plan Note (Signed)
Continue on BIPAP At bedtime   Work on weight loss  Wear for at least 6hr each night .  Add Oxygen with BIPAP At bedtime at 2l/m  Set up ONO in 1 month on BIPAP /O2 .  Follow up Dr. Halford Chessman  3-4 months  Flu shot today .

## 2015-05-16 NOTE — Assessment & Plan Note (Signed)
OHS , recent ONO on BIPAP w/ desats,  Add O2 with BIPAP  Redo ONO on BIPAP to make sure 2l is adequate to keep sats >88%  Plan  Continue on BIPAP At bedtime   Work on weight loss  Wear for at least 6hr each night .  Add Oxygen with BIPAP At bedtime at 2l/m  Set up ONO in 1 month on BIPAP /O2 .  Follow up Dr. Halford Chessman  3-4 months  Flu shot today .

## 2015-05-16 NOTE — Assessment & Plan Note (Signed)
ONO w/ desats on BIPAP   Plan  Add O2 with BIPAP At bedtime   Repeat ONO in 1 month

## 2015-05-20 ENCOUNTER — Encounter: Payer: Self-pay | Admitting: Pulmonary Disease

## 2015-06-03 DIAGNOSIS — I509 Heart failure, unspecified: Secondary | ICD-10-CM | POA: Diagnosis not present

## 2015-06-03 DIAGNOSIS — J9610 Chronic respiratory failure, unspecified whether with hypoxia or hypercapnia: Secondary | ICD-10-CM | POA: Diagnosis not present

## 2015-06-03 DIAGNOSIS — S88912A Complete traumatic amputation of left lower leg, level unspecified, initial encounter: Secondary | ICD-10-CM | POA: Diagnosis not present

## 2015-06-03 DIAGNOSIS — S88911A Complete traumatic amputation of right lower leg, level unspecified, initial encounter: Secondary | ICD-10-CM | POA: Diagnosis not present

## 2015-06-10 DIAGNOSIS — I129 Hypertensive chronic kidney disease with stage 1 through stage 4 chronic kidney disease, or unspecified chronic kidney disease: Secondary | ICD-10-CM | POA: Diagnosis not present

## 2015-06-10 DIAGNOSIS — E1165 Type 2 diabetes mellitus with hyperglycemia: Secondary | ICD-10-CM | POA: Diagnosis not present

## 2015-06-10 DIAGNOSIS — E1151 Type 2 diabetes mellitus with diabetic peripheral angiopathy without gangrene: Secondary | ICD-10-CM | POA: Diagnosis not present

## 2015-06-10 DIAGNOSIS — E782 Mixed hyperlipidemia: Secondary | ICD-10-CM | POA: Diagnosis not present

## 2015-06-10 DIAGNOSIS — L989 Disorder of the skin and subcutaneous tissue, unspecified: Secondary | ICD-10-CM | POA: Diagnosis not present

## 2015-08-08 DIAGNOSIS — H3412 Central retinal artery occlusion, left eye: Secondary | ICD-10-CM | POA: Diagnosis not present

## 2015-08-11 ENCOUNTER — Emergency Department (HOSPITAL_COMMUNITY): Payer: Medicare Other

## 2015-08-11 ENCOUNTER — Encounter (HOSPITAL_COMMUNITY): Payer: Self-pay | Admitting: Emergency Medicine

## 2015-08-11 ENCOUNTER — Emergency Department (HOSPITAL_COMMUNITY)
Admission: EM | Admit: 2015-08-11 | Discharge: 2015-08-11 | Disposition: A | Discharge status: Home / Self Care | Payer: Medicare Other | Attending: Emergency Medicine | Admitting: Emergency Medicine

## 2015-08-11 DIAGNOSIS — Z794 Long term (current) use of insulin: Secondary | ICD-10-CM | POA: Diagnosis not present

## 2015-08-11 DIAGNOSIS — R7989 Other specified abnormal findings of blood chemistry: Secondary | ICD-10-CM | POA: Insufficient documentation

## 2015-08-11 DIAGNOSIS — I10 Essential (primary) hypertension: Secondary | ICD-10-CM | POA: Insufficient documentation

## 2015-08-11 DIAGNOSIS — Z79899 Other long term (current) drug therapy: Secondary | ICD-10-CM | POA: Diagnosis not present

## 2015-08-11 DIAGNOSIS — Z8669 Personal history of other diseases of the nervous system and sense organs: Secondary | ICD-10-CM | POA: Insufficient documentation

## 2015-08-11 DIAGNOSIS — M546 Pain in thoracic spine: Secondary | ICD-10-CM | POA: Insufficient documentation

## 2015-08-11 DIAGNOSIS — F419 Anxiety disorder, unspecified: Secondary | ICD-10-CM | POA: Insufficient documentation

## 2015-08-11 DIAGNOSIS — Z87828 Personal history of other (healed) physical injury and trauma: Secondary | ICD-10-CM | POA: Insufficient documentation

## 2015-08-11 DIAGNOSIS — Z7982 Long term (current) use of aspirin: Secondary | ICD-10-CM | POA: Diagnosis not present

## 2015-08-11 DIAGNOSIS — M542 Cervicalgia: Secondary | ICD-10-CM | POA: Insufficient documentation

## 2015-08-11 DIAGNOSIS — E119 Type 2 diabetes mellitus without complications: Secondary | ICD-10-CM | POA: Insufficient documentation

## 2015-08-11 DIAGNOSIS — I2510 Atherosclerotic heart disease of native coronary artery without angina pectoris: Secondary | ICD-10-CM | POA: Insufficient documentation

## 2015-08-11 DIAGNOSIS — M549 Dorsalgia, unspecified: Secondary | ICD-10-CM | POA: Diagnosis not present

## 2015-08-11 DIAGNOSIS — R51 Headache: Secondary | ICD-10-CM | POA: Diagnosis not present

## 2015-08-11 DIAGNOSIS — M1990 Unspecified osteoarthritis, unspecified site: Secondary | ICD-10-CM | POA: Diagnosis not present

## 2015-08-11 DIAGNOSIS — Z85038 Personal history of other malignant neoplasm of large intestine: Secondary | ICD-10-CM | POA: Diagnosis not present

## 2015-08-11 LAB — I-STAT CHEM 8, ED
BUN: 50 mg/dL — ABNORMAL HIGH (ref 6–20)
CREATININE: 1.8 mg/dL — AB (ref 0.44–1.00)
Calcium, Ion: 1.16 mmol/L (ref 1.13–1.30)
Chloride: 106 mmol/L (ref 101–111)
GLUCOSE: 108 mg/dL — AB (ref 65–99)
HEMATOCRIT: 31 % — AB (ref 36.0–46.0)
HEMOGLOBIN: 10.5 g/dL — AB (ref 12.0–15.0)
Potassium: 3.8 mmol/L (ref 3.5–5.1)
Sodium: 143 mmol/L (ref 135–145)
TCO2: 24 mmol/L (ref 0–100)

## 2015-08-11 LAB — CBG MONITORING, ED: Glucose-Capillary: 87 mg/dL (ref 65–99)

## 2015-08-11 MED ORDER — SODIUM CHLORIDE 0.9 % IV BOLUS (SEPSIS)
500.0000 mL | Freq: Once | INTRAVENOUS | Status: AC
  Administered 2015-08-11: 500 mL via INTRAVENOUS

## 2015-08-11 MED ORDER — HYDROCODONE-ACETAMINOPHEN 5-325 MG PO TABS: 1.0000 | ORAL_TABLET | Freq: Four times a day (QID) | ORAL | Status: DC | PRN

## 2015-08-11 MED ORDER — FENTANYL CITRATE (PF) 100 MCG/2ML IJ SOLN
50.0000 ug | Freq: Once | INTRAMUSCULAR | Status: AC
  Administered 2015-08-11: 50 ug via INTRAVENOUS
  Filled 2015-08-11: qty 2

## 2015-08-11 NOTE — ED Notes (Signed)
Per EMS, pt. From home with complaint of acute neck and back pain at  10/10 which started last night . Pt. Denied fall . Denies numbness , no SOB. Pt. Has bil . BKA., wheelchair bound.

## 2015-08-11 NOTE — ED Notes (Signed)
Patient transported to CT 

## 2015-08-11 NOTE — ED Provider Notes (Signed)
CSN: [id number]     Arrival date & time 08/11/15  [id number] History   First MD Initiated Contact with Patient 08/11/15 [phone]     Chief Complaint  Patient presents with  . Neck Pain  . Back Pain     (Consider location/radiation/quality/duration/timing/severity/associated sxs/prior Treatment) HPI  70 year old female presents with acute neck pain. Pain started around 2 days ago in her mid thoracic back but now is focally in her posterior neck. Significantly worse last night. Back pain is now gone. Does not remember any injury. Pain has been also radiating down both of her arms. No weakness or numbness in her arms. Moving her arms makes the pain worse. Has not noticed any pain in her legs. No fevers. Tried Tylenol with some partial relief last night. Patient also notes that she has left blurry vision for the past 3 weeks. Saw an ophthalmologist who states it could be a retinal issue or she could've had a mini stroke. She has been having an associated mild headache during this time. Headache has not worsened or improved over these last 3 weeks. No dizziness. No chest pain or shortness of breath.  Past Medical History  Diagnosis Date  . OSA (obstructive sleep apnea)   . Thrombophlebitis of deep vein of leg ([place])   . DM (diabetes mellitus) ([place])   . CHF (congestive heart failure) (HCC)     hx of EF 15-20% in 4/08,  echo 3/09 EF 60-65%;   echo 6/12: EF 55-60%, mild LAE  . Carotid stenosis     u/s 6/10: R 0-39%Ll 40-59%;  u/s 7/12: 40-59% bilateral (repeat in 7/13)  . HTN (hypertension)   . Morbid obesity ([place])   . Pulmonary HTN ([place])     mild,  cath 6/08 with PVR 5.1  . Hypokalemia   . Osteomyelitis of toe (HCC)     s/p partial resection of 1st R toe  . Spinal stenosis   . Obesity hypoventilation syndrome ([place])   . Cancer ([place])     hx of colon CA 2000  . Arthritis   . Complication of anesthesia     Oxygen drops   . PONV (postoperative nausea and vomiting)     after colon surgery 2000  . Head  injury, acute, without loss of consciousness   . Anxiety   . History of blood transfusion   . Family history of anesthesia complication     Mother N/V   Past Surgical History  Procedure Laterality Date  . Carpal tunnel release Left 07/2007  . Colon cancer with resection  2000  . Cholecystectomy  2002  . Vaginal hysterectomy  1983  . Eye surgery Bilateral     bilateral cataracts   . Amputation Left 10/07/2012    Procedure: AMPUTATION RAY;  Surgeon: Newt Minion, MD;  Location: [facility];  Service: Orthopedics;  Laterality: Left;  Left foot 1st Ray Amputation  . Amputation Left 06/18/2013    Procedure: AMPUTATION BELOW KNEE;  Surgeon: Newt Minion, MD;  Location: [facility];  Service: Orthopedics;  Laterality: Left;  . Colonoscopy    . Amputation Right 05/04/2014    Procedure: Right Below Knee Amputation;  Surgeon: Newt Minion, MD;  Location: [facility];  Service: Orthopedics;  Laterality: Right;   Family History  Problem Relation Age of Onset  . Coronary artery disease Father    Social History  Substance Use Topics  . Smoking status: Never Smoker   . Smokeless tobacco: Never Used  .  Alcohol Use: No   OB History    No data available     Review of Systems  Constitutional: Negative for fever.  Respiratory: Negative for shortness of breath.   Cardiovascular: Negative for chest pain.  Gastrointestinal: Negative for vomiting.  Musculoskeletal: Positive for neck pain.  Neurological: Positive for headaches. Negative for dizziness, weakness and numbness.  All other systems reviewed and are negative.     Allergies  Aldactone; Bactrim; Codeine; Meperidine hcl; and Metformin  Home Medications   Prior to Admission medications   Medication Sig Start Date End Date Taking? Authorizing Provider  acetaminophen (TYLENOL) 500 MG tablet Take 500 mg by mouth every 6 (six) hours as needed.    Historical Provider, MD  allopurinol (ZYLOPRIM) 300 MG tablet Take 300 mg by mouth daily.    Historical  Provider, MD  amLODipine (NORVASC) 5 MG tablet Take 5 mg by mouth daily.    Historical Provider, MD  aspirin (BAYER ASPIRIN) 325 MG tablet Take 325 mg by mouth daily.      Historical Provider, MD  carvedilol (COREG) 3.125 MG tablet Take 3.125 mg by mouth 2 (two) times daily with a meal.     Historical Provider, MD  docusate sodium (COLACE) 100 MG capsule Take 100 mg by mouth 2 (two) times daily.    Historical Provider, MD  ferrous sulfate 325 (65 FE) MG tablet Take 325 mg by mouth daily with breakfast.    Historical Provider, MD  LEVEMIR 100 UNIT/ML injection Use as directed 12/01/14   Historical Provider, MD  NOVOLOG 100 UNIT/ML injection As directed 02/26/15   Historical Provider, MD  potassium chloride SA (K-DUR,KLOR-CON) 20 MEQ tablet Take 20 mEq by mouth daily. 01/11/13   Amy D Ninfa Meeker, NP  pravastatin (PRAVACHOL) 40 MG tablet Take 1 tablet by mouth daily.    Historical Provider, MD  sertraline (ZOLOFT) 100 MG tablet Take 50 mg by mouth daily.     Historical Provider, MD  torsemide (DEMADEX) 20 MG tablet Take 60 mg by mouth daily.    Historical Provider, MD  Vitamin D, Ergocalciferol, (DRISDOL) 50000 UNITS CAPS Take 50,000 Units by mouth 2 (two) times a week. Take on Mon and Thurs.    Historical Provider, MD   BP 171/57 mmHg  Pulse 79  Temp(Src) 97.6 F (36.4 C) (Oral)  Resp 26  SpO2 93% Physical Exam  Constitutional: She is oriented to person, place, and time. She appears well-developed and well-nourished.  HENT:  Head: Normocephalic and atraumatic.  Right Ear: External ear normal.  Left Ear: External ear normal.  Nose: Nose normal.  Eyes: EOM are normal. Pupils are equal, round, and reactive to light. Right eye exhibits no discharge. Left eye exhibits no discharge.  Neck: Neck supple. Spinous process tenderness and muscular tenderness present. No rigidity.    Diffuse muscular tenderness over mid and lateral neck. Pain with ROM but no stiffness  Cardiovascular: Normal rate, regular  rhythm and normal heart sounds.   Pulmonary/Chest: Effort normal and breath sounds normal.  Abdominal: Soft. There is no tenderness.  Musculoskeletal:       Thoracic back: She exhibits no tenderness.       Lumbar back: She exhibits no tenderness.  Bilateral BKA with prosthesis  Neurological: She is alert and oriented to person, place, and time.  CN 2-12 grossly intact. 5/5 strength in all 4 extremities. Grossly normal sensation  Skin: Skin is warm and dry.  Nursing note and vitals reviewed.   ED Course  Procedures (including critical care time) Labs Review Labs Reviewed  I-STAT CHEM 8, ED - Abnormal; Notable for the following:    BUN 50 (*)    Creatinine, Ser 1.80 (*)    Glucose, Bld 108 (*)    Hemoglobin 10.5 (*)    HCT 31.0 (*)    All other components within normal limits  CBG MONITORING, ED    Imaging Review Ct Head Wo Contrast  08/11/2015  CLINICAL DATA:  Wheel chair bound patient complains of severe neck and back pain. EXAM: CT HEAD WITHOUT CONTRAST CT CERVICAL SPINE WITHOUT CONTRAST TECHNIQUE: Multidetector CT imaging of the head and cervical spine was performed following the standard protocol without intravenous contrast. Multiplanar CT image reconstructions of the cervical spine were also generated. COMPARISON:  None. FINDINGS: CT HEAD FINDINGS No mass effect or midline shift. No evidence of acute intracranial hemorrhage, or infarction. No abnormal extra-axial fluid collections. Gray-white matter differentiation is normal. Basal cisterns are preserved. Advanced calcified atherosclerotic disease of the vertebral arteries at the skullbase is noted. No depressed skull fractures. Visualized paranasal sinuses and mastoid air cells are not opacified. CT CERVICAL SPINE FINDINGS There is no evidence for acute fracture. There is a reversal of the expected cervical lordosis centered at C5-C6, likely due to advanced degenerative changes. There is multilevel degenerative disc disease  involving all the levels of the cervical spine. The disc space narrowing is worse at C5-C6 and C6-C7 with likely degenerative osteolytic endplate changes, vertebral body remodeling and formation of large anterior and posterior near bridging osteophytes. There is an associated posterior facet arthropathy, somewhat less severe. Ossifications of the anterior longitudinal ligament, posterior longitudinal ligament and interspinous ligaments are noted in the upper cervical spine. Prevertebral soft tissues have a normal appearance. Lung apices have a normal appearance. IMPRESSION: No acute intracranial abnormality. No evidence of acute fracture of the cervical spine. Advanced multilevel degenerative disc disease, worse at C5-C6 and C6-C7, with reversal of cervical lordosis centered at C5-C6. Advanced endplate osteolytic changes are seen at these levels. A disc centered process such as discitis (in the right clinical setting) may cause a similar appearance, although it is felt less likely. Electronically Signed   By: Fidela Salisbury M.D.   On: 08/11/2015 09:18   Ct Cervical Spine Wo Contrast  08/11/2015  CLINICAL DATA:  Wheel chair bound patient complains of severe neck and back pain. EXAM: CT HEAD WITHOUT CONTRAST CT CERVICAL SPINE WITHOUT CONTRAST TECHNIQUE: Multidetector CT imaging of the head and cervical spine was performed following the standard protocol without intravenous contrast. Multiplanar CT image reconstructions of the cervical spine were also generated. COMPARISON:  None. FINDINGS: CT HEAD FINDINGS No mass effect or midline shift. No evidence of acute intracranial hemorrhage, or infarction. No abnormal extra-axial fluid collections. Gray-white matter differentiation is normal. Basal cisterns are preserved. Advanced calcified atherosclerotic disease of the vertebral arteries at the skullbase is noted. No depressed skull fractures. Visualized paranasal sinuses and mastoid air cells are not opacified. CT  CERVICAL SPINE FINDINGS There is no evidence for acute fracture. There is a reversal of the expected cervical lordosis centered at C5-C6, likely due to advanced degenerative changes. There is multilevel degenerative disc disease involving all the levels of the cervical spine. The disc space narrowing is worse at C5-C6 and C6-C7 with likely degenerative osteolytic endplate changes, vertebral body remodeling and formation of large anterior and posterior near bridging osteophytes. There is an associated posterior facet arthropathy, somewhat less severe. Ossifications of the anterior longitudinal  ligament, posterior longitudinal ligament and interspinous ligaments are noted in the upper cervical spine. Prevertebral soft tissues have a normal appearance. Lung apices have a normal appearance. IMPRESSION: No acute intracranial abnormality. No evidence of acute fracture of the cervical spine. Advanced multilevel degenerative disc disease, worse at C5-C6 and C6-C7, with reversal of cervical lordosis centered at C5-C6. Advanced endplate osteolytic changes are seen at these levels. A disc centered process such as discitis (in the right clinical setting) may cause a similar appearance, although it is felt less likely. Electronically Signed   By: Fidela Salisbury M.D.   On: 08/11/2015 09:18   Mr Cervical Spine Wo Contrast  08/11/2015  CLINICAL DATA:  Acute neck pain since last evening. No known fall or trauma. EXAM: MRI CERVICAL SPINE WITHOUT CONTRAST TECHNIQUE: Multiplanar, multisequence MR imaging of the cervical spine was performed. No intravenous contrast was administered. COMPARISON:  CT scan 08/11/2015 FINDINGS: Examination is quite limited due to patient motion. Degenerative cervical spondylosis with multilevel disc disease and facet disease most notable at C5-6 and C6-7. Mild reversal of the normal cervical lordosis. No acute bony findings. No paraspinal soft tissue swelling, edema or mass. The facets are normally  aligned. Moderate facet disease. The cervical spinal cord is grossly normal but there is significant artifact and cord abnormality could easily be missed. C2-3:  No significant findings. C3-4:  No significant findings. C4-5:  No significant findings. C5-6: Degenerative disc disease and facet disease. There is a bulging annulus and osteophytic ridging with mass effect on the ventral thecal sac and narrowing of the ventral CSF space. Suspect left-sided foraminal stenosis. C6-7: Degenerative disc disease with a bulging annulus and osteophytic ridging. Mild flattening of the ventral thecal sac and narrowing of the ventral CSF space. No obvious foraminal stenosis. C7-T1: Bulging annulus with mild mass effect on both sides of the thecal sac. Possible mild left foraminal stenosis. IMPRESSION: Very limited examination due to patient motion. Advanced degenerative disc disease at C5-6 and C6-7. No obvious acute bony abnormality or cord abnormality. Electronically Signed   By: Marijo Sanes M.D.   On: 08/11/2015 12:11   I have personally reviewed and evaluated these images and lab results as part of my medical decision-making.   EKG Interpretation   Date/Time:  Sunday August 11 2015 06:54:22 EST Ventricular Rate:  78 PR Interval:  216 QRS Duration: 124 QT Interval:  434 QTC Calculation: 494 R Axis:   -110 Text Interpretation:  Sinus rhythm Borderline prolonged PR interval  Nonspecific IVCD with LAD Probable anterolateral infarct, old Baseline  wander in lead(s) V3 V4 no significant change since Oct 2015 Confirmed by  Regenia Skeeter  MD, Trigg Delarocha ([id number]) on 08/11/2015 7:10:14 AM      MDM   Final diagnoses:  Posterior neck pain  Elevated serum creatinine    Patient's neck pain is most likely related to the severe arthritis seen on CT. There was a question of possible discitis and that CT alone would not be able to rule this out. Given that the arthritis is likely not new but her pain is, an MRI was obtained to  rule out discitis. MRI is not optimal given some patient motion but no obvious spinal cord impingement or infection. Unclear why she is having her headache for 3 weeks. She is to follow up with her ophthalmologist tomorrow anyway. Highly doubt stroke. She will follow-up with her PCP in 2 days as scheduled as well. After discussion with patient, she wants to try some hydrocodone at  home for better pain control. Her creatinine is mildly elevated from 2015. Unclear if this is new or not. She does not appear acutely dehydrated. She did briefly become short of breath right before discharge. She is not chronically wear oxygen but has some at home. Her lungs do not show any obvious rales or wheezes. I offered a chest x-ray the patient declines a just wants to go home and rest. She does not appear in acute distress. With 2 L oxygen her sats are above 90%. She was to go home, caution on strict return precautions and recommend close outpatient follow-up.    Sherwood Gambler, MD 08/11/15 [phone]

## 2015-08-11 NOTE — ED Notes (Signed)
Patient transported to MRI 

## 2015-08-11 NOTE — ED Notes (Signed)
She had felt as if possibly her blood sugar was low, however, cbg confirmed normal blood sugar level and we will proceed with d/c.

## 2015-08-12 DIAGNOSIS — E113491 Type 2 diabetes mellitus with severe nonproliferative diabetic retinopathy without macular edema, right eye: Secondary | ICD-10-CM | POA: Diagnosis not present

## 2015-08-12 DIAGNOSIS — E113591 Type 2 diabetes mellitus with proliferative diabetic retinopathy without macular edema, right eye: Secondary | ICD-10-CM | POA: Diagnosis not present

## 2015-08-12 DIAGNOSIS — H4312 Vitreous hemorrhage, left eye: Secondary | ICD-10-CM | POA: Diagnosis not present

## 2015-08-12 DIAGNOSIS — H35043 Retinal micro-aneurysms, unspecified, bilateral: Secondary | ICD-10-CM | POA: Diagnosis not present

## 2015-08-13 DIAGNOSIS — E1151 Type 2 diabetes mellitus with diabetic peripheral angiopathy without gangrene: Secondary | ICD-10-CM | POA: Diagnosis not present

## 2015-08-13 DIAGNOSIS — E785 Hyperlipidemia, unspecified: Secondary | ICD-10-CM | POA: Diagnosis not present

## 2015-08-13 DIAGNOSIS — S88911A Complete traumatic amputation of right lower leg, level unspecified, initial encounter: Secondary | ICD-10-CM | POA: Diagnosis not present

## 2015-08-13 DIAGNOSIS — S88912A Complete traumatic amputation of left lower leg, level unspecified, initial encounter: Secondary | ICD-10-CM | POA: Diagnosis not present

## 2015-08-13 DIAGNOSIS — E559 Vitamin D deficiency, unspecified: Secondary | ICD-10-CM | POA: Diagnosis not present

## 2015-08-13 DIAGNOSIS — E1165 Type 2 diabetes mellitus with hyperglycemia: Secondary | ICD-10-CM | POA: Diagnosis not present

## 2015-08-26 DIAGNOSIS — H547 Unspecified visual loss: Secondary | ICD-10-CM | POA: Diagnosis not present

## 2015-08-26 DIAGNOSIS — E113592 Type 2 diabetes mellitus with proliferative diabetic retinopathy without macular edema, left eye: Secondary | ICD-10-CM | POA: Diagnosis not present

## 2015-08-26 DIAGNOSIS — H4312 Vitreous hemorrhage, left eye: Secondary | ICD-10-CM | POA: Diagnosis not present

## 2015-09-04 ENCOUNTER — Ambulatory Visit (INDEPENDENT_AMBULATORY_CARE_PROVIDER_SITE_OTHER): Payer: Medicare Other | Admitting: Pulmonary Disease

## 2015-09-04 ENCOUNTER — Encounter: Payer: Self-pay | Admitting: Pulmonary Disease

## 2015-09-04 VITALS — BP 128/76 | HR 73 | Wt 235.0 lb

## 2015-09-04 DIAGNOSIS — G4733 Obstructive sleep apnea (adult) (pediatric): Secondary | ICD-10-CM | POA: Diagnosis not present

## 2015-09-04 NOTE — Progress Notes (Signed)
Current Outpatient Prescriptions on File Prior to Visit  Medication Sig  . acetaminophen (TYLENOL) 500 MG tablet Take 500 mg by mouth every 6 (six) hours as needed for mild pain.   Marland Kitchen allopurinol (ZYLOPRIM) 300 MG tablet Take 300 mg by mouth daily.  Marland Kitchen amLODipine (NORVASC) 5 MG tablet Take 5 mg by mouth at bedtime.   Marland Kitchen aspirin (BAYER ASPIRIN) 325 MG tablet Take 325 mg by mouth daily.    . carvedilol (COREG) 3.125 MG tablet Take 3.125 mg by mouth 2 (two) times daily with a meal.   . docusate sodium (COLACE) 100 MG capsule Take 100 mg by mouth 2 (two) times daily.  . ferrous sulfate 325 (65 FE) MG tablet Take 325 mg by mouth daily with breakfast.  . HYDROcodone-acetaminophen (NORCO) 5-325 MG tablet Take 1 tablet by mouth every 6 (six) hours as needed for severe pain.  Marland Kitchen insulin lispro (HUMALOG) 100 UNIT/ML injection Inject 2-4 Units into the skin 3 (three) times daily before meals. Sliding Scale   150-200 2 units , add an additional 2 units for each 50 increment increase in blood sugar  . LEVEMIR 100 UNIT/ML injection Inject 50-55 Units into the skin 2 (two) times daily. 55 units in the morning and 50 units at bedtime  as directed  . potassium chloride SA (K-DUR,KLOR-CON) 20 MEQ tablet Take 20 mEq by mouth daily.  . pravastatin (PRAVACHOL) 40 MG tablet Take 1 tablet by mouth daily.  . sertraline (ZOLOFT) 50 MG tablet Take 50 mg by mouth daily. In the evening  . torsemide (DEMADEX) 20 MG tablet Take 60 mg by mouth every morning.   . Vitamin D, Ergocalciferol, (DRISDOL) 50000 UNITS CAPS Take 50,000 Units by mouth 2 (two) times a week. Take on Mon and Thurs.   No current facility-administered medications on file prior to visit.     Chief Complaint  Patient presents with  . Follow-up    former [facility] pt here for OSA follow up - sleep is doing well, wearing BiPAP >8hrs per night, mask fits well.  reports she does not have a chip in her machine, requesting a new mask     Tests PSG 06/30/16 >> AHI  118.7 Echo 03/29/15 >> EF 55 to 123456, grade 2 diastolic dysfx  Past medical hx DVT, DM, CHF, HTN, Spinal stenosis, Colon cancer 2000, Anxiety  Past surgical hx, Allergies, Family hx, Social hx all reviewed.  Vital Signs BP 128/76 mmHg  Pulse 73  Wt 235 lb (106.595 kg)  SpO2 90%  History of Present Illness [name] is a 70 y.o. female with OSA/OHS.  She was previously followed by Dr. Gwenette Greet.  She uses BiPAP and 2 liters oxygen nightly.  She is not sure how old her machine is.  She has not gotten new mask since last summer.  She sleeps well, and feels rested.    Physical Exam  General - No distress, in wheelchair ENT - No sinus tenderness, no oral exudate, no LAN Cardiac - s1s2 regular, no murmur Chest - No wheeze/rales/dullness Back - No focal tenderness Abd - Soft, non-tender Ext - b/l BKA Neuro - Normal strength Skin - No rashes Psych - normal mood, and behavior   Assessment/Plan  Obstructive sleep apnea. Plan: - will get copy of her BiPAP download - explained that she could be eligible for new machine if her current device is more than 71 yrs old  Obesity hypoventilation syndrome. Plan: - continue 2 liters oxygen at night  Patient Instructions  Will get copy of BiPAP report  Will arrange for new BiPAP supplies  Follow up in 1 year     Chesley Mires, MD [place] Pager:  [phone]

## 2015-09-04 NOTE — Patient Instructions (Signed)
Will get copy of BiPAP report  Will arrange for new BiPAP supplies  Follow up in 1 year

## 2015-09-12 DIAGNOSIS — E1142 Type 2 diabetes mellitus with diabetic polyneuropathy: Secondary | ICD-10-CM | POA: Diagnosis not present

## 2015-09-12 DIAGNOSIS — R8290 Unspecified abnormal findings in urine: Secondary | ICD-10-CM | POA: Diagnosis not present

## 2015-09-12 DIAGNOSIS — Z89511 Acquired absence of right leg below knee: Secondary | ICD-10-CM | POA: Diagnosis not present

## 2015-09-12 DIAGNOSIS — L97221 Non-pressure chronic ulcer of left calf limited to breakdown of skin: Secondary | ICD-10-CM | POA: Diagnosis not present

## 2015-09-12 DIAGNOSIS — Z89512 Acquired absence of left leg below knee: Secondary | ICD-10-CM | POA: Diagnosis not present

## 2015-10-10 DIAGNOSIS — E1142 Type 2 diabetes mellitus with diabetic polyneuropathy: Secondary | ICD-10-CM | POA: Diagnosis not present

## 2015-10-10 DIAGNOSIS — Z89511 Acquired absence of right leg below knee: Secondary | ICD-10-CM | POA: Diagnosis not present

## 2015-10-10 DIAGNOSIS — Z89512 Acquired absence of left leg below knee: Secondary | ICD-10-CM | POA: Diagnosis not present

## 2015-11-07 DIAGNOSIS — L97221 Non-pressure chronic ulcer of left calf limited to breakdown of skin: Secondary | ICD-10-CM | POA: Diagnosis not present

## 2015-11-07 DIAGNOSIS — E1142 Type 2 diabetes mellitus with diabetic polyneuropathy: Secondary | ICD-10-CM | POA: Diagnosis not present

## 2015-11-15 DIAGNOSIS — E162 Hypoglycemia, unspecified: Secondary | ICD-10-CM | POA: Diagnosis not present

## 2015-11-15 DIAGNOSIS — Z1389 Encounter for screening for other disorder: Secondary | ICD-10-CM | POA: Diagnosis not present

## 2015-11-15 DIAGNOSIS — R0602 Shortness of breath: Secondary | ICD-10-CM | POA: Diagnosis not present

## 2015-11-15 DIAGNOSIS — R0989 Other specified symptoms and signs involving the circulatory and respiratory systems: Secondary | ICD-10-CM | POA: Diagnosis not present

## 2015-11-15 DIAGNOSIS — R5383 Other fatigue: Secondary | ICD-10-CM | POA: Diagnosis not present

## 2015-11-15 DIAGNOSIS — I517 Cardiomegaly: Secondary | ICD-10-CM | POA: Diagnosis not present

## 2015-11-15 DIAGNOSIS — I700 Atherosclerosis of aorta: Secondary | ICD-10-CM | POA: Diagnosis not present

## 2015-11-15 DIAGNOSIS — R05 Cough: Secondary | ICD-10-CM | POA: Diagnosis not present

## 2015-11-19 DIAGNOSIS — R630 Anorexia: Secondary | ICD-10-CM | POA: Diagnosis not present

## 2015-11-27 DIAGNOSIS — H35043 Retinal micro-aneurysms, unspecified, bilateral: Secondary | ICD-10-CM | POA: Diagnosis not present

## 2015-11-27 DIAGNOSIS — H31009 Unspecified chorioretinal scars, unspecified eye: Secondary | ICD-10-CM | POA: Diagnosis not present

## 2015-11-27 DIAGNOSIS — H1013 Acute atopic conjunctivitis, bilateral: Secondary | ICD-10-CM | POA: Diagnosis not present

## 2015-11-27 DIAGNOSIS — E113552 Type 2 diabetes mellitus with stable proliferative diabetic retinopathy, left eye: Secondary | ICD-10-CM | POA: Diagnosis not present

## 2016-01-28 DIAGNOSIS — R42 Dizziness and giddiness: Secondary | ICD-10-CM | POA: Diagnosis not present

## 2016-01-28 DIAGNOSIS — R112 Nausea with vomiting, unspecified: Secondary | ICD-10-CM | POA: Diagnosis not present

## 2016-01-28 DIAGNOSIS — E119 Type 2 diabetes mellitus without complications: Secondary | ICD-10-CM | POA: Diagnosis not present

## 2016-02-14 DIAGNOSIS — Z89512 Acquired absence of left leg below knee: Secondary | ICD-10-CM | POA: Diagnosis not present

## 2016-02-14 DIAGNOSIS — E1142 Type 2 diabetes mellitus with diabetic polyneuropathy: Secondary | ICD-10-CM | POA: Diagnosis not present

## 2016-02-14 DIAGNOSIS — Z89511 Acquired absence of right leg below knee: Secondary | ICD-10-CM | POA: Diagnosis not present

## 2016-03-17 DIAGNOSIS — D241 Benign neoplasm of right breast: Secondary | ICD-10-CM | POA: Diagnosis not present

## 2016-03-18 ENCOUNTER — Other Ambulatory Visit: Payer: Self-pay | Admitting: General Surgery

## 2016-03-18 DIAGNOSIS — D241 Benign neoplasm of right breast: Secondary | ICD-10-CM

## 2016-03-26 ENCOUNTER — Ambulatory Visit
Admission: RE | Admit: 2016-03-26 | Discharge: 2016-03-26 | Disposition: A | Discharge status: Home / Self Care | Payer: Medicare Other | Source: Ambulatory Visit | Attending: General Surgery | Admitting: General Surgery

## 2016-03-26 ENCOUNTER — Other Ambulatory Visit: Payer: Self-pay | Admitting: General Surgery

## 2016-03-26 DIAGNOSIS — N63 Unspecified lump in breast: Secondary | ICD-10-CM | POA: Diagnosis not present

## 2016-03-26 DIAGNOSIS — D241 Benign neoplasm of right breast: Secondary | ICD-10-CM

## 2016-04-02 DIAGNOSIS — E1165 Type 2 diabetes mellitus with hyperglycemia: Secondary | ICD-10-CM | POA: Diagnosis not present

## 2016-04-02 DIAGNOSIS — E1151 Type 2 diabetes mellitus with diabetic peripheral angiopathy without gangrene: Secondary | ICD-10-CM | POA: Diagnosis not present

## 2016-04-02 DIAGNOSIS — I129 Hypertensive chronic kidney disease with stage 1 through stage 4 chronic kidney disease, or unspecified chronic kidney disease: Secondary | ICD-10-CM | POA: Diagnosis not present

## 2016-04-02 DIAGNOSIS — N183 Chronic kidney disease, stage 3 (moderate): Secondary | ICD-10-CM | POA: Diagnosis not present

## 2016-04-02 DIAGNOSIS — E119 Type 2 diabetes mellitus without complications: Secondary | ICD-10-CM | POA: Diagnosis not present

## 2016-04-15 DIAGNOSIS — E1165 Type 2 diabetes mellitus with hyperglycemia: Secondary | ICD-10-CM | POA: Diagnosis not present

## 2016-04-15 DIAGNOSIS — E1151 Type 2 diabetes mellitus with diabetic peripheral angiopathy without gangrene: Secondary | ICD-10-CM | POA: Diagnosis not present

## 2016-04-23 DIAGNOSIS — E1121 Type 2 diabetes mellitus with diabetic nephropathy: Secondary | ICD-10-CM | POA: Diagnosis not present

## 2016-04-23 DIAGNOSIS — Z23 Encounter for immunization: Secondary | ICD-10-CM | POA: Diagnosis not present

## 2016-04-23 DIAGNOSIS — N390 Urinary tract infection, site not specified: Secondary | ICD-10-CM | POA: Diagnosis not present

## 2016-04-23 DIAGNOSIS — R300 Dysuria: Secondary | ICD-10-CM | POA: Diagnosis not present

## 2016-04-24 DIAGNOSIS — R300 Dysuria: Secondary | ICD-10-CM | POA: Diagnosis not present

## 2016-04-30 DIAGNOSIS — N390 Urinary tract infection, site not specified: Secondary | ICD-10-CM | POA: Diagnosis not present

## 2016-05-08 ENCOUNTER — Ambulatory Visit (INDEPENDENT_AMBULATORY_CARE_PROVIDER_SITE_OTHER): Payer: Self-pay | Admitting: Orthopedic Surgery

## 2016-05-11 ENCOUNTER — Ambulatory Visit (INDEPENDENT_AMBULATORY_CARE_PROVIDER_SITE_OTHER): Payer: Self-pay | Admitting: Orthopedic Surgery

## 2016-05-12 ENCOUNTER — Ambulatory Visit (INDEPENDENT_AMBULATORY_CARE_PROVIDER_SITE_OTHER): Payer: Medicare Other | Admitting: Pulmonary Disease

## 2016-05-12 ENCOUNTER — Encounter: Payer: Self-pay | Admitting: Pulmonary Disease

## 2016-05-12 VITALS — BP 122/88 | HR 64 | Ht 62.5 in | Wt 225.0 lb

## 2016-05-12 DIAGNOSIS — E662 Morbid (severe) obesity with alveolar hypoventilation: Secondary | ICD-10-CM | POA: Diagnosis not present

## 2016-05-12 DIAGNOSIS — J9611 Chronic respiratory failure with hypoxia: Secondary | ICD-10-CM | POA: Diagnosis not present

## 2016-05-12 DIAGNOSIS — Z6841 Body Mass Index (BMI) 40.0 and over, adult: Secondary | ICD-10-CM

## 2016-05-12 DIAGNOSIS — G4733 Obstructive sleep apnea (adult) (pediatric): Secondary | ICD-10-CM | POA: Diagnosis not present

## 2016-05-12 NOTE — Progress Notes (Addendum)
Current Outpatient Prescriptions on File Prior to Visit  Medication Sig  . acetaminophen (TYLENOL) 500 MG tablet Take 500 mg by mouth every 6 (six) hours as needed for mild pain.   Marland Kitchen allopurinol (ZYLOPRIM) 300 MG tablet Take 300 mg by mouth daily.  Marland Kitchen amLODipine (NORVASC) 5 MG tablet Take 5 mg by mouth at bedtime.   Marland Kitchen aspirin (BAYER ASPIRIN) 325 MG tablet Take 325 mg by mouth daily.    . carvedilol (COREG) 3.125 MG tablet Take 3.125 mg by mouth 2 (two) times daily with a meal.   . docusate sodium (COLACE) 100 MG capsule Take 100 mg by mouth 2 (two) times daily.  . ferrous sulfate 325 (65 FE) MG tablet Take 325 mg by mouth daily with breakfast.  . HYDROcodone-acetaminophen (NORCO) 5-325 MG tablet Take 1 tablet by mouth every 6 (six) hours as needed for severe pain.  Marland Kitchen insulin lispro (HUMALOG) 100 UNIT/ML injection Inject 2-4 Units into the skin 3 (three) times daily before meals. Sliding Scale   150-200 2 units , add an additional 2 units for each 50 increment increase in blood sugar  . LEVEMIR 100 UNIT/ML injection Inject 50-55 Units into the skin 2 (two) times daily. 55 units in the morning and 50 units at bedtime  as directed  . potassium chloride SA (K-DUR,KLOR-CON) 20 MEQ tablet Take 20 mEq by mouth daily.  . pravastatin (PRAVACHOL) 40 MG tablet Take 1 tablet by mouth daily.  . sertraline (ZOLOFT) 50 MG tablet Take 50 mg by mouth daily. In the evening  . torsemide (DEMADEX) 20 MG tablet Take 60 mg by mouth every morning.   . Vitamin D, Ergocalciferol, (DRISDOL) 50000 UNITS CAPS Take 50,000 Units by mouth 2 (two) times a week. Take on Mon and Thurs.   No current facility-administered medications on file prior to visit.      Chief Complaint  Patient presents with  . Follow-up    Wears BiPAP nightly. Denies problems with mask/pressure. Needs ONO for Medicare for O2 re-qualification. DME: [employer]    Sleep tests PSG 06/30/06 >> AHI 118.7  Cardiac tests Echo 03/29/15 >> EF 55 to 60%,  grade 2 diastolic dysfx  Past medical history DVT, DM, CHF, HTN, Spinal stenosis, Colon cancer 2000, Anxiety  Past surgical history, Family history, Social history, Allergies reviewed  Vital Signs BP 122/88 (BP Location: Right Arm, Cuff Size: Normal)   Pulse 64   Ht 5' 2.5" (1.588 m)   Wt 225 lb (102.1 kg)   SpO2 93%   BMI 40.50 kg/m   History of Present Illness [name] is a 70 y.o. female with OSA/OHS.  She was previously followed by Dr. Gwenette Greet.  She was on CPAP initially, but continued to have trouble with her sleep apnea.  She was then changed to [place] in 2008, and has done better with Bipap.  She uses BiPAP and 2 liters oxygen nightly.  She is not sure how old her machine.    She has a full face mask and her current mask puts pressure on her eye.  Physical Exam  General - No distress, in wheelchair ENT - No sinus tenderness, no oral exudate, no LAN Cardiac - s1s2 regular, no murmur Chest - No wheeze/rales/dullness Back - No focal tenderness Abd - Soft, non-tender Ext - b/l BKA Neuro - Normal strength Skin - No rashes Psych - normal mood, and behavior  Assessment/Plan  Obstructive sleep apnea. - she is compliant with Bipap and reports benefit -  will arrange for new Bipap machine since her device is more than 71 years old >> explained she might need repeat sleep testing for insurance coverage of Bipap replacement - will arrange for Bipap mask refitting - will set her up for Bipap 14/10 cm H2O and adjust further based on her download results  Chronic hypoxic respiratory failure 2nd to obesity hypoventilation syndrome. - continue 2 liters oxygen at night - discussed importance of weight loss   Patient Instructions  Will arrange for new Bipap machine  Will arrange for Bipap mask refitting  Follow up in 3 months    Chesley Mires, MD [place] [facility] Pager:  [phone] 05/12/2016, 12:34 PM

## 2016-05-12 NOTE — Patient Instructions (Signed)
Will arrange for new Bipap machine  Will arrange for Bipap mask refitting  Follow up in 3 months

## 2016-05-13 DIAGNOSIS — R2681 Unsteadiness on feet: Secondary | ICD-10-CM | POA: Diagnosis not present

## 2016-05-13 DIAGNOSIS — Z89512 Acquired absence of left leg below knee: Secondary | ICD-10-CM | POA: Diagnosis not present

## 2016-05-13 DIAGNOSIS — M25662 Stiffness of left knee, not elsewhere classified: Secondary | ICD-10-CM | POA: Diagnosis not present

## 2016-05-13 DIAGNOSIS — M6281 Muscle weakness (generalized): Secondary | ICD-10-CM | POA: Diagnosis not present

## 2016-05-13 DIAGNOSIS — Z9181 History of falling: Secondary | ICD-10-CM | POA: Diagnosis not present

## 2016-05-13 DIAGNOSIS — G546 Phantom limb syndrome with pain: Secondary | ICD-10-CM | POA: Diagnosis not present

## 2016-05-13 DIAGNOSIS — Z89511 Acquired absence of right leg below knee: Secondary | ICD-10-CM | POA: Diagnosis not present

## 2016-05-13 DIAGNOSIS — M25661 Stiffness of right knee, not elsewhere classified: Secondary | ICD-10-CM | POA: Diagnosis not present

## 2016-05-20 DIAGNOSIS — Z9181 History of falling: Secondary | ICD-10-CM | POA: Diagnosis not present

## 2016-05-20 DIAGNOSIS — Z89511 Acquired absence of right leg below knee: Secondary | ICD-10-CM | POA: Diagnosis not present

## 2016-05-20 DIAGNOSIS — M25661 Stiffness of right knee, not elsewhere classified: Secondary | ICD-10-CM | POA: Diagnosis not present

## 2016-05-20 DIAGNOSIS — R2681 Unsteadiness on feet: Secondary | ICD-10-CM | POA: Diagnosis not present

## 2016-05-20 DIAGNOSIS — M6281 Muscle weakness (generalized): Secondary | ICD-10-CM | POA: Diagnosis not present

## 2016-05-20 DIAGNOSIS — M25662 Stiffness of left knee, not elsewhere classified: Secondary | ICD-10-CM | POA: Diagnosis not present

## 2016-05-20 DIAGNOSIS — G546 Phantom limb syndrome with pain: Secondary | ICD-10-CM | POA: Diagnosis not present

## 2016-05-22 DIAGNOSIS — M25662 Stiffness of left knee, not elsewhere classified: Secondary | ICD-10-CM | POA: Diagnosis not present

## 2016-05-22 DIAGNOSIS — M6281 Muscle weakness (generalized): Secondary | ICD-10-CM | POA: Diagnosis not present

## 2016-05-22 DIAGNOSIS — M25661 Stiffness of right knee, not elsewhere classified: Secondary | ICD-10-CM | POA: Diagnosis not present

## 2016-05-22 DIAGNOSIS — Z9181 History of falling: Secondary | ICD-10-CM | POA: Diagnosis not present

## 2016-05-22 DIAGNOSIS — Z89511 Acquired absence of right leg below knee: Secondary | ICD-10-CM | POA: Diagnosis not present

## 2016-05-22 DIAGNOSIS — G546 Phantom limb syndrome with pain: Secondary | ICD-10-CM | POA: Diagnosis not present

## 2016-05-27 DIAGNOSIS — M25662 Stiffness of left knee, not elsewhere classified: Secondary | ICD-10-CM | POA: Diagnosis not present

## 2016-05-27 DIAGNOSIS — Z9181 History of falling: Secondary | ICD-10-CM | POA: Diagnosis not present

## 2016-05-27 DIAGNOSIS — M6281 Muscle weakness (generalized): Secondary | ICD-10-CM | POA: Diagnosis not present

## 2016-05-27 DIAGNOSIS — Z89511 Acquired absence of right leg below knee: Secondary | ICD-10-CM | POA: Diagnosis not present

## 2016-05-27 DIAGNOSIS — G546 Phantom limb syndrome with pain: Secondary | ICD-10-CM | POA: Diagnosis not present

## 2016-05-27 DIAGNOSIS — M25661 Stiffness of right knee, not elsewhere classified: Secondary | ICD-10-CM | POA: Diagnosis not present

## 2016-05-28 ENCOUNTER — Encounter (INDEPENDENT_AMBULATORY_CARE_PROVIDER_SITE_OTHER): Payer: Self-pay | Admitting: Orthopedic Surgery

## 2016-05-28 ENCOUNTER — Ambulatory Visit (INDEPENDENT_AMBULATORY_CARE_PROVIDER_SITE_OTHER): Payer: Medicare Other | Admitting: Orthopedic Surgery

## 2016-05-28 VITALS — Ht 62.0 in | Wt 230.0 lb

## 2016-05-28 DIAGNOSIS — Z89511 Acquired absence of right leg below knee: Secondary | ICD-10-CM | POA: Diagnosis not present

## 2016-05-28 DIAGNOSIS — Z89512 Acquired absence of left leg below knee: Secondary | ICD-10-CM | POA: Diagnosis not present

## 2016-05-28 NOTE — Progress Notes (Signed)
Office Visit Note   Patient: [name]           Date of Birth: 09/14/1945           MRN: [id number] Visit Date: 05/28/2016              Requested by: Marco Collie, MD [address] [place], [place] [address] PCP: Marco Collie, MD   Assessment & Plan: Visit Diagnoses:  1. Status post bilateral below knee amputation ([place])     Plan: Follow-up as needed. Continue physical therapy with gait training [facility] rehabilitation  Follow-Up Instructions: Return if symptoms worsen or fail to improve.   Orders:  No orders of the defined types were placed in this encounter.  No orders of the defined types were placed in this encounter.     Procedures: No procedures performed   Clinical Data: No additional findings.   Subjective: Chief Complaint  Patient presents with  . Left Knee - Follow-up    Lt BKA 06/17/13  . Right Knee - Follow-up    05/04/14 Rt bka    Patient presents with bilateral transtibial amputation. She was advised by Fritz Pickerel prosthetist to go to physical therapy for gait training. She has had four therapy sessions. She has walked 230 feet. She is doing her therapy at [facility].     Review of Systems   Objective: Vital Signs: Ht 5\' 2"  (1.575 m)   Wt 230 lb (104.3 kg)   BMI 42.07 kg/m   Physical Exam Patient is alert oriented no adenopathy well-dressed normal affect normal respiratory effort.  Patient is in place a wheelchair. She has 2 well fitting transtibial amputation prosthesis. The leg show no redness no cellulitis no ulcers no signs of infection no complicating features.  Ortho Exam  Specialty Comments:  No specialty comments available.  Imaging: No results found.   PMFS History: Patient Active Problem List   Diagnosis Date Noted  . Chronic respiratory failure ([place]) 05/16/2015  . S/P BKA (below knee amputation) bilateral ([place]) 05/04/2014  . Normocytic anemia 06/17/2013  . Thrombocytopenia, unspecified 06/17/2013  . Leukocytosis,  unspecified 06/17/2013  . Osteomyelitis of foot, left, acute ([place]) 06/16/2013  . Osteomyelitis of ankle or foot ([place]) 06/16/2013  . Fatigue 01/17/2013  . Gout attack 12/15/2012  . Acute kidney failure ([place]) 09/10/2012  . Hyperkalemia 09/10/2012  . Acute on chronic renal failure ([place]) 04/27/2012  . UTI (urinary tract infection) 04/22/2012  . Cardiorenal syndrome 04/22/2012  . Hypokalemia 04/22/2012  . Hypertension 09/21/2011  . Myositis 04/22/2011  . Denervation atrophy of muscle 04/22/2011  . Diabetic foot ulcer ([place]) 04/08/2011  . Non-pressure chronic ulcer of right ankle, limited to breakdown of skin ([place]) 04/08/2011  . Chronic diastolic heart failure ([place]) 03/17/2011  . Acute on chronic diastolic heart failure ([place]) 03/11/2011  . Cough 03/03/2011  . Cellulitis 02/02/2011  . Edema 12/29/2010  . PALPITATIONS 04/22/2010  . ABNORMAL HEART RHYTHMS 03/14/2010  . Carotid artery stenosis 12/24/2009  . PULMONARY HYPERTENSION, SECONDARY 01/09/2009  . CARDIOMYOPATHY, PRIMARY, DILATED 01/09/2009  . CAROTID BRUIT 01/09/2009  . Pure hypercholesterolemia 12/31/2008  . Obesity hypoventilation syndrome ([place]) 10/31/2007  . DM 05/05/2007  . Obstructive sleep apnea 05/05/2007  . HYPERTENSION 05/05/2007  . CONGESTIVE HEART FAILURE 05/05/2007  . DEEP VENOUS THROMBOPHLEBITIS 05/05/2007   Past Medical History:  Diagnosis Date  . Anxiety   . Arthritis   . Cancer ([place])    hx of colon CA 2000  . Carotid stenosis  u/s 6/10: R 0-39%Ll 40-59%;  u/s 7/12: 40-59% bilateral (repeat in 7/13)  . CHF (congestive heart failure) (HCC)    hx of EF 15-20% in 4/08,  echo 3/09 EF 60-65%;   echo 6/12: EF 55-60%, mild LAE  . Complication of anesthesia    Oxygen drops   . DM (diabetes mellitus) ([place])   . Family history of anesthesia complication    Mother N/V  . Head injury, acute, without loss of consciousness   . History of blood transfusion   . HTN (hypertension)   . Hypokalemia   . Morbid obesity  ([place])   . Obesity hypoventilation syndrome ([place])   . OSA (obstructive sleep apnea)   . 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 PONV (postoperative nausea and vomiting)    after colon surgery 2000  . Pulmonary HTN    mild,  cath 6/08 with PVR 5.1  . Spinal stenosis   . Thrombophlebitis of deep vein of leg (HCC)     Family History  Problem Relation Age of Onset  . Coronary artery disease Father     Past Surgical History:  Procedure Laterality Date  . AMPUTATION Left 10/07/2012   Procedure: AMPUTATION RAY;  Surgeon: Newt Minion, MD;  Location: [facility];  Service: Orthopedics;  Laterality: Left;  Left foot 1st Ray Amputation  . AMPUTATION Left 06/18/2013   Procedure: AMPUTATION BELOW KNEE;  Surgeon: Newt Minion, MD;  Location: [facility];  Service: Orthopedics;  Laterality: Left;  . AMPUTATION Right 05/04/2014   Procedure: Right Below Knee Amputation;  Surgeon: Newt Minion, MD;  Location: [facility];  Service: Orthopedics;  Laterality: Right;  . CARPAL TUNNEL RELEASE Left 07/2007  . CHOLECYSTECTOMY  2002  . Colon Cancer with resection  2000  . COLONOSCOPY    . EYE SURGERY Bilateral    bilateral cataracts   . VAGINAL HYSTERECTOMY  1983   Social History   Occupational History  . DISABLED Disability   Social History Main Topics  . Smoking status: Never Smoker  . Smokeless tobacco: Never Used  . Alcohol use No  . Drug use: No  . Sexual activity: No

## 2016-05-29 DIAGNOSIS — Z89511 Acquired absence of right leg below knee: Secondary | ICD-10-CM | POA: Diagnosis not present

## 2016-05-29 DIAGNOSIS — M25661 Stiffness of right knee, not elsewhere classified: Secondary | ICD-10-CM | POA: Diagnosis not present

## 2016-05-29 DIAGNOSIS — Z9181 History of falling: Secondary | ICD-10-CM | POA: Diagnosis not present

## 2016-05-29 DIAGNOSIS — M25662 Stiffness of left knee, not elsewhere classified: Secondary | ICD-10-CM | POA: Diagnosis not present

## 2016-05-29 DIAGNOSIS — M6281 Muscle weakness (generalized): Secondary | ICD-10-CM | POA: Diagnosis not present

## 2016-05-29 DIAGNOSIS — G546 Phantom limb syndrome with pain: Secondary | ICD-10-CM | POA: Diagnosis not present

## 2016-06-02 DIAGNOSIS — G546 Phantom limb syndrome with pain: Secondary | ICD-10-CM | POA: Diagnosis not present

## 2016-06-02 DIAGNOSIS — Z89511 Acquired absence of right leg below knee: Secondary | ICD-10-CM | POA: Diagnosis not present

## 2016-06-02 DIAGNOSIS — M25661 Stiffness of right knee, not elsewhere classified: Secondary | ICD-10-CM | POA: Diagnosis not present

## 2016-06-02 DIAGNOSIS — M25662 Stiffness of left knee, not elsewhere classified: Secondary | ICD-10-CM | POA: Diagnosis not present

## 2016-06-02 DIAGNOSIS — Z9181 History of falling: Secondary | ICD-10-CM | POA: Diagnosis not present

## 2016-06-02 DIAGNOSIS — M6281 Muscle weakness (generalized): Secondary | ICD-10-CM | POA: Diagnosis not present

## 2016-06-04 ENCOUNTER — Telehealth: Payer: Self-pay | Admitting: Pulmonary Disease

## 2016-06-04 DIAGNOSIS — Z89511 Acquired absence of right leg below knee: Secondary | ICD-10-CM | POA: Diagnosis not present

## 2016-06-04 DIAGNOSIS — M25662 Stiffness of left knee, not elsewhere classified: Secondary | ICD-10-CM | POA: Diagnosis not present

## 2016-06-04 DIAGNOSIS — M6281 Muscle weakness (generalized): Secondary | ICD-10-CM | POA: Diagnosis not present

## 2016-06-04 DIAGNOSIS — M25661 Stiffness of right knee, not elsewhere classified: Secondary | ICD-10-CM | POA: Diagnosis not present

## 2016-06-04 DIAGNOSIS — G546 Phantom limb syndrome with pain: Secondary | ICD-10-CM | POA: Diagnosis not present

## 2016-06-04 DIAGNOSIS — Z9181 History of falling: Secondary | ICD-10-CM | POA: Diagnosis not present

## 2016-06-04 NOTE — Telephone Encounter (Signed)
Rodena Piety do you happen to have this CMN that Kistler from [place] is needing back?  Please advise thanks.

## 2016-06-04 NOTE — Telephone Encounter (Signed)
I just called and explained that only 1 page was signed by Dr. Halford Chessman and would have to wait until he is back in office to get the other page signed. I faxed what I had to [place] with [employer]

## 2016-06-04 NOTE — Telephone Encounter (Signed)
Dr. Halford Chessman has the form in his CMN folder to sign. It was a 2 page document to sign and he only signed the 1st page. I will call [facility] to see if they can process with just the 1st page being signed

## 2016-06-05 ENCOUNTER — Telehealth: Payer: Self-pay | Admitting: Pulmonary Disease

## 2016-06-05 NOTE — Telephone Encounter (Signed)
Called and spoke with Jenny Reichmann from [facility] and she stated that they do not have the pt on bipap, only on cpap. The orders for the supplies are the same for the cpap and bipap.  They will have their respiratory therapist reach out to the pt and have her bring her machine in and see if this is a bipap or cpap.  They will call back to let us know if they need a new order or not.

## 2016-06-08 ENCOUNTER — Telehealth: Payer: Self-pay | Admitting: Pulmonary Disease

## 2016-06-08 NOTE — Telephone Encounter (Signed)
ATC [name] back, it is after 5pm and the calls go to answering service. Will call in morning on 11.21.17.

## 2016-06-08 NOTE — Telephone Encounter (Signed)
[name] needs to talk to someone

## 2016-06-09 NOTE — Telephone Encounter (Signed)
atc Cindy at [employer], again it is after 5:00 and call went to answering service.  Will call back.

## 2016-06-10 DIAGNOSIS — G546 Phantom limb syndrome with pain: Secondary | ICD-10-CM | POA: Diagnosis not present

## 2016-06-10 DIAGNOSIS — M25661 Stiffness of right knee, not elsewhere classified: Secondary | ICD-10-CM | POA: Diagnosis not present

## 2016-06-10 DIAGNOSIS — Z89511 Acquired absence of right leg below knee: Secondary | ICD-10-CM | POA: Diagnosis not present

## 2016-06-10 DIAGNOSIS — M6281 Muscle weakness (generalized): Secondary | ICD-10-CM | POA: Diagnosis not present

## 2016-06-10 DIAGNOSIS — M25662 Stiffness of left knee, not elsewhere classified: Secondary | ICD-10-CM | POA: Diagnosis not present

## 2016-06-10 DIAGNOSIS — Z9181 History of falling: Secondary | ICD-10-CM | POA: Diagnosis not present

## 2016-06-10 NOTE — Telephone Encounter (Signed)
VS  Please Advise  Cindy from [facility] called and stated she needs your note to state that pt. Has tried CPAP and failed and that is why she was placed on a BiPAP. Once your note states this then just needs notes re-faxed over to them.  Fax number [phone]

## 2016-06-15 ENCOUNTER — Telehealth: Payer: Self-pay | Admitting: Pulmonary Disease

## 2016-06-15 NOTE — Telephone Encounter (Signed)
Please see phone note from 11.27.17 as this note was closed.

## 2016-06-15 NOTE — Telephone Encounter (Signed)
Request has been sent to Blond[facility] Reveal for paper chart request Sharl Ma has been copied on the e-mail and chart is to be delivered to mine and Dr Juanetta Gosling attention. Will send to Dr Halford Chessman as Juluis Rainier

## 2016-06-15 NOTE — Telephone Encounter (Signed)
Called and spoke to [place] and informed her that a message was sent to VS about the note needing addended. And once this has been down we will fax the [place] note. (phone note from 11.20.17 was closed, will route this message so it can be followed)  Phone note from 11.20.17   VS  Please Advise  Cindy from [place] called and stated she needs your note to state that pt. Has tried CPAP and failed and that is why she was placed on a BiPAP. Once your note states this then just needs notes re-faxed over to them.  Fax number [phone]

## 2016-06-15 NOTE — Telephone Encounter (Signed)
She was seen by Dr. Gwenette Greet prior to be switched to me.  Records in Epic from Dr. Gwenette Greet go back to 10/31/07 and mention that she has been maintained on Bipap.  There is no mention of whether she was tried on CPAP prior to this date.  I will need to have her paper chart pulled for review to determine if she was ever tried on CPAP prior to Bipap.

## 2016-06-16 NOTE — Telephone Encounter (Signed)
Jenny Reichmann is calling back again [phone]

## 2016-06-16 NOTE — Telephone Encounter (Signed)
Leafy Ro called [phone] said we could call her once it is ready

## 2016-06-16 NOTE — Telephone Encounter (Signed)
VS please advise 

## 2016-06-17 NOTE — Telephone Encounter (Signed)
VS please advise if you can addend pt's OV note. Thanks.

## 2016-06-19 NOTE — Telephone Encounter (Signed)
Spoke with [facility] and made them aware of VS message. They stated they would be able to pull the note from Epic. Nothing further needed at this time.

## 2016-06-19 NOTE — Telephone Encounter (Signed)
Chart reviewed and addendum made to her office note from 05/12/16.  Please inform [facility] of this.

## 2016-06-19 NOTE — Telephone Encounter (Signed)
I am still waiting for her old paper chart to determine if/when she was ever tried on CPAP prior to using Bipap.  Please check on status of when her paper chart will be available for review.

## 2016-06-19 NOTE — Telephone Encounter (Signed)
Paper chart received and handed to VS Routing to VS

## 2016-06-23 NOTE — Telephone Encounter (Signed)
VS is done looking through chart and this is being sent back to Medical Records. Nothing further needed.

## 2016-06-30 DIAGNOSIS — Z89512 Acquired absence of left leg below knee: Secondary | ICD-10-CM | POA: Diagnosis not present

## 2016-06-30 DIAGNOSIS — R262 Difficulty in walking, not elsewhere classified: Secondary | ICD-10-CM | POA: Diagnosis not present

## 2016-06-30 DIAGNOSIS — M25661 Stiffness of right knee, not elsewhere classified: Secondary | ICD-10-CM | POA: Diagnosis not present

## 2016-06-30 DIAGNOSIS — M6281 Muscle weakness (generalized): Secondary | ICD-10-CM | POA: Diagnosis not present

## 2016-06-30 DIAGNOSIS — Z89511 Acquired absence of right leg below knee: Secondary | ICD-10-CM | POA: Diagnosis not present

## 2016-06-30 DIAGNOSIS — G546 Phantom limb syndrome with pain: Secondary | ICD-10-CM | POA: Diagnosis not present

## 2016-06-30 DIAGNOSIS — M25662 Stiffness of left knee, not elsewhere classified: Secondary | ICD-10-CM | POA: Diagnosis not present

## 2016-06-30 DIAGNOSIS — Z9181 History of falling: Secondary | ICD-10-CM | POA: Diagnosis not present

## 2016-07-03 DIAGNOSIS — Z89511 Acquired absence of right leg below knee: Secondary | ICD-10-CM | POA: Diagnosis not present

## 2016-07-03 DIAGNOSIS — Z9181 History of falling: Secondary | ICD-10-CM | POA: Diagnosis not present

## 2016-07-03 DIAGNOSIS — Z89512 Acquired absence of left leg below knee: Secondary | ICD-10-CM | POA: Diagnosis not present

## 2016-07-03 DIAGNOSIS — M6281 Muscle weakness (generalized): Secondary | ICD-10-CM | POA: Diagnosis not present

## 2016-07-03 DIAGNOSIS — G546 Phantom limb syndrome with pain: Secondary | ICD-10-CM | POA: Diagnosis not present

## 2016-07-03 DIAGNOSIS — M25661 Stiffness of right knee, not elsewhere classified: Secondary | ICD-10-CM | POA: Diagnosis not present

## 2016-07-08 DIAGNOSIS — Z89511 Acquired absence of right leg below knee: Secondary | ICD-10-CM | POA: Diagnosis not present

## 2016-07-08 DIAGNOSIS — G546 Phantom limb syndrome with pain: Secondary | ICD-10-CM | POA: Diagnosis not present

## 2016-07-08 DIAGNOSIS — M6281 Muscle weakness (generalized): Secondary | ICD-10-CM | POA: Diagnosis not present

## 2016-07-08 DIAGNOSIS — Z9181 History of falling: Secondary | ICD-10-CM | POA: Diagnosis not present

## 2016-07-08 DIAGNOSIS — Z89512 Acquired absence of left leg below knee: Secondary | ICD-10-CM | POA: Diagnosis not present

## 2016-07-08 DIAGNOSIS — M25661 Stiffness of right knee, not elsewhere classified: Secondary | ICD-10-CM | POA: Diagnosis not present

## 2016-07-10 DIAGNOSIS — M6281 Muscle weakness (generalized): Secondary | ICD-10-CM | POA: Diagnosis not present

## 2016-07-10 DIAGNOSIS — G546 Phantom limb syndrome with pain: Secondary | ICD-10-CM | POA: Diagnosis not present

## 2016-07-10 DIAGNOSIS — Z89512 Acquired absence of left leg below knee: Secondary | ICD-10-CM | POA: Diagnosis not present

## 2016-07-10 DIAGNOSIS — Z89511 Acquired absence of right leg below knee: Secondary | ICD-10-CM | POA: Diagnosis not present

## 2016-07-10 DIAGNOSIS — M25661 Stiffness of right knee, not elsewhere classified: Secondary | ICD-10-CM | POA: Diagnosis not present

## 2016-07-10 DIAGNOSIS — Z9181 History of falling: Secondary | ICD-10-CM | POA: Diagnosis not present

## 2016-07-30 DIAGNOSIS — Z89512 Acquired absence of left leg below knee: Secondary | ICD-10-CM | POA: Diagnosis not present

## 2016-07-30 DIAGNOSIS — R261 Paralytic gait: Secondary | ICD-10-CM | POA: Diagnosis not present

## 2016-07-30 DIAGNOSIS — M25662 Stiffness of left knee, not elsewhere classified: Secondary | ICD-10-CM | POA: Diagnosis not present

## 2016-07-30 DIAGNOSIS — Z9181 History of falling: Secondary | ICD-10-CM | POA: Diagnosis not present

## 2016-07-30 DIAGNOSIS — G546 Phantom limb syndrome with pain: Secondary | ICD-10-CM | POA: Diagnosis not present

## 2016-07-30 DIAGNOSIS — R262 Difficulty in walking, not elsewhere classified: Secondary | ICD-10-CM | POA: Diagnosis not present

## 2016-07-30 DIAGNOSIS — M6281 Muscle weakness (generalized): Secondary | ICD-10-CM | POA: Diagnosis not present

## 2016-07-30 DIAGNOSIS — M25661 Stiffness of right knee, not elsewhere classified: Secondary | ICD-10-CM | POA: Diagnosis not present

## 2016-07-30 DIAGNOSIS — Z89511 Acquired absence of right leg below knee: Secondary | ICD-10-CM | POA: Diagnosis not present

## 2016-08-11 DIAGNOSIS — Z89512 Acquired absence of left leg below knee: Secondary | ICD-10-CM | POA: Diagnosis not present

## 2016-08-11 DIAGNOSIS — G546 Phantom limb syndrome with pain: Secondary | ICD-10-CM | POA: Diagnosis not present

## 2016-08-11 DIAGNOSIS — Z9181 History of falling: Secondary | ICD-10-CM | POA: Diagnosis not present

## 2016-08-11 DIAGNOSIS — Z89511 Acquired absence of right leg below knee: Secondary | ICD-10-CM | POA: Diagnosis not present

## 2016-08-11 DIAGNOSIS — M25661 Stiffness of right knee, not elsewhere classified: Secondary | ICD-10-CM | POA: Diagnosis not present

## 2016-08-11 DIAGNOSIS — M6281 Muscle weakness (generalized): Secondary | ICD-10-CM | POA: Diagnosis not present

## 2016-08-12 ENCOUNTER — Encounter: Payer: Self-pay | Admitting: Pulmonary Disease

## 2016-08-12 ENCOUNTER — Ambulatory Visit (INDEPENDENT_AMBULATORY_CARE_PROVIDER_SITE_OTHER): Payer: Medicare Other | Admitting: Pulmonary Disease

## 2016-08-12 VITALS — BP 118/74 | HR 56 | Ht 62.5 in | Wt 223.0 lb

## 2016-08-12 DIAGNOSIS — E662 Morbid (severe) obesity with alveolar hypoventilation: Secondary | ICD-10-CM | POA: Diagnosis not present

## 2016-08-12 DIAGNOSIS — G4733 Obstructive sleep apnea (adult) (pediatric): Secondary | ICD-10-CM | POA: Diagnosis not present

## 2016-08-12 DIAGNOSIS — J9611 Chronic respiratory failure with hypoxia: Secondary | ICD-10-CM | POA: Diagnosis not present

## 2016-08-12 DIAGNOSIS — Z6841 Body Mass Index (BMI) 40.0 and over, adult: Secondary | ICD-10-CM

## 2016-08-12 DIAGNOSIS — R040 Epistaxis: Secondary | ICD-10-CM

## 2016-08-12 NOTE — Progress Notes (Signed)
Current Outpatient Prescriptions on File Prior to Visit  Medication Sig  . acetaminophen (TYLENOL) 500 MG tablet Take 500 mg by mouth every 6 (six) hours as needed for mild pain.   Marland Kitchen allopurinol (ZYLOPRIM) 300 MG tablet Take 300 mg by mouth daily.  Marland Kitchen amLODipine (NORVASC) 5 MG tablet Take 5 mg by mouth at bedtime.   Marland Kitchen aspirin (BAYER ASPIRIN) 325 MG tablet Take 325 mg by mouth daily.    . carvedilol (COREG) 3.125 MG tablet Take 3.125 mg by mouth 2 (two) times daily with a meal.   . docusate sodium (COLACE) 100 MG capsule Take 100 mg by mouth 2 (two) times daily.  . ferrous sulfate 325 (65 FE) MG tablet Take 325 mg by mouth daily with breakfast.  . insulin lispro (HUMALOG) 100 UNIT/ML injection Inject 2-4 Units into the skin 3 (three) times daily before meals. Sliding Scale   150-200 2 units , add an additional 2 units for each 50 increment increase in blood sugar  . LEVEMIR 100 UNIT/ML injection Inject 50-55 Units into the skin 2 (two) times daily. 55 units in the morning and 50 units at bedtime  as directed  . potassium chloride SA (K-DUR,KLOR-CON) 20 MEQ tablet Take 20 mEq by mouth daily.  . pravastatin (PRAVACHOL) 40 MG tablet Take 1 tablet by mouth daily.  . sertraline (ZOLOFT) 50 MG tablet Take 50 mg by mouth daily. In the evening  . torsemide (DEMADEX) 20 MG tablet Take 60 mg by mouth every morning.   . Vitamin D, Ergocalciferol, (DRISDOL) 50000 UNITS CAPS Take 50,000 Units by mouth 2 (two) times a week. Take on Mon and Thurs.   No current facility-administered medications on file prior to visit.      Chief Complaint  Patient presents with  . Follow-up    Pt has not recieved new BiPAP machine. No card was sent back to the patient so we are unable to get a download today.  Using BiPAP nightly, denies any problems with mask/pressure. Pt states that she is having some nasal bleeding in the mornings. DME: [facility]    Sleep tests PSG 06/30/06 >> AHI 118.7  Cardiac tests Echo 03/29/15 >>  EF 55 to 123456, grade 2 diastolic dysfx  Past medical history DVT, DM, CHF, HTN, Spinal stenosis, Colon cancer 2000, Anxiety  Past surgical history, Family history, Social history, Allergies reviewed  Vital Signs BP 118/74 (BP Location: Left Arm, Cuff Size: Normal)   Pulse (!) 56   Ht 5' 2.5" (1.588 m)   Wt 223 lb (101.2 kg)   SpO2 93%   BMI 40.14 kg/m   History of Present Illness [name] is a 71 y.o. female with OSA/OHS.  She still has not received new Bipap machine.  Her DME was confused about which type of device she had, and was confirmed that she has Bipap.  She was initially started on CPAP, but was not able to tolerate this.  She was switched to Bipap in 2008.  She has been getting dryness in her nose and this causes nose bleeds sometimes.  Her current Bipap doesn't have a humidifier.  Physical Exam  General - pleasant, sitting in a wheelchair ENT - no sinus tenderness, dry nasal mucosa, no oral exudate Cardiac - regular, no murmur Chest - decreased BS, no wheeze Back - no tenderness Abd - soft, non tender Ext - b/l lower leg prostheses Neuro - normal strength Skin - no rashes Psych - normal mood  Assessment/Plan  Obstructive sleep apnea. - she reports compliance and benefit from Bipap - her current device is ancient and she needs a new Bipap machine - will try again to arrange for new Bipap 14/10 cm H2O with heated humidity - I explained that her insurance might require additional testing before approving new Bipap machine, but these aren't medically necessary and insurance companies have there own rules about what is necessary for them  Chronic hypoxic respiratory failure 2nd to obesity hypoventilation syndrome. - continue 2 liters oxygen at night with Bipap  Obesity. - discussed importance of weight loss  Nose bleeds. - likely related to dry air from Bipap machine that doesn't have humidifier - she needs new Bipap machine with  humidifier   Patient Instructions  Will try to arrange for new Bipap machine  Follow up in 3 months    Chesley Mires, MD [facility] Pager:  [phone] 08/12/2016, 11:33 AM

## 2016-08-12 NOTE — Patient Instructions (Signed)
Will try to arrange for new Bipap machine  Follow up in 3 months

## 2016-08-14 DIAGNOSIS — M25661 Stiffness of right knee, not elsewhere classified: Secondary | ICD-10-CM | POA: Diagnosis not present

## 2016-08-14 DIAGNOSIS — Z9181 History of falling: Secondary | ICD-10-CM | POA: Diagnosis not present

## 2016-08-14 DIAGNOSIS — Z89511 Acquired absence of right leg below knee: Secondary | ICD-10-CM | POA: Diagnosis not present

## 2016-08-14 DIAGNOSIS — M6281 Muscle weakness (generalized): Secondary | ICD-10-CM | POA: Diagnosis not present

## 2016-08-14 DIAGNOSIS — Z89512 Acquired absence of left leg below knee: Secondary | ICD-10-CM | POA: Diagnosis not present

## 2016-08-14 DIAGNOSIS — G546 Phantom limb syndrome with pain: Secondary | ICD-10-CM | POA: Diagnosis not present

## 2016-08-21 DIAGNOSIS — E1151 Type 2 diabetes mellitus with diabetic peripheral angiopathy without gangrene: Secondary | ICD-10-CM | POA: Diagnosis not present

## 2016-08-21 DIAGNOSIS — E782 Mixed hyperlipidemia: Secondary | ICD-10-CM | POA: Diagnosis not present

## 2016-08-21 DIAGNOSIS — I129 Hypertensive chronic kidney disease with stage 1 through stage 4 chronic kidney disease, or unspecified chronic kidney disease: Secondary | ICD-10-CM | POA: Diagnosis not present

## 2016-08-25 DIAGNOSIS — M25652 Stiffness of left hip, not elsewhere classified: Secondary | ICD-10-CM | POA: Diagnosis not present

## 2016-08-25 DIAGNOSIS — Z9181 History of falling: Secondary | ICD-10-CM | POA: Diagnosis not present

## 2016-08-25 DIAGNOSIS — M25661 Stiffness of right knee, not elsewhere classified: Secondary | ICD-10-CM | POA: Diagnosis not present

## 2016-08-25 DIAGNOSIS — R262 Difficulty in walking, not elsewhere classified: Secondary | ICD-10-CM | POA: Diagnosis not present

## 2016-08-25 DIAGNOSIS — G546 Phantom limb syndrome with pain: Secondary | ICD-10-CM | POA: Diagnosis not present

## 2016-08-25 DIAGNOSIS — Z89511 Acquired absence of right leg below knee: Secondary | ICD-10-CM | POA: Diagnosis not present

## 2016-08-25 DIAGNOSIS — R2681 Unsteadiness on feet: Secondary | ICD-10-CM | POA: Diagnosis not present

## 2016-08-25 DIAGNOSIS — M6281 Muscle weakness (generalized): Secondary | ICD-10-CM | POA: Diagnosis not present

## 2016-08-25 DIAGNOSIS — Z89512 Acquired absence of left leg below knee: Secondary | ICD-10-CM | POA: Diagnosis not present

## 2016-08-27 DIAGNOSIS — N289 Disorder of kidney and ureter, unspecified: Secondary | ICD-10-CM | POA: Diagnosis not present

## 2016-09-02 DIAGNOSIS — E860 Dehydration: Secondary | ICD-10-CM | POA: Diagnosis present

## 2016-09-02 DIAGNOSIS — M1000 Idiopathic gout, unspecified site: Secondary | ICD-10-CM | POA: Diagnosis not present

## 2016-09-02 DIAGNOSIS — R2689 Other abnormalities of gait and mobility: Secondary | ICD-10-CM | POA: Diagnosis not present

## 2016-09-02 DIAGNOSIS — Z794 Long term (current) use of insulin: Secondary | ICD-10-CM | POA: Diagnosis not present

## 2016-09-02 DIAGNOSIS — R0602 Shortness of breath: Secondary | ICD-10-CM | POA: Diagnosis not present

## 2016-09-02 DIAGNOSIS — M6281 Muscle weakness (generalized): Secondary | ICD-10-CM | POA: Diagnosis not present

## 2016-09-02 DIAGNOSIS — E1122 Type 2 diabetes mellitus with diabetic chronic kidney disease: Secondary | ICD-10-CM | POA: Diagnosis not present

## 2016-09-02 DIAGNOSIS — J80 Acute respiratory distress syndrome: Secondary | ICD-10-CM | POA: Diagnosis not present

## 2016-09-02 DIAGNOSIS — R05 Cough: Secondary | ICD-10-CM | POA: Diagnosis not present

## 2016-09-02 DIAGNOSIS — Z89511 Acquired absence of right leg below knee: Secondary | ICD-10-CM | POA: Diagnosis not present

## 2016-09-02 DIAGNOSIS — E7800 Pure hypercholesterolemia, unspecified: Secondary | ICD-10-CM | POA: Diagnosis present

## 2016-09-02 DIAGNOSIS — B9620 Unspecified Escherichia coli [E. coli] as the cause of diseases classified elsewhere: Secondary | ICD-10-CM | POA: Diagnosis present

## 2016-09-02 DIAGNOSIS — R2681 Unsteadiness on feet: Secondary | ICD-10-CM | POA: Diagnosis not present

## 2016-09-02 DIAGNOSIS — Z881 Allergy status to other antibiotic agents status: Secondary | ICD-10-CM | POA: Diagnosis not present

## 2016-09-02 DIAGNOSIS — N390 Urinary tract infection, site not specified: Secondary | ICD-10-CM | POA: Diagnosis not present

## 2016-09-02 DIAGNOSIS — E162 Hypoglycemia, unspecified: Secondary | ICD-10-CM | POA: Diagnosis not present

## 2016-09-02 DIAGNOSIS — Z7982 Long term (current) use of aspirin: Secondary | ICD-10-CM | POA: Diagnosis not present

## 2016-09-02 DIAGNOSIS — R509 Fever, unspecified: Secondary | ICD-10-CM | POA: Diagnosis not present

## 2016-09-02 DIAGNOSIS — N179 Acute kidney failure, unspecified: Secondary | ICD-10-CM | POA: Diagnosis not present

## 2016-09-02 DIAGNOSIS — A498 Other bacterial infections of unspecified site: Secondary | ICD-10-CM | POA: Diagnosis not present

## 2016-09-02 DIAGNOSIS — Z89512 Acquired absence of left leg below knee: Secondary | ICD-10-CM | POA: Diagnosis not present

## 2016-09-02 DIAGNOSIS — Z885 Allergy status to narcotic agent status: Secondary | ICD-10-CM | POA: Diagnosis not present

## 2016-09-02 DIAGNOSIS — Z85038 Personal history of other malignant neoplasm of large intestine: Secondary | ICD-10-CM | POA: Diagnosis not present

## 2016-09-02 DIAGNOSIS — R5383 Other fatigue: Secondary | ICD-10-CM | POA: Diagnosis not present

## 2016-09-02 DIAGNOSIS — Z79899 Other long term (current) drug therapy: Secondary | ICD-10-CM | POA: Diagnosis not present

## 2016-09-02 DIAGNOSIS — I130 Hypertensive heart and chronic kidney disease with heart failure and stage 1 through stage 4 chronic kidney disease, or unspecified chronic kidney disease: Secondary | ICD-10-CM | POA: Diagnosis not present

## 2016-09-02 DIAGNOSIS — G4733 Obstructive sleep apnea (adult) (pediatric): Secondary | ICD-10-CM | POA: Diagnosis not present

## 2016-09-02 DIAGNOSIS — R0902 Hypoxemia: Secondary | ICD-10-CM | POA: Diagnosis not present

## 2016-09-02 DIAGNOSIS — Z7189 Other specified counseling: Secondary | ICD-10-CM | POA: Diagnosis not present

## 2016-09-02 DIAGNOSIS — I129 Hypertensive chronic kidney disease with stage 1 through stage 4 chronic kidney disease, or unspecified chronic kidney disease: Secondary | ICD-10-CM | POA: Diagnosis not present

## 2016-09-02 DIAGNOSIS — R0603 Acute respiratory distress: Secondary | ICD-10-CM | POA: Diagnosis not present

## 2016-09-02 DIAGNOSIS — Z888 Allergy status to other drugs, medicaments and biological substances status: Secondary | ICD-10-CM | POA: Diagnosis not present

## 2016-09-02 DIAGNOSIS — R278 Other lack of coordination: Secondary | ICD-10-CM | POA: Diagnosis not present

## 2016-09-02 DIAGNOSIS — S88911A Complete traumatic amputation of right lower leg, level unspecified, initial encounter: Secondary | ICD-10-CM | POA: Diagnosis not present

## 2016-09-02 DIAGNOSIS — I509 Heart failure, unspecified: Secondary | ICD-10-CM | POA: Diagnosis not present

## 2016-09-02 DIAGNOSIS — B999 Unspecified infectious disease: Secondary | ICD-10-CM | POA: Diagnosis not present

## 2016-09-02 DIAGNOSIS — J189 Pneumonia, unspecified organism: Secondary | ICD-10-CM | POA: Diagnosis not present

## 2016-09-02 DIAGNOSIS — N189 Chronic kidney disease, unspecified: Secondary | ICD-10-CM | POA: Diagnosis present

## 2016-09-02 DIAGNOSIS — Z66 Do not resuscitate: Secondary | ICD-10-CM | POA: Diagnosis present

## 2016-09-02 DIAGNOSIS — E119 Type 2 diabetes mellitus without complications: Secondary | ICD-10-CM | POA: Diagnosis not present

## 2016-09-02 DIAGNOSIS — E1159 Type 2 diabetes mellitus with other circulatory complications: Secondary | ICD-10-CM | POA: Diagnosis not present

## 2016-09-05 DIAGNOSIS — A498 Other bacterial infections of unspecified site: Secondary | ICD-10-CM

## 2016-09-06 DIAGNOSIS — I509 Heart failure, unspecified: Secondary | ICD-10-CM

## 2016-09-06 DIAGNOSIS — E162 Hypoglycemia, unspecified: Secondary | ICD-10-CM

## 2016-09-08 DIAGNOSIS — E872 Acidosis: Secondary | ICD-10-CM | POA: Diagnosis not present

## 2016-09-08 DIAGNOSIS — E11649 Type 2 diabetes mellitus with hypoglycemia without coma: Secondary | ICD-10-CM | POA: Diagnosis present

## 2016-09-08 DIAGNOSIS — Z89512 Acquired absence of left leg below knee: Secondary | ICD-10-CM | POA: Diagnosis not present

## 2016-09-08 DIAGNOSIS — Z515 Encounter for palliative care: Secondary | ICD-10-CM | POA: Diagnosis present

## 2016-09-08 DIAGNOSIS — I272 Pulmonary hypertension, unspecified: Secondary | ICD-10-CM | POA: Diagnosis present

## 2016-09-08 DIAGNOSIS — D696 Thrombocytopenia, unspecified: Secondary | ICD-10-CM | POA: Diagnosis present

## 2016-09-08 DIAGNOSIS — N189 Chronic kidney disease, unspecified: Secondary | ICD-10-CM | POA: Diagnosis not present

## 2016-09-08 DIAGNOSIS — R0609 Other forms of dyspnea: Secondary | ICD-10-CM | POA: Diagnosis not present

## 2016-09-08 DIAGNOSIS — Y95 Nosocomial condition: Secondary | ICD-10-CM | POA: Diagnosis present

## 2016-09-08 DIAGNOSIS — N183 Chronic kidney disease, stage 3 (moderate): Secondary | ICD-10-CM | POA: Diagnosis present

## 2016-09-08 DIAGNOSIS — I130 Hypertensive heart and chronic kidney disease with heart failure and stage 1 through stage 4 chronic kidney disease, or unspecified chronic kidney disease: Secondary | ICD-10-CM | POA: Diagnosis present

## 2016-09-08 DIAGNOSIS — Z7189 Other specified counseling: Secondary | ICD-10-CM | POA: Diagnosis not present

## 2016-09-08 DIAGNOSIS — J9622 Acute and chronic respiratory failure with hypercapnia: Secondary | ICD-10-CM | POA: Diagnosis present

## 2016-09-08 DIAGNOSIS — Z89511 Acquired absence of right leg below knee: Secondary | ICD-10-CM | POA: Diagnosis not present

## 2016-09-08 DIAGNOSIS — J80 Acute respiratory distress syndrome: Secondary | ICD-10-CM | POA: Diagnosis not present

## 2016-09-08 DIAGNOSIS — J9601 Acute respiratory failure with hypoxia: Secondary | ICD-10-CM | POA: Diagnosis not present

## 2016-09-08 DIAGNOSIS — J189 Pneumonia, unspecified organism: Secondary | ICD-10-CM | POA: Diagnosis not present

## 2016-09-08 DIAGNOSIS — I5031 Acute diastolic (congestive) heart failure: Secondary | ICD-10-CM | POA: Diagnosis not present

## 2016-09-08 DIAGNOSIS — G4733 Obstructive sleep apnea (adult) (pediatric): Secondary | ICD-10-CM | POA: Diagnosis not present

## 2016-09-08 DIAGNOSIS — A498 Other bacterial infections of unspecified site: Secondary | ICD-10-CM | POA: Diagnosis not present

## 2016-09-08 DIAGNOSIS — R2681 Unsteadiness on feet: Secondary | ICD-10-CM | POA: Diagnosis not present

## 2016-09-08 DIAGNOSIS — R5383 Other fatigue: Secondary | ICD-10-CM | POA: Diagnosis not present

## 2016-09-08 DIAGNOSIS — G92 Toxic encephalopathy: Secondary | ICD-10-CM | POA: Diagnosis not present

## 2016-09-08 DIAGNOSIS — R2689 Other abnormalities of gait and mobility: Secondary | ICD-10-CM | POA: Diagnosis not present

## 2016-09-08 DIAGNOSIS — J9621 Acute and chronic respiratory failure with hypoxia: Secondary | ICD-10-CM | POA: Diagnosis present

## 2016-09-08 DIAGNOSIS — I472 Ventricular tachycardia: Secondary | ICD-10-CM | POA: Diagnosis not present

## 2016-09-08 DIAGNOSIS — Z66 Do not resuscitate: Secondary | ICD-10-CM | POA: Diagnosis not present

## 2016-09-08 DIAGNOSIS — R944 Abnormal results of kidney function studies: Secondary | ICD-10-CM | POA: Diagnosis present

## 2016-09-08 DIAGNOSIS — R9431 Abnormal electrocardiogram [ECG] [EKG]: Secondary | ICD-10-CM | POA: Diagnosis not present

## 2016-09-08 DIAGNOSIS — J9600 Acute respiratory failure, unspecified whether with hypoxia or hypercapnia: Secondary | ICD-10-CM | POA: Diagnosis not present

## 2016-09-08 DIAGNOSIS — G9341 Metabolic encephalopathy: Secondary | ICD-10-CM | POA: Diagnosis present

## 2016-09-08 DIAGNOSIS — E875 Hyperkalemia: Secondary | ICD-10-CM | POA: Diagnosis present

## 2016-09-08 DIAGNOSIS — B999 Unspecified infectious disease: Secondary | ICD-10-CM | POA: Diagnosis not present

## 2016-09-08 DIAGNOSIS — R4182 Altered mental status, unspecified: Secondary | ICD-10-CM | POA: Diagnosis not present

## 2016-09-08 DIAGNOSIS — N289 Disorder of kidney and ureter, unspecified: Secondary | ICD-10-CM | POA: Diagnosis not present

## 2016-09-08 DIAGNOSIS — Z6841 Body Mass Index (BMI) 40.0 and over, adult: Secondary | ICD-10-CM | POA: Diagnosis not present

## 2016-09-08 DIAGNOSIS — R0902 Hypoxemia: Secondary | ICD-10-CM | POA: Diagnosis not present

## 2016-09-08 DIAGNOSIS — R278 Other lack of coordination: Secondary | ICD-10-CM | POA: Diagnosis not present

## 2016-09-08 DIAGNOSIS — I5033 Acute on chronic diastolic (congestive) heart failure: Secondary | ICD-10-CM | POA: Diagnosis present

## 2016-09-08 DIAGNOSIS — N390 Urinary tract infection, site not specified: Secondary | ICD-10-CM | POA: Diagnosis not present

## 2016-09-08 DIAGNOSIS — E1159 Type 2 diabetes mellitus with other circulatory complications: Secondary | ICD-10-CM | POA: Diagnosis not present

## 2016-09-08 DIAGNOSIS — M1000 Idiopathic gout, unspecified site: Secondary | ICD-10-CM | POA: Diagnosis not present

## 2016-09-08 DIAGNOSIS — R4781 Slurred speech: Secondary | ICD-10-CM | POA: Diagnosis not present

## 2016-09-08 DIAGNOSIS — I509 Heart failure, unspecified: Secondary | ICD-10-CM | POA: Diagnosis not present

## 2016-09-08 DIAGNOSIS — E162 Hypoglycemia, unspecified: Secondary | ICD-10-CM | POA: Diagnosis not present

## 2016-09-08 DIAGNOSIS — E119 Type 2 diabetes mellitus without complications: Secondary | ICD-10-CM | POA: Diagnosis not present

## 2016-09-08 DIAGNOSIS — T502X5A Adverse effect of carbonic-anhydrase inhibitors, benzothiadiazides and other diuretics, initial encounter: Secondary | ICD-10-CM | POA: Diagnosis present

## 2016-09-08 DIAGNOSIS — B9689 Other specified bacterial agents as the cause of diseases classified elsewhere: Secondary | ICD-10-CM | POA: Diagnosis present

## 2016-09-08 DIAGNOSIS — I6789 Other cerebrovascular disease: Secondary | ICD-10-CM | POA: Diagnosis not present

## 2016-09-08 DIAGNOSIS — E662 Morbid (severe) obesity with alveolar hypoventilation: Secondary | ICD-10-CM | POA: Diagnosis present

## 2016-09-08 DIAGNOSIS — D649 Anemia, unspecified: Secondary | ICD-10-CM | POA: Diagnosis present

## 2016-09-08 DIAGNOSIS — M6281 Muscle weakness (generalized): Secondary | ICD-10-CM | POA: Diagnosis not present

## 2016-09-08 DIAGNOSIS — E1122 Type 2 diabetes mellitus with diabetic chronic kidney disease: Secondary | ICD-10-CM | POA: Diagnosis present

## 2016-09-08 DIAGNOSIS — N179 Acute kidney failure, unspecified: Secondary | ICD-10-CM | POA: Diagnosis not present

## 2016-09-08 DIAGNOSIS — J9602 Acute respiratory failure with hypercapnia: Secondary | ICD-10-CM | POA: Diagnosis not present

## 2016-09-08 DIAGNOSIS — Z1612 Extended spectrum beta lactamase (ESBL) resistance: Secondary | ICD-10-CM | POA: Diagnosis not present

## 2016-09-11 DIAGNOSIS — A498 Other bacterial infections of unspecified site: Secondary | ICD-10-CM | POA: Diagnosis not present

## 2016-09-11 DIAGNOSIS — N390 Urinary tract infection, site not specified: Secondary | ICD-10-CM | POA: Diagnosis not present

## 2016-09-11 DIAGNOSIS — Z1612 Extended spectrum beta lactamase (ESBL) resistance: Secondary | ICD-10-CM | POA: Diagnosis not present

## 2016-09-11 DIAGNOSIS — J189 Pneumonia, unspecified organism: Secondary | ICD-10-CM | POA: Diagnosis not present

## 2016-09-12 ENCOUNTER — Encounter (HOSPITAL_COMMUNITY): Payer: Self-pay | Admitting: Pharmacy Technician

## 2016-09-12 ENCOUNTER — Emergency Department (HOSPITAL_COMMUNITY): Payer: Medicare Other

## 2016-09-12 ENCOUNTER — Inpatient Hospital Stay (HOSPITAL_COMMUNITY)
Admission: EM | Admit: 2016-09-12 | Discharge: 2016-10-18 | DRG: 291 | Disposition: E | Payer: Medicare Other | Attending: Internal Medicine | Admitting: Internal Medicine

## 2016-09-12 DIAGNOSIS — J9600 Acute respiratory failure, unspecified whether with hypoxia or hypercapnia: Secondary | ICD-10-CM | POA: Diagnosis present

## 2016-09-12 DIAGNOSIS — R4182 Altered mental status, unspecified: Secondary | ICD-10-CM | POA: Diagnosis not present

## 2016-09-12 DIAGNOSIS — Z515 Encounter for palliative care: Secondary | ICD-10-CM | POA: Diagnosis not present

## 2016-09-12 DIAGNOSIS — E1122 Type 2 diabetes mellitus with diabetic chronic kidney disease: Secondary | ICD-10-CM | POA: Diagnosis present

## 2016-09-12 DIAGNOSIS — I472 Ventricular tachycardia: Secondary | ICD-10-CM | POA: Diagnosis not present

## 2016-09-12 DIAGNOSIS — Z66 Do not resuscitate: Secondary | ICD-10-CM | POA: Diagnosis not present

## 2016-09-12 DIAGNOSIS — T502X5A Adverse effect of carbonic-anhydrase inhibitors, benzothiadiazides and other diuretics, initial encounter: Secondary | ICD-10-CM | POA: Diagnosis present

## 2016-09-12 DIAGNOSIS — R944 Abnormal results of kidney function studies: Secondary | ICD-10-CM | POA: Diagnosis present

## 2016-09-12 DIAGNOSIS — Z90710 Acquired absence of both cervix and uterus: Secondary | ICD-10-CM

## 2016-09-12 DIAGNOSIS — N189 Chronic kidney disease, unspecified: Secondary | ICD-10-CM | POA: Diagnosis not present

## 2016-09-12 DIAGNOSIS — J9622 Acute and chronic respiratory failure with hypercapnia: Secondary | ICD-10-CM | POA: Diagnosis present

## 2016-09-12 DIAGNOSIS — Z794 Long term (current) use of insulin: Secondary | ICD-10-CM

## 2016-09-12 DIAGNOSIS — Z8249 Family history of ischemic heart disease and other diseases of the circulatory system: Secondary | ICD-10-CM

## 2016-09-12 DIAGNOSIS — R0600 Dyspnea, unspecified: Secondary | ICD-10-CM

## 2016-09-12 DIAGNOSIS — E872 Acidosis: Secondary | ICD-10-CM | POA: Diagnosis not present

## 2016-09-12 DIAGNOSIS — N289 Disorder of kidney and ureter, unspecified: Secondary | ICD-10-CM | POA: Diagnosis not present

## 2016-09-12 DIAGNOSIS — Z882 Allergy status to sulfonamides status: Secondary | ICD-10-CM

## 2016-09-12 DIAGNOSIS — J189 Pneumonia, unspecified organism: Secondary | ICD-10-CM

## 2016-09-12 DIAGNOSIS — I130 Hypertensive heart and chronic kidney disease with heart failure and stage 1 through stage 4 chronic kidney disease, or unspecified chronic kidney disease: Secondary | ICD-10-CM | POA: Diagnosis present

## 2016-09-12 DIAGNOSIS — Z79899 Other long term (current) drug therapy: Secondary | ICD-10-CM

## 2016-09-12 DIAGNOSIS — J9601 Acute respiratory failure with hypoxia: Secondary | ICD-10-CM | POA: Diagnosis not present

## 2016-09-12 DIAGNOSIS — B9689 Other specified bacterial agents as the cause of diseases classified elsewhere: Secondary | ICD-10-CM | POA: Diagnosis present

## 2016-09-12 DIAGNOSIS — I5031 Acute diastolic (congestive) heart failure: Secondary | ICD-10-CM | POA: Diagnosis not present

## 2016-09-12 DIAGNOSIS — I272 Pulmonary hypertension, unspecified: Secondary | ICD-10-CM | POA: Diagnosis present

## 2016-09-12 DIAGNOSIS — N390 Urinary tract infection, site not specified: Secondary | ICD-10-CM | POA: Diagnosis present

## 2016-09-12 DIAGNOSIS — Y95 Nosocomial condition: Secondary | ICD-10-CM | POA: Diagnosis present

## 2016-09-12 DIAGNOSIS — J9621 Acute and chronic respiratory failure with hypoxia: Secondary | ICD-10-CM | POA: Diagnosis present

## 2016-09-12 DIAGNOSIS — N179 Acute kidney failure, unspecified: Secondary | ICD-10-CM

## 2016-09-12 DIAGNOSIS — Z885 Allergy status to narcotic agent status: Secondary | ICD-10-CM

## 2016-09-12 DIAGNOSIS — G9341 Metabolic encephalopathy: Secondary | ICD-10-CM | POA: Diagnosis present

## 2016-09-12 DIAGNOSIS — I6789 Other cerebrovascular disease: Secondary | ICD-10-CM | POA: Diagnosis not present

## 2016-09-12 DIAGNOSIS — I509 Heart failure, unspecified: Secondary | ICD-10-CM | POA: Diagnosis not present

## 2016-09-12 DIAGNOSIS — R0609 Other forms of dyspnea: Secondary | ICD-10-CM | POA: Diagnosis not present

## 2016-09-12 DIAGNOSIS — Z8672 Personal history of thrombophlebitis: Secondary | ICD-10-CM

## 2016-09-12 DIAGNOSIS — R4781 Slurred speech: Secondary | ICD-10-CM | POA: Diagnosis not present

## 2016-09-12 DIAGNOSIS — D649 Anemia, unspecified: Secondary | ICD-10-CM | POA: Diagnosis present

## 2016-09-12 DIAGNOSIS — Z888 Allergy status to other drugs, medicaments and biological substances status: Secondary | ICD-10-CM

## 2016-09-12 DIAGNOSIS — N183 Chronic kidney disease, stage 3 (moderate): Secondary | ICD-10-CM | POA: Diagnosis present

## 2016-09-12 DIAGNOSIS — Z85038 Personal history of other malignant neoplasm of large intestine: Secondary | ICD-10-CM

## 2016-09-12 DIAGNOSIS — E875 Hyperkalemia: Secondary | ICD-10-CM | POA: Diagnosis present

## 2016-09-12 DIAGNOSIS — Z6841 Body Mass Index (BMI) 40.0 and over, adult: Secondary | ICD-10-CM

## 2016-09-12 DIAGNOSIS — D696 Thrombocytopenia, unspecified: Secondary | ICD-10-CM | POA: Diagnosis present

## 2016-09-12 DIAGNOSIS — R0902 Hypoxemia: Secondary | ICD-10-CM | POA: Diagnosis not present

## 2016-09-12 DIAGNOSIS — J90 Pleural effusion, not elsewhere classified: Secondary | ICD-10-CM | POA: Diagnosis not present

## 2016-09-12 DIAGNOSIS — Z89511 Acquired absence of right leg below knee: Secondary | ICD-10-CM | POA: Diagnosis not present

## 2016-09-12 DIAGNOSIS — Z7982 Long term (current) use of aspirin: Secondary | ICD-10-CM

## 2016-09-12 DIAGNOSIS — R9431 Abnormal electrocardiogram [ECG] [EKG]: Secondary | ICD-10-CM | POA: Diagnosis not present

## 2016-09-12 DIAGNOSIS — E662 Morbid (severe) obesity with alveolar hypoventilation: Secondary | ICD-10-CM | POA: Diagnosis present

## 2016-09-12 DIAGNOSIS — E11649 Type 2 diabetes mellitus with hypoglycemia without coma: Secondary | ICD-10-CM | POA: Diagnosis present

## 2016-09-12 DIAGNOSIS — Z9049 Acquired absence of other specified parts of digestive tract: Secondary | ICD-10-CM

## 2016-09-12 DIAGNOSIS — E1159 Type 2 diabetes mellitus with other circulatory complications: Secondary | ICD-10-CM | POA: Diagnosis not present

## 2016-09-12 DIAGNOSIS — J9602 Acute respiratory failure with hypercapnia: Secondary | ICD-10-CM | POA: Diagnosis not present

## 2016-09-12 DIAGNOSIS — I5033 Acute on chronic diastolic (congestive) heart failure: Secondary | ICD-10-CM | POA: Diagnosis present

## 2016-09-12 DIAGNOSIS — G92 Toxic encephalopathy: Secondary | ICD-10-CM | POA: Diagnosis not present

## 2016-09-12 LAB — BLOOD GAS, ARTERIAL
Acid-base deficit: 1.4 mmol/L (ref 0.0–2.0)
Bicarbonate: 25 mmol/L (ref 20.0–28.0)
Delivery systems: POSITIVE
Drawn by: 345601
EXPIRATORY PAP: 5
FIO2: 50
INSPIRATORY PAP: 15
O2 Saturation: 94 %
PH ART: 7.256 — AB (ref 7.350–7.450)
Patient temperature: 96.3
pCO2 arterial: 57.1 mmHg — ABNORMAL HIGH (ref 32.0–48.0)
pO2, Arterial: 73.5 mmHg — ABNORMAL LOW (ref 83.0–108.0)

## 2016-09-12 LAB — CREATININE, SERUM
CREATININE: 3.1 mg/dL — AB (ref 0.44–1.00)
GFR calc Af Amer: 16 mL/min — ABNORMAL LOW (ref >60)
GFR calc non Af Amer: 14 mL/min — ABNORMAL LOW (ref >60)

## 2016-09-12 LAB — CBC
HCT: 30.1 % — ABNORMAL LOW (ref 36.0–46.0)
HEMOGLOBIN: 9.1 g/dL — AB (ref 12.0–15.0)
MCH: 26.5 pg (ref 26.0–34.0)
MCHC: 30.2 g/dL (ref 30.0–36.0)
MCV: 87.5 fL (ref 78.0–100.0)
Platelets: 121 10*3/uL — ABNORMAL LOW (ref 150–400)
RBC: 3.44 MIL/uL — AB (ref 3.87–5.11)
RDW: 19.1 % — ABNORMAL HIGH (ref 11.5–15.5)
WBC: 8.2 10*3/uL (ref 4.0–10.5)

## 2016-09-12 LAB — I-STAT CHEM 8, ED
BUN: 81 mg/dL — ABNORMAL HIGH (ref 6–20)
CHLORIDE: 105 mmol/L (ref 101–111)
Calcium, Ion: 1.15 mmol/L (ref 1.15–1.40)
Creatinine, Ser: 3.1 mg/dL — ABNORMAL HIGH (ref 0.44–1.00)
GLUCOSE: 79 mg/dL (ref 65–99)
HCT: 30 % — ABNORMAL LOW (ref 36.0–46.0)
Hemoglobin: 10.2 g/dL — ABNORMAL LOW (ref 12.0–15.0)
POTASSIUM: 5.7 mmol/L — AB (ref 3.5–5.1)
Sodium: 139 mmol/L (ref 135–145)
TCO2: 28 mmol/L (ref 0–100)

## 2016-09-12 LAB — URINALYSIS, ROUTINE W REFLEX MICROSCOPIC
BACTERIA UA: NONE SEEN
Bilirubin Urine: NEGATIVE
Glucose, UA: NEGATIVE mg/dL
HGB URINE DIPSTICK: NEGATIVE
KETONES UR: NEGATIVE mg/dL
Leukocytes, UA: NEGATIVE
Nitrite: NEGATIVE
Protein, ur: 30 mg/dL — AB
Specific Gravity, Urine: 1.011 (ref 1.005–1.030)
Squamous Epithelial / LPF: NONE SEEN
pH: 5 (ref 5.0–8.0)

## 2016-09-12 LAB — CBC WITH DIFFERENTIAL/PLATELET
Basophils Absolute: 0 10*3/uL (ref 0.0–0.1)
Basophils Relative: 0 %
EOS PCT: 2 %
Eosinophils Absolute: 0.2 10*3/uL (ref 0.0–0.7)
HEMATOCRIT: 28.2 % — AB (ref 36.0–46.0)
Hemoglobin: 8.4 g/dL — ABNORMAL LOW (ref 12.0–15.0)
LYMPHS ABS: 1.1 10*3/uL (ref 0.7–4.0)
Lymphocytes Relative: 11 %
MCH: 26.1 pg (ref 26.0–34.0)
MCHC: 29.8 g/dL — AB (ref 30.0–36.0)
MCV: 87.6 fL (ref 78.0–100.0)
MONOS PCT: 3 %
Monocytes Absolute: 0.3 10*3/uL (ref 0.1–1.0)
NEUTROS ABS: 8.3 10*3/uL — AB (ref 1.7–7.7)
Neutrophils Relative %: 84 %
Platelets: 111 10*3/uL — ABNORMAL LOW (ref 150–400)
RBC: 3.22 MIL/uL — ABNORMAL LOW (ref 3.87–5.11)
RDW: 19.1 % — AB (ref 11.5–15.5)
WBC: 9.9 10*3/uL (ref 4.0–10.5)

## 2016-09-12 LAB — GLUCOSE, CAPILLARY
Glucose-Capillary: 65 mg/dL (ref 65–99)
Glucose-Capillary: 85 mg/dL (ref 65–99)

## 2016-09-12 LAB — PROCALCITONIN

## 2016-09-12 LAB — COMPREHENSIVE METABOLIC PANEL
ALT: 22 U/L (ref 14–54)
AST: 34 U/L (ref 15–41)
Albumin: 3 g/dL — ABNORMAL LOW (ref 3.5–5.0)
Alkaline Phosphatase: 84 U/L (ref 38–126)
Anion gap: 8 (ref 5–15)
BILIRUBIN TOTAL: 0.6 mg/dL (ref 0.3–1.2)
BUN: 66 mg/dL — ABNORMAL HIGH (ref 6–20)
CALCIUM: 8.9 mg/dL (ref 8.9–10.3)
CHLORIDE: 106 mmol/L (ref 101–111)
CO2: 25 mmol/L (ref 22–32)
CREATININE: 3.09 mg/dL — AB (ref 0.44–1.00)
GFR calc non Af Amer: 14 mL/min — ABNORMAL LOW (ref >60)
GFR, EST AFRICAN AMERICAN: 16 mL/min — AB (ref >60)
GLUCOSE: 79 mg/dL (ref 65–99)
Potassium: 5.7 mmol/L — ABNORMAL HIGH (ref 3.5–5.1)
Sodium: 139 mmol/L (ref 135–145)
Total Protein: 6.9 g/dL (ref 6.5–8.1)

## 2016-09-12 LAB — BASIC METABOLIC PANEL
ANION GAP: 10 (ref 5–15)
BUN: 65 mg/dL — ABNORMAL HIGH (ref 6–20)
CALCIUM: 8.6 mg/dL — AB (ref 8.9–10.3)
CHLORIDE: 106 mmol/L (ref 101–111)
CO2: 23 mmol/L (ref 22–32)
Creatinine, Ser: 3.15 mg/dL — ABNORMAL HIGH (ref 0.44–1.00)
GFR calc non Af Amer: 14 mL/min — ABNORMAL LOW (ref >60)
GFR, EST AFRICAN AMERICAN: 16 mL/min — AB (ref >60)
GLUCOSE: 77 mg/dL (ref 65–99)
POTASSIUM: 5.3 mmol/L — AB (ref 3.5–5.1)
Sodium: 139 mmol/L (ref 135–145)

## 2016-09-12 LAB — I-STAT ARTERIAL BLOOD GAS, ED
Acid-base deficit: 2 mmol/L (ref 0.0–2.0)
Bicarbonate: 26.1 mmol/L (ref 20.0–28.0)
O2 SAT: 89 %
PCO2 ART: 61.5 mmHg — AB (ref 32.0–48.0)
PO2 ART: 64 mmHg — AB (ref 83.0–108.0)
Patient temperature: 95.6
TCO2: 28 mmol/L (ref 0–100)
pH, Arterial: 7.226 — ABNORMAL LOW (ref 7.350–7.450)

## 2016-09-12 LAB — TROPONIN I
TROPONIN I: 0.03 ng/mL — AB (ref <0.03)
Troponin I: <0.03 ng/mL (ref <0.03)

## 2016-09-12 LAB — LACTIC ACID, PLASMA
LACTIC ACID, VENOUS: 0.7 mmol/L (ref 0.5–1.9)
Lactic Acid, Venous: 0.6 mmol/L (ref 0.5–1.9)

## 2016-09-12 LAB — I-STAT CG4 LACTIC ACID, ED: Lactic Acid, Venous: 0.39 mmol/L — ABNORMAL LOW (ref 0.5–1.9)

## 2016-09-12 LAB — CBG MONITORING, ED: Glucose-Capillary: 84 mg/dL (ref 65–99)

## 2016-09-12 LAB — MRSA PCR SCREENING: MRSA by PCR: NEGATIVE

## 2016-09-12 LAB — BRAIN NATRIURETIC PEPTIDE
B NATRIURETIC PEPTIDE 5: 531.2 pg/mL — AB (ref 0.0–100.0)
B NATRIURETIC PEPTIDE 5: 523.4 pg/mL — AB (ref 0.0–100.0)

## 2016-09-12 LAB — CORTISOL: CORTISOL PLASMA: 19.8 ug/dL

## 2016-09-12 MED ORDER — HEPARIN SODIUM (PORCINE) 5000 UNIT/ML IJ SOLN
5000.0000 [IU] | Freq: Three times a day (TID) | INTRAMUSCULAR | Status: DC
  Administered 2016-09-12 – 2016-09-16 (×10): 5000 [IU] via SUBCUTANEOUS
  Filled 2016-09-12 (×12): qty 1

## 2016-09-12 MED ORDER — FUROSEMIDE 10 MG/ML IJ SOLN
40.0000 mg | Freq: Two times a day (BID) | INTRAMUSCULAR | Status: DC
  Administered 2016-09-12 – 2016-09-14 (×4): 40 mg via INTRAVENOUS
  Filled 2016-09-12 (×4): qty 4

## 2016-09-12 MED ORDER — INSULIN ASPART 100 UNIT/ML ~~LOC~~ SOLN: 0.0000 [IU] | SUBCUTANEOUS | Status: DC

## 2016-09-12 MED ORDER — DEXTROSE 5 % IV SOLN
2.0000 g | Freq: Once | INTRAVENOUS | Status: AC
  Administered 2016-09-12: 2 g via INTRAVENOUS
  Filled 2016-09-12: qty 2

## 2016-09-12 MED ORDER — SODIUM CHLORIDE 0.9 % IV BOLUS (SEPSIS)
500.0000 mL | Freq: Once | INTRAVENOUS | Status: AC
  Administered 2016-09-12: 500 mL via INTRAVENOUS

## 2016-09-12 MED ORDER — DEXTROSE 50 % IV SOLN
INTRAVENOUS | Status: AC
  Administered 2016-09-12: 25 mL
  Filled 2016-09-12: qty 50

## 2016-09-12 MED ORDER — VANCOMYCIN HCL 10 G IV SOLR
2000.0000 mg | Freq: Once | INTRAVENOUS | Status: AC
  Administered 2016-09-12: 2000 mg via INTRAVENOUS
  Filled 2016-09-12: qty 2000

## 2016-09-12 MED ORDER — SODIUM CHLORIDE 0.9 % IV SOLN: 250.0000 mL | INTRAVENOUS | Status: DC | PRN

## 2016-09-12 MED ORDER — ALBUTEROL SULFATE (2.5 MG/3ML) 0.083% IN NEBU
10.0000 mg | INHALATION_SOLUTION | Freq: Once | RESPIRATORY_TRACT | Status: AC
  Administered 2016-09-12: 10 mg via RESPIRATORY_TRACT
  Filled 2016-09-12: qty 12

## 2016-09-12 NOTE — Progress Notes (Signed)
Pharmacy Antibiotic Note  [name] is a 71 y.o. female admitted on 09/15/2016 from nursing home with somnolence and weakness. Starting empiric abx, pt noted to have AKI.    Plan: -Cefepime 2 g IV x1 -Vancomycin 2 g IV x1 -Monitor renal fx, cultures, VR as needed   Temp (24hrs), Avg:95.6 F (35.3 C), Min:95.6 F (35.3 C), Max:95.6 F (35.3 C)   Recent Labs Lab 09/07/2016 1632 08/29/2016 1650  WBC  --  9.9  CREATININE 3.10* 3.09*    CrCl cannot be calculated (Unknown ideal weight.).    Allergies  Allergen Reactions  . Aldactone [Spironolactone] Other (See Comments)    ARF  . Bactrim [Sulfamethoxazole-Trimethoprim] Hives  . Codeine Other (See Comments)    Causes confusion  . Meperidine Hcl Other (See Comments)    Increase in heart rate (demerol)  . Metformin Nausea And Vomiting    Antimicrobials this admission: 2/24 cefepime > 2/24 vancomycin >  Dose adjustments this admission: N/A  Microbiology results: 2/24 blood cx:  Thank you for allowing pharmacy to be a part of this patient's care.  [name] 09/15/2016 6:15 PM

## 2016-09-12 NOTE — Progress Notes (Signed)
Patient tolerating BIPAP well at this time. RT will continue to monitor.  

## 2016-09-12 NOTE — ED Triage Notes (Signed)
Pt arrives via [facility] from [facility] home with reports of somnolence and weakness. Per ems staff at [facility] stated pt was not her normal self. LKW 2030 yesterday. Pt normally walks with her prosthetics and today would not get up. Per ems family states change in lasix recently and recently dx with double PNA and UTI.

## 2016-09-12 NOTE — ED Notes (Signed)
Warm blankets applied

## 2016-09-12 NOTE — ED Notes (Signed)
Dr Alvino Chapel given a copy of lactic acid results .[place]

## 2016-09-12 NOTE — ED Notes (Signed)
Family at bedside. 

## 2016-09-12 NOTE — ED Provider Notes (Signed)
[facility] DEPT Provider Note   CSN: [id number] Arrival date & time: 08/22/2016  1541     History   Chief Complaint Chief Complaint  Patient presents with  . Altered Mental Status   Level V caveat due to dyspnea and slurred speech. HPI [name] is a 71 y.o. female.  HPI Patient presents with altered mental status and hypoxia. Sent in from nursing home. Reportedly had sats of 68%. Reportedly was normal last night and then in the middle the night had called out for help. Reportedly was also normal earlier today. Recent admission to the hospital for urinary tract infection and double pneumonia. Also reportedly had some renal failure. Has history of CHF. Previous invitations bilateral lower legs. Normally can get her on our prosthetics but now is been able to get out of bed.   Past Medical History:  Diagnosis Date  . Anxiety   . Arthritis   . Cancer ([place])    hx of colon CA 2000  . Carotid stenosis    u/s 6/10: R 0-39%Ll 40-59%;  u/s 7/12: 40-59% bilateral (repeat in 7/13)  . CHF (congestive heart failure) (HCC)    hx of EF 15-20% in 4/08,  echo 3/09 EF 60-65%;   echo 6/12: EF 55-60%, mild LAE  . Complication of anesthesia    Oxygen drops   . DM (diabetes mellitus) ([place])   . Family history of anesthesia complication    Mother N/V  . Head injury, acute, without loss of consciousness   . History of blood transfusion   . HTN (hypertension)   . Hypokalemia   . Morbid obesity ([place])   . Obesity hypoventilation syndrome ([place])   . OSA (obstructive sleep apnea)   . 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 PONV (postoperative nausea and vomiting)    after colon surgery 2000  . Pulmonary HTN    mild,  cath 6/08 with PVR 5.1  . Spinal stenosis   . Thrombophlebitis of deep vein of leg [facility])     Patient Active Problem List   Diagnosis Date Noted  . Acute respiratory failure ([place]) 09/01/2016  . Chronic respiratory failure ([place]) 05/16/2015  .  S/P BKA (below knee amputation) bilateral ([place]) 05/04/2014  . Normocytic anemia 06/17/2013  . Thrombocytopenia, unspecified 06/17/2013  . Leukocytosis, unspecified 06/17/2013  . Osteomyelitis of foot, left, acute ([place]) 06/16/2013  . Osteomyelitis of ankle or foot ([place]) 06/16/2013  . Fatigue 01/17/2013  . Gout attack 12/15/2012  . Acute kidney failure ([place]) 09/10/2012  . Hyperkalemia 09/10/2012  . Acute on chronic renal failure ([place]) 04/27/2012  . UTI (urinary tract infection) 04/22/2012  . Cardiorenal syndrome 04/22/2012  . Hypokalemia 04/22/2012  . Hypertension 09/21/2011  . Myositis 04/22/2011  . Denervation atrophy of muscle 04/22/2011  . Diabetic foot ulcer ([place]) 04/08/2011  . Non-pressure chronic ulcer of right ankle, limited to breakdown of skin ([place]) 04/08/2011  . Chronic diastolic heart failure ([place]) 03/17/2011  . Acute on chronic diastolic heart failure ([place]) 03/11/2011  . Cough 03/03/2011  . Cellulitis 02/02/2011  . Edema 12/29/2010  . PALPITATIONS 04/22/2010  . ABNORMAL HEART RHYTHMS 03/14/2010  . Carotid artery stenosis 12/24/2009  . PULMONARY HYPERTENSION, SECONDARY 01/09/2009  . CARDIOMYOPATHY, PRIMARY, DILATED 01/09/2009  . CAROTID BRUIT 01/09/2009  . Pure hypercholesterolemia 12/31/2008  . Obesity hypoventilation syndrome ([place]) 10/31/2007  . DM 05/05/2007  . Obstructive sleep apnea 05/05/2007  . HYPERTENSION 05/05/2007  . CONGESTIVE HEART FAILURE  05/05/2007  . DEEP VENOUS THROMBOPHLEBITIS 05/05/2007    Past Surgical History:  Procedure Laterality Date  . AMPUTATION Left 10/07/2012   Procedure: AMPUTATION RAY;  Surgeon: Newt Minion, MD;  Location: [facility];  Service: Orthopedics;  Laterality: Left;  Left foot 1st Ray Amputation  . AMPUTATION Left 06/18/2013   Procedure: AMPUTATION BELOW KNEE;  Surgeon: Newt Minion, MD;  Location: [facility];  Service: Orthopedics;  Laterality: Left;  . AMPUTATION Right 05/04/2014   Procedure: Right Below Knee Amputation;   Surgeon: Newt Minion, MD;  Location: [place];  Service: Orthopedics;  Laterality: Right;  . CARPAL TUNNEL RELEASE Left 07/2007  . CHOLECYSTECTOMY  2002  . Colon Cancer with resection  2000  . COLONOSCOPY    . EYE SURGERY Bilateral    bilateral cataracts   . VAGINAL HYSTERECTOMY  1983    OB History    No data available       Home Medications    Prior to Admission medications   Medication Sig Start Date End Date Taking? Authorizing Provider  acetaminophen (TYLENOL) 500 MG tablet Take 500 mg by mouth every 6 (six) hours as needed for mild pain.     Historical Provider, MD  allopurinol (ZYLOPRIM) 300 MG tablet Take 300 mg by mouth daily.    Historical Provider, MD  amLODipine (NORVASC) 5 MG tablet Take 5 mg by mouth at bedtime.     Historical Provider, MD  aspirin (BAYER ASPIRIN) 325 MG tablet Take 325 mg by mouth daily.      Historical Provider, MD  carvedilol (COREG) 3.125 MG tablet Take 3.125 mg by mouth 2 (two) times daily with a meal.     Historical Provider, MD  docusate sodium (COLACE) 100 MG capsule Take 100 mg by mouth 2 (two) times daily.    Historical Provider, MD  ferrous sulfate 325 (65 FE) MG tablet Take 325 mg by mouth daily with breakfast.    Historical Provider, MD  insulin lispro (HUMALOG) 100 UNIT/ML injection Inject 2-4 Units into the skin 3 (three) times daily before meals. Sliding Scale   150-200 2 units , add an additional 2 units for each 50 increment increase in blood sugar    Historical Provider, MD  LEVEMIR 100 UNIT/ML injection Inject 50-55 Units into the skin 2 (two) times daily. 55 units in the morning and 50 units at bedtime  as directed 12/01/14   Historical Provider, MD  potassium chloride SA (K-DUR,KLOR-CON) 20 MEQ tablet Take 20 mEq by mouth daily. 01/11/13   Amy D Ninfa Meeker, NP  pravastatin (PRAVACHOL) 40 MG tablet Take 1 tablet by mouth daily.    Historical Provider, MD  sertraline (ZOLOFT) 50 MG tablet Take 50 mg by mouth daily. In the evening 07/23/15    Historical Provider, MD  torsemide (DEMADEX) 20 MG tablet Take 60 mg by mouth every morning.     Historical Provider, MD  Vitamin D, Ergocalciferol, (DRISDOL) 50000 UNITS CAPS Take 50,000 Units by mouth 2 (two) times a week. Take on Mon and Thurs.    Historical Provider, MD    Family History Family History  Problem Relation Age of Onset  . Coronary artery disease Father     Social History Social History  Substance Use Topics  . Smoking status: Never Smoker  . Smokeless tobacco: Never Used  . Alcohol use No     Allergies   Aldactone [spironolactone]; Bactrim [sulfamethoxazole-trimethoprim]; Codeine; Meperidine hcl; and Metformin   Review of Systems Review of  Systems  Unable to perform ROS: Mental status change     Physical Exam Updated Vital Signs BP 142/69   Pulse (!) 54   Temp (!) 95.6 F (35.3 C) (Rectal)   Resp 20   SpO2 98%   Physical Exam  Constitutional: She appears well-developed.  HENT:  Head: Normocephalic.  Eyes: Pupils are equal, round, and reactive to light.  Cardiovascular: Normal rate.   Pulmonary/Chest:  Some rales bilateral bases.  Abdominal: Soft. There is no tenderness.  Musculoskeletal:  Previous bilateral below-the-knee amputations.  Neurological:  Patient is awake but somewhat difficult to understand. Will follow commands. Will help her sit there with her eyes closed when not discussing with patient.  Skin: Skin is warm. Capillary refill takes less than 2 seconds.     ED Treatments / Results  Labs (all labs ordered are listed, but only abnormal results are displayed) Labs Reviewed  CBC WITH DIFFERENTIAL/PLATELET - Abnormal; Notable for the following:       Result Value   RBC 3.22 (*)    Hemoglobin 8.4 (*)    HCT 28.2 (*)    MCHC 29.8 (*)    RDW 19.1 (*)    Platelets 111 (*)    Neutro Abs 8.3 (*)    All other components within normal limits  COMPREHENSIVE METABOLIC PANEL - Abnormal; Notable for the following:    Potassium 5.7  (*)    BUN 66 (*)    Creatinine, Ser 3.09 (*)    Albumin 3.0 (*)    GFR calc non Af Amer 14 (*)    GFR calc Af Amer 16 (*)    All other components within normal limits  URINALYSIS, ROUTINE W REFLEX MICROSCOPIC - Abnormal; Notable for the following:    Protein, ur 30 (*)    All other components within normal limits  BRAIN NATRIURETIC PEPTIDE - Abnormal; Notable for the following:    B Natriuretic Peptide 531.2 (*)    All other components within normal limits  I-STAT CHEM 8, ED - Abnormal; Notable for the following:    Potassium 5.7 (*)    BUN 81 (*)    Creatinine, Ser 3.10 (*)    Hemoglobin 10.2 (*)    HCT 30.0 (*)    All other components within normal limits  I-STAT ARTERIAL BLOOD GAS, ED - Abnormal; Notable for the following:    pH, Arterial 7.226 (*)    pCO2 arterial 61.5 (*)    pO2, Arterial 64.0 (*)    All other components within normal limits  I-STAT CG4 LACTIC ACID, ED - Abnormal; Notable for the following:    Lactic Acid, Venous 0.39 (*)    All other components within normal limits  CULTURE, BLOOD (ROUTINE X 2)  CULTURE, BLOOD (ROUTINE X 2)  URINE CULTURE  CULTURE, RESPIRATORY (NON-EXPECTORATED)  RESPIRATORY PANEL BY PCR  TROPONIN I  BASIC METABOLIC PANEL  CBC  CREATININE, SERUM  TROPONIN I  TROPONIN I  TROPONIN I  LACTIC ACID, PLASMA  LACTIC ACID, PLASMA  PROCALCITONIN  BRAIN NATRIURETIC PEPTIDE  CORTISOL  URINALYSIS, ROUTINE W REFLEX MICROSCOPIC  STREP PNEUMONIAE URINARY ANTIGEN  LEGIONELLA PNEUMOPHILA SEROGP 1 UR AG  BLOOD GAS, ARTERIAL  CBC  BASIC METABOLIC PANEL  BLOOD GAS, ARTERIAL  MAGNESIUM  PHOSPHORUS  INFLUENZA PANEL BY PCR (TYPE A & B)  BASIC METABOLIC PANEL  CBG MONITORING, ED    EKG  EKG Interpretation  Date/Time:  Saturday September 12 2016 15:48:04 EST Ventricular Rate:  55 PR  Interval:    QRS Duration: 131 QT Interval:  512 QTC Calculation: 490 R Axis:   168 Text Interpretation:  Junctional rhythm vs sinus with 1st degree  block Nonspecific intraventricular conduction delay Anteroseptal infarct, old Confirmed by Alvino Chapel  MD, Royale Swamy [phone]) on 08/23/2016 4:37:12 PM       Radiology Dg Chest Portable 1 View  Result Date: 08/30/2016 CLINICAL DATA:  Reason for exam: AMS, hypoxia. Medical hx: cancer, CHF, diabetes, HTN. EXAM: PORTABLE CHEST 1 VIEW COMPARISON:  09/07/2006 FINDINGS: There is mild enlargement of the cardiopericardial silhouette. No convincing mediastinal or hilar masses. Opacity at the left lung base obscuring hemidiaphragm and most of the left heart border is stable. Opacity on the right extends from the right perihilar region to the right lung base, obscuring hemidiaphragm. This appears increased from the prior exam, although the difference may be due to the current semi-erect, rotated positioning compared to the prior exam. There is no convincing pulmonary edema.  No pneumothorax. Skeletal structures are demineralized but grossly intact. IMPRESSION: 1. Moderate bilateral pleural effusions, greater on the left. There is associated lung base opacity that is likely atelectasis. Opacity on the left is stable from the prior exam. Opacity on the right may be increased although the difference may be positional only. 2. No convincing residual pulmonary edema. Electronically Signed   By: Lajean Manes M.D.   On: 08/27/2016 16:46    Procedures Procedures (including critical care time)  Medications Ordered in ED Medications  vancomycin (VANCOCIN) 2,000 mg in sodium chloride 0.9 % 500 mL IVPB (2,000 mg Intravenous New Bag/Given 09/16/2016 1929)  0.9 %  sodium chloride infusion (not administered)  heparin injection 5,000 Units (not administered)  furosemide (LASIX) injection 40 mg (not administered)  insulin aspart (novoLOG) injection 0-15 Units (not administered)  albuterol (PROVENTIL) (2.5 MG/3ML) 0.083% nebulizer solution 10 mg (10 mg Nebulization Given 08/23/2016 1646)  sodium chloride 0.9 % bolus 500 mL (0 mLs  Intravenous Stopped 08/24/2016 1919)  ceFEPIme (MAXIPIME) 2 g in dextrose 5 % 50 mL IVPB (2 g Intravenous New Bag/Given 08/20/2016 1926)     Initial Impression / Assessment and Plan / ED Course  I have reviewed the triage vital signs and the nursing notes.  Pertinent labs & imaging results that were available during my care of the patient were reviewed by me and considered in my medical decision making (see chart for details).     Patient presents with altered mental status. Hypothermia. Found to have worsening renal function. Has some swelling on her legs. X-ray did not show CHF but did show bilateral pleural effusions. Hypoxic and started on BiPAP. Admit to ICU. Normal white count and normal lactic acid. Empirically started on cefepime and vancomycin but not clearly septic especially with normal lactic acid.  CRITICAL CARE Performed by: Mackie Pai Total critical care time: 30 minutes Critical care time was exclusive of separately billable procedures and treating other patients. Critical care was necessary to treat or prevent imminent or life-threatening deterioration. Critical care was time spent personally by me on the following activities: development of treatment plan with patient and/or surrogate as well as nursing, discussions with consultants, evaluation of patient's response to treatment, examination of patient, obtaining history from patient or surrogate, ordering and performing treatments and interventions, ordering and review of laboratory studies, ordering and review of radiographic studies, pulse oximetry and re-evaluation of patient's condition.   Final Clinical Impressions(s) / ED Diagnoses   Final diagnoses:  Acute respiratory failure, unspecified whether  with hypoxia or hypercapnia [facility])  Renal insufficiency    New Prescriptions New Prescriptions   No medications on file     Davonna Belling, MD 08/28/2016 2039

## 2016-09-12 NOTE — ED Notes (Signed)
CBG 84. 

## 2016-09-12 NOTE — Progress Notes (Signed)
CRITICAL VALUE ALERT  Critical value received:  Troponin  0.03  Date of notification:  09/10/2016   Time of notification:  2150  Critical value read back:Yes.    Nurse who received alert:  York Pellant RN  MD notified (1st page):  Dr. Oletta Darter  Time of first page: 13  MD notified (2nd page):  NA  Time of second page:  NA  Responding MD:  Dr. Oletta Darter  Time MD responded: 2200

## 2016-09-12 NOTE — Progress Notes (Signed)
Patient transported to [id number] without complication.

## 2016-09-12 NOTE — H&P (Addendum)
PULMONARY / CRITICAL CARE MEDICINE   Name: [name] MRN: [id number] DOB: December 31, 1945    ADMISSION DATE:  09/14/2016 CONSULTATION DATE:  09/01/2016  REFERRING MD:  Dr. Alvino Chapel, ED  CHIEF COMPLAINT:  Acute hypoxic, hypercarbic respiratory failure  HISTORY OF PRESENT ILLNESS:   71 year old with past medical history of CHF, diabetes, OSA/OHS, pulmonary hypertension. Sent from the nursing home with hypoxia, sats and 68%. Patient was apparently normal yesterday. Workup in the ED significant for ABG 7.22/61/60/89%. Creatinine of 3.09 up from baseline of 1.8, K 5.7, hemoglobin 8.4, platelets 111. LA is normal at 0.3.  She has recent admission at [facility] for double pneumonia and UTI. Discharged on February 14 to a skilled nursing facility. The family notes increasing stump edema. She had been unable to put on her prosthesis due to this. She also noted to have increase in weight by 20 lbs over the past few weeks. Her nursing home switched lasix to torsemide recently to help with this.  PAST MEDICAL HISTORY :  She  has a past medical history of Anxiety; Arthritis; Cancer ([place]); Carotid stenosis; CHF (congestive heart failure) ([place]); Complication of anesthesia; DM (diabetes mellitus) ([place]); Family history of anesthesia complication; Head injury, acute, without loss of consciousness; History of blood transfusion; HTN (hypertension); Hypokalemia; Morbid obesity ([place]); Obesity hypoventilation syndrome ([place]); OSA (obstructive sleep apnea); Osteomyelitis of toe ([place]); PONV (postoperative nausea and vomiting); Pulmonary HTN; Spinal stenosis; and Thrombophlebitis of deep vein of leg ([place]).  PAST SURGICAL HISTORY: She  has a past surgical history that includes Carpal tunnel release (Left, 07/2007); Colon Cancer with resection (2000); Cholecystectomy (2002); Vaginal hysterectomy (1983); Eye surgery (Bilateral); Amputation (Left, 10/07/2012); Amputation (Left, 06/18/2013); Colonoscopy; and Amputation  (Right, 05/04/2014).  Allergies  Allergen Reactions  . Aldactone [Spironolactone] Other (See Comments)    ARF  . Bactrim [Sulfamethoxazole-Trimethoprim] Hives  . Codeine Other (See Comments)    Causes confusion  . Meperidine Hcl Other (See Comments)    Increase in heart rate (demerol)  . Metformin Nausea And Vomiting    No current facility-administered medications on file prior to encounter.    Current Outpatient Prescriptions on File Prior to Encounter  Medication Sig  . acetaminophen (TYLENOL) 500 MG tablet Take 500 mg by mouth every 6 (six) hours as needed for mild pain.   Marland Kitchen allopurinol (ZYLOPRIM) 300 MG tablet Take 300 mg by mouth daily.  Marland Kitchen amLODipine (NORVASC) 5 MG tablet Take 5 mg by mouth at bedtime.   Marland Kitchen aspirin (BAYER ASPIRIN) 325 MG tablet Take 325 mg by mouth daily.    . carvedilol (COREG) 3.125 MG tablet Take 3.125 mg by mouth 2 (two) times daily with a meal.   . docusate sodium (COLACE) 100 MG capsule Take 100 mg by mouth 2 (two) times daily.  . ferrous sulfate 325 (65 FE) MG tablet Take 325 mg by mouth daily with breakfast.  . insulin lispro (HUMALOG) 100 UNIT/ML injection Inject 2-4 Units into the skin 3 (three) times daily before meals. Sliding Scale   150-200 2 units , add an additional 2 units for each 50 increment increase in blood sugar  . LEVEMIR 100 UNIT/ML injection Inject 50-55 Units into the skin 2 (two) times daily. 55 units in the morning and 50 units at bedtime  as directed  . potassium chloride SA (K-DUR,KLOR-CON) 20 MEQ tablet Take 20 mEq by mouth daily.  . pravastatin (PRAVACHOL) 40 MG tablet Take 1 tablet by mouth daily.  . sertraline (ZOLOFT) 50 MG tablet  Take 50 mg by mouth daily. In the evening  . torsemide (DEMADEX) 20 MG tablet Take 60 mg by mouth every morning.   . Vitamin D, Ergocalciferol, (DRISDOL) 50000 UNITS CAPS Take 50,000 Units by mouth 2 (two) times a week. Take on Mon and Thurs.    FAMILY HISTORY:  Her indicated that the status of  her father is unknown.   SOCIAL HISTORY: She  reports that she has never smoked. She has never used smokeless tobacco. She reports that she does not drink alcohol or use drugs.  REVIEW OF SYSTEMS:   Unable to obtain as patient is on BiPAP  SUBJECTIVE:   VITAL SIGNS: BP 159/89 (BP Location: Right Arm)   Pulse (!) 54   Temp (!) 95.6 F (35.3 C) (Rectal)   Resp 14   SpO2 98%   HEMODYNAMICS:   VENTILATOR SETTINGS: Vent Mode: BIPAP;PCV FiO2 (%):  [60 %] 60 % Set Rate:  [15 bmp] 15 bmp PEEP:  [5 cmH20] 5 cmH20  INTAKE / OUTPUT: No intake/output data recorded.  PHYSICAL EXAMINATION: General:  Awake, interactive. No distress Neuro:  No focal deficits HEENT:  Mild elevation in JVD, no thyromegaly, moist mucous membranes Cardiovascular:  Regular rate and rhythm, no murmurs rubs gallops Lungs:  Clear to auscultation, no wheeze, crackles Abdomen:  Soft, positive bowel sounds Musculoskeletal:  Bilateral BKA Skin:  Intact  LABS:  BMET  Recent Labs Lab 08/21/2016 1632 09/02/2016 1650  NA 139 139  K 5.7* 5.7*  CL 105 106  CO2  --  25  BUN 81* 66*  CREATININE 3.10* 3.09*  GLUCOSE 79 79    Electrolytes  Recent Labs Lab 09/11/2016 1650  CALCIUM 8.9    CBC  Recent Labs Lab 08/24/2016 1632 08/24/2016 1650  WBC  --  9.9  HGB 10.2* 8.4*  HCT 30.0* 28.2*  PLT  --  111*    Coag's No results for input(s): APTT, INR in the last 168 hours.  Sepsis Markers  Recent Labs Lab 08/31/2016 1815  LATICACIDVEN 0.39*    ABG  Recent Labs Lab 09/11/2016 1719  PHART 7.226*  PCO2ART 61.5*  PO2ART 64.0*    Liver Enzymes  Recent Labs Lab 09/03/2016 1650  AST 34  ALT 22  ALKPHOS 84  BILITOT 0.6  ALBUMIN 3.0*    Cardiac Enzymes  Recent Labs Lab 08/26/2016 1650  TROPONINI <0.03    Glucose  Recent Labs Lab 09/04/2016 1553  GLUCAP 84    Imaging Dg Chest Portable 1 View  Result Date: 09/04/2016 CLINICAL DATA:  Reason for exam: AMS, hypoxia. Medical hx:  cancer, CHF, diabetes, HTN. EXAM: PORTABLE CHEST 1 VIEW COMPARISON:  09/07/2006 FINDINGS: There is mild enlargement of the cardiopericardial silhouette. No convincing mediastinal or hilar masses. Opacity at the left lung base obscuring hemidiaphragm and most of the left heart border is stable. Opacity on the right extends from the right perihilar region to the right lung base, obscuring hemidiaphragm. This appears increased from the prior exam, although the difference may be due to the current semi-erect, rotated positioning compared to the prior exam. There is no convincing pulmonary edema.  No pneumothorax. Skeletal structures are demineralized but grossly intact. IMPRESSION: 1. Moderate bilateral pleural effusions, greater on the left. There is associated lung base opacity that is likely atelectasis. Opacity on the left is stable from the prior exam. Opacity on the right may be increased although the difference may be positional only. 2. No convincing residual pulmonary edema. Electronically Signed  By: Lajean Manes M.D.   On: 09/10/2016 16:46     STUDIES:    CULTURES: Bcx 2/24 >   ANTIBIOTICS: Vanco 2/24 > Cefepime 2/34>  SIGNIFICANT EVENTS:   LINES/TUBES:   DISCUSSION: 71 year old with acute on chronic hypoxic and hypercarbic respiratory failure. Recent admission for UTI and pneumonia at [facility].  She may have recurrence of pneumonia, HCAP, sepsis. However she is hemodynamically stable and lactic acid is normal. Suspect there is a component of volume overload given her weight gain and stump swelling.  ASSESSMENT / PLAN:  PULMONARY A: Acute on chronic hypercarbic, hypoxemic respiratory failure OSA/OHS on BiPAP P:   Continue BiPAP for now Repeat ABG in 2 hours Continue antibiotics as above. Continue flu and respiratory virus panel  CARDIOVASCULAR A:  Volume overload H/O Pulmonary HTN, Diastolic heart failure P:  Gentle diuresis. Lasix 40 bid Follow Tni, BNP Echo in  AM  RENAL A:   AKI on CKD Hyperkalemia P:   Monitor urine output and cr with diuresis. Suspect her K will improve with lasix. Repeat BMP  GASTROINTESTINAL A:   Stable P:   Keep NPO while on Bipap  HEMATOLOGIC A:   Mild anemia, thrombocytopenia WBC is normal P:  Follow CBC  INFECTIOUS A:   Cover for HCAP R/O Flu P:   Continue vanco, cefepime Follow cultures, Pct  ENDOCRINE A:   DM P:   SSI coverage.  NEUROLOGIC A:   Encephalopathy. Likely secondary to hypercarbia P:   Limit sedating meds  FAMILY  - Updates: Daughter updated at bedside. Patient is DNR/DNI. I spok with the patient and she confirms this. - Inter-disciplinary family meet or Palliative Care meeting due by: 3/3  The patient is critically ill with multiple organ system failure and requires high complexity decision making for assessment and support, frequent evaluation and titration of therapies, advanced monitoring, review of radiographic studies and interpretation of complex data.   Critical Care Time devoted to patient care services, exclusive of separately billable procedures, described in this note is 45 minutes.   Marshell Garfinkel MD [facility] Pager [phone] If no answer or after 3pm call: [phone] 08/21/2016, 7:28 PM

## 2016-09-13 ENCOUNTER — Inpatient Hospital Stay (HOSPITAL_COMMUNITY): Payer: Medicare Other

## 2016-09-13 LAB — RESPIRATORY PANEL BY PCR
Adenovirus: NOT DETECTED
BORDETELLA PERTUSSIS-RVPCR: NOT DETECTED
CORONAVIRUS 229E-RVPPCR: NOT DETECTED
CORONAVIRUS OC43-RVPPCR: NOT DETECTED
Chlamydophila pneumoniae: NOT DETECTED
Coronavirus HKU1: NOT DETECTED
Coronavirus NL63: NOT DETECTED
INFLUENZA B-RVPPCR: NOT DETECTED
Influenza A: NOT DETECTED
METAPNEUMOVIRUS-RVPPCR: NOT DETECTED
MYCOPLASMA PNEUMONIAE-RVPPCR: NOT DETECTED
PARAINFLUENZA VIRUS 1-RVPPCR: NOT DETECTED
PARAINFLUENZA VIRUS 2-RVPPCR: NOT DETECTED
PARAINFLUENZA VIRUS 4-RVPPCR: NOT DETECTED
Parainfluenza Virus 3: NOT DETECTED
Respiratory Syncytial Virus: NOT DETECTED
Rhinovirus / Enterovirus: NOT DETECTED

## 2016-09-13 LAB — GLUCOSE, CAPILLARY
GLUCOSE-CAPILLARY: 85 mg/dL (ref 65–99)
Glucose-Capillary: 105 mg/dL — ABNORMAL HIGH (ref 65–99)
Glucose-Capillary: 142 mg/dL — ABNORMAL HIGH (ref 65–99)
Glucose-Capillary: 58 mg/dL — ABNORMAL LOW (ref 65–99)
Glucose-Capillary: 60 mg/dL — ABNORMAL LOW (ref 65–99)
Glucose-Capillary: 73 mg/dL (ref 65–99)
Glucose-Capillary: 88 mg/dL (ref 65–99)
Glucose-Capillary: 91 mg/dL (ref 65–99)
Glucose-Capillary: 97 mg/dL (ref 65–99)

## 2016-09-13 LAB — BASIC METABOLIC PANEL
ANION GAP: 11 (ref 5–15)
ANION GAP: 9 (ref 5–15)
BUN: 64 mg/dL — ABNORMAL HIGH (ref 6–20)
BUN: 66 mg/dL — ABNORMAL HIGH (ref 6–20)
CALCIUM: 8.7 mg/dL — AB (ref 8.9–10.3)
CALCIUM: 8.7 mg/dL — AB (ref 8.9–10.3)
CO2: 24 mmol/L (ref 22–32)
CO2: 24 mmol/L (ref 22–32)
CREATININE: 3.12 mg/dL — AB (ref 0.44–1.00)
Chloride: 103 mmol/L (ref 101–111)
Chloride: 105 mmol/L (ref 101–111)
Creatinine, Ser: 3.01 mg/dL — ABNORMAL HIGH (ref 0.44–1.00)
GFR calc Af Amer: 17 mL/min — ABNORMAL LOW (ref >60)
GFR, EST AFRICAN AMERICAN: 16 mL/min — AB (ref >60)
GFR, EST NON AFRICAN AMERICAN: 14 mL/min — AB (ref >60)
GFR, EST NON AFRICAN AMERICAN: 14 mL/min — AB (ref >60)
Glucose, Bld: 73 mg/dL (ref 65–99)
Glucose, Bld: 67 mg/dL (ref 65–99)
POTASSIUM: 5 mmol/L (ref 3.5–5.1)
Potassium: 5.1 mmol/L (ref 3.5–5.1)
SODIUM: 138 mmol/L (ref 135–145)
Sodium: 138 mmol/L (ref 135–145)

## 2016-09-13 LAB — POCT I-STAT 3, ART BLOOD GAS (G3+)
Acid-base deficit: 3 mmol/L — ABNORMAL HIGH (ref 0.0–2.0)
Bicarbonate: 25 mmol/L (ref 20.0–28.0)
O2 Saturation: 77 %
TCO2: 27 mmol/L (ref 0–100)
pCO2 arterial: 54.7 mmHg — ABNORMAL HIGH (ref 32.0–48.0)
pH, Arterial: 7.264 — ABNORMAL LOW (ref 7.350–7.450)
pO2, Arterial: 47 mmHg — ABNORMAL LOW (ref 83.0–108.0)

## 2016-09-13 LAB — CBC
HCT: 28.4 % — ABNORMAL LOW (ref 36.0–46.0)
Hemoglobin: 8.4 g/dL — ABNORMAL LOW (ref 12.0–15.0)
MCH: 25.8 pg — ABNORMAL LOW (ref 26.0–34.0)
MCHC: 29.6 g/dL — ABNORMAL LOW (ref 30.0–36.0)
MCV: 87.1 fL (ref 78.0–100.0)
PLATELETS: 107 10*3/uL — AB (ref 150–400)
RBC: 3.26 MIL/uL — AB (ref 3.87–5.11)
RDW: 18.9 % — AB (ref 11.5–15.5)
WBC: 9.6 10*3/uL (ref 4.0–10.5)

## 2016-09-13 LAB — TROPONIN I
TROPONIN I: 0.03 ng/mL — AB (ref <0.03)
Troponin I: 0.04 ng/mL (ref <0.03)

## 2016-09-13 LAB — INFLUENZA PANEL BY PCR (TYPE A & B)
INFLAPCR: NEGATIVE
Influenza B By PCR: NEGATIVE

## 2016-09-13 LAB — STREP PNEUMONIAE URINARY ANTIGEN: Strep Pneumo Urinary Antigen: NEGATIVE

## 2016-09-13 LAB — PHOSPHORUS: Phosphorus: 6.1 mg/dL — ABNORMAL HIGH (ref 2.5–4.6)

## 2016-09-13 LAB — MAGNESIUM: MAGNESIUM: 2.4 mg/dL (ref 1.7–2.4)

## 2016-09-13 MED ORDER — ORAL CARE MOUTH RINSE
15.0000 mL | Freq: Two times a day (BID) | OROMUCOSAL | Status: DC
  Administered 2016-09-13 – 2016-09-17 (×7): 15 mL via OROMUCOSAL

## 2016-09-13 MED ORDER — DEXTROSE 10 % IV SOLN
INTRAVENOUS | Status: DC
  Administered 2016-09-13 – 2016-09-14 (×3): via INTRAVENOUS

## 2016-09-13 MED ORDER — CHLORHEXIDINE GLUCONATE 0.12 % MT SOLN
15.0000 mL | Freq: Two times a day (BID) | OROMUCOSAL | Status: DC
  Administered 2016-09-13 – 2016-09-18 (×9): 15 mL via OROMUCOSAL
  Filled 2016-09-13 (×4): qty 15

## 2016-09-13 MED ORDER — DEXTROSE 50 % IV SOLN
25.0000 mL | Freq: Once | INTRAVENOUS | Status: AC
  Administered 2016-09-13: 25 mL via INTRAVENOUS

## 2016-09-13 MED ORDER — DEXTROSE 50 % IV SOLN
INTRAVENOUS | Status: AC
  Administered 2016-09-13: 25 mL
  Filled 2016-09-13: qty 50

## 2016-09-13 MED ORDER — DEXTROSE 5 % IV SOLN
INTRAVENOUS | Status: DC
  Administered 2016-09-13: 04:00:00 via INTRAVENOUS

## 2016-09-13 MED ORDER — DEXTROSE 5 % IV SOLN
1.0000 g | INTRAVENOUS | Status: DC
  Administered 2016-09-13 – 2016-09-15 (×3): 1 g via INTRAVENOUS
  Filled 2016-09-13 (×3): qty 1

## 2016-09-13 MED ORDER — DEXTROSE 50 % IV SOLN
INTRAVENOUS | Status: AC
  Filled 2016-09-13: qty 50

## 2016-09-13 MED ORDER — VANCOMYCIN HCL 10 G IV SOLR
1500.0000 mg | INTRAVENOUS | Status: DC
  Filled 2016-09-13: qty 1500

## 2016-09-13 MED ORDER — INSULIN ASPART 100 UNIT/ML ~~LOC~~ SOLN
0.0000 [IU] | SUBCUTANEOUS | Status: DC
  Administered 2016-09-14 – 2016-09-15 (×5): 1 [IU] via SUBCUTANEOUS

## 2016-09-13 NOTE — Progress Notes (Signed)
Pharmacy Antibiotic Note  [place] Pfost is a 71 y.o. female admitted on 08/20/2016 from nursing home with somnolence and weakness. Continuing empiric abx, entering maintenance doses, Scr still elevated thi morning, hopeful for recovery.    Plan: -Cefepime 1 g IV q24h -Vancomycin 1500 mg IV q48h -Monitor renal fx, cultures, VR as needed   Temp (24hrs), Avg:96.1 F (35.6 C), Min:94.5 F (34.7 C), Max:97.3 F (36.3 C)   Recent Labs Lab 09/15/2016 1650 08/22/2016 1815 09/10/2016 2100 08/21/2016 2217 09/13/16 0208 09/13/16 0658  WBC 9.9  --  8.2  --   --  9.6  CREATININE 3.09*  --  3.10* 3.15* 3.12* 3.01*  LATICACIDVEN  --  0.39* 0.7 0.6  --   --     Estimated Creatinine Clearance: 15.6 mL/min (by C-G formula based on SCr of 3.01 mg/dL (H)).    Allergies  Allergen Reactions  . Aldactone [Spironolactone] Other (See Comments)    ARF  . Bactrim [Sulfamethoxazole-Trimethoprim] Hives  . Codeine Other (See Comments)    Causes confusion  . Meperidine Hcl Other (See Comments)    Increase in heart rate (demerol)  . Metformin Nausea And Vomiting    Antimicrobials this admission: 2/24 cefepime > 2/24 vancomycin >  Dose adjustments this admission: N/A  Microbiology results: 2/24 blood cx:  Thank you for allowing pharmacy to be a part of this patient's care.  Harvel Quale 09/13/2016 12:24 PM

## 2016-09-13 NOTE — Progress Notes (Signed)
Pt temp 96.9 orally. RN notified

## 2016-09-13 NOTE — Progress Notes (Signed)
Pt. Had low temperature. Rn was notified.

## 2016-09-13 NOTE — Progress Notes (Signed)
[place] Progress Note Patient Name: [name] DOB: 1945-12-01 MRN: [id number]   Date of Service  09/13/2016  HPI/Events of Note  uring growing > 100 k enterobacter sensitive to amp and vanc and van d/'d today   eICU Interventions  Hold furter abx today and assess 2/26 whether further rx clinically indicated     Intervention Category Major Interventions: Infection - evaluation and management  Christinia Gully 09/13/2016, 3:27 PM

## 2016-09-13 NOTE — CV Procedure (Signed)
Attempted echo but patient in chair, RN said to try when she is back in bed

## 2016-09-13 NOTE — Progress Notes (Signed)
PULMONARY / CRITICAL CARE MEDICINE   Name: [name] MRN: [id number] DOB: 1946/01/17    ADMISSION DATE:  09/01/2016 CONSULTATION DATE:  08/22/2016  REFERRING MD:  Dr. Alvino Chapel, ED  CHIEF COMPLAINT:  Acute hypoxic, hypercarbic respiratory failure  HISTORY OF PRESENT ILLNESS:   71 year old with past medical history of CHF, diabetes, OSA/OHS, pulmonary hypertension. Sent from the nursing home with hypoxia, sats and 68%. Patient was apparently normal yesterday. Workup in the ED significant for ABG 7.22/61/60/89%. Creatinine of 3.09 up from baseline of 1.8, K 5.7, hemoglobin 8.4, platelets 111. LA is normal at 0.3.  She has recent admission at [facility] for double pneumonia and UTI. Discharged on February 14 to a skilled nursing facility. The family notes increasing stump edema. She had been unable to put on her prosthesis due to this. She also noted to have increase in weight by 20 lbs over the past few weeks. Her nursing home switched lasix to torsemide recently to help with this.  PAST MEDICAL HISTORY :  She  has a past medical history of Anxiety; Arthritis; Cancer ([place]); Carotid stenosis; CHF (congestive heart failure) ([place]); Complication of anesthesia; DM (diabetes mellitus) ([place]); Family history of anesthesia complication; Head injury, acute, without loss of consciousness; History of blood transfusion; HTN (hypertension); Hypokalemia; Morbid obesity ([place]); Obesity hypoventilation syndrome ([place]); OSA (obstructive sleep apnea); Osteomyelitis of toe ([place]); PONV (postoperative nausea and vomiting); Pulmonary HTN; Spinal stenosis; and Thrombophlebitis of deep vein of leg ([place]).  PAST SURGICAL HISTORY: She  has a past surgical history that includes Carpal tunnel release (Left, 07/2007); Colon Cancer with resection (2000); Cholecystectomy (2002); Vaginal hysterectomy (1983); Eye surgery (Bilateral); Amputation (Left, 10/07/2012); Amputation (Left, 06/18/2013); Colonoscopy; and Amputation  (Right, 05/04/2014).  Allergies  Allergen Reactions  . Aldactone [Spironolactone] Other (See Comments)    ARF  . Bactrim [Sulfamethoxazole-Trimethoprim] Hives  . Codeine Other (See Comments)    Causes confusion  . Meperidine Hcl Other (See Comments)    Increase in heart rate (demerol)  . Metformin Nausea And Vomiting    No current facility-administered medications on file prior to encounter.    Current Outpatient Prescriptions on File Prior to Encounter  Medication Sig  . acetaminophen (TYLENOL) 500 MG tablet Take 500 mg by mouth every 6 (six) hours as needed for mild pain.   Marland Kitchen allopurinol (ZYLOPRIM) 300 MG tablet Take 300 mg by mouth daily.  Marland Kitchen amLODipine (NORVASC) 5 MG tablet Take 5 mg by mouth at bedtime.   Marland Kitchen aspirin (BAYER ASPIRIN) 325 MG tablet Take 325 mg by mouth daily.    . carvedilol (COREG) 3.125 MG tablet Take 3.125 mg by mouth 2 (two) times daily with a meal.   . docusate sodium (COLACE) 100 MG capsule Take 100 mg by mouth 2 (two) times daily.  . ferrous sulfate 325 (65 FE) MG tablet Take 325 mg by mouth daily with breakfast.  . insulin lispro (HUMALOG) 100 UNIT/ML injection Inject 2-4 Units into the skin 3 (three) times daily before meals. Sliding Scale   150-200 2 units , add an additional 2 units for each 50 increment increase in blood sugar  . LEVEMIR 100 UNIT/ML injection Inject 50-55 Units into the skin 2 (two) times daily. 55 units in the morning and 50 units at bedtime  as directed  . potassium chloride SA (K-DUR,KLOR-CON) 20 MEQ tablet Take 20 mEq by mouth daily.  . pravastatin (PRAVACHOL) 40 MG tablet Take 1 tablet by mouth daily.  . sertraline (ZOLOFT) 50 MG tablet  Take 50 mg by mouth daily. In the evening  . torsemide (DEMADEX) 20 MG tablet Take 60 mg by mouth every morning.   . Vitamin D, Ergocalciferol, (DRISDOL) 50000 UNITS CAPS Take 50,000 Units by mouth 2 (two) times a week. Take on Mon and Thurs.    FAMILY HISTORY:  Her indicated that the status of  her father is unknown.   SOCIAL HISTORY: She  reports that she has never smoked. She has never used smokeless tobacco. She reports that she does not drink alcohol or use drugs.  REVIEW OF SYSTEMS:   Feels well. Denies any cough, dyspnea, sputum, fevers, chills No chest pain, palpitations No nausea, vomiting, diarrhea, constipation. All other review of systems are negative  SUBJECTIVE:  Maintained on BiPAP overnight. Today a.m. she is sitting inside the bed on nasal cannula. Looks comfortable.   VITAL SIGNS: BP 120/80   Pulse (!) 55   Temp 97.3 F (36.3 C) (Oral)   Resp 20   Ht 4\' 1"  (1.245 m)   Wt 254 lb 13.6 oz (115.6 kg)   SpO2 96%   BMI 74.63 kg/m   HEMODYNAMICS:   VENTILATOR SETTINGS: Vent Mode: BIPAP;PCV FiO2 (%):  [50 %-60 %] 50 % Set Rate:  [15 bmp] 15 bmp PEEP:  [5 cmH20] 5 cmH20  INTAKE / OUTPUT: I/O last 3 completed shifts: In: 11 [I.V.:80] Out: 2450 [Urine:2450]  PHYSICAL EXAMINATION: [place]:  No focal deficits HEENT:  No thyromegaly, JVD Cardiovascular:  RRR, no MRG Lungs:  Clear, no wheeze or crackles Abdomen:  Soft, + BS Musculoskeletal:  Bilateral BKA Skin:  Intact  LABS:  BMET  Recent Labs Lab 09/13/2016 1650 08/29/2016 2100 09/16/2016 2217 09/13/16 0208  NA 139  --  139 138  K 5.7*  --  5.3* 5.1  CL 106  --  106 103  CO2 25  --  23 24  BUN 66*  --  65* 64*  CREATININE 3.09* 3.10* 3.15* 3.12*  GLUCOSE 79  --  77 73    Electrolytes  Recent Labs Lab 09/11/2016 1650 09/07/2016 2217 09/13/16 0208  CALCIUM 8.9 8.6* 8.7*    CBC  Recent Labs Lab 08/26/2016 1632 09/14/2016 1650 08/27/2016 2100  WBC  --  9.9 8.2  HGB 10.2* 8.4* 9.1*  HCT 30.0* 28.2* 30.1*  PLT  --  111* 121*    Coag's No results for input(s): APTT, INR in the last 168 hours.  Sepsis Markers  Recent Labs Lab 09/13/2016 1815 08/31/2016 2100 08/26/2016 2217  LATICACIDVEN 0.39* 0.7 0.6  PROCALCITON  --  <0.10  --     ABG  Recent Labs Lab 08/28/2016 1719  09/08/2016 2100 09/13/16 0413  PHART 7.226* 7.256* 7.264*  PCO2ART 61.5* 57.1* 54.7*  PO2ART 64.0* 73.5* 47.0*    Liver Enzymes  Recent Labs Lab 09/06/2016 1650  AST 34  ALT 22  ALKPHOS 84  BILITOT 0.6  ALBUMIN 3.0*    Cardiac Enzymes  Recent Labs Lab 09/07/2016 1650 09/09/2016 2100 09/13/16 0208  TROPONINI <0.03 0.03* 0.03*    Glucose  Recent Labs Lab 08/25/2016 1553 08/26/2016 2055 09/07/2016 2130 09/13/16 0010 09/13/16 0335 09/13/16 0407  GLUCAP 84 65 85 73 58* 85    Imaging Dg Chest Portable 1 View  Result Date: 09/09/2016 CLINICAL DATA:  Reason for exam: AMS, hypoxia. Medical hx: cancer, CHF, diabetes, HTN. EXAM: PORTABLE CHEST 1 VIEW COMPARISON:  09/07/2006 FINDINGS: There is mild enlargement of the cardiopericardial silhouette. No convincing mediastinal or hilar masses. Opacity  at the left lung base obscuring hemidiaphragm and most of the left heart border is stable. Opacity on the right extends from the right perihilar region to the right lung base, obscuring hemidiaphragm. This appears increased from the prior exam, although the difference may be due to the current semi-erect, rotated positioning compared to the prior exam. There is no convincing pulmonary edema.  No pneumothorax. Skeletal structures are demineralized but grossly intact. IMPRESSION: 1. Moderate bilateral pleural effusions, greater on the left. There is associated lung base opacity that is likely atelectasis. Opacity on the left is stable from the prior exam. Opacity on the right may be increased although the difference may be positional only. 2. No convincing residual pulmonary edema. Electronically Signed   By: Lajean Manes M.D.   On: 08/24/2016 16:46     STUDIES:   CULTURES: Bcx 2/24 > Flu PCR 2/24 > negative RVP 2/34 > Pending  ANTIBIOTICS: Vanco 2/24 > Cefepime 2/34>  SIGNIFICANT EVENTS:  LINES/TUBES:   DISCUSSION: 71 year old with acute on chronic hypoxic and hypercarbic respiratory  failure. Recent admission for UTI and pneumonia at [facility].  She may have recurrence of pneumonia, HCAP, sepsis. However she is hemodynamically stable and lactic acid and PCt is normal. Suspect there is a component of volume overload given her weight gain and stump swelling.  ASSESSMENT / PLAN:  PULMONARY A: Acute on chronic hypercarbic, hypoxemic respiratory failure OSA/OHS on BiPAP P:   Continue intermittent BiPAP and mandatory at night Continue antibiotics as above.  CARDIOVASCULAR A:  Volume overload H/O Pulmonary HTN, Diastolic heart failure P:  Continue diuresis with lasix bid Follow up echo  RENAL A:   AKI on CKD Hyperkalemia> resolved P:   Monitor urine output and Cr   GASTROINTESTINAL A:   Stable P:   Keep NPO for now Will monitor mental status.  May need speech eval  HEMATOLOGIC A:   Mild anemia, thrombocytopenia WBC is normal P:  Follow CBC  INFECTIOUS A:   Cover for HCAP R/O Flu P:   Continue vanco, cefepime. Low threshold to discontinue. Follow cultures, Pct  ENDOCRINE A:   DM Hypoglycemia P:   SSI coverage. D5W  NEUROLOGIC A:   Encephalopathy. Likely secondary to hypercarbia P:   Limit sedating meds  FAMILY  - Updates: Daughter updated at bedside. Patient is DNR/DNI. I spok with the patient and she confirms this 2/25 - Inter-disciplinary family meet or Palliative Care meeting due by: 3/3  Stable for transfer to SDU  Marshell Garfinkel MD [place] [facility] Pager [phone] If no answer or after 3pm call: [phone] 09/13/2016, 7:20 AM

## 2016-09-13 NOTE — Progress Notes (Signed)
Rn notified of low temperature.

## 2016-09-13 NOTE — Progress Notes (Signed)
[place] Progress Note Patient Name: [place] Stonesifer DOB: September 14, 1945 MRN: [id number]   Date of Service  09/13/2016  HPI/Events of Note  At least 2 episodes of hypoglycemia.  Has some renal insufficiency and is on no IVFs due to resp distress on lasix.  eICU Interventions  Plan: Start D5W at 30 cc/hr Change to SSI sensitive scale     Intervention Category Intermediate Interventions: Other:  Breane Grunwald 09/13/2016, 3:40 AM

## 2016-09-14 ENCOUNTER — Inpatient Hospital Stay (HOSPITAL_COMMUNITY): Payer: Medicare Other

## 2016-09-14 DIAGNOSIS — I509 Heart failure, unspecified: Secondary | ICD-10-CM

## 2016-09-14 DIAGNOSIS — I5031 Acute diastolic (congestive) heart failure: Secondary | ICD-10-CM

## 2016-09-14 DIAGNOSIS — G92 Toxic encephalopathy: Secondary | ICD-10-CM

## 2016-09-14 LAB — ECHOCARDIOGRAM COMPLETE
E/e' ratio: 19.56
EWDT: 109 ms
FS: 27 % — AB (ref 28–44)
HEIGHTINCHES: 49 in
IVS/LV PW RATIO, ED: 0.98
LA diam end sys: 56 mm
LA diam index: 3.15 cm/m2
LA vol index: 55.9 mL/m2
LASIZE: 56 mm
LAVOL: 99.5 mL
LAVOLA4C: 90.3 mL
LV E/e' medial: 19.56
LV E/e'average: 19.56
LVELAT: 6.39 cm/s
LVOT SV: 49 mL
LVOT VTI: 21.4 cm
LVOT area: 2.27 cm2
LVOT diameter: 17 mm
LVOT peak vel: 83 cm/s
MV Dec: 109
MVPG: 6 mmHg
MVPKAVEL: 66.7 m/s
MVPKEVEL: 125 m/s
PW: 10.6 mm — AB (ref 0.6–1.1)
RV LATERAL S' VELOCITY: 14.9 cm/s
RV TAPSE: 15.8 mm
Reg peak vel: 344 cm/s
TDI e' lateral: 6.39
TDI e' medial: 3.85
TRMAXVEL: 344 cm/s
WEIGHTICAEL: 4030.4 [oz_av]

## 2016-09-14 LAB — GLUCOSE, CAPILLARY
GLUCOSE-CAPILLARY: 133 mg/dL — AB (ref 65–99)
Glucose-Capillary: 101 mg/dL — ABNORMAL HIGH (ref 65–99)
Glucose-Capillary: 105 mg/dL — ABNORMAL HIGH (ref 65–99)
Glucose-Capillary: 136 mg/dL — ABNORMAL HIGH (ref 65–99)

## 2016-09-14 LAB — URINE CULTURE: Culture: NO GROWTH

## 2016-09-14 LAB — BASIC METABOLIC PANEL
Anion gap: 8 (ref 5–15)
BUN: 65 mg/dL — AB (ref 6–20)
CO2: 24 mmol/L (ref 22–32)
Calcium: 8.5 mg/dL — ABNORMAL LOW (ref 8.9–10.3)
Chloride: 106 mmol/L (ref 101–111)
Creatinine, Ser: 2.91 mg/dL — ABNORMAL HIGH (ref 0.44–1.00)
GFR calc Af Amer: 17 mL/min — ABNORMAL LOW (ref >60)
GFR, EST NON AFRICAN AMERICAN: 15 mL/min — AB (ref >60)
GLUCOSE: 103 mg/dL — AB (ref 65–99)
POTASSIUM: 5.2 mmol/L — AB (ref 3.5–5.1)
SODIUM: 138 mmol/L (ref 135–145)

## 2016-09-14 LAB — CBC
HCT: 27.1 % — ABNORMAL LOW (ref 36.0–46.0)
Hemoglobin: 8.1 g/dL — ABNORMAL LOW (ref 12.0–15.0)
MCH: 26.3 pg (ref 26.0–34.0)
MCHC: 29.9 g/dL — AB (ref 30.0–36.0)
MCV: 88 fL (ref 78.0–100.0)
PLATELETS: 88 10*3/uL — AB (ref 150–400)
RBC: 3.08 MIL/uL — AB (ref 3.87–5.11)
RDW: 19.4 % — ABNORMAL HIGH (ref 11.5–15.5)
WBC: 8.4 10*3/uL (ref 4.0–10.5)

## 2016-09-14 LAB — MAGNESIUM: Magnesium: 2.4 mg/dL (ref 1.7–2.4)

## 2016-09-14 LAB — PHOSPHORUS: Phosphorus: 6.2 mg/dL — ABNORMAL HIGH (ref 2.5–4.6)

## 2016-09-14 MED ORDER — FUROSEMIDE 10 MG/ML IJ SOLN: 60.0000 mg | Freq: Two times a day (BID) | INTRAMUSCULAR | Status: DC

## 2016-09-14 MED ORDER — PERFLUTREN LIPID MICROSPHERE
1.0000 mL | INTRAVENOUS | Status: AC | PRN
  Administered 2016-09-14: 2 mL via INTRAVENOUS
  Filled 2016-09-14: qty 10

## 2016-09-14 MED ORDER — FUROSEMIDE 10 MG/ML IJ SOLN
80.0000 mg | Freq: Two times a day (BID) | INTRAMUSCULAR | Status: DC
Start: 2016-09-14 — End: 2016-09-15
  Administered 2016-09-14: 80 mg via INTRAVENOUS
  Filled 2016-09-14 (×2): qty 8

## 2016-09-14 MED ORDER — METOPROLOL TARTRATE 5 MG/5ML IV SOLN
2.5000 mg | Freq: Four times a day (QID) | INTRAVENOUS | Status: DC
  Administered 2016-09-14 – 2016-09-15 (×3): 2.5 mg via INTRAVENOUS
  Filled 2016-09-14 (×6): qty 5

## 2016-09-14 NOTE — Progress Notes (Signed)
  Echocardiogram 2D Echocardiogram has been performed.  Donata Clay 09/14/2016, 10:58 AM

## 2016-09-14 NOTE — Progress Notes (Signed)
[place] TEAM 1 - Martorell  [id number] DOB: [birth year] DOA: 09/10/2016 PCP: Marco Collie, MD    Brief Narrative:  71 year old with history of CHF, DM, OSA/OHS, and pulmonary HTN who was sent from the nursing home with O2 sats 68%. Workup in the ED was significant for ABG 7.22/61/60. Creatinine of 3.09 up from baseline of 1.8, K 5.7, hemoglobin 8.4, platelets 111.  She was admitted to [facility] for pneumonia and UTI and discharged on February 14 to a SNF. The family noted increasing stump edema and increase in weight by 20 lbs over a few weeks.   Subjective: The patient awakens to voice but is somewhat lethargic.  Her nurse reports that she's been agitated this morning.  She remains on BiPAP.  She is not able to provide a detailed hx.    Assessment & Plan:  Acute on chronic hypercarbic, hypoxemic respiratory failure due to Pulmonary HTN, Acute exacerbation of Diastolic heart failure  Dry weight appears to be as low as 101kg - EF 55-60% w/ grade 2 DD as as of Sept 2016 - TTE pending  Filed Weights   09/16/2016 2100 09/13/16 0500 09/14/16 0207  Weight: 114.9 kg (253 lb 4.9 oz) 115.6 kg (254 lb 13.6 oz) 114.3 kg (251 lb 14.4 oz)     OSA/OHS on BiPAP  AKI on CKD crt 1.28 Jul 2015 - follow trend   Recent Labs Lab 09/14/2016 2100 09/06/2016 2217 09/13/16 0208 09/13/16 0658 09/14/16 0221  CREATININE 3.10* 3.15* 3.12* 3.01* 2.91*    HTN BP poorly controlled - adjust medical tx and follow  Hyperkalemia Diurese and follow  DM CBG currently well-controlled   Encephalopathy likely secondary to hypercarbia - cont BIPAP and follow    DVT prophylaxis: SQ heparin  Code Status: DNR - NO CODE Family Communication: no family present at time of exam  Disposition Plan:   Consultants:  PCCM   Antimicrobials:  Vanco 2/24 > Cefepime 2/34>  Objective: Blood pressure (!) 157/92, pulse 76, temperature 98.8 F (37.1 C), resp. rate (!) 24, height 4\' 1"   (1.245 m), weight 114.3 kg (251 lb 14.4 oz), SpO2 97 %.  Intake/Output Summary (Last 24 hours) at 09/14/16 1039 Last data filed at 09/14/16 1000  Gross per 24 hour  Intake           1172.5 ml  Output             1640 ml  Net           -467.5 ml   Filed Weights   09/13/2016 2100 09/13/16 0500 09/14/16 0207  Weight: 114.9 kg (253 lb 4.9 oz) 115.6 kg (254 lb 13.6 oz) 114.3 kg (251 lb 14.4 oz)    Examination: General: No acute respiratory distress evident, but pt somnolent Lungs: Poor air movement throughout all fields - no wheezing or focal crackles Cardiovascular: Distant heart sounds regular rate and rhythm without murmur  Abdomen: Nontender, nondistended, soft, bowel sounds positive, no rebound, no ascites, no appreciable mass Extremities: No significant edema bilateral lower extremity stumps  CBC:  Recent Labs Lab 08/22/2016 1632 09/09/2016 1650 09/13/2016 2100 09/13/16 0658 09/14/16 0221  WBC  --  9.9 8.2 9.6 8.4  NEUTROABS  --  8.3*  --   --   --   HGB 10.2* 8.4* 9.1* 8.4* 8.1*  HCT 30.0* 28.2* 30.1* 28.4* 27.1*  MCV  --  87.6 87.5 87.1 88.0  PLT  --  111* 121* 107* 88*  Basic Metabolic Panel:  Recent Labs Lab 08/29/2016 1650 08/30/2016 2100 09/03/2016 2217 09/13/16 0208 09/13/16 0658 09/14/16 0221  NA 139  --  139 138 138 138  K 5.7*  --  5.3* 5.1 5.0 5.2*  CL 106  --  106 103 105 106  CO2 25  --  23 24 24 24   GLUCOSE 79  --  77 73 67 103*  BUN 66*  --  65* 64* 66* 65*  CREATININE 3.09* 3.10* 3.15* 3.12* 3.01* 2.91*  CALCIUM 8.9  --  8.6* 8.7* 8.7* 8.5*  MG  --   --   --   --  2.4 2.4  PHOS  --   --   --   --  6.1* 6.2*   GFR: Estimated Creatinine Clearance: 15.9 mL/min (by C-G formula based on SCr of 2.91 mg/dL (H)).  Liver Function Tests:  Recent Labs Lab 09/01/2016 1650  AST 34  ALT 22  ALKPHOS 84  BILITOT 0.6  PROT 6.9  ALBUMIN 3.0*    Cardiac Enzymes:  Recent Labs Lab 08/26/2016 1650 08/24/2016 2100 09/13/16 0208 09/13/16 0658  TROPONINI <0.03  0.03* 0.03* 0.04*    HbA1C: Hgb A1c MFr Bld  Date/Time Value Ref Range Status  06/17/2013 05:30 AM 10.2 (H) <5.7 % Final    Comment:    (NOTE)                                                                       According to the ADA Clinical Practice Recommendations for 2011, when HbA1c is used as a screening test:  >=6.5%   Diagnostic of Diabetes Mellitus           (if abnormal result is confirmed) 5.7-6.4%   Increased risk of developing Diabetes Mellitus References:Diagnosis and Classification of Diabetes Mellitus,Diabetes [id number] 1):S62-S69 and Standards of Medical Care in         Diabetes - 2011,Diabetes [id number] (Suppl 1):S11-S61.    CBG:  Recent Labs Lab 09/13/16 1645 09/13/16 1939 09/13/16 2336 09/14/16 0356 09/14/16 0809  GLUCAP 97 105* 142* 101* 105*    Recent Results (from the past 240 hour(s))  Culture, blood (routine x 2)     Status: None (Preliminary result)   Collection Time: 08/24/2016  4:26 PM  Result Value Ref Range Status   Specimen Description BLOOD RIGHT HAND  Final   Special Requests BOTTLES DRAWN AEROBIC AND ANAEROBIC 5CC  Final   Culture NO GROWTH < 24 HOURS  Final   Report Status PENDING  Incomplete  Culture, blood (routine x 2)     Status: None (Preliminary result)   Collection Time: 08/28/2016  6:08 PM  Result Value Ref Range Status   Specimen Description BLOOD LEFT HAND  Final   Special Requests BOTTLES DRAWN AEROBIC ONLY [place]  Final   Culture NO GROWTH < 24 HOURS  Final   Report Status PENDING  Incomplete  Urine culture     Status: None   Collection Time: 08/21/2016  6:43 PM  Result Value Ref Range Status   Specimen Description URINE, RANDOM  Final   Special Requests ADDED 0201 09/13/16  Final   Culture NO GROWTH  Final   Report Status 09/14/2016 FINAL  Final  Respiratory  Panel by PCR     Status: None   Collection Time: 08/27/2016  9:13 PM  Result Value Ref Range Status   Adenovirus NOT DETECTED NOT DETECTED Final    Coronavirus 229E NOT DETECTED NOT DETECTED Final   Coronavirus HKU1 NOT DETECTED NOT DETECTED Final   Coronavirus NL63 NOT DETECTED NOT DETECTED Final   Coronavirus OC43 NOT DETECTED NOT DETECTED Final   Metapneumovirus NOT DETECTED NOT DETECTED Final   Rhinovirus / Enterovirus NOT DETECTED NOT DETECTED Final   Influenza A NOT DETECTED NOT DETECTED Final   Influenza B NOT DETECTED NOT DETECTED Final   Parainfluenza Virus 1 NOT DETECTED NOT DETECTED Final   Parainfluenza Virus 2 NOT DETECTED NOT DETECTED Final   Parainfluenza Virus 3 NOT DETECTED NOT DETECTED Final   Parainfluenza Virus 4 NOT DETECTED NOT DETECTED Final   Respiratory Syncytial Virus NOT DETECTED NOT DETECTED Final   Bordetella pertussis NOT DETECTED NOT DETECTED Final   Chlamydophila pneumoniae NOT DETECTED NOT DETECTED Final   Mycoplasma pneumoniae NOT DETECTED NOT DETECTED Final  MRSA PCR Screening     Status: None   Collection Time: 09/06/2016  9:13 PM  Result Value Ref Range Status   MRSA by PCR NEGATIVE NEGATIVE Final    Comment:        The GeneXpert MRSA Assay (FDA approved for NASAL specimens only), is one component of a comprehensive MRSA colonization surveillance program. It is not intended to diagnose MRSA infection nor to guide or monitor treatment for MRSA infections.      Scheduled Meds: . ceFEPime (MAXIPIME) IV  1 g Intravenous Q24H  . chlorhexidine  15 mL Mouth Rinse BID  . furosemide  40 mg Intravenous Q12H  . heparin  5,000 Units Subcutaneous Q8H  . insulin aspart  0-9 Units Subcutaneous Q4H  . mouth rinse  15 mL Mouth Rinse q12n4p  . vancomycin  1,500 mg Intravenous Q48H     LOS: 2 days   Cherene Altes, MD [facility] Office  [phone] Pager - Text Page per Shea Evans as per below:  On-Call/Text Page:      [email or web address]      password [email or web address]  If 7PM-7AM, please contact night-coverage [email or web address] Password [email or web address] 09/14/2016, 10:39 AM

## 2016-09-14 NOTE — Care Management Note (Signed)
Case Management Note  Patient Details  Name: [name] MRN: [id number] Date of Birth: [birth year]  Subjective/Objective:   Pt admitted with AMS                   Action/Plan:   PTA pt was a SNF resident - CSW consulted   Expected Discharge Date:                  Expected Discharge Plan:  [place] (From [place])  In-House Referral:  Clinical Social Work  Discharge planning Services     Post Acute Care Choice:    Choice offered to:     DME Arranged:    DME Agency:     HH Arranged:    [place] Agency:     Status of Service:  In process, will continue to follow  If discussed at Long Length of Stay Meetings, dates discussed:    Additional Comments:  Maryclare Labrador, RN 09/14/2016, 3:56 PM

## 2016-09-14 NOTE — Progress Notes (Signed)
Patient placed on BiPAP at this time due to increased WOB.

## 2016-09-15 DIAGNOSIS — J189 Pneumonia, unspecified organism: Secondary | ICD-10-CM

## 2016-09-15 DIAGNOSIS — J9602 Acute respiratory failure with hypercapnia: Secondary | ICD-10-CM

## 2016-09-15 DIAGNOSIS — N179 Acute kidney failure, unspecified: Secondary | ICD-10-CM

## 2016-09-15 DIAGNOSIS — N289 Disorder of kidney and ureter, unspecified: Secondary | ICD-10-CM

## 2016-09-15 LAB — COMPREHENSIVE METABOLIC PANEL
ALT: 18 U/L (ref 14–54)
AST: 22 U/L (ref 15–41)
Albumin: 2.7 g/dL — ABNORMAL LOW (ref 3.5–5.0)
Alkaline Phosphatase: 67 U/L (ref 38–126)
Anion gap: 8 (ref 5–15)
BUN: 69 mg/dL — ABNORMAL HIGH (ref 6–20)
CHLORIDE: 104 mmol/L (ref 101–111)
CO2: 27 mmol/L (ref 22–32)
CREATININE: 3.19 mg/dL — AB (ref 0.44–1.00)
Calcium: 8.5 mg/dL — ABNORMAL LOW (ref 8.9–10.3)
GFR, EST AFRICAN AMERICAN: 16 mL/min — AB (ref >60)
GFR, EST NON AFRICAN AMERICAN: 14 mL/min — AB (ref >60)
Glucose, Bld: 117 mg/dL — ABNORMAL HIGH (ref 65–99)
POTASSIUM: 4.8 mmol/L (ref 3.5–5.1)
Sodium: 139 mmol/L (ref 135–145)
Total Bilirubin: 0.8 mg/dL (ref 0.3–1.2)
Total Protein: 5.8 g/dL — ABNORMAL LOW (ref 6.5–8.1)

## 2016-09-15 LAB — FOLATE: Folate: 11.3 ng/mL (ref >5.9)

## 2016-09-15 LAB — GLUCOSE, CAPILLARY
GLUCOSE-CAPILLARY: 113 mg/dL — AB (ref 65–99)
GLUCOSE-CAPILLARY: 111 mg/dL — AB (ref 65–99)
GLUCOSE-CAPILLARY: 120 mg/dL — AB (ref 65–99)
Glucose-Capillary: 112 mg/dL — ABNORMAL HIGH (ref 65–99)
Glucose-Capillary: 122 mg/dL — ABNORMAL HIGH (ref 65–99)
Glucose-Capillary: 135 mg/dL — ABNORMAL HIGH (ref 65–99)

## 2016-09-15 LAB — CBC
HCT: 24.6 % — ABNORMAL LOW (ref 36.0–46.0)
Hemoglobin: 7.5 g/dL — ABNORMAL LOW (ref 12.0–15.0)
MCH: 26.5 pg (ref 26.0–34.0)
MCHC: 30.5 g/dL (ref 30.0–36.0)
MCV: 86.9 fL (ref 78.0–100.0)
Platelets: 83 10*3/uL — ABNORMAL LOW (ref 150–400)
RBC: 2.83 MIL/uL — AB (ref 3.87–5.11)
RDW: 19.1 % — ABNORMAL HIGH (ref 11.5–15.5)
WBC: 8.9 10*3/uL (ref 4.0–10.5)

## 2016-09-15 LAB — IRON AND TIBC
Iron: 15 ug/dL — ABNORMAL LOW (ref 28–170)
Saturation Ratios: 7 % — ABNORMAL LOW (ref 10.4–31.8)
TIBC: 211 ug/dL — ABNORMAL LOW (ref 250–450)
UIBC: 196 ug/dL

## 2016-09-15 LAB — POCT I-STAT 3, ART BLOOD GAS (G3+)
Acid-base deficit: 1 mmol/L (ref 0.0–2.0)
Bicarbonate: 26.8 mmol/L (ref 20.0–28.0)
O2 SAT: 92 %
PCO2 ART: 59.3 mmHg — AB (ref 32.0–48.0)
PO2 ART: 72 mmHg — AB (ref 83.0–108.0)
Patient temperature: 97.9
TCO2: 29 mmol/L (ref 0–100)
pH, Arterial: 7.261 — ABNORMAL LOW (ref 7.350–7.450)

## 2016-09-15 LAB — RETICULOCYTES
RBC.: 2.83 MIL/uL — AB (ref 3.87–5.11)
RETIC COUNT ABSOLUTE: 39.6 10*3/uL (ref 19.0–186.0)
RETIC CT PCT: 1.4 % (ref 0.4–3.1)

## 2016-09-15 LAB — AMMONIA: Ammonia: 36 umol/L — ABNORMAL HIGH (ref 9–35)

## 2016-09-15 LAB — VITAMIN B12: Vitamin B-12: 824 pg/mL (ref 180–914)

## 2016-09-15 LAB — LEGIONELLA PNEUMOPHILA SEROGP 1 UR AG: L. pneumophila Serogp 1 Ur Ag: NEGATIVE

## 2016-09-15 LAB — FERRITIN: FERRITIN: 179 ng/mL (ref 11–307)

## 2016-09-15 MED ORDER — DEXTROSE-NACL 5-0.9 % IV SOLN
INTRAVENOUS | Status: AC
  Administered 2016-09-15: 09:00:00 via INTRAVENOUS

## 2016-09-15 MED ORDER — MORPHINE SULFATE (PF) 2 MG/ML IV SOLN: 1.0000 mg | INTRAVENOUS | Status: DC | PRN

## 2016-09-15 MED ORDER — METOPROLOL TARTRATE 5 MG/5ML IV SOLN: 2.5000 mg | Freq: Four times a day (QID) | INTRAVENOUS | Status: DC | PRN

## 2016-09-15 MED ORDER — DEXTROSE-NACL 5-0.9 % IV SOLN: INTRAVENOUS | Status: DC

## 2016-09-15 MED ORDER — HALOPERIDOL LACTATE 5 MG/ML IJ SOLN: 5.0000 mg | Freq: Once | INTRAMUSCULAR | Status: DC

## 2016-09-15 MED ORDER — LORAZEPAM 2 MG/ML IJ SOLN
1.0000 mg | Freq: Once | INTRAMUSCULAR | Status: AC
  Administered 2016-09-15: 1 mg via INTRAVENOUS
  Filled 2016-09-15: qty 1

## 2016-09-15 NOTE — Progress Notes (Signed)
Patient transported to 3W12 from 2M13 without any apparent complications.

## 2016-09-15 NOTE — Progress Notes (Addendum)
[place] TEAM 1 - [place]  [id number] DOB: 1946-02-16 DOA: 09/07/2016 PCP: Marco Collie, MD    Brief Narrative:  71 year old with history of CHF, DM, OSA/OHS, and pulmonary HTN who was sent from the nursing home with O2 sats 68%. Workup in the ED was significant for ABG 7.22/61/60. Creatinine of 3.09 up from baseline of 1.8, K 5.7, hemoglobin 8.4, platelets 111.  She was admitted to [facility] for pneumonia and UTI and discharged on February 14 to a SNF. The family noted increasing stump edema and increase in weight by 20 lbs over a few weeks.   Subjective: Lethargic, received Ativan last night,5 beat Vtach followed by SB then into Junctional Rhythm per EKG, She remains on BiPAP. unarousable      Assessment & Plan: Acute on chronic hypercarbic, hypoxemic respiratory failure due to Pulmonary HTN, Acute exacerbation of Diastolic heart failure /OSA Dry weight appears to be as low as 101kg - EF 55-60% w/ grade 2 DD as as of Sept 2016 - TTE 2/26  EF: 55% -   60% Respiratory panel negative Chest x-ray shows bilateral pleural effusions Repeat ABG unchanged compared to 2/25, requested PCCM for further recommendations   Filed Weights   09/13/16 0500 09/14/16 0207 09/15/16 0405  Weight: 115.6 kg (254 lb 13.6 oz) 114.3 kg (251 lb 14.4 oz) 113.9 kg (251 lb)  lasix held due to worsening renal function     OSA/OHS on BiPAP  AKI on CKD crt 1.28 Jul 2015 -Creatinine increasing, hold Lasix for now  Recent Labs Lab 08/24/2016 2217 09/13/16 0208 09/13/16 0658 09/14/16 0221 09/15/16 0256  CREATININE 3.15* 3.12* 3.01* 2.91* 3.19*  Repeat renal function in a.m.   UTI, probable HCAP uring growing > 100 k enterobacter sensitive to amp and vanc and van d/'d 2/25 Currently on cefepime  HTN BP poorly controlled - adjust medical tx and follow  Hyperkalemia Diurese and follow  DM Patient was on D10, now switched to D5 normal saline at 75cc/hr   Acute  toxic/metabolic Encephalopathy likely secondary to hypercarbia - cont BIPAP and follow  Repeat ABG Avoid Ativan Consider MRI of the brain if  Pulmonary status allows   DVT prophylaxis: SQ heparin  Code Status: DNR - NO CODE Family Communication:daughter by the bedside , all aspects of care discussed  Disposition Plan:   Consultants:  PCCM   Antimicrobials:  Vanco 2/24 > Cefepime 2/34>  Objective: Blood pressure (!) 125/51, pulse (!) 57, temperature 97.9 F (36.6 C), resp. rate 18, height 4\' 1"  (1.245 m), weight 113.9 kg (251 lb), SpO2 99 %.  Intake/Output Summary (Last 24 hours) at 09/15/16 0828 Last data filed at 09/15/16 0800  Gross per 24 hour  Intake             1200 ml  Output             1510 ml  Net             -310 ml   Filed Weights   09/13/16 0500 09/14/16 0207 09/15/16 0405  Weight: 115.6 kg (254 lb 13.6 oz) 114.3 kg (251 lb 14.4 oz) 113.9 kg (251 lb)    Examination: General  pt somnolent Lungs: Poor air movement throughout all fields - no wheezing or focal crackles Cardiovascular: Distant heart sounds regular rate and rhythm without murmur  Abdomen: Nontender, nondistended, soft, bowel sounds positive, no rebound, no ascites, no appreciable mass Extremities: No significant edema bilateral lower extremity stumps  CBC:  Recent Labs Lab 09/16/2016 1650 08/23/2016 2100 09/13/16 0658 09/14/16 0221 09/15/16 0256  WBC 9.9 8.2 9.6 8.4 8.9  NEUTROABS 8.3*  --   --   --   --   HGB 8.4* 9.1* 8.4* 8.1* 7.5*  HCT 28.2* 30.1* 28.4* 27.1* 24.6*  MCV 87.6 87.5 87.1 88.0 86.9  PLT 111* 121* 107* 88* 83*   Basic Metabolic Panel:  Recent Labs Lab 08/22/2016 2217 09/13/16 0208 09/13/16 0658 09/14/16 0221 09/15/16 0256  NA 139 138 138 138 139  K 5.3* 5.1 5.0 5.2* 4.8  CL 106 103 105 106 104  CO2 23 24 24 24 27   GLUCOSE 77 73 67 103* 117*  BUN 65* 64* 66* 65* 69*  CREATININE 3.15* 3.12* 3.01* 2.91* 3.19*  CALCIUM 8.6* 8.7* 8.7* 8.5* 8.5*  MG  --   --  2.4  2.4  --   PHOS  --   --  6.1* 6.2*  --    GFR: Estimated Creatinine Clearance: 14.5 mL/min (by C-G formula based on SCr of 3.19 mg/dL (H)).  Liver Function Tests:  Recent Labs Lab 09/09/2016 1650 09/15/16 0256  AST 34 22  ALT 22 18  ALKPHOS 84 67  BILITOT 0.6 0.8  PROT 6.9 5.8*  ALBUMIN 3.0* 2.7*    Cardiac Enzymes:  Recent Labs Lab 08/21/2016 1650 09/08/2016 2100 09/13/16 0208 09/13/16 0658  TROPONINI <0.03 0.03* 0.03* 0.04*    HbA1C: Hgb A1c MFr Bld  Date/Time Value Ref Range Status  06/17/2013 05:30 AM 10.2 (H) <5.7 % Final    Comment:    (NOTE)                                                                       According to the ADA Clinical Practice Recommendations for 2011, when HbA1c is used as a screening test:  >=6.5%   Diagnostic of Diabetes Mellitus           (if abnormal result is confirmed) 5.7-6.4%   Increased risk of developing Diabetes Mellitus References:Diagnosis and Classification of Diabetes Mellitus,Diabetes [id number] 1):S62-S69 and Standards of Medical Care in         Diabetes - 2011,Diabetes [id number] (Suppl 1):S11-S61.    CBG:  Recent Labs Lab 09/14/16 0809 09/14/16 1505 09/14/16 2026 09/15/16 0018 09/15/16 0414  GLUCAP 105* 136* 133* 135* 113*    Recent Results (from the past 240 hour(s))  Culture, blood (routine x 2)     Status: None (Preliminary result)   Collection Time: 08/22/2016  4:26 PM  Result Value Ref Range Status   Specimen Description BLOOD RIGHT HAND  Final   Special Requests BOTTLES DRAWN AEROBIC AND ANAEROBIC 5CC  Final   Culture NO GROWTH 2 DAYS  Final   Report Status PENDING  Incomplete  Culture, blood (routine x 2)     Status: None (Preliminary result)   Collection Time: 09/11/2016  6:08 PM  Result Value Ref Range Status   Specimen Description BLOOD LEFT HAND  Final   Special Requests BOTTLES DRAWN AEROBIC ONLY [employer]  Final   Culture NO GROWTH 2 DAYS  Final   Report Status PENDING  Incomplete  Urine  culture     Status: None   Collection Time:  08/23/2016  6:43 PM  Result Value Ref Range Status   Specimen Description URINE, RANDOM  Final   Special Requests ADDED 0201 09/13/16  Final   Culture NO GROWTH  Final   Report Status 09/14/2016 FINAL  Final  Respiratory Panel by PCR     Status: None   Collection Time: 09/01/2016  9:13 PM  Result Value Ref Range Status   Adenovirus NOT DETECTED NOT DETECTED Final   Coronavirus 229E NOT DETECTED NOT DETECTED Final   Coronavirus HKU1 NOT DETECTED NOT DETECTED Final   Coronavirus NL63 NOT DETECTED NOT DETECTED Final   Coronavirus OC43 NOT DETECTED NOT DETECTED Final   Metapneumovirus NOT DETECTED NOT DETECTED Final   Rhinovirus / Enterovirus NOT DETECTED NOT DETECTED Final   Influenza A NOT DETECTED NOT DETECTED Final   Influenza B NOT DETECTED NOT DETECTED Final   Parainfluenza Virus 1 NOT DETECTED NOT DETECTED Final   Parainfluenza Virus 2 NOT DETECTED NOT DETECTED Final   Parainfluenza Virus 3 NOT DETECTED NOT DETECTED Final   Parainfluenza Virus 4 NOT DETECTED NOT DETECTED Final   Respiratory Syncytial Virus NOT DETECTED NOT DETECTED Final   Bordetella pertussis NOT DETECTED NOT DETECTED Final   Chlamydophila pneumoniae NOT DETECTED NOT DETECTED Final   Mycoplasma pneumoniae NOT DETECTED NOT DETECTED Final  MRSA PCR Screening     Status: None   Collection Time: 09/04/2016  9:13 PM  Result Value Ref Range Status   MRSA by PCR NEGATIVE NEGATIVE Final    Comment:        The GeneXpert MRSA Assay (FDA approved for NASAL specimens only), is one component of a comprehensive MRSA colonization surveillance program. It is not intended to diagnose MRSA infection nor to guide or monitor treatment for MRSA infections.      Scheduled Meds: . ceFEPime (MAXIPIME) IV  1 g Intravenous Q24H  . chlorhexidine  15 mL Mouth Rinse BID  . haloperidol lactate  5 mg Intravenous Once  . heparin  5,000 Units Subcutaneous Q8H  . insulin aspart  0-9 Units  Subcutaneous Q4H  . mouth rinse  15 mL Mouth Rinse q12n4p  . metoprolol  2.5 mg Intravenous Q6H     LOS: 3 days     [facility] Office  [phone] Pager - Text Page per Shea Evans as per below:     If 7PM-7AM, please contact night-coverage [email or web address] Password [facility] 09/15/2016, 8:28 AM

## 2016-09-15 NOTE — Progress Notes (Signed)
Report called to [place] RN and patient transferred to [phone] via bed with monitor and Bipap and RT ,Family was also at bedside. She tolerated the transfer well.

## 2016-09-15 NOTE — Progress Notes (Signed)
Attempted to call report to 3w X2

## 2016-09-15 NOTE — Progress Notes (Signed)
Patient went into 5 beat Vtach followed by SB then into Junctional Rhythm per EKG. Patient is resting after 1 mg Ativan given earlier in shift. Not arousable to speech as she was earlier in shift. [facility] MD called and daughter called as she has had a change in status. Daughter is on the way from her home 45 mins away. Will continue to monitor closely Ruben Gottron, RN 09/15/2016 [id number]

## 2016-09-15 NOTE — Progress Notes (Signed)
Pt is on the NIV on arrival tolerating it fairly well. Pt has history of CHF/pulmonary hypertension and a history of hypercarbic respiratory failure. Patient was encourage to wear the NIV at this time due to patient hemodynamic instability. Pt sister is at the bedside at this time. Mention to pt sister why pt need the NIV at this time. RN aware.

## 2016-09-15 NOTE — Progress Notes (Signed)
PULMONARY / CRITICAL CARE MEDICINE   Name: [name] MRN: [id number] DOB: 1946-03-14    ADMISSION DATE:  09/16/2016 CONSULTATION DATE:  08/25/2016  REFERRING MD:  Dr. Alvino Chapel, ED  CHIEF COMPLAINT:  Acute hypoxic, hypercarbic respiratory failure, now worsening MS on BiPAP  18/6 with ABG 7.25/60.3/74/27/29 and CCM called back to reassess 2/27.  HISTORY OF PRESENT ILLNESS:   71 year old with past medical history of CHF, diabetes, OSA/OHS, pulmonary hypertension. Sent from the nursing home with hypoxia, sats and 68%. Patient was apparently normal yesterday. Workup in the ED on admission  significant for ABG 7.22/61/60/89%. Creatinine of 3.09 up from baseline of 1.8, K 5.7, hemoglobin 8.4, platelets 111. LA is normal at 0.3.  She has recent admission at [facility] for double pneumonia and UTI. Discharged on February 14 to a skilled nursing facility. The family notes increasing stump edema. She had been unable to put on her prosthesis due to this. She also noted to have increase in weight by 20 lbs over the past few weeks. Her nursing home switched lasix to torsemide recently to help with this.  She was admitted 2/24, treated with BiPAP, Antibiotics, and gentle diuresis. She improved initially and care was transferred to [facility], but she has subsequently had respiratory decline. The evening of 2/26 she had cardiac arrythmias and creatinine has again bumped to 3.19.Family are aware of their mothers wishes for no intubation/CPR, and are struggling with next steps.Of note she did get a one time dose of ativan 1 mg 2/26 at midnight.   SUBJECTIVE:  Maintained on BiPAP since 2/26 at 1800 for increased WOB. Unresponsive except to pain this morning. Non-verbal ABG declining.  VITAL SIGNS: BP (!) 146/66   Pulse 67   Temp 97.7 F (36.5 C)   Resp (!) 23   Ht 4\' 1"  (1.245 m)   Wt 251 lb (113.9 kg)   SpO2 98%   BMI 73.50 kg/m   HEMODYNAMICS:   VENTILATOR SETTINGS: Vent Mode:  PCV;BIPAP FiO2 (%):  [40 %] 40 % Set Rate:  [15 bmp] 15 bmp PEEP:  [6 cmH20] 6 cmH20  INTAKE / OUTPUT: I/O last 3 completed shifts: In: 1772.5 [I.V.:1672.5; IV Piggyback:100] Out: 2875 [Urine:2875]  PHYSICAL EXAMINATION: General:  Unresponsive female on BiPAP  HEENT:  BiPAP, MM pink and moist, Trace JVD Cardiovascular:  No rub, murmur or gallop, RRR Lungs:  Clear,rare faint wheeze,diminished per bases Abdomen:  Soft, non-tender to palpation, BS diminished Musculoskeletal:  Bilateral BKA with stump edema noted Skin:  Frail and thin, but intact  LABS:  BMET  Recent Labs Lab 09/13/16 0658 09/14/16 0221 09/15/16 0256  NA 138 138 139  K 5.0 5.2* 4.8  CL 105 106 104  CO2 24 24 27   BUN 66* 65* 69*  CREATININE 3.01* 2.91* 3.19*  GLUCOSE 67 103* 117*    Electrolytes  Recent Labs Lab 09/13/16 0658 09/14/16 0221 09/15/16 0256  CALCIUM 8.7* 8.5* 8.5*  MG 2.4 2.4  --   PHOS 6.1* 6.2*  --     CBC  Recent Labs Lab 09/13/16 0658 09/14/16 0221 09/15/16 0256  WBC 9.6 8.4 8.9  HGB 8.4* 8.1* 7.5*  HCT 28.4* 27.1* 24.6*  PLT 107* 88* 83*    Coag's No results for input(s): APTT, INR in the last 168 hours.  Sepsis Markers  Recent Labs Lab 08/24/2016 1815 09/14/2016 2100 08/22/2016 2217  LATICACIDVEN 0.39* 0.7 0.6  PROCALCITON  --  <0.10  --     ABG  Recent  Labs Lab 09/13/2016 2100 09/13/16 0413 09/15/16 0906  PHART 7.256* 7.264* 7.261*  PCO2ART 57.1* 54.7* 59.3*  PO2ART 73.5* 47.0* 72.0*    Liver Enzymes  Recent Labs Lab 08/22/2016 1650 09/15/16 0256  AST 34 22  ALT 22 18  ALKPHOS 84 67  BILITOT 0.6 0.8  ALBUMIN 3.0* 2.7*    Cardiac Enzymes  Recent Labs Lab 09/03/2016 2100 09/13/16 0208 09/13/16 0658  TROPONINI 0.03* 0.03* 0.04*    Glucose  Recent Labs Lab 09/14/16 0809 09/14/16 1505 09/14/16 2026 09/15/16 0018 09/15/16 0414 09/15/16 0842  GLUCAP 105* 136* 133* 135* 113* 112*    Imaging No results found.   STUDIES:   Echo:09/14/2016 LV: Normal cavity size EF 55-60% LA: Mildly dilated RV: Mildly dilated RA: Mildly dilated Poor imaging, no subcostal views  CULTURES: Bcx 2/24 > Flu PCR 2/24 > negative RVP 2/34 > Pending Urine Culture 2/24>> Enterobacter: sensitive to Amp/Vanc  ANTIBIOTICS: Vanco 2/24 > 2/26 Cefepime 2/24- 2/25 Maxipine 2/25>>  SIGNIFICANT EVENTS: 2/27: CCM re-consulted for worsening acute on chronic respiratory failure   LINES/TUBES: PIV  DISCUSSION: 71 year old with acute on chronic hypoxic and hypercarbic respiratory failure. Recent admission for UTI and pneumonia at [facility]. Treatment with BiPAP, ABX , gentle diuresis  For suspected recurrence of HCAP with volume overload. She was hemodynamically stable with normal Lactic acid and PCT. She initially showed some improvement, but 2/27 has become unresponsive with hypercarbic respiratory acidosis despite BiPAP 18/6,    ASSESSMENT / PLAN:  PULMONARY A: Acute on chronic hypercarbic, hypoxemic respiratory failure OSA/OHS on max BiPAP settings 18/6 Requiring 24/7 BiPAP  Unresponsive overnight P:   BiPAP settings have been maximized Continue Maxipine  Consider CXR  Titrate FiO2Maintain saturations> 92%   CARDIOVASCULAR A:  Volume overload H/O Pulmonary HTN, Diastolic heart failure Self resolving runs VT 2/27 early am Bradycardia early am 2/27 P:  Hold diuresis as creatinine 3.19 2/27 Consider 12 Lead Continued Tele monitoring   RENAL A:   AKI on CKD Hyperkalemia> resolved Worsening Creatinine 2/27 P:   Monitor urine output and Cr BMET daily to trend creatinine  Monitor I&O   GASTROINTESTINAL A:   Stable P:   NPO since last Friday per family Continue NPO as BIPAP dependent   Speech eval if improves and is liberated from BiPAP  HEMATOLOGIC A:   Mild anemia, thrombocytopenia WBC remains normal P:  Follow CBC Trend Fever Transfuse for HGB < 7  INFECTIOUS A:   Cover for HCAP Flu A and B  negative PCT< 0.10  P:   Continue Maxipine Follow cultures  ENDOCRINE A:   DM Hypoglycemia P:   SSI coverage. D5NS  NEUROLOGIC A:   Encephalopathy. Likely secondary to hypercarbia Worsening overnight 2/27 P: No sedating medications  FAMILY  - Updates: Daughter updated at bedside. Patient is DNR/DNI. Dr. Vaughan Browner  spoke with the patient and she confirms this 2/25. 2/27: I Spoke with daughter and son regarding next steps as patient is maximized BiPAP setting and continuing to worsen. We discussed that the patient does not want intubation or CPR, or HD. They agree with this. They want to make sure there are no additional measures that can be taken while following their mothers wishes. We discussed comfort care as an option. Dr. Elsworth Soho will continue this conversation with them today to ensure goals of care align with plan of care.  Magdalen Spatz, AGACNP-BC [place] Pager # [phone]  or [phone] 09/15/2016, 10:33 AM

## 2016-09-16 LAB — COMPREHENSIVE METABOLIC PANEL
ALBUMIN: 2.6 g/dL — AB (ref 3.5–5.0)
ALK PHOS: 64 U/L (ref 38–126)
ALT: 16 U/L (ref 14–54)
ANION GAP: 7 (ref 5–15)
AST: 20 U/L (ref 15–41)
BUN: 72 mg/dL — ABNORMAL HIGH (ref 6–20)
CALCIUM: 8.5 mg/dL — AB (ref 8.9–10.3)
CO2: 27 mmol/L (ref 22–32)
Chloride: 104 mmol/L (ref 101–111)
Creatinine, Ser: 3.12 mg/dL — ABNORMAL HIGH (ref 0.44–1.00)
GFR calc Af Amer: 16 mL/min — ABNORMAL LOW (ref >60)
GFR calc non Af Amer: 14 mL/min — ABNORMAL LOW (ref >60)
GLUCOSE: 108 mg/dL — AB (ref 65–99)
POTASSIUM: 4.8 mmol/L (ref 3.5–5.1)
SODIUM: 138 mmol/L (ref 135–145)
Total Bilirubin: 0.8 mg/dL (ref 0.3–1.2)
Total Protein: 6.2 g/dL — ABNORMAL LOW (ref 6.5–8.1)

## 2016-09-16 LAB — CBC
HEMATOCRIT: 26.6 % — AB (ref 36.0–46.0)
HEMOGLOBIN: 7.9 g/dL — AB (ref 12.0–15.0)
MCH: 25.9 pg — AB (ref 26.0–34.0)
MCHC: 29.7 g/dL — AB (ref 30.0–36.0)
MCV: 87.2 fL (ref 78.0–100.0)
Platelets: 79 10*3/uL — ABNORMAL LOW (ref 150–400)
RBC: 3.05 MIL/uL — ABNORMAL LOW (ref 3.87–5.11)
RDW: 19.1 % — ABNORMAL HIGH (ref 11.5–15.5)
WBC: 7.4 10*3/uL (ref 4.0–10.5)

## 2016-09-16 LAB — HEMOGLOBIN A1C
Hgb A1c MFr Bld: 5.6 % (ref 4.8–5.6)
Mean Plasma Glucose: 114 mg/dL

## 2016-09-16 LAB — GLUCOSE, CAPILLARY
Glucose-Capillary: 101 mg/dL — ABNORMAL HIGH (ref 65–99)
Glucose-Capillary: 109 mg/dL — ABNORMAL HIGH (ref 65–99)
Glucose-Capillary: 123 mg/dL — ABNORMAL HIGH (ref 65–99)

## 2016-09-16 MED ORDER — GLYCOPYRROLATE 0.2 MG/ML IJ SOLN
0.4000 mg | Freq: Three times a day (TID) | INTRAMUSCULAR | Status: DC
  Administered 2016-09-16 – 2016-09-18 (×5): 0.4 mg via INTRAVENOUS
  Filled 2016-09-16 (×5): qty 2

## 2016-09-16 MED ORDER — MORPHINE SULFATE (PF) 2 MG/ML IV SOLN
2.0000 mg | INTRAVENOUS | Status: DC | PRN
  Administered 2016-09-16: 2 mg via INTRAVENOUS
  Administered 2016-09-18 (×2): 1 mg via INTRAVENOUS
  Filled 2016-09-16 (×2): qty 1

## 2016-09-16 NOTE — Progress Notes (Signed)
External female foley discontinued due to no active order.  Pt tolerated well.  No urine noted upon discontinuation.

## 2016-09-16 NOTE — Progress Notes (Signed)
[place] TEAM 1 - [place]  SLH:[id number] DOB: [birth year] DOA: 09/10/2016 PCP: Marco Collie, MD    Brief Narrative:  71 year old with history of CHF, DM, OSA/OHS, B LE amputations, and pulmonary HTN who was sent from her nursing home with O2 sats 68%. Workup in the ED was significant for ABG 7.22/61/60. Creatinine of 3.09 up from baseline of 1.8, K 5.7, hemoglobin 8.4, platelets 111.  She was previously admitted to [facility] for pneumonia and UTI and discharged on February 14 to a SNF. The family noted increasing stump edema and increase in weight by 20 lbs over a few weeks.   Subjective: The pt has declined significantly since my last visit.  Palliative Care has met w/ the family and the decision has been made to pursue comfort care only.    Assessment & Plan:  Acute on chronic hypercarbic, hypoxemic respiratory failure due to Pulmonary HTN and Acute exacerbation of Diastolic heart failure  Dry weight appears to be as low as 101kg - EF 55-60% w/ grade 2 DD as as of Sept 2016 - TTE this admit w/ EF 55-60% but unable to provide further detail   Filed Weights   09/14/16 0207 09/15/16 0405 09/16/16 0656  Weight: 114.3 kg (251 lb 14.4 oz) 113.9 kg (251 lb) 116.8 kg (257 lb 8 oz)     OSA/OHS on BiPAP  Recent Enterobacter UTI It should be noted that UA at time of this admit, as well as urine culture, were both unremarkable  AKI on CKD crt 1.28 Jul 2015   Recent Labs Lab 09/13/16 0208 09/13/16 0658 09/14/16 0221 09/15/16 0256 09/16/16 0347  CREATININE 3.12* 3.01* 2.91* 3.19* 3.12*   HTN  Hyperkalemia  DM   Acute metabolic Encephalopathy likely secondary to hypercarbia    DVT prophylaxis: SQ heparin  Code Status: DNR - NO CODE Family Communication: spoke w/ multiple family members at bedside Disposition Plan: transition to comfort care focus  Consultants:  PCCM   Antimicrobials:  Vanco 2/24  Cefepime 2/24 > 2/27  Objective: Blood  pressure (!) 152/55, pulse 60, temperature 98 F (36.7 C), temperature source Axillary, resp. rate 20, height _0  (1.245 m), weight 116.8 kg (257 lb 8 oz), SpO2 100 %.  Intake/Output Summary (Last 24 hours) at 09/16/16 0940 Last data filed at 09/16/16 0700  Gross per 24 hour  Intake              650 ml  Output              250 ml  Net              400 ml   Filed Weights   09/14/16 0207 09/15/16 0405 09/16/16 0656  Weight: 114.3 kg (251 lb 14.4 oz) 113.9 kg (251 lb) 116.8 kg (257 lb 8 oz)    Examination: Respirations are nearly agonal - no evidence of anxiety or discomfort  CBC:  Recent Labs Lab 09/05/2016 1650 08/31/2016 2100 09/13/16 0658 09/14/16 0221 09/15/16 0256 09/16/16 0347  WBC 9.9 8.2 9.6 8.4 8.9 7.4  NEUTROABS 8.3*  --   --   --   --   --   HGB 8.4* 9.1* 8.4* 8.1* 7.5* 7.9*  HCT 28.2* 30.1* 28.4* 27.1* 24.6* 26.6*  MCV 87.6 87.5 87.1 88.0 86.9 87.2  PLT 111* 121* 107* 88* 83* 79*   Basic Metabolic Panel:  Recent Labs Lab 09/13/16 0208 09/13/16 1572 09/14/16 0221 09/15/16 0256 09/16/16 0347  NA  138 138 138 139 138  K 5.1 5.0 5.2* 4.8 4.8  CL 103 105 106 104 104  CO2 _0 GLUCOSE 73 67 103* 117* 108*  BUN 64* 66* 65* 69* 72*  CREATININE 3.12* 3.01* 2.91* 3.19* 3.12*  CALCIUM 8.7* 8.7* 8.5* 8.5* 8.5*  MG  --  2.4 2.4  --   --   PHOS  --  6.1* 6.2*  --   --    GFR: Estimated Creatinine Clearance: 15.1 mL/min (by C-G formula based on SCr of 3.12 mg/dL (H)).  Liver Function Tests:  Recent Labs Lab 09/16/2016 1650 09/15/16 0256 09/16/16 0347  AST 34 22 20  ALT _1 ALKPHOS 84 67 64  BILITOT 0.6 0.8 0.8  PROT 6.9 5.8* 6.2*  ALBUMIN 3.0* 2.7* 2.6*    Cardiac Enzymes:  Recent Labs Lab 08/20/2016 1650 08/23/2016 2100 09/13/16 0208 09/13/16 0658  TROPONINI <0.03 0.03* 0.03* 0.04*    HbA1C: Hgb A1c MFr Bld  Date/Time Value Ref Range Status  09/15/2016 02:56 AM 5.6 4.8 - 5.6 % Final    Comment:    (NOTE)          Pre-diabetes: 5.7 - 6.4         Diabetes: >6.4         Glycemic control for adults with diabetes: <7.0   06/17/2013 05:30 AM 10.2 (H) <5.7 % Final    Comment:    (NOTE)                                                                       According to the ADA Clinical Practice Recommendations for 2011, when HbA1c is used as a screening test:  >=6.5%   Diagnostic of Diabetes Mellitus           (if abnormal result is confirmed) 5.7-6.4%   Increased risk of developing Diabetes Mellitus References:Diagnosis and Classification of Diabetes Mellitus,Diabetes MLJQ,4920,10(OFHQR 1):S62-S69 and Standards of Medical Care in         Diabetes - 2011,Diabetes Care,2011,34 (Suppl 1):S11-S61.    CBG:  Recent Labs Lab 09/15/16 1617 09/15/16 2147 09/16/16 0014 09/16/16 0442 09/16/16 0739  GLUCAP 111* 120* 123* 101* 109*    Recent Results (from the past 240 hour(s))  Culture, blood (routine x 2)     Status: None (Preliminary result)   Collection Time: 09/10/2016  4:26 PM  Result Value Ref Range Status   Specimen Description BLOOD RIGHT HAND  Final   Special Requests BOTTLES DRAWN AEROBIC AND ANAEROBIC 5CC  Final   Culture NO GROWTH 3 DAYS  Final   Report Status PENDING  Incomplete  Culture, blood (routine x 2)     Status: None (Preliminary result)   Collection Time: 08/27/2016  6:08 PM  Result Value Ref Range Status   Specimen Description BLOOD LEFT HAND  Final   Special Requests BOTTLES DRAWN AEROBIC ONLY [place]  Final   Culture NO GROWTH 3 DAYS  Final   Report Status PENDING  Incomplete  Urine culture     Status: None   Collection Time: 08/28/2016  6:43 PM  Result Value Ref Range Status   Specimen Description URINE, RANDOM  Final   Special Requests ADDED  0201 09/13/16  Final   Culture NO GROWTH  Final   Report Status 09/14/2016 FINAL  Final  Respiratory Panel by PCR     Status: None   Collection Time: 09/01/2016  9:13 PM  Result Value Ref Range Status   Adenovirus NOT DETECTED NOT DETECTED  Final   Coronavirus 229E NOT DETECTED NOT DETECTED Final   Coronavirus HKU1 NOT DETECTED NOT DETECTED Final   Coronavirus NL63 NOT DETECTED NOT DETECTED Final   Coronavirus OC43 NOT DETECTED NOT DETECTED Final   Metapneumovirus NOT DETECTED NOT DETECTED Final   Rhinovirus / Enterovirus NOT DETECTED NOT DETECTED Final   Influenza A NOT DETECTED NOT DETECTED Final   Influenza B NOT DETECTED NOT DETECTED Final   Parainfluenza Virus 1 NOT DETECTED NOT DETECTED Final   Parainfluenza Virus 2 NOT DETECTED NOT DETECTED Final   Parainfluenza Virus 3 NOT DETECTED NOT DETECTED Final   Parainfluenza Virus 4 NOT DETECTED NOT DETECTED Final   Respiratory Syncytial Virus NOT DETECTED NOT DETECTED Final   Bordetella pertussis NOT DETECTED NOT DETECTED Final   Chlamydophila pneumoniae NOT DETECTED NOT DETECTED Final   Mycoplasma pneumoniae NOT DETECTED NOT DETECTED Final  MRSA PCR Screening     Status: None   Collection Time: 09/13/2016  9:13 PM  Result Value Ref Range Status   MRSA by PCR NEGATIVE NEGATIVE Final    Comment:        The GeneXpert MRSA Assay (FDA approved for NASAL specimens only), is one component of a comprehensive MRSA colonization surveillance program. It is not intended to diagnose MRSA infection nor to guide or monitor treatment for MRSA infections.      Scheduled Meds: . chlorhexidine  15 mL Mouth Rinse BID  . glycopyrrolate  0.4 mg Intravenous TID  . haloperidol lactate  5 mg Intravenous Once  . mouth rinse  15 mL Mouth Rinse q12n4p     LOS: 4 days   Cherene Altes, MD [facility] Office  [phone] Pager - Text Page per Amion as per below:  On-Call/Text Page:      [email or web address]      password [email or web address]  If 7PM-7AM, please contact night-coverage [email or web address] Password [facility] 09/16/2016, 9:40 AM

## 2016-09-16 NOTE — Progress Notes (Signed)
Pt has been titrated from the NIV machine to a NRB then further titrated to a partial re-breather mask. Pt is tolerating the titration well. Pt is in no distress at this time and patient respiratory/breathing pattern is normal no accessory muscle usage noted. Pt is neurologically stable at this time. Family is at bedside. RRT explain in great detail of exactly what I was doing far as the titration from one delivery device to the PRB. All questions answered. Family stated no further questions at this time. RN aware

## 2016-09-16 NOTE — Progress Notes (Signed)
Report given to Ria Comment, RN and pt transferred to [phone] accompanied by her family.

## 2016-09-16 NOTE — Consult Note (Signed)
Consultation Note Date: 09/16/2016   Patient Name: [place] Hollin  DOB: 10-Oct-1945  MRN: [id number]  Age / Sex: 71 y.o., female  PCP: Marco Collie, MD Referring Physician: Cherene Altes, MD  Reason for Consultation: Establishing goals of care and Psychosocial/spiritual support  HPI/Patient Profile: 71 y.o. female  admitted on 08/20/2016 with  71 year old with past medical history of CHF, diabetes, OSA/OHS, pulmonary hypertension.   Sent from the nursing home with hypoxia, sats and 68%.  Workup in the ED significant for ABG 7.22/61/60/89%. Creatinine of 3.09 up from baseline of 1.8, K 5.7, hemoglobin 8.4, platelets 111. LA is normal at 0.3.  She has recent admission at [facility] for double pneumonia and UTI. Discharged on February 14 to a skilled nursing facility.  The family notes increasing stump edema. She had been unable to put on her prosthesis due to this. She also noted to have increase in weight by 20 lbs over the past few weeks. Her nursing home switched lasix to torsemide.  Acute on chronic hypercarbic, hypoxemic respiratory failure due to Pulmonary HTN and Acute exacerbation of Diastolic heart failure.  Currently unresponsive and transitioning at EOL  Clinical Assessment and Goals of Care:  This NP Wadie Lessen reviewed medical records, received report from team, assessed the patient and then meet at the patient's bedside along with her family to include her daughter and several grands and a sisiter  to discuss diagnosis, prognosis, GOC, EOL wishes disposition and options.  A  discussion was had today regarding advanced directives.  Concepts specific to code status, artifical feeding and hydration, continued IV antibiotics and rehospitalization was had.  The difference between a aggressive medical intervention path  and a palliative comfort care path for this patient at this time was had.   Values and goals of care important to patient and family were attempted to be elicited.  Concept of  Palliative Care was discussed  Natural trajectory and expectations at EOL were discussed.  Questions and concerns addressed.   Family encouraged to call with questions or concerns.  PMT will continue to support holistically.  SUMMARY OF RECOMMENDATIONS    Code Status/Advance Care Planning:  DNR    Symptom Management:   Pain/Dyspnea: Morphine 1 mg IV every 1 hr prn  Agitation: Haldol  Terminal secretions: Rubinol 0.4 mg tid  Palliative Prophylaxis:   Frequent Pain Assessment, Oral Care and Turn Reposition  Additional Recommendations (Limitations, Scope, Preferences):  Full Comfort Care  Psycho-social/Spiritual:   Desire for further Chaplaincy support:no  Additional Recommendations: Grief/Bereavement Support  Prognosis:   Hours - Days  Discharge Planning: Anticipated Hospital Death      Primary Diagnoses: Present on Admission: . Acute respiratory failure ([place])   I have reviewed the medical record, interviewed the patient and family, and examined the patient. The following aspects are pertinent.  Past Medical History:  Diagnosis Date  . Anxiety   . Arthritis   . Cancer ([place])    hx of colon CA 2000  . Carotid stenosis  u/s 6/10: R 0-39%Ll 40-59%;  u/s 7/12: 40-59% bilateral (repeat in 7/13)  . CHF (congestive heart failure) (H[facility])    hx of EF 15-20% in 4/08,  echo 3/09 EF 60-65%;   echo 6/12: EF 55-60%, mild LAE  . Complication of anesthesia    Oxygen drops   . DM (diabetes mellitus) ([place])   . Family history of anesthesia complication    Mother N/V  . Head injury, acute, without loss of consciousness   . History of blood transfusion   . HTN (hypertension)   . Hypokalemia   . Morbid obesity ([place])   . Obesity hypoventilation syndrome ([place])   . OSA (obstructive sleep apnea)   . 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  PONV (postoperative nausea and vomiting)    after colon surgery 2000  . Pulmonary HTN    mild,  cath 6/08 with PVR 5.1  . Spinal stenosis   . Thrombophlebitis of deep vein of leg [facility])    Social History   Social History  . Marital status: Married    Spouse name: N/A  . Number of children: 3  . Years of education: N/A   Occupational History  . DISABLED Disability   Social History Main Topics  . Smoking status: Never Smoker  . Smokeless tobacco: Never Used  . Alcohol use No  . Drug use: No  . Sexual activity: No   Other Topics Concern  . None   Social History Narrative  . None   Family History  Problem Relation Age of Onset  . Coronary artery disease Father    Scheduled Meds: . chlorhexidine  15 mL Mouth Rinse BID  . glycopyrrolate  0.4 mg Intravenous TID  . haloperidol lactate  5 mg Intravenous Once  . mouth rinse  15 mL Mouth Rinse q12n4p   Continuous Infusions: PRN Meds:.morphine injection Medications Prior to Admission:  Prior to Admission medications   Medication Sig Start Date End Date Taking? Authorizing Provider  Amino Acids-Protein Hydrolys (PRO-STAT) LIQD Take 30 mLs by mouth every morning.   Yes Historical Provider, MD  amLODipine (NORVASC) 5 MG tablet Take 5 mg by mouth at bedtime.    Yes Historical Provider, MD  aspirin (BAYER ASPIRIN) 325 MG tablet Take 325 mg by mouth daily.     Yes Historical Provider, MD  bumetanide (BUMEX) 1 MG tablet Take 1 mg by mouth 2 (two) times daily. 0600 and 1400   Yes Historical Provider, MD  carvedilol (COREG) 3.125 MG tablet Take 3.125 mg by mouth 2 (two) times daily with a meal.    Yes Historical Provider, MD  febuxostat (ULORIC) 40 MG tablet Take 40 mg by mouth daily.   Yes Historical Provider, MD  ferrous sulfate 325 (65 FE) MG tablet Take 325 mg by mouth 2 (two) times a week. [place]   Yes Historical Provider, MD  insulin aspart (NOVOLOG) 100 UNIT/ML injection Inject 4-10 Units into the skin 4 (four)  times daily -  before meals and at bedtime. For BGL >200: 201-250 = 4 units; 251-300 = 6 units; 301-350 = 8 units; 351-400 = 10 units; Call MD for BGL >400 or <60   Yes Historical Provider, MD  ipratropium-albuterol (DUONEB) 0.5-2.5 (3) MG/3ML SOLN Take 3 mLs by nebulization every 6 (six) hours. AND EVERY 2 HOURS AS NEEDED FOR WHEEZING   Yes Historical Provider, MD  LEVEMIR 100 UNIT/ML injection Inject 22-50 Units into the  skin See admin instructions. 50 units in the morning and 22 units at bedtime 12/01/14  Yes Historical Provider, MD  metolazone (ZAROXOLYN) 5 MG tablet Take 2.5 mg by mouth every morning.    Yes Historical Provider, MD  potassium chloride SA (K-DUR,KLOR-CON) 20 MEQ tablet Take 20 mEq by mouth daily. 01/11/13  Yes Amy D Clegg, NP  pravastatin (PRAVACHOL) 40 MG tablet Take 1 tablet by mouth daily.   Yes Historical Provider, MD  sertraline (ZOLOFT) 50 MG tablet Take 50 mg by mouth daily.  07/23/15  Yes Historical Provider, MD  acetaminophen (TYLENOL) 500 MG tablet Take 500 mg by mouth every 6 (six) hours as needed for mild pain.     Historical Provider, MD  allopurinol (ZYLOPRIM) 300 MG tablet Take 300 mg by mouth daily.    Historical Provider, MD  docusate sodium (COLACE) 100 MG capsule Take 100 mg by mouth 2 (two) times daily.    Historical Provider, MD  insulin lispro (HUMALOG) 100 UNIT/ML injection Inject 2-4 Units into the skin 4 (four) times daily -  before meals and at bedtime. For BGL >200: 201-250 = 4 units; 251-300 = 6 units; 301-350 = 8 units; 351-400 = 10 units; Call MD for BGL >400 or <60    Historical Provider, MD  losartan (COZAAR) 25 MG tablet Take 25 mg by mouth daily. 07/29/16   Historical Provider, MD  torsemide (DEMADEX) 20 MG tablet Take 60 mg by mouth every morning.     Historical Provider, MD  Vitamin D, Ergocalciferol, (DRISDOL) 50000 UNITS CAPS Take 50,000 Units by mouth 2 (two) times a week. Take on Mon and Thurs.    Historical Provider, MD   Allergies  Allergen  Reactions  . Aldactone [Spironolactone] Other (See Comments)    ARF (acute renal failure)  . Bactrim [Sulfamethoxazole-Trimethoprim] Hives  . Codeine Other (See Comments)    Causes confusion  . Meperidine Hcl Other (See Comments)    Increase in heart rate (demerol)  . Metformin Nausea And Vomiting  . Tape Other (See Comments)    THE PATIENT'S SKIN IS VERY THIN AND WILL TEAR AND BRUISE VERY EASILY!!   Review of Systems  Unable to perform ROS: Acuity of condition    Physical Exam  Constitutional: She appears well-developed.  HENT:  Audible throat secretions  Cardiovascular: Normal rate, regular rhythm and normal heart sounds.   Pulmonary/Chest: She has decreased breath sounds.  Neurological: She is unresponsive.  Skin: Skin is warm and dry.    Vital Signs: BP (!) 152/55   Pulse 60   Temp 98 F (36.7 C) (Axillary)   Resp 20   Ht 4\' 1"  (1.245 m)   Wt 116.8 kg (257 lb 8 oz)   SpO2 100%   BMI 75.40 kg/m  Pain Assessment: FLACC   Pain Score: Asleep   SpO2: SpO2: 100 % O2 Device:SpO2: 100 % O2 Flow Rate: .O2 Flow Rate (L/min): 13 L/min  IO: Intake/output summary:  Intake/Output Summary (Last 24 hours) at 09/16/16 0950 Last data filed at 09/16/16 0700  Gross per 24 hour  Intake              650 ml  Output              250 ml  Net              400 ml    LBM: Last BM Date:  (PTA) Baseline Weight: Weight: 114.9 kg (253 lb 4.9 oz) Most recent weight:  Weight: 116.8 kg (257 lb 8 oz)     Palliative Assessment/Data: 10%   Flowsheet Rows   Flowsheet Row Most Recent Value  Intake Tab  Referral Department  Critical care  Unit at Time of Referral  ICU  Palliative Care Primary Diagnosis  Pulmonary  Date Notified  09/15/16  Palliative Care Type  New Palliative care  Reason for referral  Clarify Goals of Care  Date of Admission  09/07/2016  # of days IP prior to Palliative referral  3  Clinical Assessment  Psychosocial & Spiritual Assessment  Palliative Care Outcomes      Discussed with Dr Thereasa Solo and nursing  Time In: 0910 Time Out: 1025 Time Total: 75 min Greater than 50%  of this time was spent counseling and coordinating care related to the above assessment and plan.  Signed by: Wadie Lessen, NP   Please contact Palliative Medicine Team phone at [phone] for questions and concerns.  For individual provider: See Shea Evans

## 2016-09-16 NOTE — Progress Notes (Signed)
Pt rested well throughout night.  0 s/s of respiratory distress noted throughout shift.  Tolerated partial re-breather mask since approx midnight with o2 sats 98-100%.  Of note, 0 urine production since start of shift.  Dayshift RN notified and to update team.  Bedside report given to oncoming RN.  Daughter and sister stayed overnight with pt and remain at bedside.  No needs at this time.

## 2016-09-16 NOTE — Care Management Important Message (Signed)
Important Message  Patient Details  Name: [name] MRN: [id number] Date of Birth: 03/27/1946   Medicare Important Message Given:  Yes    Nathen May 09/16/2016, 10:27 AM

## 2016-09-17 DIAGNOSIS — R0609 Other forms of dyspnea: Secondary | ICD-10-CM

## 2016-09-17 DIAGNOSIS — Z515 Encounter for palliative care: Secondary | ICD-10-CM

## 2016-09-17 DIAGNOSIS — J9601 Acute respiratory failure with hypoxia: Secondary | ICD-10-CM

## 2016-09-17 DIAGNOSIS — N289 Disorder of kidney and ureter, unspecified: Secondary | ICD-10-CM

## 2016-09-17 LAB — CULTURE, BLOOD (ROUTINE X 2)
CULTURE: NO GROWTH
Culture: NO GROWTH

## 2016-09-17 MED ORDER — MORPHINE SULFATE 50 MG/ML IV SOLN
0.5000 mg/h | INTRAVENOUS | Status: DC
  Administered 2016-09-17: 0.5 mg/h via INTRAVENOUS
  Filled 2016-09-17: qty 5
  Filled 2016-09-17: qty 10

## 2016-09-17 NOTE — Progress Notes (Signed)
Daily Progress Note   Patient Name: [name]       Date: 09/17/2016 DOB: October 20, 1945  Age: 71 y.o. MRN#: [id number] Attending Physician: Mendel Corning, MD Primary Care Physician: Marco Collie, MD Admit Date: 09/14/2016  Reason for Consultation/Follow-up: Establishing goals of care, Non pain symptom management, Pain control, Psychosocial/spiritual support and Terminal Care  Subjective:  -continued conversation at bedside with family as f/u to yesterdays' meeting.  All are at peace with the focus on comfort understanding that time is limited  Natrual trajectory and expectations at EOL discussed  - Questions and concerns addressed  -emotional support offered  Length of Stay: 5  Current Medications: Scheduled Meds:  . chlorhexidine  15 mL Mouth Rinse BID  . glycopyrrolate  0.4 mg Intravenous TID  . haloperidol lactate  5 mg Intravenous Once  . mouth rinse  15 mL Mouth Rinse q12n4p    Continuous Infusions: . morphine 0.5 mg/hr (09/17/16 0834)    PRN Meds: morphine injection  Physical Exam  Constitutional: She appears ill.  HENT:  Mouth/Throat: Mucous membranes are dry.  Cardiovascular: Normal rate, regular rhythm and normal heart sounds.   Pulmonary/Chest: She has decreased breath sounds.  Neurological: She is unresponsive.  Skin: Skin is warm and dry.            Vital Signs: BP (!) 136/51 (BP Location: Left Arm)   Pulse 71   Temp 97.4 F (36.3 C) (Oral)   Resp 15   Ht 4\' 1"  (1.245 m)   Wt 116.8 kg (257 lb 8 oz)   SpO2 97%   BMI 75.40 kg/m  SpO2: SpO2: 97 % O2 Device: O2 Device: Not Delivered O2 Flow Rate: O2 Flow Rate (L/min): 13 L/min  Intake/output summary:  Intake/Output Summary (Last 24 hours) at 09/17/16 1525 Last data filed at 09/17/16 0525  Gross  per 24 hour  Intake                0 ml  Output                0 ml  Net                0 ml   LBM: Last BM Date:  (PTA) Baseline Weight: Weight: 114.9 kg (253 lb 4.9 oz) Most recent weight: Weight:  116.8 kg (257 lb 8 oz)       Palliative Assessment/Data:  10%   Flowsheet Rows   Flowsheet Row Most Recent Value  Intake Tab  Referral Department  Hospitalist  Unit at Time of Referral  Med/Surg Unit  Palliative Care Primary Diagnosis  Pulmonary  Date Notified  09/15/16  Palliative Care Type  Dana Palliative care  Reason for referral  Clarify Goals of Care  Date of Admission  09/11/16  Date first seen by Palliative Care  09/16/16  # of days Palliative referral response time  1 Day(s)  # of days IP prior to Palliative referral  4  Clinical Assessment  Palliative Performance Scale Score  10%  Psychosocial & Spiritual Assessment  Palliative Care Outcomes      Patient Active Problem List   Diagnosis Date Noted  . Renal insufficiency   . AKI (acute kidney injury) ([place])   . HCAP (healthcare-associated pneumonia)   . Acute respiratory failure ([place]) 08/26/2016  . Chronic respiratory failure ([place]) 05/16/2015  . S/P BKA (below knee amputation) bilateral ([place]) 05/04/2014  . Normocytic anemia 06/17/2013  . Thrombocytopenia, unspecified 06/17/2013  . Leukocytosis, unspecified 06/17/2013  . Osteomyelitis of foot, left, acute ([name]) 06/16/2013  . Osteomyelitis of ankle or foot ([place]) 06/16/2013  . Fatigue 01/17/2013  . Gout attack 12/15/2012  . Acute kidney failure ([place]) 09/10/2012  . Hyperkalemia 09/10/2012  . Acute on chronic renal failure ([place]) 04/27/2012  . UTI (urinary tract infection) 04/22/2012  . Cardiorenal syndrome 04/22/2012  . Hypokalemia 04/22/2012  . Hypertension 09/21/2011  . Myositis 04/22/2011  . Denervation atrophy of muscle 04/22/2011  . Diabetic foot ulcer ([place]) 04/08/2011  . Non-pressure chronic ulcer of right ankle, limited to breakdown of skin ([place])  04/08/2011  . Chronic diastolic heart failure ([place]) 03/17/2011  . Acute on chronic diastolic heart failure ([place]) 03/11/2011  . Cough 03/03/2011  . Cellulitis 02/02/2011  . Edema 12/29/2010  . PALPITATIONS 04/22/2010  . ABNORMAL HEART RHYTHMS 03/14/2010  . Carotid artery stenosis 12/24/2009  . PULMONARY HYPERTENSION, SECONDARY 01/09/2009  . CARDIOMYOPATHY, PRIMARY, DILATED 01/09/2009  . CAROTID BRUIT 01/09/2009  . Pure hypercholesterolemia 12/31/2008  . Obesity hypoventilation syndrome ([place]) 10/31/2007  . DM 05/05/2007  . Obstructive sleep apnea 05/05/2007  . HYPERTENSION 05/05/2007  . CONGESTIVE HEART FAILURE 05/05/2007  . DEEP VENOUS THROMBOPHLEBITIS 05/05/2007    Palliative Care Assessment & Plan   Patient Profile: 71 y.o. female  admitted on 09/07/2016 with  71 year old with past medical history of CHF, diabetes, OSA/OHS, pulmonary hypertension.   Sent from the nursing home with hypoxia, sats and 68%.  Workup in the ED significant for ABG 7.22/61/60/89%. Creatinine of 3.09 up from baseline of 1.8, K 5.7, hemoglobin 8.4, platelets 111. LAis normal at 0.3.  [facility] and [facility].   Discharged on February 14 to a skilled nursing facility.  The family notes increasing stump edema. She had been unable to put on her prosthesis due to this. She also noted to have increase in weight by 20 lbs over the past few weeks. Her nursing home switched lasix to torsemide.  Acute on chronic hypercarbic, hypoxemic respiratory failure due to Pulmonary HTN and Acute exacerbation of Diastolic heart failure.  Currently unresponsive and transitioning at EOL   Assessment: -transitioning at EOL  Recommendations/Plan:  Comfort and dignity  Morphine gtt initiated by attending  Goals of Care and Additional Recommendations:  Full comfort   Code Status:  Code Status Orders        Start     Ordered   09/16/2016 1932  Do not  attempt resuscitation (DNR)  Continuous    Question Answer Comment  In the event of cardiac or respiratory ARREST Do not call a "code blue"   In the event of cardiac or respiratory ARREST Do not perform Intubation, CPR, defibrillation or ACLS   In the event of cardiac or respiratory ARREST Use medication by any route, position, wound care, and other measures to relive pain and suffering. May use oxygen, suction and manual treatment of airway obstruction as needed for comfort.      09/13/2016 1934    Code Status History    Date Active Date Inactive Code Status Order ID Comments User Context   05/04/2014  4:58 PM 05/07/2014  6:08 PM Full Code [id number]  Newt Minion, MD Inpatient   06/18/2013  9:42 AM 06/20/2013  5:29 PM Full Code [id number]  Newt Minion, MD Inpatient   06/16/2013  9:00 PM 06/18/2013  9:42 AM Full Code [id number]  Theressa Millard, MD Inpatient   09/10/2012  4:48 AM 09/12/2012  4:25 PM Full Code [id number]  Etta Quill, DO ED    Advance Directive Documentation   Flowsheet Row Most Recent Value  Type of Advance Directive  Living will  Pre-existing out of facility DNR order (yellow form or pink MOST form)  No data  "MOST" Form in Place?  No data       Prognosis: Hours to days  Discharge Planning: Expect hospital death  Care plan was discussed with bedside nursing and family  Thank you for allowing the Palliative Medicine Team to assist in the care of this patient.   Time In: 0820 Time Out: 0845 Total Time 25 min Prolonged Time Billed  no       Greater than 50%  of this time was spent counseling and coordinating care related to the above assessment and plan.  Wadie Lessen, NP  Please contact Palliative Medicine Team phone at [phone] for questions and concerns.

## 2016-09-17 NOTE — Progress Notes (Signed)
[facility]                                                                              Patient Demographics  [name], is a 71 y.o. female, DOB - Mar 25, 1946, [id number]  Admit date - 09/11/2016   Admitting Physician Marshell Garfinkel, MD  Outpatient Primary MD for the patient is [facility], [place]  Outpatient specialists:   LOS - 5  days    Chief Complaint  Patient presents with  . Altered Mental Status       Brief summary  71 year old with history of CHF, DM, OSA/OHS, B LE amputations, and pulmonary HTN who was sent from her nursing home with O2 sats 68%. Workup in the ED was significant for ABG 7.22/61/60. Creatinine of 3.09 up from baseline of 1.8, K 5.7, hemoglobin 8.4, platelets 111.  She was previously admitted to [facility] for pneumonia and UTI and discharged on February 14 to a SNF. The family noted increasing stump edema and increase in weight by 20 lbs over a few weeks.    Assessment & Plan    Acute on chronic hypercarbic, hypoxemic respiratory failure due to Pulmonary HTN and Acute exacerbation of Diastolic heart failure  Dry weight appears to be as low as 101kg - EF 55-60% w/ grade 2 DD as as of Sept 2016  - 2-D echo this admission showed EF of 55-60% - Initially admitted by critical care service on 2/24, Patient was placed on BiPAP, treated with antibiotics, gentle diuresis. - She initially showed some improvement however on 2/27 became unresponsive with hypercarbia and respiratory acidosis despite being on BiPAP settings 18/6.  - Influenza negative, she was placed on antibiotics to cover for HCAP. Due to diuresis, creatinine continued to worsen. - Pulmonary critical care discussed with patient's family in detail regarding poor prognosis and per patient's wishes, patient did not want intubation or CPR or hemodialysis. Family requested comfort care as an option. On 2/28, patient was titrated to NRB. Patient was transferred to [facility]. -Palliative medicine was also consulted. Patient was started on symptom management with morphine, Haldol and Robinul. Patient was placed on comfort care goals per patient's family's wishes.    OSA/OHS on BiPAP  Recent Enterobacter UTI It should be noted that UA at time of this admit, as well as urine culture, were both unremarkable  AKI on CKD  HTN - Comfort care goals    DM - Currently on comfort care goals   Acute metabolic Encephalopathy likely secondary to hypercarbia On morphine drip for comfort care goals ,    Code Status:DO NOT RESUSCITATE  DVT Prophylaxis:  L Family Communication: Discussed in detail with the patient, all imaging results, lab results explained to the patient's daughter, multiple other family members in the room    Disposition Plan: Comfort care goals Time Spent in minutes  15 minutes  Procedures:    Consultants:   Patient was admitted by critical care service Palliative medicine   Antimicrobials :      Medications  Scheduled Meds: . chlorhexidine  15 mL Mouth Rinse BID  . glycopyrrolate  0.4 mg Intravenous TID  .  haloperidol lactate  5 mg Intravenous Once  . mouth rinse  15 mL Mouth Rinse q12n4p   Continuous Infusions: . morphine 0.5 mg/hr (09/17/16 0834)   PRN Meds:.morphine injection   Antibiotics   Anti-infectives    Start     Dose/Rate Route Frequency Ordered Stop   09/14/16 2000  vancomycin (VANCOCIN) 1,500 mg in sodium chloride 0.9 % 500 mL IVPB  Status:  Discontinued     1,500 mg 250 mL/hr over 120 Minutes Intravenous Every 48 hours 09/13/16 1222 09/14/16 1114   09/13/16 2000  ceFEPIme (MAXIPIME) 1 g in dextrose 5 % 50 mL IVPB  Status:  Discontinued     1 g 100 mL/hr over 30 Minutes Intravenous Every 24 hours 09/13/16 1222 09/16/16 0934   08/27/2016 1900  ceFEPIme (MAXIPIME) 2 g in dextrose 5 % 50 mL IVPB     2 g 100 mL/hr over 30 Minutes Intravenous  Once 09/09/2016 1826 09/15/2016 2000   08/26/2016  1900  vancomycin (VANCOCIN) 2,000 mg in sodium chloride 0.9 % 500 mL IVPB     2,000 mg 250 mL/hr over 120 Minutes Intravenous  Once 09/03/2016 1826 08/25/2016 2029        Subjective:   [name] was seen and examined today.  Appears to be restless, reaching out for things in the air, family members at the bedside. No fevers.   Objective:   Vitals:   09/16/16 0451 09/16/16 0656 09/16/16 0743 09/16/16 1252  BP: (!) 96/45  (!) 152/55 (!) 136/51  Pulse: 60   71  Resp: 20   15  Temp: 97.8 F (36.6 C)  98 F (36.7 C) 97.4 F (36.3 C)  TempSrc: Axillary  Axillary Oral  SpO2: 100%   97%  Weight:  116.8 kg (257 lb 8 oz)    Height:        Intake/Output Summary (Last 24 hours) at 09/17/16 1345 Last data filed at 09/17/16 0525  Gross per 24 hour  Intake                0 ml  Output                0 ml  Net                0 ml     Wt Readings from Last 3 Encounters:  09/16/16 116.8 kg (257 lb 8 oz)  08/12/16 101.2 kg (223 lb)  05/28/16 104.3 kg (230 lb)     Exam  General: Somewhat restless and uncomfortable.  HEENT:    Neck: Supple, no JVD  Cardiovascular: S1 S2 clear, RRR  Respiratory: Decreased breath sounds at the bases   Gastrointestinal:   Ext:   Neuro  Skin: No rashes  Psych:  confused, restless    Data Reviewed:  I have personally reviewed following labs and imaging studies  Micro Results Recent Results (from the past 240 hour(s))  Culture, blood (routine x 2)     Status: None   Collection Time: 09/04/2016  4:26 PM  Result Value Ref Range Status   Specimen Description BLOOD RIGHT HAND  Final   Special Requests BOTTLES DRAWN AEROBIC AND ANAEROBIC 5CC  Final   Culture NO GROWTH 5 DAYS  Final   Report Status 09/17/2016 FINAL  Final  Culture, blood (routine x 2)     Status: None   Collection Time: 09/06/2016  6:08 PM  Result Value Ref Range Status   Specimen Description BLOOD LEFT HAND  Final   Special Requests BOTTLES DRAWN AEROBIC ONLY [place]  Final     Culture NO GROWTH 5 DAYS  Final   Report Status 09/17/2016 FINAL  Final  Urine culture     Status: None   Collection Time: 09/14/2016  6:43 PM  Result Value Ref Range Status   Specimen Description URINE, RANDOM  Final   Special Requests ADDED 0201 09/13/16  Final   Culture NO GROWTH  Final   Report Status 09/14/2016 FINAL  Final  Respiratory Panel by PCR     Status: None   Collection Time: 08/24/2016  9:13 PM  Result Value Ref Range Status   Adenovirus NOT DETECTED NOT DETECTED Final   Coronavirus 229E NOT DETECTED NOT DETECTED Final   Coronavirus HKU1 NOT DETECTED NOT DETECTED Final   Coronavirus NL63 NOT DETECTED NOT DETECTED Final   Coronavirus OC43 NOT DETECTED NOT DETECTED Final   Metapneumovirus NOT DETECTED NOT DETECTED Final   Rhinovirus / Enterovirus NOT DETECTED NOT DETECTED Final   Influenza A NOT DETECTED NOT DETECTED Final   Influenza B NOT DETECTED NOT DETECTED Final   Parainfluenza Virus 1 NOT DETECTED NOT DETECTED Final   Parainfluenza Virus 2 NOT DETECTED NOT DETECTED Final   Parainfluenza Virus 3 NOT DETECTED NOT DETECTED Final   Parainfluenza Virus 4 NOT DETECTED NOT DETECTED Final   Respiratory Syncytial Virus NOT DETECTED NOT DETECTED Final   Bordetella pertussis NOT DETECTED NOT DETECTED Final   Chlamydophila pneumoniae NOT DETECTED NOT DETECTED Final   Mycoplasma pneumoniae NOT DETECTED NOT DETECTED Final  MRSA PCR Screening     Status: None   Collection Time: 09/07/2016  9:13 PM  Result Value Ref Range Status   MRSA by PCR NEGATIVE NEGATIVE Final    Comment:        The GeneXpert MRSA Assay (FDA approved for NASAL specimens only), is one component of a comprehensive MRSA colonization surveillance program. It is not intended to diagnose MRSA infection nor to guide or monitor treatment for MRSA infections.     Radiology Reports Dg Chest Port 1 View  Result Date: 09/14/2016 CLINICAL DATA:  Initial evaluation for acute respiratory failure. EXAM:  PORTABLE CHEST 1 VIEW COMPARISON:  Prior radiograph from 09/13/2016. FINDINGS: Stable cardiomegaly. Mediastinal silhouette unchanged. Aortic atherosclerosis. Lungs are hypoinflated. Layering bilateral pleural effusions with associated bibasilar opacities, overall little interval change. Pulmonary vascular congestion stable. No pneumothorax. Osseous structures unchanged. IMPRESSION: 1. Shallow lung inflation with bilateral pleural effusions and associated bibasilar opacities, overall little interval changed from previous. 2. Stable cardiomegaly with similar pulmonary vascular congestion. Electronically Signed   By: Jeannine Boga M.D.   On: 09/14/2016 06:59   Dg Chest Port 1 View  Result Date: 09/13/2016 CLINICAL DATA:  Acute respiratory failure. EXAM: PORTABLE CHEST 1 VIEW COMPARISON:  08/21/2016 FINDINGS: Low lung volumes again seen. Bilateral pulmonary airspace disease with bibasilar predominance shows no significant change. Small bilateral pleural effusions cannot be excluded. Heart size is stable. Aortic atherosclerosis. IMPRESSION: Low lung volumes and diffuse bilateral airspace disease, without significant change. Electronically Signed   By: Earle Gell M.D.   On: 09/13/2016 07:36   Dg Chest Portable 1 View  Result Date: 08/21/2016 CLINICAL DATA:  Reason for exam: AMS, hypoxia. Medical hx: cancer, CHF, diabetes, HTN. EXAM: PORTABLE CHEST 1 VIEW COMPARISON:  09/07/2006 FINDINGS: There is mild enlargement of the cardiopericardial silhouette. No convincing mediastinal or hilar masses. Opacity at the left lung base obscuring hemidiaphragm and most of the left heart  border is stable. Opacity on the right extends from the right perihilar region to the right lung base, obscuring hemidiaphragm. This appears increased from the prior exam, although the difference may be due to the current semi-erect, rotated positioning compared to the prior exam. There is no convincing pulmonary edema.  No pneumothorax.  Skeletal structures are demineralized but grossly intact. IMPRESSION: 1. Moderate bilateral pleural effusions, greater on the left. There is associated lung base opacity that is likely atelectasis. Opacity on the left is stable from the prior exam. Opacity on the right may be increased although the difference may be positional only. 2. No convincing residual pulmonary edema. Electronically Signed   By: Lajean Manes M.D.   On: 08/26/2016 16:46    Lab Data:  CBC:  Recent Labs Lab 08/31/2016 1650 09/16/2016 2100 09/13/16 [id number] 09/14/16 0221 09/15/16 0256 09/16/16 0347  WBC 9.9 8.2 9.6 8.4 8.9 7.4  NEUTROABS 8.3*  --   --   --   --   --   HGB 8.4* 9.1* 8.4* 8.1* 7.5* 7.9*  HCT 28.2* 30.1* 28.4* 27.1* 24.6* 26.6*  MCV 87.6 87.5 87.1 88.0 86.9 87.2  PLT 111* 121* 107* 88* 83* 79*   Basic Metabolic Panel:  Recent Labs Lab 09/13/16 0208 09/13/16 0658 09/14/16 0221 09/15/16 0256 09/16/16 0347  NA 138 138 138 139 138  K 5.1 5.0 5.2* 4.8 4.8  CL 103 105 106 104 104  CO2 24 24 24 27 27   GLUCOSE 73 67 103* 117* 108*  BUN 64* 66* 65* 69* 72*  CREATININE 3.12* 3.01* 2.91* 3.19* 3.12*  CALCIUM 8.7* 8.7* 8.5* 8.5* 8.5*  MG  --  2.4 2.4  --   --   PHOS  --  6.1* 6.2*  --   --    GFR: Estimated Creatinine Clearance: 15.1 mL/min (by C-G formula based on SCr of 3.12 mg/dL (H)). Liver Function Tests:  Recent Labs Lab 08/31/2016 1650 09/15/16 0256 09/16/16 0347  AST 34 22 20  ALT 22 18 16   ALKPHOS 84 67 64  BILITOT 0.6 0.8 0.8  PROT 6.9 5.8* 6.2*  ALBUMIN 3.0* 2.7* 2.6*   No results for input(s): LIPASE, AMYLASE in the last 168 hours.  Recent Labs Lab 09/15/16 0256  AMMONIA 36*   Coagulation Profile: No results for input(s): INR, PROTIME in the last 168 hours. Cardiac Enzymes:  Recent Labs Lab 09/05/2016 1650 09/14/2016 2100 09/13/16 0208 09/13/16 0658  TROPONINI <0.03 0.03* 0.03* 0.04*   BNP (last 3 results) No results for input(s): PROBNP in the last 8760  hours. HbA1C:  Recent Labs  09/15/16 0256  HGBA1C 5.6   CBG:  Recent Labs Lab 09/15/16 1617 09/15/16 2147 09/16/16 0014 09/16/16 0442 09/16/16 0739  GLUCAP 111* 120* 123* 101* 109*   Lipid Profile: No results for input(s): CHOL, HDL, LDLCALC, TRIG, CHOLHDL, LDLDIRECT in the last 72 hours. Thyroid Function Tests: No results for input(s): TSH, T4TOTAL, FREET4, T3FREE, THYROIDAB in the last 72 hours. Anemia Panel:  Recent Labs  09/15/16 0256  VITAMINB12 824  FOLATE 11.3  FERRITIN 179  TIBC 211*  IRON 15*  RETICCTPCT 1.4   Urine analysis:    Component Value Date/Time   COLORURINE YELLOW 09/06/2016 1843   APPEARANCEUR CLEAR 09/14/2016 1843   LABSPEC 1.011 08/23/2016 1843   PHURINE 5.0 09/13/2016 1843   GLUCOSEU NEGATIVE 08/20/2016 1843   HGBUR NEGATIVE 08/26/2016 1843   BILIRUBINUR NEGATIVE 08/27/2016 1843   KETONESUR NEGATIVE 08/25/2016 1843   PROTEINUR 30 (  A) 09/06/2016 1843   UROBILINOGEN 0.2 09/11/2012 0033   NITRITE NEGATIVE 09/04/2016 1843   LEUKOCYTESUR NEGATIVE 08/24/2016 1843     RAI,RIPUDEEP M.D. [facility] 09/17/2016, 1:45 PM  Pager: [phone] Between 7am to 7pm - call Pager - [phone]  After 7pm go to [email or web address] - password [email or web address]  Call night coverage person covering after 7pm

## 2016-09-17 NOTE — Progress Notes (Signed)
Nutrition Brief Note  Chart reviewed. Pt now transitioning to comfort care.  No further nutrition interventions warranted at this time.  Please re-consult as needed.   Tyquisha Sharps A. Metzli Pollick, RD, LDN, CDE Pager: [phone] After hours Pager: [phone]  

## 2016-09-17 DEATH — deceased

## 2016-09-18 DIAGNOSIS — Z515 Encounter for palliative care: Secondary | ICD-10-CM

## 2016-09-18 DIAGNOSIS — Z66 Do not resuscitate: Secondary | ICD-10-CM

## 2016-09-18 DIAGNOSIS — R0600 Dyspnea, unspecified: Secondary | ICD-10-CM

## 2016-09-18 MED ORDER — MORPHINE SULFATE (PF) 2 MG/ML IV SOLN: 2.0000 mg | INTRAVENOUS | Status: DC | PRN

## 2016-09-18 MED ORDER — WHITE PETROLATUM GEL
Status: AC
  Administered 2016-09-18: 10:00:00
  Filled 2016-09-18: qty 1

## 2016-09-18 MED ORDER — LORAZEPAM 2 MG/ML IJ SOLN: 1.0000 mg | INTRAMUSCULAR | Status: DC | PRN

## 2016-09-18 MED ORDER — ONDANSETRON HCL 4 MG/2ML IJ SOLN: 4.0000 mg | Freq: Four times a day (QID) | INTRAMUSCULAR | Status: DC | PRN

## 2016-09-18 MED ORDER — SODIUM CHLORIDE 0.9 % IV SOLN: 3.0000 mg/h | INTRAVENOUS | Status: DC

## 2016-10-18 NOTE — Consult Note (Signed)
[facility] CM Primary Care Navigator  08-Oct-2016  [place] Nov 25, 1945 [id number]  Martin Majestic to see patient and at the bedside and met with family members to identify possible discharge needs. Patient is unresponsive and transitioning to end of life. Palliative Medicine Team is currently assisting in the care of patient.   Discharge plan:  Anticipated Hospital Death.   For additional questions please contact:  Edwena Felty A. Raeana Blinn, BSN, RN-BC [facility] PRIMARY CARE Navigator Cell: [phone]

## 2016-10-18 NOTE — Progress Notes (Signed)
Pt expired at this time. Witnessed by Larina Bras RN and Daria Pastures RN, Dr. Tana Coast notified and [place] [facility] notified. Referral number is [id number]

## 2016-10-18 NOTE — Care Management Important Message (Signed)
Important Message  Patient Details  Name: [name] MRN: [id number] Date of Birth: [birth year]   Medicare Important Message Given:  Yes    Marco Adelson Montine Circle 10/12/2016, 3:59 PM

## 2016-10-18 NOTE — Care Management Important Message (Signed)
Important Message  Patient Details  Name: [name] MRN: [id number] Date of Birth: 1946-02-25   Medicare Important Message Given:  Yes    Abdinasir Spadafore [address] 10/09/2016, 4:07 PM

## 2016-10-18 NOTE — Death Summary Note (Addendum)
DEATH SUMMARY   Patient Details  Name: [name] MRN: [id number] DOB: November 08, 1945  Admission/Discharge Information   Admit Date:  10-04-2016  Date of Death: Date of Death: 10-10-2016  Time of Death: Time of Death: 1321-11-08  Length of Stay: 15-Oct-2022  Referring Physician: Marco Collie, MD   Reason(s) for Hospitalization    Diagnoses  Preliminary cause of death: Acute on chronic respiratory failure ([place])   Secondary Diagnoses (including complications and co-morbidities):     Acute On chronic hypercarbic and hypoxemic respiratory failure ([place])  Acute exacerbation of diastolic heart failure   AKI (acute kidney injury) on chronic kidney disease stage III ([place])   HCAP (healthcare-associated pneumonia)   Benign essential hypertension  Diabetes mellitus    History of Pulmonary hypertension   Brief Hospital Course (including significant findings, care, treatment, and services provided and events leading to death)   71 year old with history of CHF, DM, OSA/OHS, B LE amputations, and pulmonary HTN who was sent from hernursing home with O2 sats 68%. Workup in the ED was significant for ABG 7.22/61/60. Creatinine of 3.09 up from baseline of 1.8, K 5.7, hemoglobin 8.4, platelets 111. She was previously admitted to [facility] for pneumonia and UTI and discharged on February 14 to a SNF. The family noted increasing stump edema and increase in weight by 20 lbs over a few weeks.   Hospital course  Acute on chronic hypercarbic, hypoxemic respiratory failure due to History of Pulmonary HTN and Acute exacerbation of Diastolic heart failure  Dry weight appears to be as low as 101kg - EF 55-60% w/ grade 2 DD as as of Sept 2016  - 2-D echo this admission showed EF of 55-60% - Initially admitted by critical care service on 04-Oct-2022, Patient was placed on BiPAP, treated with antibiotics, gentle diuresis. - She initially showed some improvement however on 2/27 became unresponsive with hypercarbia and  respiratory acidosis despite being on BiPAP settings 18/6.  - Influenza negative, she was placed on antibiotics to cover for HCAP.  - Due to diuresis, creatinine continued to worsen. - Pulmonary critical care discussed with patient's family in detail regarding poor prognosis and per patient's wishes, patient had not wanted intubation or CPR or hemodialysis. Family requested comfort care. - On 2/28, patient was titrated to NRB. Patient was transferred to [facility] service. -Palliative medicine was also consulted. Patient was started on symptom management with morphine, Haldol and Robinul. Patient was placed on comfort care goals per patient's family's wishes.   -She was placed on morphine drip on 09/17/16, patient expired on 2016-10-10 at 1:23 PM with her multiple family members around her.   OSA/OHS on BiPAP  Recent Enterobacter UTI It should be noted that UA at time of this admit, as well as urine culture, were both unremarkable  AKI on CKD stage III  Patient was placed on comfort care goals  HTN - Comfort care goals   DM - on comfort care goals   Acute metabolic Encephalopathy likely secondary to hypercarbia.   Pertinent Labs and Studies  Significant Diagnostic Studies Dg Chest Port 1 View  Result Date: 09/14/2016 CLINICAL DATA:  Initial evaluation for acute respiratory failure. EXAM: PORTABLE CHEST 1 VIEW COMPARISON:  Prior radiograph from 09/13/2016. FINDINGS: Stable cardiomegaly. Mediastinal silhouette unchanged. Aortic atherosclerosis. Lungs are hypoinflated. Layering bilateral pleural effusions with associated bibasilar opacities, overall little interval change. Pulmonary vascular congestion stable. No pneumothorax. Osseous structures unchanged. IMPRESSION: 1. Shallow lung inflation with bilateral pleural effusions and associated bibasilar opacities,  overall little interval changed from previous. 2. Stable cardiomegaly with similar pulmonary vascular congestion.  Electronically Signed   By: Jeannine Boga M.D.   On: 09/14/2016 06:59   Dg Chest Port 1 View  Result Date: 09/13/2016 CLINICAL DATA:  Acute respiratory failure. EXAM: PORTABLE CHEST 1 VIEW COMPARISON:  09/15/2016 FINDINGS: Low lung volumes again seen. Bilateral pulmonary airspace disease with bibasilar predominance shows no significant change. Small bilateral pleural effusions cannot be excluded. Heart size is stable. Aortic atherosclerosis. IMPRESSION: Low lung volumes and diffuse bilateral airspace disease, without significant change. Electronically Signed   By: Earle Gell M.D.   On: 09/13/2016 07:36   Dg Chest Portable 1 View  Result Date: 09/13/2016 CLINICAL DATA:  Reason for exam: AMS, hypoxia. Medical hx: cancer, CHF, diabetes, HTN. EXAM: PORTABLE CHEST 1 VIEW COMPARISON:  09/07/2006 FINDINGS: There is mild enlargement of the cardiopericardial silhouette. No convincing mediastinal or hilar masses. Opacity at the left lung base obscuring hemidiaphragm and most of the left heart border is stable. Opacity on the right extends from the right perihilar region to the right lung base, obscuring hemidiaphragm. This appears increased from the prior exam, although the difference may be due to the current semi-erect, rotated positioning compared to the prior exam. There is no convincing pulmonary edema.  No pneumothorax. Skeletal structures are demineralized but grossly intact. IMPRESSION: 1. Moderate bilateral pleural effusions, greater on the left. There is associated lung base opacity that is likely atelectasis. Opacity on the left is stable from the prior exam. Opacity on the right may be increased although the difference may be positional only. 2. No convincing residual pulmonary edema. Electronically Signed   By: Lajean Manes M.D.   On: 08/28/2016 16:46    Microbiology Recent Results (from the past 240 hour(s))  Culture, blood (routine x 2)     Status: None   Collection Time: 08/30/2016  4:26  PM  Result Value Ref Range Status   Specimen Description BLOOD RIGHT HAND  Final   Special Requests BOTTLES DRAWN AEROBIC AND ANAEROBIC 5CC  Final   Culture NO GROWTH 5 DAYS  Final   Report Status 09/17/2016 FINAL  Final  Culture, blood (routine x 2)     Status: None   Collection Time: 08/27/2016  6:08 PM  Result Value Ref Range Status   Specimen Description BLOOD LEFT HAND  Final   Special Requests BOTTLES DRAWN AEROBIC ONLY [place]  Final   Culture NO GROWTH 5 DAYS  Final   Report Status 09/17/2016 FINAL  Final  Urine culture     Status: None   Collection Time: 09/04/2016  6:43 PM  Result Value Ref Range Status   Specimen Description URINE, RANDOM  Final   Special Requests ADDED 0201 09/13/16  Final   Culture NO GROWTH  Final   Report Status 09/14/2016 FINAL  Final  Respiratory Panel by PCR     Status: None   Collection Time: 08/20/2016  9:13 PM  Result Value Ref Range Status   Adenovirus NOT DETECTED NOT DETECTED Final   Coronavirus 229E NOT DETECTED NOT DETECTED Final   Coronavirus HKU1 NOT DETECTED NOT DETECTED Final   Coronavirus NL63 NOT DETECTED NOT DETECTED Final   Coronavirus OC43 NOT DETECTED NOT DETECTED Final   Metapneumovirus NOT DETECTED NOT DETECTED Final   Rhinovirus / Enterovirus NOT DETECTED NOT DETECTED Final   Influenza A NOT DETECTED NOT DETECTED Final   Influenza B NOT DETECTED NOT DETECTED Final   Parainfluenza Virus 1  NOT DETECTED NOT DETECTED Final   Parainfluenza Virus 2 NOT DETECTED NOT DETECTED Final   Parainfluenza Virus 3 NOT DETECTED NOT DETECTED Final   Parainfluenza Virus 4 NOT DETECTED NOT DETECTED Final   Respiratory Syncytial Virus NOT DETECTED NOT DETECTED Final   Bordetella pertussis NOT DETECTED NOT DETECTED Final   Chlamydophila pneumoniae NOT DETECTED NOT DETECTED Final   Mycoplasma pneumoniae NOT DETECTED NOT DETECTED Final  MRSA PCR Screening     Status: None   Collection Time: 09/05/2016  9:13 PM  Result Value Ref Range Status   MRSA by PCR  NEGATIVE NEGATIVE Final    Comment:        The GeneXpert MRSA Assay (FDA approved for NASAL specimens only), is one component of a comprehensive MRSA colonization surveillance program. It is not intended to diagnose MRSA infection nor to guide or monitor treatment for MRSA infections.     Lab Basic Metabolic Panel:  Recent Labs Lab 09/13/16 0208 09/13/16 0658 09/14/16 0221 09/15/16 0256 09/16/16 0347  NA 138 138 138 139 138  K 5.1 5.0 5.2* 4.8 4.8  CL 103 105 106 104 104  CO2 24 24 24 27 27   GLUCOSE 73 67 103* 117* 108*  BUN 64* 66* 65* 69* 72*  CREATININE 3.12* 3.01* 2.91* 3.19* 3.12*  CALCIUM 8.7* 8.7* 8.5* 8.5* 8.5*  MG  --  2.4 2.4  --   --   PHOS  --  6.1* 6.2*  --   --    Liver Function Tests:  Recent Labs Lab 09/10/2016 1650 09/15/16 0256 09/16/16 0347  AST 34 22 20  ALT 22 18 16   ALKPHOS 84 67 64  BILITOT 0.6 0.8 0.8  PROT 6.9 5.8* 6.2*  ALBUMIN 3.0* 2.7* 2.6*   No results for input(s): LIPASE, AMYLASE in the last 168 hours.  Recent Labs Lab 09/15/16 0256  AMMONIA 36*   CBC:  Recent Labs Lab 09/09/2016 1650 08/23/2016 2100 09/13/16 0658 09/14/16 0221 09/15/16 0256 09/16/16 0347  WBC 9.9 8.2 9.6 8.4 8.9 7.4  NEUTROABS 8.3*  --   --   --   --   --   HGB 8.4* 9.1* 8.4* 8.1* 7.5* 7.9*  HCT 28.2* 30.1* 28.4* 27.1* 24.6* 26.6*  MCV 87.6 87.5 87.1 88.0 86.9 87.2  PLT 111* 121* 107* 88* 83* 79*   Cardiac Enzymes:  Recent Labs Lab 09/02/2016 1650 09/16/2016 2100 09/13/16 0208 09/13/16 0658  TROPONINI <0.03 0.03* 0.03* 0.04*   Sepsis Labs:  Recent Labs Lab 08/31/2016 1815 08/21/2016 2100 09/08/2016 2217 09/13/16 0658 09/14/16 0221 09/15/16 0256 09/16/16 0347  PROCALCITON  --  <0.10  --   --   --   --   --   WBC  --  8.2  --  9.6 8.4 8.9 7.4  LATICACIDVEN 0.39* 0.7 0.6  --   --   --   --     Procedures/Operations      RAI,RIPUDEEP 10-05-16, 2:39 PM

## 2016-10-18 NOTE — Progress Notes (Signed)
Responded to page from nurse after pt suddenly died. Provided emotional/spiritual/grief support to the many family members in the rm, and prayer -- which they much appreciated. They said their pastor was on the way. Chaplain available for f/u.   09-26-2016 1400  Clinical Encounter Type  Visited With Family  Visit Type Initial;Psychological support;Spiritual support;Social support;Death  Referral From Nurse  Spiritual Encounters  Spiritual Needs Prayer;Emotional;Grief support  Stress Factors  Family Stress Factors Loss   Gerrit Heck, Chaplain

## 2016-10-18 NOTE — Progress Notes (Signed)
Palliative Medicine RN Note: Rec'd call that pt has required changes to morphine dosage since yesterday.   Pt is gasping but labored. She is having pauses of 6-10 seconds between breaths. She is currently on 3 mg/hour continuous infusion of morphine. Family describes overnight behavior of moving her arms and being agitated; no benzos in the orders. Pt did have some morphine rate changes this morning and has gotten some prn doses.   Discussed pt's symptoms and presentation with her family, RN Precious Bard and Dr Hilma Favors. New orders for ativan and other medication adjustments noted.   Patient's dog is visiting now, and she has one more grandchild coming to visit this afternoon. I will come back later today to ensure patient remains comfortable. She is not stable for transport to any hospice facility at this time.   Marjie Skiff Wadsworth Skolnick, RN, BSN, [facility] Sep 21, 2016 12:59 PM Cell [phone] 8:00-4:00 Monday-Friday Office [phone]

## 2016-10-18 NOTE — Progress Notes (Signed)
Palliative Medicine RN Note: Called to room by RN Precious Bard; pt is at extreme EOL. Entered the room to find pt's family around her bed with dog laying next to her. RR around 2. I stayed with family until pt ceased breathing. No pulse or respirations noted at 1323. Reported to [employer]. Called Amy in [place]; she will send a chaplain. Family is waiting on other family members to arrive.   Precious Bard knows how to get PMT if needed.  Marjie Skiff Yael Angerer, RN, BSN, [facility] September 28, 2016 2:06 PM Cell [phone] 8:00-4:00 Monday-Friday Office [phone]

## 2016-10-18 NOTE — Progress Notes (Signed)
Morphine 291ml wasted. Witnessed by [employer]

## 2016-10-18 DEATH — deceased

## 2016-11-10 ENCOUNTER — Ambulatory Visit: Payer: Medicare Other | Admitting: Pulmonary Disease
